# Patient Record
Sex: Female | Born: 1945 | Race: White | Hispanic: No | Marital: Married | Smoking: Never smoker
Health system: Southern US, Academic
[De-identification: ages and names within clinical notes are randomized; demographics above are authoritative.]

## PROBLEM LIST (undated history)

## (undated) ENCOUNTER — Ambulatory Visit: Admission: RE | Admitting: Gastroenterology

## (undated) ENCOUNTER — Ambulatory Visit (HOSPITAL_COMMUNITY): Payer: Self-pay | Admitting: Emergency Medicine

## (undated) ENCOUNTER — Encounter (HOSPITAL_COMMUNITY): Admission: RE | Payer: Self-pay

## (undated) ENCOUNTER — Encounter (HOSPITAL_COMMUNITY): Payer: Self-pay

## (undated) ENCOUNTER — Inpatient Hospital Stay (HOSPITAL_COMMUNITY): Admission: RE | Payer: Medicare PPO | Admitting: Student in an Organized Health Care Education/Training Program

## (undated) DIAGNOSIS — M353 Polymyalgia rheumatica: Secondary | ICD-10-CM

## (undated) DIAGNOSIS — H269 Unspecified cataract: Secondary | ICD-10-CM

## (undated) DIAGNOSIS — J45909 Unspecified asthma, uncomplicated: Secondary | ICD-10-CM

## (undated) DIAGNOSIS — I509 Heart failure, unspecified: Secondary | ICD-10-CM

## (undated) DIAGNOSIS — M545 Low back pain, unspecified: Secondary | ICD-10-CM

## (undated) DIAGNOSIS — K449 Diaphragmatic hernia without obstruction or gangrene: Secondary | ICD-10-CM

## (undated) DIAGNOSIS — E119 Type 2 diabetes mellitus without complications: Secondary | ICD-10-CM

## (undated) DIAGNOSIS — F40240 Claustrophobia: Secondary | ICD-10-CM

## (undated) DIAGNOSIS — J449 Chronic obstructive pulmonary disease, unspecified: Secondary | ICD-10-CM

## (undated) DIAGNOSIS — I2510 Atherosclerotic heart disease of native coronary artery without angina pectoris: Secondary | ICD-10-CM

## (undated) DIAGNOSIS — I10 Essential (primary) hypertension: Secondary | ICD-10-CM

## (undated) DIAGNOSIS — M539 Dorsopathy, unspecified: Secondary | ICD-10-CM

## (undated) DIAGNOSIS — Z87448 Personal history of other diseases of urinary system: Secondary | ICD-10-CM

## (undated) DIAGNOSIS — E7800 Pure hypercholesterolemia, unspecified: Secondary | ICD-10-CM

## (undated) DIAGNOSIS — M1990 Unspecified osteoarthritis, unspecified site: Secondary | ICD-10-CM

## (undated) DIAGNOSIS — K219 Gastro-esophageal reflux disease without esophagitis: Secondary | ICD-10-CM

## (undated) DIAGNOSIS — F32A Depression, unspecified: Secondary | ICD-10-CM

## (undated) DIAGNOSIS — G8929 Other chronic pain: Secondary | ICD-10-CM

## (undated) DIAGNOSIS — F329 Major depressive disorder, single episode, unspecified: Secondary | ICD-10-CM

## (undated) DIAGNOSIS — J4489 Other specified chronic obstructive pulmonary disease: Secondary | ICD-10-CM

## (undated) DIAGNOSIS — E781 Pure hyperglyceridemia: Secondary | ICD-10-CM

## (undated) DIAGNOSIS — Z87440 Personal history of urinary (tract) infections: Secondary | ICD-10-CM

## (undated) HISTORY — DX: Pure hypercholesterolemia, unspecified: E78.00

## (undated) HISTORY — DX: Claustrophobia: F40.240

## (undated) HISTORY — DX: Other specified chronic obstructive pulmonary disease: J44.89

## (undated) HISTORY — PX: ESOPHAGOGASTRODUODENOSCOPY: SHX1529

## (undated) HISTORY — DX: Low back pain, unspecified: M54.50

## (undated) HISTORY — PX: HX HYSTERECTOMY: SHX81

## (undated) HISTORY — DX: Unspecified cataract: H26.9

## (undated) HISTORY — PX: COLONOSCOPY: WVUENDOPRO10

## (undated) HISTORY — DX: Essential (primary) hypertension: I10

## (undated) HISTORY — PX: HX CARPAL TUNNEL RELEASE: SHX101

## (undated) HISTORY — DX: Heart failure, unspecified: I50.9

## (undated) HISTORY — DX: Other chronic pain: G89.29

## (undated) HISTORY — PX: HX GALL BLADDER SURGERY/CHOLE: SHX55

## (undated) HISTORY — DX: Atherosclerotic heart disease of native coronary artery without angina pectoris: I25.10

## (undated) HISTORY — PX: HX TUBAL LIGATION: SHX77

## (undated) HISTORY — DX: Pure hyperglyceridemia: E78.1

## (undated) HISTORY — PX: HX EXPOSURE TO METAL SHAVINGS: 2100001401

## (undated) HISTORY — DX: Chronic obstructive pulmonary disease, unspecified (CMS HCC): J44.9

## (undated) HISTORY — DX: Type 2 diabetes mellitus without complications: E11.9

## (undated) HISTORY — PX: CATARACT EXTRACTION, BILATERAL: SHX1313

## (undated) HISTORY — PX: HX HEART CATHETERIZATION: SHX148

## (undated) HISTORY — PX: HX TAH AND BSO: SHX83

## (undated) HISTORY — PX: HX OOPHORECTOMY: SHX86

## (undated) HISTORY — PX: HX VEIN STRIPPING: SHX48

## (undated) SURGERY — COLONOSCOPY WITH HOT BIOPSY
Anesthesia: Monitor Anesthesia Care

## (undated) SURGERY — PAIN SERVICE BLOCK INJECTION CAUDAL EPIDURAL WITH IMAGING
Anesthesia: Local (Nurse-Monitored) | Laterality: Right

## (undated) SURGERY — ENDOSCOPIC U/S UPPER
Anesthesia: Monitor Anesthesia Care

---
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
## 2004-01-14 ENCOUNTER — Inpatient Hospital Stay (HOSPITAL_COMMUNITY): Payer: Self-pay

## 2004-01-16 ENCOUNTER — Encounter (FREE_STANDING_LABORATORY_FACILITY): Payer: Self-pay | Admitting: Pathology

## 2011-10-28 ENCOUNTER — Telehealth (INDEPENDENT_AMBULATORY_CARE_PROVIDER_SITE_OTHER): Payer: Self-pay | Admitting: Family Medicine

## 2011-10-28 NOTE — Telephone Encounter (Signed)
She wants to go see Sherrell Puller for her feet [phone] he is at [employer]

## 2012-02-18 ENCOUNTER — Ambulatory Visit (HOSPITAL_COMMUNITY): Payer: Self-pay

## 2012-07-04 IMAGING — CR DG CHEST 2V
2 series · 2 of 2 positions shown · non-contrast
Comparison: None.

CLINICAL DATA: Cough for 2 days with difficulty breathing.

CHEST - 2 VIEW

[PA]
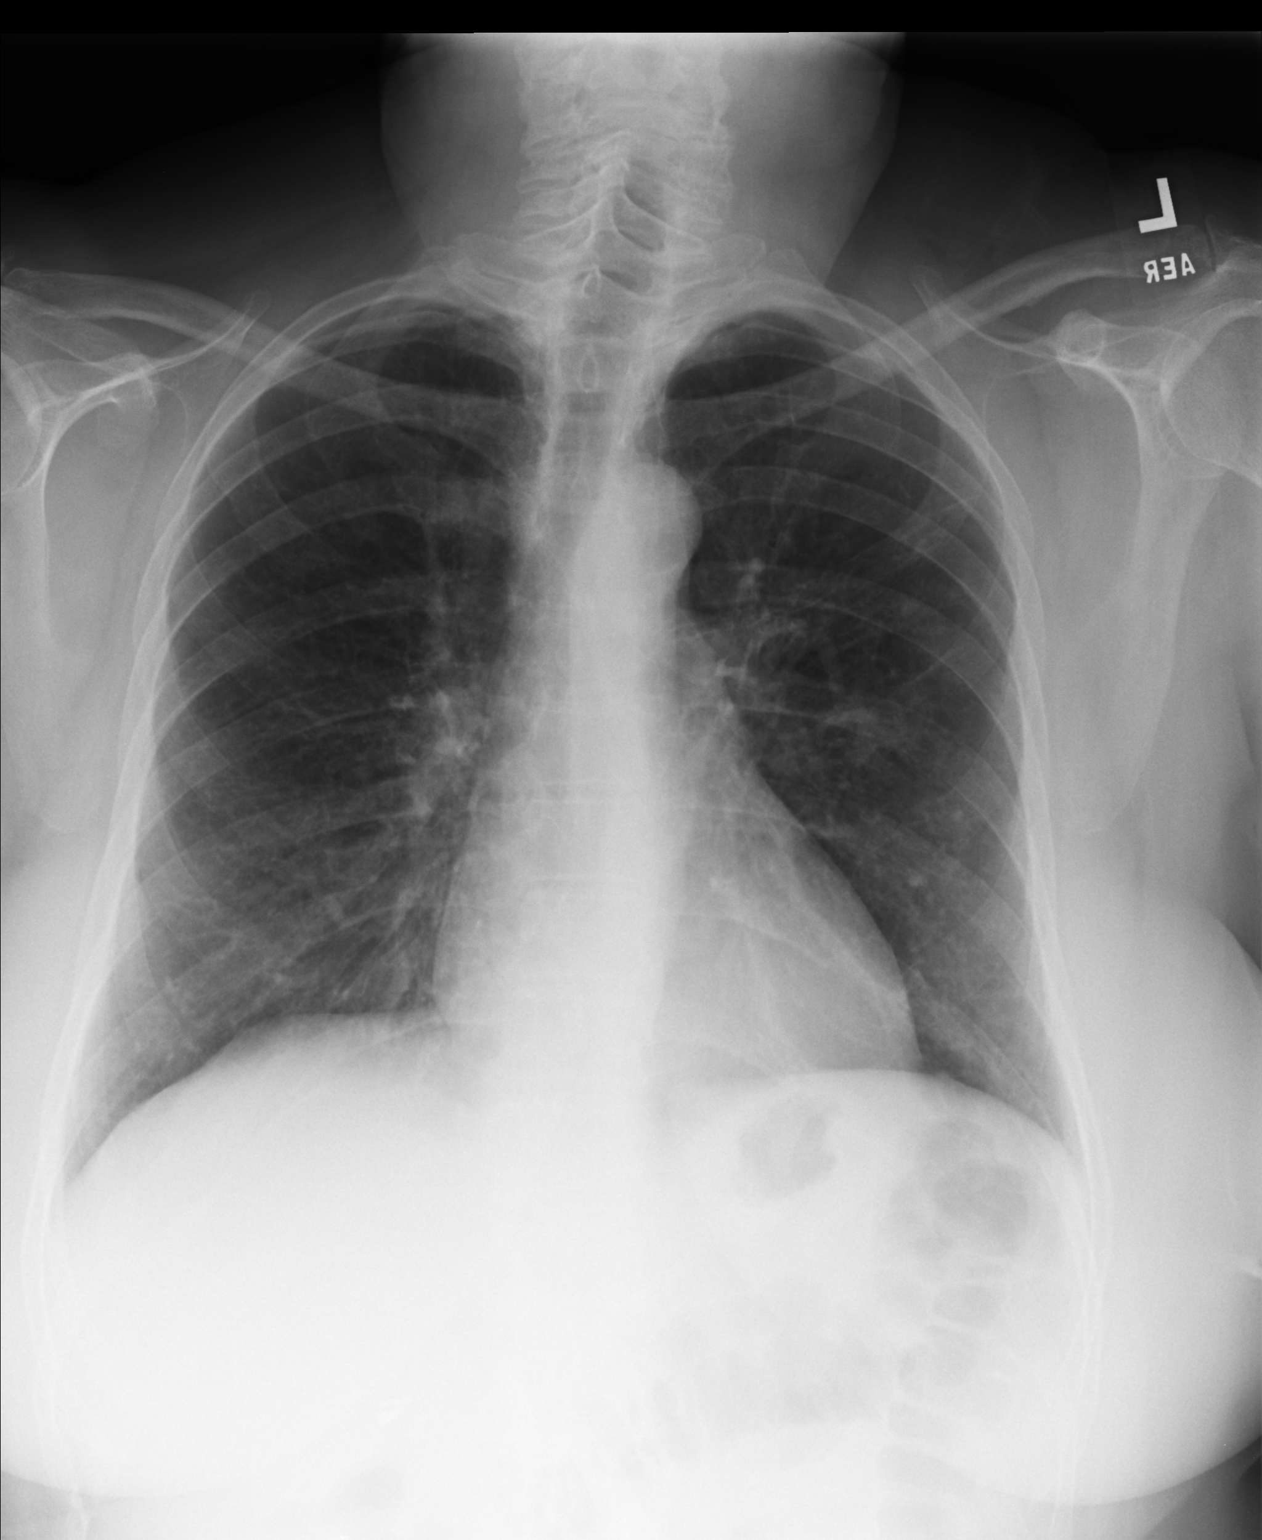

[lateral]
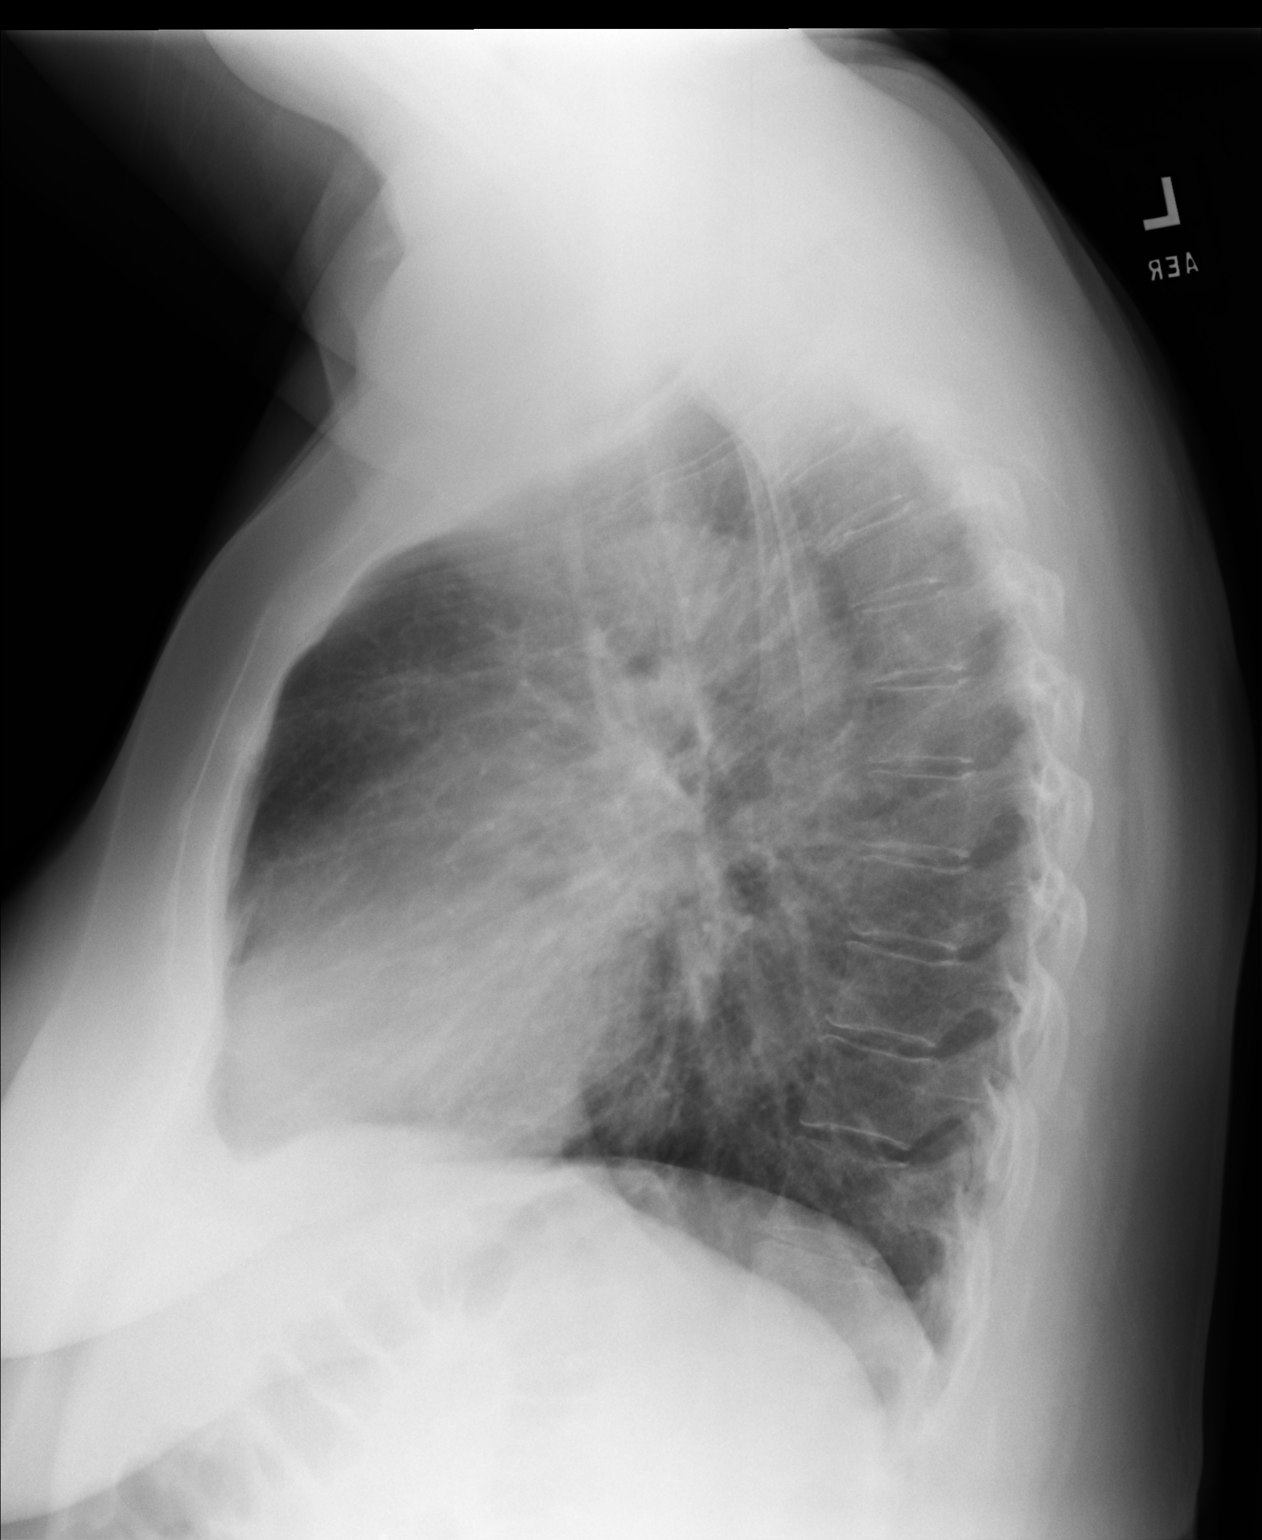

[2 of 2 positions shown; findings below may reference images not displayed]

FINDINGS: The heart size and mediastinal contours are normal.
There is a questionable ill-defined nodular density in the left
perihilar region, only seen on the frontal examination.  The right
lung is clear.  There is no pleural effusion.  The osseous
structures appear normal.
IMPRESSION: Questionable nodular density in the left perihilar region could be
due to an overlap of vascular and osseous structures.  However, a
focal infiltrate or ill-defined nodule cannot be excluded.

Short-term radiographic followup is recommended to assess for the
persistence of this finding.

This finding was not noted on the preliminary report by Dr. Manou.
These results will be called to the ordering clinician or
representative by the Radiologist Assistant, and communication
documented in the PACS Dashboard.

## 2014-08-24 ENCOUNTER — Encounter (INDEPENDENT_AMBULATORY_CARE_PROVIDER_SITE_OTHER): Payer: Self-pay | Admitting: Neurological Surgery

## 2014-08-24 ENCOUNTER — Ambulatory Visit (INDEPENDENT_AMBULATORY_CARE_PROVIDER_SITE_OTHER): Payer: BC Managed Care – PPO | Admitting: Neurological Surgery

## 2014-08-24 VITALS — BP 118/64 | HR 68 | Temp 97.7°F | Resp 16 | Ht 65.0 in | Wt 210.8 lb

## 2014-08-24 DIAGNOSIS — M48061 Spinal stenosis, lumbar region without neurogenic claudication: Principal | ICD-10-CM

## 2014-08-24 NOTE — H&P (Addendum)
[facility] of Neurosurgery  New Outpatient/Consult    Percell Locus  Date of Service: 08/24/2014  Referring physician: Jobe Marker, DO  [facility]  [address]  [place], [place] [address]      Chief Complaint:   Chief Complaint   Patient presents with    Lower Back Pain    Right Leg Pain     History is provided by patient    History of Present Illness  [name] is a pleasant 68 y.o. female with a past medical history of COPD, HLD, carpal tunnel release and h/o left leg vein stripping who presents today with complaints of chronic lumbar pain for 25 years. She was also having radicular pain in her right lower extremity in a L4 distribution more recently which prompted the MRI. She states since obtaining the MRI in July 2015, her pain has improved. She reports minimal low back pain today. Reports occasional leg pain at night R > L.  Denies any current radicular symptoms. Prior to July her symptoms were consistent with neurogenic claudication. She gives a remote history of PT and 1 injection in her spine at the [facility] clinic 25 years ago. None since. Pertinent negatives include bowel/bladder incontinence, saddle anesthesia and gait instability.     Past History  Current Outpatient Prescriptions   Medication Sig    FLUoxetine (PROZAC) 20 mg Oral Capsule Take 20 mg by mouth Once a day    fluticasone-salmeterol (ADVAIR DISKUS) 500-50 mcg/dose Inhalation Disk with Device oral diskus inhaler Take 1 INHALATION by inhalation Twice daily    OMEPRAZOLE ORAL Take by mouth    Pravastatin (PRAVACHOL) 20 mg Oral Tablet Take 20 mg by mouth Every evening    Prednisone 5 mg Oral Tablets, Dose Pack Take by mouth    UBIDECARENONE (COQ-10 ORAL) Take by mouth     No Known Allergies  Past Medical History   Diagnosis Date    Chronic bronchitis with emphysema     Claustrophobia      Past Surgical History   Procedure Laterality Date    Hx hysterectomy      Hx carpal tunnel release      Hx tubal  ligation      Hx gall bladder surgery/chole      Hx vein stripping       Left leg    Hx exposure to metal shavings         Family History  Family History:  Family History   Problem Relation Age of Onset    Diabetes Multiple family members     Hypertension Multiple family members            Social History    History     Social History    Marital Status: Married     Spouse Name: N/A     Number of Children: N/A    Years of Education: N/A     Social History Main Topics    Smoking status: Never Smoker     Smokeless tobacco: Never Used    Alcohol Use: No    Drug Use: No    Sexual Activity: Not on file     Other Topics Concern    Right Hand Dominant Yes     Social History Narrative       Review of Systems:  +LBP. Negative for myalgias, headaches, rhinorrhea, fever, weight loss, chest pain, shortness of breath,syncope, abdominal pain, bowel or bladder incontinence, weakness and gait disturbance.  All other systems reviewed are negative.     Physical Examination:   BP 118/64 mmHg   Pulse 68   Temp(Src) 36.5 C (97.7 F) (Tympanic)   Resp 16   Ht 1.651 m ( )   Wt 95.618 kg (210 lb 12.8 oz)   BMI 35.08 kg/m2  General: Resting in a seated position, in no acute distress. No pain behavior, symptom magnification, or drug seeking behavior. Speech is fluent and affect is normal.   Skin: No abnormalities of the skin. It is not clammy, cyanotic, mottled, atrophic or red.   HEENT: Normocephalic, atraumatic. PERRLA. EOMI. Sclera non-icteric. Ears appear normal.Oropharynx is clear.   Neck: No lesions, no JVD, no pathology.   Cardiovascular:  Radial pulses are +2 and symmetric.   Respiratory: Symmetric rise and fall of chest. No accessory muscle use.   Abdomen: Normoactive bowel sounds x4. Soft, non-tender, non-distended.   Musculoskeletal: Negative Patrick's sign bilaterally. Negative SLR bilaterally. No focal muscle atrophy. Negative Tinel's sign.  Negative Lhermitte's sign. + greater trochanteric tenderness on the  right.   Neuro: Alert &  Oriented x 4. Cranial nerves 2-12 are grossly intact.  Motor examination is 5/5 in bilateral upper extremities and 5/5 in bilateral lower extremities.  DTR are 2+ and symmetric in upper and lower extremities with downgoing toes. No clonus. Negative Hoffman bilaterally.  Gait is normal.  Sensation is intact.     Imaging:  I reviewed the film myself and the radiology report, my interpretation is as follows: MRI Lumbar 07/12/14- marked degenerative stenosis at L4-L5 with grade 1 spondylolisthesis. Mild left lateral recess stenosis at L5-S1 but no severe central canal stenosis at that level.     Assessment:    ICD-9-CM   1. 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 Severe stenosis at L4-L5 but no current symptoms of neurogenic claudication.     2. ? Right greater trochanteric bursitis as well. She has tenderness over right greater trochanter.     Plan:  Patient can follow up as needed. Her lumbar pain has improved. Her symptoms of neurogenic claudication have improved. She will call for follow up.       Patient was evaluated with Dr. Idolina Primer whom recommends the plan above.    Domingo Pulse, PA 08/24/2014, 16:32    I personally saw and evaluated the patient. See mid-level's note for additional details. My findings/particpation are doing well at this time. Follow-up prn.    Debbra Riding, MD 09/12/2014, 10:52

## 2016-02-20 DIAGNOSIS — J9601 Acute respiratory failure with hypoxia: Secondary | ICD-10-CM | POA: Insufficient documentation

## 2016-02-20 DIAGNOSIS — K219 Gastro-esophageal reflux disease without esophagitis: Secondary | ICD-10-CM | POA: Insufficient documentation

## 2016-02-20 DIAGNOSIS — E669 Obesity, unspecified: Secondary | ICD-10-CM | POA: Insufficient documentation

## 2016-02-20 DIAGNOSIS — I509 Heart failure, unspecified: Secondary | ICD-10-CM | POA: Insufficient documentation

## 2016-03-21 DIAGNOSIS — I420 Dilated cardiomyopathy: Secondary | ICD-10-CM | POA: Insufficient documentation

## 2016-03-21 DIAGNOSIS — I5022 Chronic systolic (congestive) heart failure: Secondary | ICD-10-CM | POA: Insufficient documentation

## 2017-12-08 ENCOUNTER — Ambulatory Visit (HOSPITAL_COMMUNITY): Payer: Self-pay | Admitting: Physician Assistant

## 2018-06-09 ENCOUNTER — Other Ambulatory Visit (INDEPENDENT_AMBULATORY_CARE_PROVIDER_SITE_OTHER): Payer: Self-pay | Admitting: Family Medicine

## 2018-06-19 ENCOUNTER — Encounter (HOSPITAL_COMMUNITY): Payer: Self-pay | Admitting: Family Medicine

## 2018-06-19 DIAGNOSIS — M545 Low back pain, unspecified: Secondary | ICD-10-CM

## 2018-06-19 DIAGNOSIS — G8929 Other chronic pain: Secondary | ICD-10-CM | POA: Insufficient documentation

## 2018-06-19 DIAGNOSIS — I509 Heart failure, unspecified: Secondary | ICD-10-CM

## 2018-06-19 DIAGNOSIS — I2510 Atherosclerotic heart disease of native coronary artery without angina pectoris: Secondary | ICD-10-CM

## 2018-06-19 DIAGNOSIS — N1831 Chronic kidney disease, stage 3a (CMS HCC): Secondary | ICD-10-CM | POA: Insufficient documentation

## 2018-06-19 DIAGNOSIS — E119 Type 2 diabetes mellitus without complications: Secondary | ICD-10-CM

## 2018-06-19 DIAGNOSIS — E781 Pure hyperglyceridemia: Secondary | ICD-10-CM

## 2018-06-21 ENCOUNTER — Other Ambulatory Visit (INDEPENDENT_AMBULATORY_CARE_PROVIDER_SITE_OTHER): Payer: Self-pay | Admitting: Family Medicine

## 2018-06-23 ENCOUNTER — Encounter (INDEPENDENT_AMBULATORY_CARE_PROVIDER_SITE_OTHER): Payer: Self-pay | Admitting: Family

## 2018-06-23 ENCOUNTER — Ambulatory Visit: Payer: Medicare PPO | Attending: Family | Admitting: Family

## 2018-06-23 VITALS — BP 124/62 | HR 82 | Temp 98.0°F | Resp 18 | Ht 65.0 in | Wt 196.0 lb

## 2018-06-23 DIAGNOSIS — R300 Dysuria: Secondary | ICD-10-CM

## 2018-06-23 DIAGNOSIS — N390 Urinary tract infection, site not specified: Secondary | ICD-10-CM | POA: Insufficient documentation

## 2018-06-23 MED ORDER — PHENAZOPYRIDINE 200 MG TABLET
200.0000 mg | ORAL_TABLET | Freq: Two times a day (BID) | ORAL | 0 refills | Status: DC | PRN
Start: 2018-06-23 — End: 2018-07-28

## 2018-06-23 MED ORDER — CIPROFLOXACIN 500 MG TABLET
500.0000 mg | ORAL_TABLET | Freq: Two times a day (BID) | ORAL | 0 refills | Status: AC
Start: 2018-06-23 — End: 2018-07-03

## 2018-06-23 NOTE — Progress Notes (Signed)
[facility]  [address]  [place], [place] [address]  P: [phone]     Name: Joanna Black MRN:  [id number]   Date: 06/23/2018 Age: 72 y.o.      Chief Complaint: Frequent Urination (complaints of burning with urination, frequency) and Pain on Urination    History of Present Illness  C/o frequency, dysuria, and oliguria with voiding; s/s progressive x 1 wk    Past Medical History  Past Medical History:   Diagnosis Date   . CAD (coronary artery disease)     mild   . CHF (congestive heart failure) (CMS HCC)    . Chronic bronchitis with emphysema (CMS HCC)    . Chronic low back pain    . Claustrophobia    . Diabetes (CMS HCC)    . Hypercholesterolemia    . Hypertension    . Hypertriglyceridemia         Past Surgical History:   Procedure Laterality Date   . COLONOSCOPY     . ESOPHAGOGASTRODUODENOSCOPY     . HX CARPAL TUNNEL RELEASE     . HX CHOLECYSTECTOMY     . HX EXPOSURE TO METAL SHAVINGS     . HX HEART CATHETERIZATION     . HX HYSTERECTOMY     . HX TAH AND BSO     . HX TUBAL LIGATION     . HX VEIN STRIPPING      Left leg            Current Outpatient Medications   Medication Sig   . albuterol sulfate (PROVENTIL OR VENTOLIN OR PROAIR) 90 mcg/actuation Inhalation HFA Aerosol Inhaler Take 2 Puffs by inhalation Every 6 hours as needed   . amLODIPine (NORVASC) 5 mg Oral Tablet Take 5 mg by mouth Once a day   . cyclobenzaprine (FLEXERIL) 5 mg Oral Tablet Take 1 Tab (5 mg total) by mouth Three times a day for 30 days   . FLUoxetine (PROZAC) 20 mg Oral Capsule Take 20 mg by mouth Once a day   . metFORMIN (GLUCOPHAGE) 500 mg Oral Tablet TAKE 1 TABLET TWICE DAILY   . pantoprazole (PROTONIX) 40 mg Oral Tablet, Delayed Release (E.C.) TAKE 1 TABLET DAILY   . spironolactone (ALDACTONE) 50 mg Oral Tablet TAKE 1 TABLET DAILY     Allergies   Allergen Reactions   . Augmentin [Amoxicillin-Pot Clavulanate] Nausea/ Vomiting   . Statins-Hmg-Coa Reductase Inhibitors Myalgia     Family History  Family Medical History:      Problem Relation (Age of Onset)    Bone cancer Father    Diabetes Multiple family members    Hypertension (High Blood Pressure) Multiple family members    No Known Problems Mother    Stomach Cancer Paternal Grandmother            Social History  Social History     Socioeconomic History   . Marital status: Married     Spouse name: Not on file   . Number of children: Not on file   . Years of education: Not on file   . Highest education level: Not on file   Occupational History   . Not on file   Social Needs   . Financial resource strain: Not on file   . Food insecurity:     Worry: Not on file     Inability: Not on file   . Transportation needs:     Medical: Not on file  Non-medical: Not on file   Tobacco Use   . Smoking status: Never Smoker   . Smokeless tobacco: Never Used   Substance and Sexual Activity   . Alcohol use: No     Alcohol/week: 0.0 oz   . Drug use: No   . Sexual activity: Not Currently     Partners: Male   Lifestyle   . Physical activity:     Days per week: 0 days     Minutes per session: 0 min   . Stress: To some extent   Relationships   . Social connections:     Talks on phone: More than three times a week     Gets together: More than three times a week     Attends religious service: Never     Active member of club or organization: No     Attends meetings of clubs or organizations: Never     Relationship status: Married   . Intimate partner violence:     Fear of current or ex partner: No     Emotionally abused: No     Physically abused: No     Forced sexual activity: No   Other Topics Concern   . Uses Cane Not Asked   . Uses walker Not Asked   . Uses wheelchair Not Asked   . Right hand dominant Yes   . Left hand dominant Not Asked   . Ambidextrous Not Asked   . Shift Work Not Asked   . Unusual Sleep-Wake Schedule Not Asked   Social History Narrative   . Not on file     Review of Systems  Other than ROS in the HPI, all other systems were negative.    Examination:  BP 124/62 (Site: Left, Patient  Position: Sitting, Cuff Size: Adult)   Pulse 82   Temp 36.7 C (98 F) (Oral)   Resp 18   Ht 1.651 m (5\' 5" )   Wt 88.9 kg (196 lb)   SpO2 95%   BMI 32.62 kg/m       General: Alert and oriented to person place and time.   Eyes: Pupils equal and round, reactive to light and accomodation.   HENT: atraumatic, mucous membranes moist., Normocephalic.   Neck: no thyromegaly and supple, symmetrical, trachea midline  Lungs: non-labored   Cardiovascular: regular rate   Abd: + suprpubic tenderness  Extremities: extremities normal, atraumatic, no cyanosis; active ROM  Skin: Skin warm and dry and No rashes  Neurologic: Grossly normal, CN II - XII grossly intact , Motor: gross and fine motor intact, Gait: independent ambulation  Lymphatics: No lymphadenopathy  Psychiatric: Normal affect, behavior is appropriate    Data reviewed:    Health Maintenance  Health Maintenance   Topic Date Due   . Adult Tdap-Td (1 - Tdap) 08/08/1964   . Hepatitis C screening antibody  08/08/1990   . Mammography  08/09/1995   . Colonoscopy  08/09/1995   . Osteoporosis screening  08/08/2010   . Shingles Vaccine (2 of 3) 12/16/2013   . Annual Wellness Exam  06/09/2018   . Influenza Vaccine (1) 08/23/2018   . Depression Screening  06/24/2019   . Pneumococcal 65+ Years Low Risk  Completed       Assessment and Plan  Joanna Black was seen today for frequent urination and pain on urination.    Diagnoses and all orders for this visit:    UTI (urinary tract infection)  -     Urine Culture; Future  -  Urine Culture    Dysuria    Other orders  -     Discontinue: phenazopyridine (PYRIDIUM) 200 mg Oral Tablet; Take 1 Tab (200 mg total) by mouth Twice per day as needed for Pain For urine pain  -     ciprofloxacin HCl (CIPRO) 500 mg Oral Tablet; Take 1 Tab (500 mg total) by mouth Twice daily for 10 days    if antibx isnt helping after 2 days call will change to macrobid     .Marland Kitchen.Return if symptoms worsen or fail to improve.     Colgate-PalmoliveBethany Chelse Matas, CFNP

## 2018-06-23 NOTE — Nursing Note (Signed)
06/23/18 1200   Urine test  (Siemens Multistix 10 SG)   Time collected 1213   Color Yellow   Clarity Cloudy   Glucose Negative   Bilirubin Negative   Ketones Negative   Urine Specific Gravity 1.020   Blood (urine) Moderate (2+)   pH 5.0   Protein (!) 1+ (30mg /dL)   Urobilinogen 0.2mg /dL (Normal)   Nitrite Negative   Leukocytes (!) 3+   Performed Status Automated   Lot # [id number]   Expiration Date 04/22/19   Initials AC

## 2018-06-24 ENCOUNTER — Encounter (INDEPENDENT_AMBULATORY_CARE_PROVIDER_SITE_OTHER): Payer: Self-pay | Admitting: Family Medicine

## 2018-06-25 LAB — URINE CULTURE: URINE CULTURE: 75000 — AB

## 2018-06-26 ENCOUNTER — Telehealth (INDEPENDENT_AMBULATORY_CARE_PROVIDER_SITE_OTHER): Payer: Self-pay | Admitting: Family

## 2018-06-26 ENCOUNTER — Other Ambulatory Visit (INDEPENDENT_AMBULATORY_CARE_PROVIDER_SITE_OTHER): Payer: Self-pay | Admitting: Family

## 2018-06-26 DIAGNOSIS — N390 Urinary tract infection, site not specified: Secondary | ICD-10-CM

## 2018-06-26 MED ORDER — NITROFURANTOIN MONOHYDRATE/MACROCRYSTALS 100 MG CAPSULE
100.00 mg | ORAL_CAPSULE | Freq: Two times a day (BID) | ORAL | 0 refills | Status: AC
Start: 2018-06-26 — End: 2018-07-03

## 2018-07-16 MED ORDER — ACETAMINOPHEN 300 MG-CODEINE 30 MG TABLET
1.00 | ORAL_TABLET | ORAL | Status: DC | PRN
Start: ? — End: 2018-07-16

## 2018-07-16 MED ORDER — DEXTROSE 50 % IN WATER (D50W) INTRAVENOUS SYRINGE
12.00 g | INJECTION | Freq: Every morning | INTRAVENOUS | Status: DC
Start: ? — End: 2018-07-16

## 2018-07-16 MED ORDER — BISACODYL 5 MG TABLET,DELAYED RELEASE
10.00 mg | DELAYED_RELEASE_TABLET | ORAL | Status: DC
Start: ? — End: 2018-07-16

## 2018-07-16 MED ORDER — PANTOPRAZOLE 40 MG TABLET,DELAYED RELEASE
40.00 mg | DELAYED_RELEASE_TABLET | ORAL | Status: DC
Start: 2018-07-17 — End: 2018-07-16

## 2018-07-16 MED ORDER — ALUMINUM-MAG HYDROXIDE-SIMETHICONE 200 MG-200 MG-20 MG/5 ML ORAL SUSP
30.00 mL | ORAL | Status: DC | PRN
Start: ? — End: 2018-07-16

## 2018-07-16 MED ORDER — DEXTROSE 40 % ORAL GEL
15.00 g | Freq: Every morning | ORAL | Status: DC
Start: ? — End: 2018-07-16

## 2018-07-16 MED ORDER — ALBUTEROL SULFATE HFA 90 MCG/ACTUATION AEROSOL INHALER
2.00 | INHALATION_SPRAY | RESPIRATORY_TRACT | Status: DC
Start: ? — End: 2018-07-16

## 2018-07-16 MED ORDER — ENOXAPARIN 40 MG/0.4 ML SUBCUTANEOUS SYRINGE
40.00 mg | INJECTION | SUBCUTANEOUS | Status: DC
Start: 2018-07-16 — End: 2018-07-16

## 2018-07-28 ENCOUNTER — Ambulatory Visit: Payer: Medicare PPO | Attending: Family Medicine | Admitting: Family Medicine

## 2018-07-28 ENCOUNTER — Ambulatory Visit (HOSPITAL_COMMUNITY)
Admission: RE | Admit: 2018-07-28 | Discharge: 2018-07-28 | Disposition: A | Discharge status: Home / Self Care | Payer: Medicare PPO | Source: Ambulatory Visit

## 2018-07-28 ENCOUNTER — Encounter (INDEPENDENT_AMBULATORY_CARE_PROVIDER_SITE_OTHER): Payer: Self-pay

## 2018-07-28 ENCOUNTER — Ambulatory Visit
Admission: RE | Admit: 2018-07-28 | Discharge: 2018-07-28 | Disposition: A | Discharge status: Home / Self Care | Payer: Medicare PPO | Source: Ambulatory Visit | Attending: Family Medicine | Admitting: Family Medicine

## 2018-07-28 ENCOUNTER — Encounter (INDEPENDENT_AMBULATORY_CARE_PROVIDER_SITE_OTHER): Payer: Self-pay | Admitting: Family Medicine

## 2018-07-28 VITALS — BP 118/62 | HR 86 | Temp 98.7°F | Resp 18 | Ht 65.0 in | Wt 197.0 lb

## 2018-07-28 DIAGNOSIS — M545 Low back pain, unspecified: Secondary | ICD-10-CM

## 2018-07-28 DIAGNOSIS — M546 Pain in thoracic spine: Secondary | ICD-10-CM | POA: Insufficient documentation

## 2018-07-28 DIAGNOSIS — M542 Cervicalgia: Secondary | ICD-10-CM | POA: Insufficient documentation

## 2018-07-28 DIAGNOSIS — G8929 Other chronic pain: Secondary | ICD-10-CM

## 2018-07-28 DIAGNOSIS — R252 Cramp and spasm: Secondary | ICD-10-CM | POA: Insufficient documentation

## 2018-07-28 DIAGNOSIS — M47892 Other spondylosis, cervical region: Secondary | ICD-10-CM | POA: Insufficient documentation

## 2018-07-28 DIAGNOSIS — M4316 Spondylolisthesis, lumbar region: Secondary | ICD-10-CM | POA: Insufficient documentation

## 2018-07-28 DIAGNOSIS — I5042 Chronic combined systolic (congestive) and diastolic (congestive) heart failure: Secondary | ICD-10-CM | POA: Insufficient documentation

## 2018-07-28 MED ORDER — CYCLOBENZAPRINE 5 MG TABLET
5.00 mg | ORAL_TABLET | Freq: Three times a day (TID) | ORAL | 0 refills | Status: AC
Start: 2018-07-28 — End: 2018-08-27

## 2018-07-28 NOTE — Patient Instructions (Signed)
Muscle Spasm  A muscle spasm (also called a cramp) is an involuntary muscle contraction. The muscle tightens quickly and strongly. A hard lump may form in the muscle. Muscle spasms are very painful. Here's how to treat and prevent muscle spasms.    What causes muscles to spasm?  Often, the cause of a muscle spasm is not known.Muscle spasm is due to irritation of muscle fibers. Some things can make a muscle spasm more likely. These include:   Injury   Heavy exercise   Overtired muscles   A muscle held in one position for a long time   Dehydration   Low levels of certain minerals in the body   Certain medicines, such as diuretics or water pills   Certain medical conditions, such as kidney failure or diabetes   Pregnancy  Stopping a muscle spasm  Muscle spasms often come and go quickly. When a muscle goes into spasm, very gently stretch and massage the muscle. This may help calm the muscle fibers. Then rest the muscle.  Preventing muscle spasms  Although there is little or no evidence that staying hydrated, taking certain vitamins or minerals, or stretching works to prevent cramps, these measures may help and have other benefits. Talk to yourhealthcare providerabout steps to take to prevent muscle spasms. Try to:   Drink enough fluids to prevent dehydration, especially when you exercise.   Take vitamin or mineral supplements.   Get regular exercise.   Stretch regularly, especially before exercise.   Limit caffeine and smoking.   Take a prescription muscle relaxant.  When to call your doctor  Call your doctor if you have any of the following:   Severe cramping   Cramping that lasts a long time, does not go away with stretching, or keeps coming back   Pain, tingling, or weakness in the arms or legs   Pain that wakes you up at night  Date Last Reviewed: 04/22/2017   2000-2019 [employer] [address], [place] [address]. All rights reserved. This information is not intended  as a substitute for professional medical care. Always follow your healthcare professional's instructions.

## 2018-07-28 NOTE — Assessment & Plan Note (Signed)
Stable.  I advised her to decrease her spironolactone from 50 mg p.o. Q.day to 25 mg p.o. Q.day in an effort to see if this might help with some of the cramping that she is describing.  However I am not sure that it is coming from and electrolyte problem it may be a nerve problem.

## 2018-07-28 NOTE — H&P (Signed)
[facility]  [address]  [place] [address]    History and Physical     Name: Joanna Black MRN:  [id number]   Date: 07/28/2018 Age: 72 y.o.       Reason for Visit: Hypertension (patient presents for routine check up)    History of Present Illness   Joanna Black is a 72 y.o. year old female who comes to clinic for follow-up for cervical thoracic and lower back pain with muscle cramps.  She was recently in [place] visiting her son when she had some left sided pain that was in her shoulders also in her rib cage down into her lower back as well as some cramping and pain in her lower legs.  She has a history of CHF so she went to the ER there and she was worked up for the chest pain which was negative.  She also says she was not having any problems with her CHF at that time.  She has had some cramping before but this was extensive.  It has decreased and the pain and cramps are barely there now.  She has just been taking some over-the-counter Tylenol and NSAIDs.  She is wondering what is going on.  She also has S a history of back pain but it has not been increased until this episode for quite some time.    Patient Active Problem List    Diagnosis   . Cervical pain (neck)   . Thoracic back pain   . Chronic bilateral low back pain   . Muscle cramps   . Hypertriglyceridemia   . CAD (coronary artery disease)   . Chronic low back pain   . CHF (congestive heart failure) (CMS HCC)   . Diabetes (CMS [facility])     Past Medical History:   Diagnosis Date   . CAD (coronary artery disease)     mild   . CHF (congestive heart failure) (CMS HCC)    . Chronic bronchitis with emphysema (CMS HCC)    . Chronic low back pain    . Claustrophobia    . Diabetes (CMS HCC)    . Hypercholesterolemia    . Hypertension    . Hypertriglyceridemia          Past Surgical History:   Procedure Laterality Date   . COLONOSCOPY     . ESOPHAGOGASTRODUODENOSCOPY     . HX CARPAL TUNNEL RELEASE     . HX CHOLECYSTECTOMY     .  HX EXPOSURE TO METAL SHAVINGS     . HX HEART CATHETERIZATION     . HX HYSTERECTOMY     . HX TAH AND BSO     . HX TUBAL LIGATION     . HX VEIN STRIPPING      Left leg         Current Outpatient Medications   Medication Sig   . albuterol sulfate (PROVENTIL OR VENTOLIN OR PROAIR) 90 mcg/actuation Inhalation HFA Aerosol Inhaler Take 2 Puffs by inhalation Every 6 hours as needed   . amLODIPine (NORVASC) 5 mg Oral Tablet Take 5 mg by mouth Once a day   . cyclobenzaprine (FLEXERIL) 5 mg Oral Tablet Take 1 Tab (5 mg total) by mouth Three times a day for 30 days   . FLUoxetine (PROZAC) 20 mg Oral Capsule Take 20 mg by mouth Once a day   . metFORMIN (GLUCOPHAGE) 500 mg Oral Tablet TAKE 1 TABLET TWICE DAILY   .  pantoprazole (PROTONIX) 40 mg Oral Tablet, Delayed Release (E.C.) TAKE 1 TABLET DAILY   . spironolactone (ALDACTONE) 50 mg Oral Tablet TAKE 1 TABLET DAILY     Allergies   Allergen Reactions   . Augmentin [Amoxicillin-Pot Clavulanate] Nausea/ Vomiting   . Statins-Hmg-Coa Reductase Inhibitors Myalgia     Family Medical History:     Problem Relation (Age of Onset)    Bone cancer Father    Diabetes Multiple family members    Hypertension (High Blood Pressure) Multiple family members    No Known Problems Mother    Stomach Cancer Paternal Grandmother            Social History     Tobacco Use   . Smoking status: Never Smoker   . Smokeless tobacco: Never Used   Substance Use Topics   . Alcohol use: No     Alcohol/week: 0.0 oz        Review of Systems  Constitutional: negative  Eyes: negative  Ears, nose, mouth, throat, and face: negative  Respiratory: negative  Cardiovascular: negative  Gastrointestinal: negative  Hematologic/lymphatic: negative  Musculoskeletal:positive for myalgias, stiff joints, neck pain and back pain  Neurological: negative  Allergic/Immunologic: negative  All other ROS Negative    Physical Examination  BP 118/62 (Site: Left, Patient Position: Sitting, Cuff Size: Adult)   Pulse 86   Temp 37.1 C (98.7  F) (Oral)   Resp 18   Ht 1.651 m (5\' 5" )   Wt 89.4 kg (197 lb)   SpO2 95%   BMI 32.78 kg/m       General:  appears in good health, mildly obese, appears stated age, no distress and vital signs reviewed and discussed with patient  Eyes:  Conjunctiva clear., Pupils equal and round, reactive to light and accomodation.   HENT:  ENMT without erythema or injection, mucous membranes moist., TM's Clear.   Neck:  no thyromegaly or lymphadenopathy  Lungs:  Clear to auscultation bilaterally.   Cardiovascular:  regular rate and rhythm, S1, S2 normal, no murmur, click, rub or gallop  Abdomen:  Soft, non-tender, Bowel sounds normal, Non-tender  Extremities:  No cyanosis or edema, extremities normal, atraumatic, no cyanosis or edema, no edema, redness or tenderness in the calves or thighs  Skin:  Skin warm and dry and Skin color, texture, turgor normal. No rashes or lesions  Neurologic:  Grossly normal, Gait is normal.  , CN II - XII grossly intact , Normal mental status, sensory, motor, cranial nerves, reflexes, coordination, and gait., Alert and oriented X 3, normal strength and tone. Normal symmetric reflexes. Normal coordination and gait  Lymphatics:  No lymphadenopathy  Musculoskeletal:  Head atraumatic and normocephalic, normal and She has mild para spinal tenderness in the cervical, thoracic and lumbar areas.  She has a slight curvature in her thoracic spine.    Data Reviewed  Labs ordered today, pending.     Assessment and Plan  Problem List Items Addressed This Visit        Cardiovascular System    CHF (congestive heart failure) (CMS HCC) - Primary     Stable.  I advised her to decrease her spironolactone from 50 mg p.o. Q.day to 25 mg p.o. Q.day in an effort to see if this might help with some of the cramping that she is describing.  However I am not sure that it is coming from and electrolyte problem it may be a nerve problem.         Relevant  Orders    CBC    Comp Metabolic Panel-Non Fasting    BNP (NT-PROBNP)          Neurologic    Cervical pain (neck)    Relevant Orders    XR CERVICAL SPINE COMPLETE (4 OR MORE VIEWS)       Musculoskeletal    Thoracic back pain    Relevant Orders    XR THORACIC SPINE    Chronic bilateral low back pain    Relevant Orders    Lumbar Spine Series Xray    Muscle cramps    Relevant Medications    cyclobenzaprine (FLEXERIL) 5 mg Oral Tablet    Other Relevant Orders    CBC    Comp Metabolic Panel-Non Fasting    Magnesium    Phosphorus        We will evaluate her spine to see if there is a cause for these problems.  Follow-up 4 weeks and p.r.n.Marland Kitchen.    Jobe MarkerStephanie Mikko Lewellen, DO

## 2018-07-29 ENCOUNTER — Other Ambulatory Visit (INDEPENDENT_AMBULATORY_CARE_PROVIDER_SITE_OTHER): Payer: Self-pay | Admitting: Family Medicine

## 2018-07-29 DIAGNOSIS — M542 Cervicalgia: Secondary | ICD-10-CM

## 2018-07-29 NOTE — Progress Notes (Signed)
Patient notified, voiced understanding.  Madalynne Gutmann Collins, LPN

## 2018-07-29 NOTE — Progress Notes (Signed)
Patient notified and agreed to MRI of both neck and lower back.  Is ok with doing neck first.  Shelly FlattenAnita Collins, LPN

## 2018-08-11 ENCOUNTER — Telehealth (INDEPENDENT_AMBULATORY_CARE_PROVIDER_SITE_OTHER): Payer: Self-pay | Admitting: Family Medicine

## 2018-08-11 DIAGNOSIS — G8929 Other chronic pain: Secondary | ICD-10-CM

## 2018-08-11 DIAGNOSIS — M546 Pain in thoracic spine: Secondary | ICD-10-CM

## 2018-08-11 DIAGNOSIS — M545 Low back pain: Secondary | ICD-10-CM

## 2018-08-11 DIAGNOSIS — M542 Cervicalgia: Secondary | ICD-10-CM

## 2018-08-11 NOTE — Telephone Encounter (Signed)
[place] [facility] [employer] [facility]  MRI C-Spine DENIED.

## 2018-08-11 NOTE — Telephone Encounter (Signed)
See note

## 2018-08-12 NOTE — Telephone Encounter (Signed)
Left message for return call.  Kenzie Flakes Collins, LPN

## 2018-08-12 NOTE — Telephone Encounter (Signed)
-----   Message from Jobe MarkerStephanie Frame, DO sent at 08/10/2018  3:22 PM EDT -----  Please call patient. Her insurance denied her MRI. I would like to send her to PT. Would she be willing to go?

## 2018-08-13 NOTE — Telephone Encounter (Signed)
Patient agreed to physical therapy.  Shelly FlattenAnita Collins, LPN

## 2018-09-02 ENCOUNTER — Encounter (INDEPENDENT_AMBULATORY_CARE_PROVIDER_SITE_OTHER): Payer: Self-pay | Admitting: Family Medicine

## 2018-11-06 ENCOUNTER — Other Ambulatory Visit (INDEPENDENT_AMBULATORY_CARE_PROVIDER_SITE_OTHER): Payer: Self-pay | Admitting: Family Medicine

## 2018-12-17 ENCOUNTER — Encounter (INDEPENDENT_AMBULATORY_CARE_PROVIDER_SITE_OTHER): Payer: Self-pay | Admitting: Family

## 2018-12-17 ENCOUNTER — Ambulatory Visit: Payer: Medicare PPO | Attending: Family | Admitting: Family

## 2018-12-17 VITALS — BP 128/78 | HR 78 | Temp 98.1°F | Resp 18 | Ht 65.0 in | Wt 203.0 lb

## 2018-12-17 DIAGNOSIS — N390 Urinary tract infection, site not specified: Secondary | ICD-10-CM | POA: Insufficient documentation

## 2018-12-17 MED ORDER — CIPROFLOXACIN 500 MG TABLET: 500 mg | Tab | Freq: Two times a day (BID) | ORAL | 0 refills | 0 days | Status: CN

## 2018-12-17 MED ORDER — CIPROFLOXACIN 500 MG TABLET
500.00 mg | ORAL_TABLET | Freq: Two times a day (BID) | ORAL | 0 refills | Status: AC
Start: 2018-12-17 — End: 2018-12-24

## 2018-12-17 NOTE — Addendum Note (Signed)
Addended byLavinia Sharps: Raedyn Klinck on: 12/17/2018 10:49 AM     Modules accepted: Orders

## 2018-12-17 NOTE — Progress Notes (Signed)
[facility]  [address]  [place], [place] [address]  P: [phone]     Name: Joanna Black MRN:  [id number]   Date: 12/17/2018 Age: 72 y.o.      Chief Complaint: Abdominal Pain (Concerns of UTI X 3 days, pressure) and Urinary Frequency    History of Present Illness:  Patient here for complaints of abdominal pain, dysuria esp towards end of void and urinary frequency.    Usually has 1-2 uti/year    Colonoscopy - 2 yrs ago with ugi and dilitation    Past Medical History  Past Medical History:   Diagnosis Date   . CAD (coronary artery disease)     mild   . CHF (congestive heart failure) (CMS HCC)    . Chronic bronchitis with emphysema (CMS HCC)    . Chronic low back pain    . Claustrophobia    . Diabetes (CMS HCC)    . Hypercholesterolemia    . Hypertension    . Hypertriglyceridemia         Past Surgical History:   Procedure Laterality Date   . COLONOSCOPY     . ESOPHAGOGASTRODUODENOSCOPY     . HX CARPAL TUNNEL RELEASE     . HX CHOLECYSTECTOMY     . HX EXPOSURE TO METAL SHAVINGS     . HX HEART CATHETERIZATION     . HX HYSTERECTOMY     . HX TAH AND BSO     . HX TUBAL LIGATION     . HX VEIN STRIPPING      Left leg            Current Outpatient Medications   Medication Sig   . albuterol sulfate (PROVENTIL OR VENTOLIN OR PROAIR) 90 mcg/actuation Inhalation HFA Aerosol Inhaler Take 2 Puffs by inhalation Every 6 hours as needed   . amLODIPine (NORVASC) 5 mg Oral Tablet Take 5 mg by mouth Once a day   . ciprofloxacin HCl (CIPRO) 500 mg Oral Tablet Take 1 Tab (500 mg total) by mouth Twice daily for 7 days   . FLUoxetine (PROZAC) 20 mg Oral Capsule TAKE ONE CAPSULE BY MOUTH EVERY DAY   . metFORMIN (GLUCOPHAGE) 500 mg Oral Tablet TAKE 1 TABLET TWICE DAILY   . multivitamin Oral Tablet Take 1 Tab by mouth   . pantoprazole (PROTONIX) 40 mg Oral Tablet, Delayed Release (E.C.) TAKE 1 TABLET DAILY   . spironolactone (ALDACTONE) 50 mg Oral Tablet TAKE 1 TABLET DAILY     Allergies   Allergen Reactions   . Augmentin  [Amoxicillin-Pot Clavulanate] Nausea/ Vomiting   . Statins-Hmg-Coa Reductase Inhibitors Myalgia     Family History  Family Medical History:     Problem Relation (Age of Onset)    Bone cancer Father    Diabetes Multiple family members    Hypertension (High Blood Pressure) Multiple family members    No Known Problems Mother    Stomach Cancer Paternal Grandmother        Social History  Social History     Socioeconomic History   . Marital status: Married     Spouse name: Joanna Black   . Number of children: 3   . Years of education: 28   . Highest education level: High school graduate   Occupational History   . Occupation: Retired   Engineer, production   . Financial resource strain: Not hard at all   . Food insecurity:     Worry: Never true  Inability: Never true   . Transportation needs:     Medical: No     Non-medical: No   Tobacco Use   . Smoking status: Never Smoker   . Smokeless tobacco: Never Used   Substance and Sexual Activity   . Alcohol use: No     Alcohol/week: 0.0 standard drinks   . Drug use: No   . Sexual activity: Not Currently     Partners: Male   Lifestyle   . Physical activity:     Days per week: 0 days     Minutes per session: 0 min   . Stress: To some extent   Relationships   . Social connections:     Talks on phone: More than three times a week     Gets together: More than three times a week     Attends religious service: Never     Active member of club or organization: No     Attends meetings of clubs or organizations: Never     Relationship status: Married   . Intimate partner violence:     Fear of current or ex partner: No     Emotionally abused: No     Physically abused: No     Forced sexual activity: No   Other Topics Concern   . Uses Cane Not Asked   . Uses walker Not Asked   . Uses wheelchair Not Asked   . Right hand dominant Yes   . Left hand dominant Not Asked   . Ambidextrous Not Asked   . Shift Work Not Asked   . Unusual Sleep-Wake Schedule Not Asked   Social History Narrative   . Not on file          Review of Systems  Other than ROS in the HPI, all other systems were negative.    Examination:  BP 128/78 (Site: Right, Patient Position: Sitting, Cuff Size: Adult)   Pulse 78   Temp 36.7 C (98.1 F) (Oral)   Resp 18   Ht 1.651 m (5\' 5" )   Wt 92.1 kg (203 lb)   SpO2 97%   Breastfeeding No   BMI 33.78 kg/m       General: Alert and oriented to person place and time.   Eyes: Pupils equal and round, reactive to light and accomodation.   HENT:  Normocephalic  Lungs: non-labored   Cardiovascular: regular rate   Abd: soft mild sp tenderness  MS: active ROM BUE and BLE  Skin: Skin warm and dry   Neurologic: gross and fine motor intact,  independent ambulation  Lymphatics: No lymphadenopathy  Psychiatric: Normal affect, behavior is appropriate    Data reviewed:   OUTPATIENT POCT RESULTS 12/17/2018   Time collected 10:10 AM   Color Yellow   Clarity Cloudy   Glucose Negative   Bilirubin Negative   Ketones Negative   Urine Specific Gravity 1.010   Blood (urine) Large (3+)   pH 6.5   Protein Negative   Urobilinogen 0.2mg /dL (Normal)   Nitrite Negative   Leukocytes 3+     Health Maintenance  Health Maintenance   Topic Date Due   . Adult Tdap-Td (1 - Tdap) 08/08/1964   . Hepatitis C screening antibody  08/08/1990   . Colonoscopy  08/09/1995   . Osteoporosis screening  08/08/2010   . Shingles Vaccine (2 of 3) 12/16/2013   . Annual Wellness Exam  06/09/2018   . Influenza Vaccine (1) 08/23/2018   . Depression Screening  06/24/2019   . Mammography  07/13/2020   . Pneumococcal 65+ Years Low Risk  Completed       Assessment and Plan  Joanna Black was seen today for abdominal pain and urinary frequency.    Diagnoses and all orders for this visit:    UTI (urinary tract infection)    Other orders  -     Discontinue: ciprofloxacin HCl (CIPRO) 500 mg Oral Tablet; Take 1 Tab (500 mg total) by mouth Twice daily for 7 days  -     ciprofloxacin HCl (CIPRO) 500 mg Oral Tablet; Take 1 Tab (500 mg total) by mouth Twice daily for 7 days          .Marland Kitchen.Return if symptoms worsen or fail to improve.       Colgate-PalmoliveBethany Ratliff, CFNP

## 2018-12-17 NOTE — Nursing Note (Signed)
12/17/18 1000   Required: Location Test Performed At:   [facility] [address] [place], [place] [id number]   Urine test  (S[employer] Multistix 10 SG)   Time collected 1010   Color Yellow   Clarity Cloudy   Glucose Negative   Bilirubin Negative   Ketones Negative   Urine Specific Gravity 1.010   Blood (urine) Large (3+)   pH 6.5   Protein Negative   Urobilinogen 0.2mg /dL (Normal)   Nitrite Negative   Leukocytes (!) 3+   Bottle Number   (S[employer] Multistix 10 SG) [id number]   Lot # [id number]   Expiration Date 01/23/20   Initials ADH, LPN

## 2019-01-18 ENCOUNTER — Other Ambulatory Visit (INDEPENDENT_AMBULATORY_CARE_PROVIDER_SITE_OTHER): Payer: Self-pay | Admitting: Family

## 2019-01-18 DIAGNOSIS — E119 Type 2 diabetes mellitus without complications: Secondary | ICD-10-CM

## 2019-01-18 MED ORDER — BLOOD SUGAR DIAGNOSTIC STRIPS
ORAL_STRIP | 5 refills | Status: DC
Start: 2019-01-18 — End: 2020-11-29

## 2019-01-18 NOTE — Telephone Encounter (Addendum)
Refill test stips for true matrix. Tests once daily send to [facility].

## 2019-01-28 ENCOUNTER — Encounter (INDEPENDENT_AMBULATORY_CARE_PROVIDER_SITE_OTHER): Payer: Self-pay | Admitting: Family

## 2019-01-28 ENCOUNTER — Other Ambulatory Visit: Payer: Self-pay

## 2019-01-28 ENCOUNTER — Ambulatory Visit: Payer: Medicare PPO | Attending: Family | Admitting: Family

## 2019-01-28 VITALS — BP 126/72 | HR 88 | Temp 98.4°F | Resp 16 | Ht 65.0 in | Wt 201.6 lb

## 2019-01-28 DIAGNOSIS — E119 Type 2 diabetes mellitus without complications: Secondary | ICD-10-CM | POA: Insufficient documentation

## 2019-01-28 DIAGNOSIS — G8929 Other chronic pain: Secondary | ICD-10-CM

## 2019-01-28 DIAGNOSIS — I5042 Chronic combined systolic (congestive) and diastolic (congestive) heart failure: Secondary | ICD-10-CM | POA: Insufficient documentation

## 2019-01-28 DIAGNOSIS — Z6833 Body mass index (BMI) 33.0-33.9, adult: Secondary | ICD-10-CM | POA: Insufficient documentation

## 2019-01-28 DIAGNOSIS — I10 Essential (primary) hypertension: Secondary | ICD-10-CM

## 2019-01-28 DIAGNOSIS — E781 Pure hyperglyceridemia: Secondary | ICD-10-CM | POA: Insufficient documentation

## 2019-01-28 DIAGNOSIS — M5442 Lumbago with sciatica, left side: Secondary | ICD-10-CM | POA: Insufficient documentation

## 2019-01-28 HISTORY — DX: Essential (primary) hypertension: I10

## 2019-01-28 MED ORDER — TIZANIDINE 4 MG TABLET: 4 mg | Tab | Freq: Three times a day (TID) | ORAL | 0 refills | 0 days | Status: AC | PRN

## 2019-01-28 NOTE — Progress Notes (Signed)
[facility]  [address]  [place], [place] [id number]  P: [phone]     Name: Joanna Black MRN:  [id number]   Date: 01/28/2019 Age: 73 y.o.      Chief Complaint: Establish Care (Patient presents to establish care; complaints of back pain)    History of Present Illness:  Patient is here for routine follow-up of chronic condition, this is the 1st time I have seen her as a primary care patient.  Pt has trouble mainly with lower back, intermittent; [facility]. Usually resolves on own. Going away end feb to April and would like muscle relaxer to help her in case back acts up.  Usually has breathing exac once a year, pt thinks more asthma r/t denies bronchitis    Diabetes - last A1C 6 months ago; had hard time taking statin other then pravachol currently on none, she is aware of diabetic guidelines that recommend statin use, she said she would think about it    CHF - has acute exacerbations, BNP can go upto 600; EF when asymptomatic 65%; takes spironolactone    Hypertension - has home cuff, runs normal; controlled Cath 02/2016 mild disease    Hypertriglyceridemia - 200, no tx    Brief recent history from several months ago  [name] is a 73 y.o. year old female who comes to clinic for follow-up for cervical thoracic and lower back pain with muscle cramps.  [place] when she had some left sided pain that was in her shoulders also in her rib cage down into her lower back as well as some cramping and pain in her lower legs.  She has a history of CHF so she went to the ER there and she was worked up for the chest pain which was negative.  She also says she was not having any problems with her CHF at that time.  She has had some cramping before but this was extensive.  It has decreased and the pain and cramps are barely there now.  She has just been taking some over-the-counter Tylenol and NSAIDs.  She is wondering what is going on.   She also has S a history of back pain but it has not been increased until this episode for quite some time.    Colonoscopy - 2018 wnl, [place]  [facility]- 07/2018  Pap/pelvic - TAH  DEXA - long ago nl  Left lower leg fracture 2014  Brother x 2 MI with bypass & stents, son stent age 74 + smoker   Mom d/c'd accident; maternal side millk diabetic   Dad d/c'd 5531yrs bone cancer    Past Medical History  Past Medical History:   Diagnosis Date   . CAD (coronary artery disease)     mild   . CHF (congestive heart failure) (CMS HCC)    . Chronic bronchitis with emphysema (CMS HCC)    . Chronic low back pain    . Claustrophobia    . Diabetes (CMS HCC)    . Essential hypertension 01/28/2019   . Hypercholesterolemia    . Hypertension    . Hypertriglyceridemia         Past Surgical History:   Procedure Laterality Date   . COLONOSCOPY     . ESOPHAGOGASTRODUODENOSCOPY     . HX CARPAL TUNNEL RELEASE     . HX CHOLECYSTECTOMY     . HX EXPOSURE TO METAL  SHAVINGS     . HX HEART CATHETERIZATION     . HX HYSTERECTOMY     . HX TAH AND BSO     . HX TUBAL LIGATION     . HX VEIN STRIPPING      Left leg            Current Outpatient Medications   Medication Sig   . albuterol sulfate (PROVENTIL OR VENTOLIN OR PROAIR) 90 mcg/actuation Inhalation HFA Aerosol Inhaler Take 2 Puffs by inhalation Every 6 hours as needed   . amLODIPine (NORVASC) 5 mg Oral Tablet Take 5 mg by mouth Once a day   . Blood Sugar Diagnostic (BLOOD GLUCOSE TEST) Strip Use to test blood glucose once daily.  Dx E11.9   . FLUoxetine (PROZAC) 20 mg Oral Capsule TAKE ONE CAPSULE BY MOUTH EVERY DAY   . metFORMIN (GLUCOPHAGE) 500 mg Oral Tablet TAKE 1 TABLET TWICE DAILY (Patient taking differently: One time )   . multivitamin Oral Tablet Take 1 Tab by mouth   . omeprazole (PRILOSEC) 40 mg Oral Capsule, Delayed Release(E.C.) Take 40 mg by mouth Once a day   . psyllium husk (METAMUCIL ORAL) Take by mouth   . spironolactone (ALDACTONE) 50 mg Oral Tablet TAKE 1 TABLET DAILY   . tiZANidine  (ZANAFLEX) 4 mg Oral Tablet Take 1 Tab (4 mg total) by mouth Three times a day as needed (back pain)     Allergies   Allergen Reactions   . Amoxicillin    . Augmentin [Amoxicillin-Pot Clavulanate] Nausea/ Vomiting   . Statins-Hmg-Coa Reductase Inhibitors Myalgia     Family History  Family Medical History:     Problem Relation (Age of Onset)    Bone cancer Father    Diabetes Multiple family members    Hypertension (High Blood Pressure) Multiple family members    No Known Problems Mother    Stomach Cancer Paternal Grandmother          Social History  Social History     Socioeconomic History   . Marital status: Married     Spouse name: Duke Salvia   . Number of children: 3   . Years of education: 50   . Highest education level: High school graduate   Occupational History   . Occupation: Retired   Engineer, production   . Financial resource strain: Not hard at all   . Food insecurity:     Worry: Never true     Inability: Never true   . Transportation needs:     Medical: No     Non-medical: No   Tobacco Use   . Smoking status: Never Smoker   . Smokeless tobacco: Never Used   Substance and Sexual Activity   . Alcohol use: No     Alcohol/week: 0.0 standard drinks   . Drug use: No   . Sexual activity: Not Currently     Partners: Male   Lifestyle   . Physical activity:     Days per week: 0 days     Minutes per session: 0 min   . Stress: To some extent   Relationships   . Social connections:     Talks on phone: More than three times a week     Gets together: More than three times a week     Attends religious service: Never     Active member of club or organization: No     Attends meetings of clubs or organizations: Never  Relationship status: Married   . Intimate partner violence:     Fear of current or ex partner: No     Emotionally abused: No     Physically abused: No     Forced sexual activity: No   Other Topics Concern   . Uses Cane Not Asked   . Uses walker Not Asked   . Uses wheelchair Not Asked   . Right hand dominant Yes   .  Left hand dominant Not Asked   . Ambidextrous Not Asked   . Shift Work Not Asked   . Unusual Sleep-Wake Schedule Not Asked   Social History Narrative   . Not on file     Review of Systems  Other than ROS in the HPI, all other systems were negative.    Examination:  BP 126/72 (Site: Left, Patient Position: Sitting, Cuff Size: Adult)   Pulse 88   Temp 36.9 C (98.4 F) (Oral)   Resp 16   Ht 1.651 m (5\' 5" )   Wt 91.4 kg (201 lb 9.6 oz)   SpO2 93%   BMI 33.55 kg/m       General: Alert and oriented to person place and time.   Eyes: Pupils equal and round, reactive to light and accomodation.   HENT:  Normocephalic  Lungs: non-labored CTAB  Cardiovascular: regular rate and occasional irregular beat, no murmur  MS: active ROM BUE and BLE  Skin: Skin warm and dry   Neurologic: gross and fine motor intact,  independent ambulation  Psychiatric: Normal affect, behavior is appropriate    Data reviewed:     Health Maintenance  Health Maintenance   Topic Date Due   . Adult Tdap-Td (1 - Tdap) 08/08/1964   . Hepatitis C screening antibody  08/08/1990   . Colonoscopy  08/09/1995   . Osteoporosis screening  08/08/2010   . Shingles Vaccine (2 of 3) 12/16/2013   . Annual Wellness Exam  06/09/2018   . Depression Screening  06/24/2019   . Mammography  07/13/2020   . Pneumococcal 65+ Years Low Risk  Completed   . Influenza Vaccine  Completed       Assessment and Plan  Lamya was seen today for establish care.    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       [employer], CFNP

## 2019-01-29 ENCOUNTER — Ambulatory Visit: Payer: Medicare PPO | Attending: Family

## 2019-01-29 DIAGNOSIS — I5042 Chronic combined systolic (congestive) and diastolic (congestive) heart failure: Secondary | ICD-10-CM | POA: Insufficient documentation

## 2019-01-29 DIAGNOSIS — E781 Pure hyperglyceridemia: Secondary | ICD-10-CM | POA: Insufficient documentation

## 2019-01-29 DIAGNOSIS — E119 Type 2 diabetes mellitus without complications: Secondary | ICD-10-CM | POA: Insufficient documentation

## 2019-01-29 DIAGNOSIS — I10 Essential (primary) hypertension: Secondary | ICD-10-CM | POA: Insufficient documentation

## 2019-01-29 LAB — COMPREHENSIVE METABOLIC PNL, FASTING
ALBUMIN: 4.5 g/dL (ref 3.9–5.0)
ALKALINE PHOSPHATASE: 105 U/L (ref 38–126)
ALT (SGPT): 15 U/L (ref 0–35)
ANION GAP: 6 mmol/L (ref 4–13)
AST (SGOT): 21 U/L (ref 14–36)
BILIRUBIN TOTAL: 0.5 mg/dL (ref 0.2–1.3)
BUN/CREA RATIO: 17 (ref 6–22)
BUN: 22 mg/dL — ABNORMAL HIGH (ref 7–17)
CALCIUM: 9.7 mg/dL (ref 8.4–10.2)
CHLORIDE: 101 mmol/L (ref 98–107)
CHLORIDE: 101 mmol/L (ref 98–107)
CO2 TOTAL: 29 mmol/L (ref 22–30)
CREATININE: 1.3 mg/dL — ABNORMAL HIGH (ref 0.70–1.20)
GLUCOSE: 155 mg/dL — ABNORMAL HIGH (ref 65–100)
POTASSIUM: 4.7 mmol/L (ref 3.5–5.0)
POTASSIUM: 4.7 mmol/L (ref 3.5–5.0)
PROTEIN TOTAL: 8.7 g/dL — ABNORMAL HIGH (ref 6.3–8.2)
SODIUM: 136 mmol/L — ABNORMAL LOW (ref 137–145)

## 2019-01-29 LAB — HGA1C (HEMOGLOBIN A1C WITH EST AVG GLUCOSE)
ESTIMATED AVERAGE GLUCOSE: 163 mg/dL
HEMOGLOBIN A1C: 7.3 % — ABNORMAL HIGH (ref 4.8–6.2)

## 2019-01-29 LAB — NT-PROBNP: NT-PROBNP: 138 pg/mL — ABNORMAL HIGH (ref 0–125)

## 2019-01-29 LAB — THYROID STIMULATING HORMONE (SENSITIVE TSH): TSH: 3.2 u[IU]/mL (ref 0.470–4.680)

## 2019-02-03 ENCOUNTER — Encounter (INDEPENDENT_AMBULATORY_CARE_PROVIDER_SITE_OTHER): Payer: Self-pay | Admitting: Family

## 2019-03-22 ENCOUNTER — Telehealth (INDEPENDENT_AMBULATORY_CARE_PROVIDER_SITE_OTHER): Payer: Self-pay | Admitting: Family Medicine

## 2019-03-22 NOTE — Telephone Encounter (Signed)
Patient complains of urinary pressure and frequency, chest congestion and low grade fever 99.0. patient is in [place] and wants to know if you can call in something to [facility] and [facility] will go to [facility] in [place] and [facility]

## 2019-03-23 ENCOUNTER — Telehealth (INDEPENDENT_AMBULATORY_CARE_PROVIDER_SITE_OTHER): Payer: Self-pay | Admitting: Family

## 2019-03-23 ENCOUNTER — Other Ambulatory Visit (INDEPENDENT_AMBULATORY_CARE_PROVIDER_SITE_OTHER): Payer: Self-pay | Admitting: Family

## 2019-03-23 MED ORDER — CIPROFLOXACIN 500 MG TABLET: 500 mg | Tab | Freq: Two times a day (BID) | ORAL | 0 refills | 0 days | Status: AC

## 2019-03-23 NOTE — Telephone Encounter (Signed)
Patient complains of urinary frequency and pressure and chest congestion and running a low grade fever of 99.0. She is in [place] and wants to know if you can call something into [facility] and she will have [facility] transferred to [facility] in [place].

## 2019-04-21 ENCOUNTER — Other Ambulatory Visit (INDEPENDENT_AMBULATORY_CARE_PROVIDER_SITE_OTHER): Payer: Self-pay | Admitting: Family Medicine

## 2019-06-04 ENCOUNTER — Other Ambulatory Visit (INDEPENDENT_AMBULATORY_CARE_PROVIDER_SITE_OTHER): Payer: Self-pay | Admitting: Family Medicine

## 2019-06-16 ENCOUNTER — Other Ambulatory Visit (INDEPENDENT_AMBULATORY_CARE_PROVIDER_SITE_OTHER): Payer: Self-pay | Admitting: Family Medicine

## 2019-07-07 ENCOUNTER — Other Ambulatory Visit: Payer: Self-pay

## 2019-07-07 ENCOUNTER — Encounter (INDEPENDENT_AMBULATORY_CARE_PROVIDER_SITE_OTHER): Payer: Self-pay | Admitting: Physician Assistant

## 2019-07-07 ENCOUNTER — Ambulatory Visit: Payer: Medicare PPO | Attending: Physician Assistant | Admitting: Physician Assistant

## 2019-07-07 VITALS — BP 128/90 | HR 65 | Temp 97.7°F | Resp 16 | Wt 208.2 lb

## 2019-07-07 DIAGNOSIS — H6592 Unspecified nonsuppurative otitis media, left ear: Secondary | ICD-10-CM | POA: Insufficient documentation

## 2019-07-07 DIAGNOSIS — H6502 Acute serous otitis media, left ear: Secondary | ICD-10-CM

## 2019-07-07 DIAGNOSIS — Z6834 Body mass index (BMI) 34.0-34.9, adult: Secondary | ICD-10-CM | POA: Insufficient documentation

## 2019-07-07 DIAGNOSIS — J029 Acute pharyngitis, unspecified: Secondary | ICD-10-CM | POA: Insufficient documentation

## 2019-07-07 MED ORDER — AZITHROMYCIN 250 MG TABLET
ORAL_TABLET | ORAL | 0 refills | Status: DC
Start: 2019-07-07 — End: 2019-08-03

## 2019-07-07 NOTE — Progress Notes (Signed)
FAMILY MEDICINE, [facility]  [place] [id number]  Operated by [facility]  Progress Note    Name: Joanna Black MRN:  [id number]   Date: 07/07/2019 Age: 73 y.o.       Reason for Visit: Sore Throat (x 1 week on left side, scheduled for procedure next Wednesday so want to make sure ok) and Ear Pain (when swallowing x 1 week)    Subjective:   C/o of left sided sore throat for 1 week.  Hurts into ear when she swallows.  1 week from today she is getting her esophagus stretched and they told her to come and get this check. Denies fever.  Hurts everytime she swallows. Denies other c/o.         Current Outpatient Medications   Medication Sig   . acetaminophen (TYLENOL) 325 mg Oral Tablet Take 325 mg by mouth Every night Takes 2 at night   . albuterol sulfate (PROVENTIL OR VENTOLIN OR PROAIR) 90 mcg/actuation Inhalation HFA Aerosol Inhaler Take 2 Puffs by inhalation Every 6 hours as needed   . azithromycin (ZITHROMAX) 250 mg Oral Tablet Take 500 mg (2 tab) on day 1; take 250 mg (1 tab) on days 2-5.   Marland Kitchen Blood Sugar Diagnostic (BLOOD GLUCOSE TEST) Strip Use to test blood glucose once daily.  Dx E11.9   . FLUoxetine (PROZAC) 20 mg Oral Capsule TAKE 1 CAPSULE BY MOUTH EVERY DAY   . metFORMIN (GLUCOPHAGE) 500 mg Oral Tablet TAKE 1 TABLET TWICE DAILY (Patient taking differently: One time )   . multivitamin Oral Tablet Take 1 Tab by mouth   . omeprazole (PRILOSEC) 40 mg Oral Capsule, Delayed Release(E.C.) Take 40 mg by mouth Once a day   . psyllium husk (METAMUCIL ORAL) Take by mouth   . spironolactone (ALDACTONE) 50 mg Oral Tablet TAKE 1 TABLET DAILY   . tiZANidine (ZANAFLEX) 4 mg Oral Tablet Take 1 Tab (4 mg total) by mouth Three times a day as needed (back pain) (Patient not taking: Reported on 07/07/2019)       Objective :  BP 128/90 (Site: Left, Patient Position: Sitting, Cuff Size: Adult)   Pulse 65   Temp 36.5 C (97.7 F) (Thermal Scan)   Resp 16   Wt 94.4 kg (208 lb 3.2 oz)    SpO2 95%   BMI 34.65 kg/m       Physical Exam   Constitutional: She is oriented to person, place, and time and well-developed, well-nourished, and in no distress.   HENT:   Head: Normocephalic and atraumatic.   Right Ear: Hearing, tympanic membrane, external ear and ear canal normal.   Left Ear: Hearing, external ear and ear canal normal. A middle ear effusion is present.   Mouth/Throat: Posterior oropharyngeal erythema present. No oropharyngeal exudate or posterior oropharyngeal edema.   PND noted.    Neurological: She is alert and oriented to person, place, and time.   Skin: Skin is warm and dry.   Psychiatric: Mood, memory, affect and judgment normal.   Vitals reviewed.      Assessment/Plan  Problem List Items Addressed This Visit     None      Visit Diagnoses     Pharyngitis, unspecified etiology    -  Primary    RX:  Z-pack x 1    Non-recurrent acute serous otitis media of left ear        OTC steroid nasal spray daily  Pharyngitis is most likely viral but in light of her upcoming procedure I felt it was best to air on the side of caution and go ahead and treat her with an antibiotic and patient was in agreement. May F/U prn.    Kathalene FramesJerri A Barth Trella, PA

## 2019-07-16 ENCOUNTER — Other Ambulatory Visit (INDEPENDENT_AMBULATORY_CARE_PROVIDER_SITE_OTHER): Payer: Self-pay

## 2019-08-03 ENCOUNTER — Other Ambulatory Visit: Payer: Self-pay

## 2019-08-03 ENCOUNTER — Encounter (INDEPENDENT_AMBULATORY_CARE_PROVIDER_SITE_OTHER): Payer: Self-pay | Admitting: Family

## 2019-08-03 ENCOUNTER — Ambulatory Visit: Payer: Medicare PPO | Attending: Family | Admitting: Family

## 2019-08-03 VITALS — BP 144/70 | HR 84 | Temp 97.5°F | Resp 18 | Ht 65.0 in | Wt 203.4 lb

## 2019-08-03 DIAGNOSIS — E559 Vitamin D deficiency, unspecified: Secondary | ICD-10-CM

## 2019-08-03 DIAGNOSIS — R0789 Other chest pain: Secondary | ICD-10-CM | POA: Insufficient documentation

## 2019-08-03 DIAGNOSIS — R0602 Shortness of breath: Secondary | ICD-10-CM | POA: Insufficient documentation

## 2019-08-03 DIAGNOSIS — R252 Cramp and spasm: Secondary | ICD-10-CM

## 2019-08-03 DIAGNOSIS — J984 Other disorders of lung: Secondary | ICD-10-CM | POA: Insufficient documentation

## 2019-08-03 DIAGNOSIS — R7989 Other specified abnormal findings of blood chemistry: Secondary | ICD-10-CM

## 2019-08-03 DIAGNOSIS — E781 Pure hyperglyceridemia: Secondary | ICD-10-CM

## 2019-08-03 DIAGNOSIS — E119 Type 2 diabetes mellitus without complications: Secondary | ICD-10-CM | POA: Insufficient documentation

## 2019-08-03 MED ORDER — ALBUTEROL SULFATE HFA 90 MCG/ACTUATION AEROSOL INHALER
1.0000 | INHALATION_SPRAY | Freq: Four times a day (QID) | RESPIRATORY_TRACT | 3 refills | Status: DC | PRN
Start: 2019-08-03 — End: 2021-12-19

## 2019-08-03 MED ORDER — FLUOXETINE 20 MG CAPSULE
ORAL_CAPSULE | ORAL | 2 refills | Status: DC
Start: 2019-08-03 — End: 2020-06-02

## 2019-08-03 MED ORDER — TIZANIDINE 4 MG TABLET
4.00 mg | ORAL_TABLET | Freq: Three times a day (TID) | ORAL | 1 refills | Status: DC | PRN
Start: 2019-08-03 — End: 2019-09-13

## 2019-08-03 NOTE — Progress Notes (Signed)
FAMILY MEDICINE, [facility]  [place]  [place] [address]  Phone: [phone]  Fax: [phone]    Encounter Date: 08/03/2019    Patient ID:  Joanna Black  YOV:[id number]    DOB: 1945-12-25  Age: 73 y.o. female    Subjective:     Chief Complaint   Patient presents with   . Follow Up 6 Months   . Medication Refill   .  Pain     once a month is having pain in neck, shoulders, chest tightness, indigestion   . Follow-up After Testing     throat stretching on 22/23 of July [facility]   . Referrals     cardiology   . Back Pain     radiates through hip to knee on left side     Patient is here for routine follow-up of chronic condition,  Pt has trouble mainly with lower back, intermittent; once had spinal injection at [facility] clinic. Usually resolves on own.     She recently had esophageal dilatation July 2020 at [facility], her upper GI done at the same time was normal other than a small amount gastritis    She complains of pain in neck shoulders (sometimes left or right) states chest tightness to nurse, states painful to breathing to me, but only during episodes which usually last about 1 week and occur once a month; most prominent symptom is pain in upper left back located along scapular border , she states it is constant, can't get comfortable, nothing makes it worse or better.  On several occasions when this has occurred she had cardiac workup because she states it went down her left arm.  These have always returned normal    She had cardiac workup in [place] at [facility] while she was visiting her son 07/2018, see scanned media report, Lexiscan nuclear stress was negative for ischemia and showed ejection fraction of 62% with no motion abnormalities, see scanned media . Her serial troponins were negative, all other blood work was negative, her chest x-ray was negative.  Heart catheterization was    She also complains of back pain which has been ongoing for many years radiates to hip and knee on  left side, lumbar x-ray last year demonstrated mild degenerative changes anterolisthesis at L4 through 5; she had had a lumbar MRI 2015 and saw Dr. 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  Lower back quit hurting by the time she made it there, so no further w/u    Diabetes - last A1C 01/2019 at 7.3%; had hard time taking statin other then pravachol currently on none, she is aware of diabetic guidelines that recommend statin use, she said she would think about it    CHF -by patient report she has acute exacerbations, BNP can go up to 600; EF when asymptomatic 65%; takes spironolactone BNP in February 2020 was 130, this was baseline    Hypertension - has home cuff, runs normal; controlled Cath 02/2016 mild disease    Hypertriglyceridemia - 200, no tx; hx statin intolerance    Colonoscopy - 04/02/2017 [facility], [place]  [place]- 06/23/2018  Pap/pelvic - TAH  DEXA - long ago nl  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 acetaminophen (TYLENOL) 325 mg Oral Tablet Take 325 mg by mouth Every night Takes 2 at night   . albuterol sulfate (PROVENTIL OR VENTOLIN OR PROAIR) 90 mcg/actuation Inhalation HFA Aerosol Inhaler Take 1-2 Puffs by inhalation Every 6 hours as needed   . Blood Sugar Diagnostic (BLOOD GLUCOSE TEST) Strip Use to test blood glucose once daily.  Dx E11.9   . FLUoxetine (PROZAC) 20 mg Oral Capsule TAKE 1 CAPSULE BY MOUTH EVERY DAY   . metFORMIN (GLUCOPHAGE) 500 mg Oral Tablet TAKE 1 TABLET TWICE DAILY (Patient taking differently: One time )   . multivitamin Oral Tablet Take 1 Tab by mouth   . omeprazole (PRILOSEC) 40 mg Oral Capsule, Delayed Release(E.C.) Take 40 mg by mouth Once a day   .  psyllium husk (METAMUCIL ORAL) Take by mouth   . spironolactone (ALDACTONE) 50 mg Oral Tablet TAKE 1 TABLET DAILY   . tiZANidine (ZANAFLEX) 4 mg Oral Tablet Take 1 Tab (4 mg total) by mouth Three times a day as needed for Other (muscle cramps)     Allergies   Allergen Reactions   . Amoxicillin    . Augmentin [Amoxicillin-Pot Clavulanate] Nausea/ Vomiting   . Statins-Hmg-Coa Reductase Inhibitors Myalgia     Past Medical History:   Diagnosis Date   . CAD (coronary artery disease)     mild   . CHF (congestive heart failure) (CMS HCC)    . Chronic bronchitis with emphysema (CMS HCC)    . Chronic low back pain    . Claustrophobia    . Diabetes (CMS HCC)    . Essential hypertension 01/28/2019   . Hypercholesterolemia    . Hypertension    . Hypertriglyceridemia      Past Surgical History:   Procedure Laterality Date   . COLONOSCOPY     . ESOPHAGOGASTRODUODENOSCOPY     . HX CARPAL TUNNEL RELEASE     . HX CHOLECYSTECTOMY     . HX EXPOSURE TO METAL SHAVINGS     . HX HEART CATHETERIZATION     . HX HYSTERECTOMY     . HX TAH AND BSO     . HX TUBAL LIGATION     . HX VEIN STRIPPING      Left leg         Family Medical History:     Problem Relation (Age of Onset)    Bone cancer Father    Diabetes Multiple family members    Hypertension (High Blood Pressure) Multiple family members    No Known Problems Mother    Stomach Cancer Paternal Grandmother        Social History     Tobacco Use   . Smoking status: Never Smoker   . Smokeless tobacco: Never Used   Substance Use Topics   . Alcohol use: No     Alcohol/week: 0.0 standard drinks   . Drug use: No       Objective:   Vitals: BP (!) 144/70 (Site: Left, Patient Position: Sitting, Cuff Size: Adult)   Pulse 84   Temp 36.4 C (97.5 F) (Thermal Scan)   Resp 18   Ht 1.651 m (5\' 5" )   Wt 92.3 kg (203 lb 6.4 oz)   SpO2 94%   Breastfeeding No   BMI 33.85 kg/m     Review of Systems  Other than  ROS in the HPI, all other systems were negative.      General: Alert and oriented to person  place and time.   Eyes: Pupils equal and round, reactive to light and accomodation.   HENT:  Normocephalic  Lungs: non-labored CTAB, diminished air flow bilateral lobes, mild intermittent accessory muscle use with intermittent air gulping  Cardiovascular: regular rate and occasional irregular beat, no murmur  MS: active ROM BUE and BLE  Skin: Skin warm and dry   Neurologic: gross and fine motor intact,  independent ambulation  Psychiatric: Normal affect, behavior is appropriate      Assessment & Plan:     ENCOUNTER DIAGNOSES     ICD-10-CM   1. Sensation of chest tightness R07.89   2. Shortness of breath R06.02   3. Elevated brain natriuretic peptide (BNP) level R79.89   4. 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 Diabetes (CMS HCC) E11.9   6. Hypertriglyceridemia E78.1   7. Muscle cramps R25.2   8. Vitamin D deficiency E55.9       Orders Placed This Encounter   . CT CHEST W IV CONTRAST   . COMPREHENSIVE METABOLIC PANEL, NON-FASTING   . CBC   . LIPID PANEL   . Urine Microalbumin Random   . Magnesium   . VITAMIN D 25 TOTAL   . CANCELED: [facility] to [facility]   . [facility]   . albuterol sulfate (PROVENTIL OR VENTOLIN OR PROAIR) 90 mcg/actuation Inhalation HFA Aerosol Inhaler   . FLUoxetine (PROZAC) 20 mg Oral Capsule   . tiZANidine (ZANAFLEX) 4 mg Oral Tablet     Would like to add statin for cv protection d/t DM 2; will likely add lasix and see if pt breathes; will request low dose ct of chest on same day as HF clinic    My suspect on this patient is that she has occupational lung disease, this is likely the antagonist for heart failure during times of heat exposure.  Feel that when her heart rate increases it fights against stiff/scarred lungs - such as when she was at the beach in hot weather and developed heart failure symptoms.  If she does have structural heart issues consistent with criteria for heart failure clinic definitely is preserved ejection fraction.      She needs CT of lung for  lungs cancer screening due to 17 years of industrial exposure.    Return in about 4 months (around 12/03/2019).    Colgate-PalmoliveBethany Merissa Renwick, CFNP

## 2019-08-03 NOTE — Nursing Note (Signed)
08/03/19 1318   Depression Screen   Little interest or pleasure in doing things. 0   Feeling down, depressed, or hopeless 0   PHQ 2 Total 0

## 2019-08-03 NOTE — Patient Instructions (Addendum)
COME WEDNESDAY - FRIDAY IF EPISODE HAPPENS AGAIN    INCREASE METFORMIN TO 2 IN AM AND 1 IN EVENING

## 2019-08-03 NOTE — Nursing Note (Signed)
08/03/19 1318   Fall Risk Assessment   Do you feel unsteady when standing or walking? Yes   Do you worry about falling? Yes   Have you fallen in the past year? No

## 2019-08-03 NOTE — Nursing Note (Signed)
08/03/19 1300   Please answer the following questions on a scale of 0 to 10   Overall Pain Rating [facility] 1   Activity- during the past 24 hrs, pain has interfered with your usual activity 0   Sleep- during the past 24 hrs, pain has interfered with your sleep 0   Mood- during the past 24 hours, pain has affected your mood 0   Stress- during the past 24 hours, pain has constributed to your stress 0

## 2019-08-04 ENCOUNTER — Ambulatory Visit: Payer: Medicare PPO | Attending: Family

## 2019-08-04 DIAGNOSIS — E119 Type 2 diabetes mellitus without complications: Secondary | ICD-10-CM | POA: Insufficient documentation

## 2019-08-04 DIAGNOSIS — R252 Cramp and spasm: Secondary | ICD-10-CM

## 2019-08-04 DIAGNOSIS — E559 Vitamin D deficiency, unspecified: Secondary | ICD-10-CM | POA: Insufficient documentation

## 2019-08-04 DIAGNOSIS — E781 Pure hyperglyceridemia: Secondary | ICD-10-CM | POA: Insufficient documentation

## 2019-08-04 LAB — COMPREHENSIVE METABOLIC PANEL, NON-FASTING
ALBUMIN: 4.4 g/dL (ref 3.9–5.0)
ALKALINE PHOSPHATASE: 113 U/L (ref 38–126)
ALT (SGPT): 13 U/L (ref 0–35)
ANION GAP: 8 mmol/L (ref 4–13)
AST (SGOT): 22 U/L (ref 14–36)
BILIRUBIN TOTAL: 0.5 mg/dL (ref 0.2–1.3)
BUN/CREA RATIO: 16 (ref 6–22)
BUN: 21 mg/dL — ABNORMAL HIGH (ref 7–17)
CALCIUM: 9.7 mg/dL (ref 8.4–10.2)
CHLORIDE: 100 mmol/L (ref 98–107)
CO2 TOTAL: 30 mmol/L (ref 22–30)
CREATININE: 1.35 mg/dL — ABNORMAL HIGH (ref 0.70–1.20)
GLUCOSE: 139 mg/dL — ABNORMAL HIGH (ref 65–100)
POTASSIUM: 5.3 mmol/L — ABNORMAL HIGH (ref 3.5–5.0)
PROTEIN TOTAL: 8.2 g/dL (ref 6.3–8.2)
SODIUM: 138 mmol/L (ref 137–145)

## 2019-08-04 LAB — CBC
HCT: 37.4 % (ref 36.0–47.0)
HGB: 12.3 g/dL (ref 12.0–15.0)
MCH: 30.6 pg (ref 27.0–32.0)
MCHC: 32.9 g/dL (ref 32.0–36.0)
MCV: 93.1 fL (ref 80.0–100.0)
MPV: 9 fL (ref 6.6–9.3)
PLATELETS: 312 10*3/uL (ref 140–450)
RBC: 4.02 10*6/uL — ABNORMAL LOW (ref 4.20–5.40)
RDW: 15 % (ref 12.0–15.0)
WBC: 7.6 10*3/uL (ref 4.8–10.8)

## 2019-08-04 LAB — LIPID PANEL
CHOL/HDL RATIO: 5.5
CHOLESTEROL: 209 mg/dL — ABNORMAL HIGH (ref <=200)
HDL CHOL: 38 mg/dL — ABNORMAL LOW (ref 40–60)
LDL CALC: 129 mg/dL — ABNORMAL HIGH (ref <=100)
TRIGLYCERIDES: 211 mg/dL — ABNORMAL HIGH (ref 0–150)
VLDL CALC: 42 mg/dL — ABNORMAL HIGH (ref <30)

## 2019-08-04 LAB — MICROALBUMIN URINE, RANDOM: MICROALBUMIN RANDOM URINE: <0.5 mg/dL (ref 0.0–2.0)

## 2019-08-04 LAB — VITAMIN D 25 TOTAL: VITAMIN D 25, TOTAL: 32.1 ng/mL (ref 30.00–100.00)

## 2019-08-04 LAB — MAGNESIUM: MAGNESIUM: 2.1 mg/dL (ref 1.6–2.3)

## 2019-08-11 ENCOUNTER — Telehealth (INDEPENDENT_AMBULATORY_CARE_PROVIDER_SITE_OTHER): Payer: Self-pay | Admitting: Family

## 2019-08-11 MED ORDER — METFORMIN ER 750 MG TABLET,EXTENDED RELEASE 24 HR
750.0000 mg | ORAL_TABLET | Freq: Every day | ORAL | 1 refills | Status: DC
Start: 2019-08-11 — End: 2020-01-17

## 2019-08-11 NOTE — Telephone Encounter (Signed)
Patient called to see if you can call in something to replace metformin. She says its been recalled.

## 2019-08-16 ENCOUNTER — Other Ambulatory Visit (INDEPENDENT_AMBULATORY_CARE_PROVIDER_SITE_OTHER): Payer: Self-pay | Admitting: Family

## 2019-08-16 DIAGNOSIS — E119 Type 2 diabetes mellitus without complications: Secondary | ICD-10-CM

## 2019-08-16 DIAGNOSIS — N183 Chronic kidney disease, stage 3 unspecified (CMS HCC): Secondary | ICD-10-CM

## 2019-08-16 DIAGNOSIS — Z8679 Personal history of other diseases of the circulatory system: Secondary | ICD-10-CM

## 2019-08-16 MED ORDER — VALSARTAN 40 MG TABLET
40.0000 mg | ORAL_TABLET | Freq: Every day | ORAL | 1 refills | Status: DC
Start: 2019-08-16 — End: 2019-09-01

## 2019-08-16 NOTE — Progress Notes (Signed)
I forgot to tell patient yesterday when I spoke to her husband about her blood work, she needs daily calcium and vitamin-D 1200 calcium and at least 2000 of vitamin-D a day due to vitamin-D a little on the low side; please let her know that her kidney function tests are slightly decreased but they have been for at least a year and have not changed.  Please let her know that I went through our her [facility] and her coronary arteries were clean.  However I did see the report on the heart failure and because of this past CHF and  present diabetes she really needs to be on low-dose Arb such as valsartan, it will protect the kidneys and it is preventative for heart failure.  It is standard to give a diabetic Ace or Arb for kidney protection.  I will send that into her mail order insurance.  Her cholesterol was somewhat elevated and they do recommend being on a statin with diabetes, but all discussed that with her at next office visit.  She needs to have recheck her blood  work in 1 month.  The orders in the system

## 2019-08-17 ENCOUNTER — Telehealth (INDEPENDENT_AMBULATORY_CARE_PROVIDER_SITE_OTHER): Payer: Self-pay | Admitting: Family

## 2019-08-17 NOTE — Telephone Encounter (Signed)
-----   Message from [place], [place] sent at 08/16/2019  6:44 PM EDT -----  I forgot to tell patient yesterday when I spoke to her husband about her blood work, she needs daily calcium and vitamin-D 1200 calcium and at least 2000 of vitamin-D a day due to vitamin-D a little on the low side; please let her know that her kidney function tests are slightly decreased but they have been for at least a year and have not changed.  Please let her know that I went through our her records from [facility] and her coronary arteries were clean.  However I did see the report on the heart failure and because of this past CHF and  present diabetes she really needs to be on low-dose Arb such as valsartan, it will protect the kidneys and it is preventative for heart failure.  It is standard to give a diabetic Ace or Arb for kidney protection.  I will send that into her mail order insurance.  Her cholesterol was somewhat elevated and they do recommend being on a statin with diabetes, but all discussed that with her at next office visit.  She needs to have recheck her blood  work in 1 month.  The orders in the system

## 2019-08-17 NOTE — Telephone Encounter (Signed)
Patient notified of lab results and provider notes.  Patient voiced understanding and no other concerns voiced at this time.    Kaelum Kissick, LPN    Delmus Warwick, [place]  8/97/8478, 41:28

## 2019-08-27 ENCOUNTER — Encounter (INDEPENDENT_AMBULATORY_CARE_PROVIDER_SITE_OTHER): Payer: Self-pay | Admitting: Family

## 2019-09-01 ENCOUNTER — Other Ambulatory Visit (INDEPENDENT_AMBULATORY_CARE_PROVIDER_SITE_OTHER): Payer: Self-pay | Admitting: Family

## 2019-09-01 DIAGNOSIS — N183 Chronic kidney disease, stage 3 unspecified (CMS HCC): Secondary | ICD-10-CM

## 2019-09-01 MED ORDER — IRBESARTAN 75 MG TABLET
75.0000 mg | ORAL_TABLET | Freq: Every day | ORAL | 1 refills | Status: DC
Start: 2019-09-01 — End: 2019-09-06

## 2019-09-06 ENCOUNTER — Ambulatory Visit (INDEPENDENT_AMBULATORY_CARE_PROVIDER_SITE_OTHER): Payer: Medicare PPO | Admitting: NURSE PRACTITIONER, FAMILY

## 2019-09-06 ENCOUNTER — Ambulatory Visit: Payer: Medicare PPO | Attending: Family

## 2019-09-06 ENCOUNTER — Encounter (INDEPENDENT_AMBULATORY_CARE_PROVIDER_SITE_OTHER): Payer: Self-pay | Admitting: NURSE PRACTITIONER, FAMILY

## 2019-09-06 ENCOUNTER — Other Ambulatory Visit: Payer: Self-pay

## 2019-09-06 VITALS — BP 140/94 | HR 73 | Temp 97.9°F | Resp 17 | Ht 65.0 in | Wt 204.0 lb

## 2019-09-06 DIAGNOSIS — I5042 Chronic combined systolic (congestive) and diastolic (congestive) heart failure: Secondary | ICD-10-CM

## 2019-09-06 DIAGNOSIS — N183 Chronic kidney disease, stage 3 unspecified (CMS HCC): Secondary | ICD-10-CM

## 2019-09-06 DIAGNOSIS — R7989 Other specified abnormal findings of blood chemistry: Secondary | ICD-10-CM

## 2019-09-06 DIAGNOSIS — I491 Atrial premature depolarization: Secondary | ICD-10-CM

## 2019-09-06 DIAGNOSIS — E875 Hyperkalemia: Secondary | ICD-10-CM

## 2019-09-06 DIAGNOSIS — R0789 Other chest pain: Secondary | ICD-10-CM

## 2019-09-06 DIAGNOSIS — I10 Essential (primary) hypertension: Secondary | ICD-10-CM

## 2019-09-06 DIAGNOSIS — E119 Type 2 diabetes mellitus without complications: Secondary | ICD-10-CM | POA: Insufficient documentation

## 2019-09-06 LAB — ECG W INTERP (CLINIC ONLY)
Atrial Rate: 70 {beats}/min
Calculated P Axis: 51 degrees
Calculated R Axis: -45 degrees
Calculated T Axis: 48 degrees
PR Interval: 176 ms
QRS Duration: 112 ms
QT Interval: 434 ms
QTC Calculation: 468 ms
Ventricular rate: 70 {beats}/min

## 2019-09-06 LAB — BASIC METABOLIC PANEL
ANION GAP: 10 mmol/L (ref 4–13)
BUN/CREA RATIO: 15 (ref 6–22)
BUN: 22 mg/dL — ABNORMAL HIGH (ref 7–17)
CALCIUM: 9.6 mg/dL (ref 8.4–10.2)
CHLORIDE: 99 mmol/L (ref 98–107)
CO2 TOTAL: 27 mmol/L (ref 22–30)
CREATININE: 1.42 mg/dL — ABNORMAL HIGH (ref 0.70–1.20)
GLUCOSE: 166 mg/dL — ABNORMAL HIGH (ref 65–100)
POTASSIUM: 4.6 mmol/L (ref 3.5–5.0)
SODIUM: 136 mmol/L — ABNORMAL LOW (ref 137–145)

## 2019-09-06 LAB — HGA1C (HEMOGLOBIN A1C WITH EST AVG GLUCOSE)
ESTIMATED AVERAGE GLUCOSE: 154 mg/dL
HEMOGLOBIN A1C: 7 % — ABNORMAL HIGH (ref 4.8–6.2)

## 2019-09-06 MED ORDER — LOSARTAN 50 MG-HYDROCHLOROTHIAZIDE 12.5 MG TABLET
1.0000 | ORAL_TABLET | Freq: Every day | ORAL | 0 refills | Status: DC
Start: 2019-09-06 — End: 2020-01-19

## 2019-09-06 NOTE — Patient Instructions (Signed)
1.  STOP: Spironolactone    2.  ADD: Losartan/HCTZ one every morning    3.  Will call you with lab results and ultrasound appointment    Follow up here in 1 month

## 2019-09-06 NOTE — Progress Notes (Signed)
Blood work stable, A1c 7.0

## 2019-09-06 NOTE — Progress Notes (Signed)
[facility]  [address]  [place] [id number]  Phone: [phone]  Fax: [phone]      Cardiology History and Physical  orthopnea  Name:  Joanna Black   DOB: 02/16/1946   MRN: [id number]   Date:  09/06/2019     PCP: George HughBethany Ratliff, CFNP     History of Present Illness  Joanna Locuslice R Berthelot is a 73 y.o. female here as new pt for eval elevated proBNP and history of CHF/chest tightness.  She states last summer while visiting family, summer months, developed swelling in feet and increasing dyspnea, had hospital admission where acute coronary event was ruled out and nuclear stress test was normal.  Recently she reports symptoms that begin with left shoulder pain-into left neck and chest-associated shortness of breath if tries to lie flat.  Not related to activity.  Not nescssilary related to activity.  She cannot determine if cause is chronic neck/shoulder pain or cardiac.  She says symptoms typically last intermittently for about 5 days, then resolve.  Occasional indigestion with sypmtoms.  +orthopnea if tries to lie flat during these episodes but no other time.  Denies PND, syncope.  Currently awaiting chest CT to eval worsening dyspnea.  No history cardiac intervention.    Past Medical History:   Diagnosis Date   . CAD (coronary artery disease)     mild   . CHF (congestive heart failure) (CMS HCC)    . Chronic bronchitis with emphysema (CMS HCC)    . Chronic low back pain    . Claustrophobia    . Diabetes (CMS HCC)    . Essential hypertension 01/28/2019   . Hypercholesterolemia    . Hypertension    . Hypertriglyceridemia          Patient Active Problem List    Diagnosis Date Noted   . Chest tightness 09/06/2019   . Essential hypertension 01/28/2019   . Cervical pain (neck) 07/28/2018   . Thoracic back pain 07/28/2018   . Chronic bilateral low back pain 07/28/2018   . Muscle cramps 07/28/2018   . Hypertriglyceridemia    . CAD (coronary artery disease)      mild     . Chronic low  back pain    . CHF (congestive heart failure) (CMS HCC)    . Diabetes (CMS HCC)    . 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 CHF exacerbation (CMS HCC) 02/20/2016   . Chronic GERD 02/20/2016   . Obesity 02/20/2016      Past Surgical History:   Procedure Laterality Date   . COLONOSCOPY     . ESOPHAGOGASTRODUODENOSCOPY     . HX CARPAL TUNNEL RELEASE     . HX CHOLECYSTECTOMY     . HX EXPOSURE TO METAL SHAVINGS     . HX HEART CATHETERIZATION     . HX HYSTERECTOMY     . HX TAH AND BSO     . HX TUBAL LIGATION     . HX VEIN STRIPPING      Left leg          Current Outpatient Medications   Medication Sig   . acetaminophen (TYLENOL) 325 mg Oral Tablet Take 325 mg by mouth Every night Takes 2 at night   . albuterol sulfate (PROVENTIL OR VENTOLIN OR PROAIR) 90 mcg/actuation Inhalation HFA Aerosol Inhaler Take 1-2 Puffs by inhalation Every  6 hours as needed   . Blood Sugar Diagnostic (BLOOD GLUCOSE TEST) Strip Use to test blood glucose once daily.  Dx E11.9   . FLUoxetine (PROZAC) 20 mg Oral Capsule TAKE 1 CAPSULE BY MOUTH EVERY DAY   . losartan-hydrochlorothiazide (HYZAAR) 50-12.5 mg Oral Tablet Take 1 Tab by mouth Once a day for 90 days   . metformin (GLUCOPHAGE-XR) 750 mg Oral Tablet Sustained Release 24 hr Take 1 Tab (750 mg total) by mouth Once a day   . multivitamin Oral Tablet Take 1 Tab by mouth   . omeprazole (PRILOSEC) 40 mg Oral Capsule, Delayed Release(E.C.) Take 40 mg by mouth Once a day   . psyllium husk (METAMUCIL ORAL) Take by mouth   . tiZANidine (ZANAFLEX) 4 mg Oral Tablet Take 1 Tab (4 mg total) by mouth Three times a day as needed for Other (muscle cramps) (Patient not taking: Reported on 09/06/2019)      Allergies   Allergen Reactions   . Amoxicillin    . Augmentin [Amoxicillin-Pot Clavulanate] Nausea/ Vomiting   . Statins-Hmg-Coa Reductase Inhibitors Myalgia     Family Medical History:     Problem Relation (Age of  Onset)    Bone cancer Father    Diabetes Multiple family members    Hypertension (High Blood Pressure) Multiple family members    No Known Problems Mother    Stomach Cancer Paternal Grandmother            Social History     Tobacco Use   . Smoking status: Never Smoker   . Smokeless tobacco: Never Used   Substance Use Topics   . Alcohol use: No     Alcohol/week: 0.0 standard drinks       REVIEW OF SYSTEMS:   General: No fever. No chills. No major weight changes.  HEENT: No headache or vision/hearing changes.  Cardiac: + chest tightness- pain-starts in left shoulder/neck area- she cannot tell if related arthritis/angina. No palpitations. No dizziness. No light-headedness. No syncope.  Resp: No dyspnea at rest. Worsening dyspnea on exertion. No cough. No hemoptysis. + orthopnea-unsure degree/intermitten, no PND.  GI: No N/V. No melena. No bright red blood per rectum.  + "indigestion"  Ext: No edema . No claudication.    Neuro: No focal weakness. No numbness.  All other ROS negative    Objective: BP (!) 140/94 (Site: Left, Patient Position: Sitting, Cuff Size: Adult)   Pulse 73   Temp 36.6 C (97.9 F) (Oral)   Resp 17   Ht 1.651 m (5\' 5" )   Wt 92.5 kg (204 lb)   SpO2 96%   BMI 33.95 kg/m            PHYSICAL EXAM:  Gen: NAD. Alert.   HEENT: PERRL; conjuntiva clear. No JVD or carotid bruit.  Cardiac: RRR with normal S1, S2.   Lungs: Clear but diminished to auscultation bilaterally. No rales. No wheezing. No rhonci.  Abdomen: Soft, non-tender.  Extremities: No edema. No cyanosis. No clubbing.  Neurologic:  Grossly intact  Skin: Warm and dry.     Diagnostic Tests/Labs:     Nuclear Stress 07-16-18  1.  No reversible ischemia or infarction  2.  Normal left ventricle wall motion  3.  Left ventricle EF 62%  4.  Non invasive risk stratification low    08-04-2019  SODIUM   137 - 145 mmol/L  138   136Low       POTASSIUM   3.5 - 5.0 mmol/L  5.3High     4.7     CHLORIDE   98 - 107 mmol/L  100   101     CO2 TOTAL   22 - 30  mmol/L  30   29     ANION GAP   4 - 13 mmol/L  8   6     BUN   7 - 17 mg/dL  54OEVO     35KKXF       CREATININE   0.70 - 1.20 mg/dL  1.35High     1.30High       BUN/CREA RATIO   6 - 22  16   17            Assesment and Plan   Problem List Items Addressed This Visit        Cardiovascular System    CHF (congestive heart failure) (CMS HCC)      Other Visit Diagnoses     Sensation of chest tightness    -  Primary    Relevant Medications    losartan-hydrochlorothiazide (HYZAAR) 50-12.5 mg Oral Tablet    Other Relevant Orders    EKG (In Clinic - [facility]) (Completed)    BASIC METABOLIC PANEL    TRANSTHORACIC ECHOCARDIOGRAM - ADULT    Elevated brain natriuretic peptide (BNP) level        Relevant Medications    losartan-hydrochlorothiazide (HYZAAR) 50-12.5 mg Oral Tablet    Other Relevant Orders    BASIC METABOLIC PANEL    TRANSTHORACIC ECHOCARDIOGRAM - ADULT    Hypertension, unspecified type        Hyperpotassemia              Chest tightness with Hx CHF- nuclear stress test negative 7/19, will update echocardiogram to define coronary anatomy.  Stop spironolactone due to elevated potassium.  May need cardiac cath.    Hypertension-  Currently not on ACE/ARB or BB.  Will ADD: Losartan/HCTZ and recheck in one month    Hyperpotassemia- STOP: spironolactone, recheck BMP today    Follow up here 1 month echo results, hypertension, may pursue additional diagnostics at that time    Return in about 4 weeks (around 10/04/2019).    Amy Loreta Ave, APRN,NP-C Note reviewed, agree with above. I was available to the mid level provider for consultation.  Donnamae Jude, MD  09/07/2019, 10:21

## 2019-09-07 NOTE — Progress Notes (Signed)
Pt is aware. Ewing Fandino, LPN  1/49/7026, 37:85

## 2019-09-13 ENCOUNTER — Other Ambulatory Visit (INDEPENDENT_AMBULATORY_CARE_PROVIDER_SITE_OTHER): Payer: Self-pay | Admitting: Family

## 2019-09-13 DIAGNOSIS — R252 Cramp and spasm: Secondary | ICD-10-CM

## 2019-09-14 ENCOUNTER — Encounter (HOSPITAL_COMMUNITY): Payer: Self-pay

## 2019-09-22 ENCOUNTER — Ambulatory Visit
Admission: RE | Admit: 2019-09-22 | Discharge: 2019-09-22 | Disposition: A | Discharge status: Home / Self Care | Payer: Medicare PPO | Source: Ambulatory Visit | Attending: NURSE PRACTITIONER, FAMILY | Admitting: NURSE PRACTITIONER, FAMILY

## 2019-09-22 ENCOUNTER — Other Ambulatory Visit: Payer: Self-pay

## 2019-09-22 DIAGNOSIS — R7989 Other specified abnormal findings of blood chemistry: Secondary | ICD-10-CM

## 2019-09-22 DIAGNOSIS — R0789 Other chest pain: Secondary | ICD-10-CM | POA: Insufficient documentation

## 2019-09-23 NOTE — Progress Notes (Signed)
Please call sched appt in next month at [facility] for results/followup

## 2019-09-24 ENCOUNTER — Telehealth (INDEPENDENT_AMBULATORY_CARE_PROVIDER_SITE_OTHER): Payer: Self-pay | Admitting: NURSE PRACTITIONER, FAMILY

## 2019-09-24 NOTE — Telephone Encounter (Addendum)
Pt will follow up at [facility] for results. (09/27/2019. Pt aware.       ----- Message from Isaias Sakai, APRN,NP-C sent at 09/23/2019  3:36 PM EDT -----  Please call sched appt in next month at [facility] for results/followup

## 2019-09-27 ENCOUNTER — Other Ambulatory Visit: Payer: Self-pay

## 2019-09-27 ENCOUNTER — Ambulatory Visit (INDEPENDENT_AMBULATORY_CARE_PROVIDER_SITE_OTHER): Payer: Medicare PPO | Admitting: NURSE PRACTITIONER, FAMILY

## 2019-09-27 ENCOUNTER — Encounter (INDEPENDENT_AMBULATORY_CARE_PROVIDER_SITE_OTHER): Payer: Self-pay | Admitting: NURSE PRACTITIONER, FAMILY

## 2019-09-27 VITALS — BP 142/74 | HR 88 | Temp 98.6°F | Resp 17 | Ht 65.0 in | Wt 206.0 lb

## 2019-09-27 DIAGNOSIS — I5022 Chronic systolic (congestive) heart failure: Secondary | ICD-10-CM

## 2019-09-27 DIAGNOSIS — E782 Mixed hyperlipidemia: Secondary | ICD-10-CM

## 2019-09-27 DIAGNOSIS — E781 Pure hyperglyceridemia: Secondary | ICD-10-CM

## 2019-09-27 DIAGNOSIS — I10 Essential (primary) hypertension: Secondary | ICD-10-CM

## 2019-09-27 MED ORDER — METOPROLOL SUCCINATE ER 25 MG TABLET,EXTENDED RELEASE 24 HR
25.00 mg | ORAL_TABLET | Freq: Every evening | ORAL | 0 refills | Status: DC
Start: 2019-09-27 — End: 2019-12-21

## 2019-09-27 NOTE — Progress Notes (Addendum)
[facility] [place]  [address]  [place] [address]  Phone: [phone]  Fax: [phone]      Cardiology History and Physical  orthopnea  Name:  Joanna Black   DOB: [birth year]   MRN: [id number]   Date:  09/27/2019     PCP: Rinaldo Ratel, CFNP     History of Present Illness  Joanna Black is a 73 y.o. female here as follow-up hypertension and echo results.  Recently she reports symptoms that begin with left shoulder pain-into left neck and chest-she believes the origin of the pain is the neck and the left shoulder.  She says symptoms typically last intermittently for about 5 days, then resolve.  Occasional indigestion with sypmtoms.  +orthopnea if tries to lie flat during these episodes but no other time but she relates this to reflux and inability to get comfortable with the neck and shoulder pain.  Denies PND, syncope.  We have discussed her echo today that shows normal systolic function.  She is tolerating her new blood pressure medications she has not had any worsening swelling with stopping the spirolactone.  She is agreeable to adding a beta-blocker.  She has a history of statin intolerance and at this time refuses statin.      Past Medical History:   Diagnosis Date   . CAD (coronary artery disease)     mild   . CHF (congestive heart failure) (CMS HCC)    . Chronic bronchitis with emphysema (CMS HCC)    . Chronic low back pain    . Claustrophobia    . Diabetes (CMS [place])    . Essential hypertension 01/28/2019   . Hypercholesterolemia    . Hypertension    . Hypertriglyceridemia          Patient Active Problem List    Diagnosis Date Noted   . Essential hypertension 01/28/2019   . Cervical pain (neck) 07/28/2018   . Thoracic back pain 07/28/2018   . Chronic bilateral low back pain 07/28/2018   . Muscle cramps 07/28/2018   . Hypertriglyceridemia    . Chronic low back pain    . Diabetes (CMS [place])    . 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 Chronic  systolic congestive heart failure (CMS HCC) 03/21/2016   . CHF exacerbation (CMS HCC) 02/20/2016   . Chronic GERD 02/20/2016   . Obesity 02/20/2016      Past Surgical History:   Procedure Laterality Date   . COLONOSCOPY     . ESOPHAGOGASTRODUODENOSCOPY     . HX CARPAL TUNNEL RELEASE     . HX CHOLECYSTECTOMY     . HX EXPOSURE TO METAL SHAVINGS     . HX HEART CATHETERIZATION     . HX HYSTERECTOMY     . HX TAH AND BSO     . HX TUBAL LIGATION     . HX VEIN STRIPPING      Left leg          Current Outpatient Medications   Medication Sig   . acetaminophen (TYLENOL) 325 mg Oral Tablet Take 325 mg by mouth Every night Takes 2 at night   . albuterol sulfate (PROVENTIL OR VENTOLIN OR PROAIR) 90 mcg/actuation Inhalation HFA Aerosol Inhaler Take 1-2 Puffs by inhalation Every 6 hours as needed   . Blood Sugar Diagnostic (BLOOD GLUCOSE TEST) Strip Use to test blood glucose once daily.  Dx E11.9   . FLUoxetine (PROZAC)  20 mg Oral Capsule TAKE 1 CAPSULE BY MOUTH EVERY DAY   . losartan-hydrochlorothiazide (HYZAAR) 50-12.5 mg Oral Tablet Take 1 Tab by mouth Once a day for 90 days   . metformin (GLUCOPHAGE-XR) 750 mg Oral Tablet Sustained Release 24 hr Take 1 Tab (750 mg total) by mouth Once a day   . metoprolol succinate (TOPROL-XL) 25 mg Oral Tablet Sustained Release 24 hr Take 1 Tab (25 mg total) by mouth Every night for 30 days   . multivitamin Oral Tablet Take 1 Tab by mouth   . omeprazole (PRILOSEC) 40 mg Oral Capsule, Delayed Release(E.C.) Take 40 mg by mouth Once a day   . psyllium husk (METAMUCIL ORAL) Take by mouth   . tiZANidine (ZANAFLEX) 4 mg Oral Tablet TAKE 1 TABLET THREE TIMES A DAY AS NEEDED FOR MUSCLE CRAMPS      Allergies   Allergen Reactions   . Amoxicillin    . Augmentin [Amoxicillin-Pot Clavulanate] Nausea/ Vomiting   . Statins-Hmg-Coa Reductase Inhibitors Myalgia     Family Medical History:     Problem Relation (Age of Onset)    Bone cancer Father    Diabetes Multiple family members    Hypertension (High Blood  Pressure) Multiple family members    No Known Problems Mother    Stomach Cancer Paternal Grandmother            Social History     Tobacco Use   . Smoking status: Never Smoker   . Smokeless tobacco: Never Used   Substance Use Topics   . Alcohol use: No     Alcohol/week: 0.0 standard drinks       REVIEW OF SYSTEMS:   General: No fever. No chills. No major weight changes.  HEENT: No headache or vision/hearing changes.  Cardiac: chest tightness- pain- she believes the pain is coming from her neck as radicular pain into her left arm and chest/ shoulder/neck area-  No palpitations. No dizziness. No light-headedness. No syncope.  Resp: No dyspnea at rest. Worsening dyspnea on exertion. No cough. No hemoptysis. + orthopnea-unsure degree/intermitten longstanding, no PND.  GI: No N/V. No melena. No bright red blood per rectum.  + "indigestion"  Ext: No edema . No claudication.    Neuro: No focal weakness. No numbness.  All other ROS negative    Objective: BP (!) 142/74 (Site: Left, Patient Position: Sitting, Cuff Size: Adult)   Pulse 88   Temp 37 C (98.6 F) (Oral)   Resp 17   Ht 1.651 m (5\' 5" )   Wt 93.4 kg (206 lb)   SpO2 94%   BMI 34.28 kg/m            PHYSICAL EXAM:  Gen: NAD. Alert.   HEENT: PERRL; conjuntiva clear. No JVD or carotid bruit.  Cardiac: RRR with normal S1, S2.   Lungs: Clear but diminished to auscultation bilaterally. No rales. No wheezing. No rhonci.  Abdomen: Soft, non-tender.  Extremities: No edema. No cyanosis. No clubbing.  Neurologic:  Grossly intact  Skin: Warm and dry.     Diagnostic Tests/Labs:     Echocardiogram 09/22/2019  1. Normal left ventricular size. Normal left ventricular ejection fraction. LV Ejection Fraction is 55 %.  Concentric remodeling. Left ventricular diastolic parameters were normal.  2. Resting Segmental Wall Motion Analysis: Total wall motion score is 1.00. There are no regional wall  motion abnormalities.  3. Normal right ventricle size and function.  4. Normal left  atrial size and morphology.  5.  There is no significant valvular heart disease.    Nuclear Stress 07-16-18  1.  No reversible ischemia or infarction  2.  Normal left ventricle wall motion  3.  Left ventricle EF 62%  4.  Non invasive risk stratification low    08-04-2019  SODIUM   137 - 145 mmol/L  138   136Low       POTASSIUM   3.5 - 5.0 mmol/L  5.3High     4.7     CHLORIDE   98 - 107 mmol/L  100   101     CO2 TOTAL   22 - 30 mmol/L  30   29     ANION GAP   4 - 13 mmol/L  8   6     BUN   7 - 17 mg/dL  [id number]     [id number]       CREATININE   0.70 - 1.20 mg/dL  1.35High     1.30High       BUN/CREA RATIO   6 - 22  16   17            Assesment and Plan   Problem List Items Addressed This Visit        Cardiovascular System    Hypertriglyceridemia    Essential hypertension - Primary    Chronic systolic congestive heart failure (CMS HCC)        1.  Stay on losartan/HCTZ for BP    2.  Stay OFF Spironolactone (fluid pill) made your potassium high    3.  ADD: Toprol XL one at bedtime for cardiac protection    4.  See PCP for neck and shoulder pain    5.  Recheck here in 4 months      Hypertension-  continue Losartan/HCTZ add:  Toprol XL 25 mg at bedtime    History of CHF with a normal EF-add Toprol XL 25 mg daily for protection.  Discussion of symptoms of CHF when to pursue care    Hyperlipidemia-LDLs elevated at 129, she has a history of statin intolerance, is not interested in a statin at this point, she understands the risks and benefits.  We have discussed diet exercise    Regarding the neck, shoulder, left arm pain-recommended follow-up with PCP.  If unable to determine the source of the pain, may need further cardiac workup    Will recheck in 4 months, proceed from there    Return in about 4 months (around 01/28/2020).    Amy Loreta AveMann, APRN,NP-C Note reviewed, agree with above. I was available to the mid level provider for consultation.  Donnamae Judeean E Icess Bertoni, MD  09/30/2019, 09:26

## 2019-09-27 NOTE — Patient Instructions (Signed)
1.  Stay on losartan/HCTZ for BP    2.  Stay OFF Spironolactone (fluid pill) made your potassium high    3.  ADD: Toprol XL one at bedtime for cardiac protection    4.  See PCP for neck and shoulder pain    5.  Recheck here in 4 months

## 2019-10-15 ENCOUNTER — Other Ambulatory Visit (INDEPENDENT_AMBULATORY_CARE_PROVIDER_SITE_OTHER): Payer: Self-pay | Admitting: Family

## 2019-10-25 ENCOUNTER — Ambulatory Visit (HOSPITAL_COMMUNITY): Payer: Medicare PPO | Admitting: Physician Assistant

## 2019-10-25 ENCOUNTER — Other Ambulatory Visit: Payer: Self-pay

## 2019-10-25 ENCOUNTER — Ambulatory Visit: Payer: Medicare PPO | Attending: Physician Assistant

## 2019-10-25 ENCOUNTER — Encounter (INDEPENDENT_AMBULATORY_CARE_PROVIDER_SITE_OTHER): Payer: Self-pay | Admitting: Physician Assistant

## 2019-10-25 ENCOUNTER — Ambulatory Visit: Payer: Medicare PPO | Attending: Physician Assistant | Admitting: Physician Assistant

## 2019-10-25 VITALS — BP 132/84 | HR 85 | Temp 96.9°F | Resp 16 | Wt 209.4 lb

## 2019-10-25 DIAGNOSIS — R350 Frequency of micturition: Secondary | ICD-10-CM

## 2019-10-25 DIAGNOSIS — N3001 Acute cystitis with hematuria: Secondary | ICD-10-CM | POA: Insufficient documentation

## 2019-10-25 DIAGNOSIS — R109 Unspecified abdominal pain: Secondary | ICD-10-CM | POA: Insufficient documentation

## 2019-10-25 DIAGNOSIS — Z6834 Body mass index (BMI) 34.0-34.9, adult: Secondary | ICD-10-CM | POA: Insufficient documentation

## 2019-10-25 MED ORDER — PHENAZOPYRIDINE 200 MG TABLET
200.00 mg | ORAL_TABLET | Freq: Three times a day (TID) | ORAL | 0 refills | Status: DC | PRN
Start: 2019-10-25 — End: 2020-03-27

## 2019-10-25 MED ORDER — CIPROFLOXACIN 500 MG TABLET
500.00 mg | ORAL_TABLET | Freq: Two times a day (BID) | ORAL | 0 refills | Status: DC
Start: 2019-10-25 — End: 2019-10-28

## 2019-10-25 NOTE — Progress Notes (Signed)
FAMILY MED, [facility] BUILD[facility]  [address] [place] [address]    Progress Note    Name: Joanna Black MRN:  [id number]   Date: 10/25/2019 Age: 73 y.o.           Reason for Visit: Urinary Frequency (and pressure x 3 days)    History of Present Illness  Joanna Black is a 73 y.o. female who is being seen today for above sx for 3 days.  Denies fever.  No OTC meds.  Drinking a lot of water over the past couple days and admits to drinking more coffee lately.  Has some chronic low back pain.      Current Outpatient Medications   Medication Sig   . acetaminophen (TYLENOL) 325 mg Oral Tablet Take 325 mg by mouth Every night Takes 2 at night   . albuterol sulfate (PROVENTIL OR VENTOLIN OR PROAIR) 90 mcg/actuation Inhalation HFA Aerosol Inhaler Take 1-2 Puffs by inhalation Every 6 hours as needed   . Blood Sugar Diagnostic (BLOOD GLUCOSE TEST) Strip Use to test blood glucose once daily.  Dx E11.9   . ciprofloxacin HCl (CIPRO) 500 mg Oral Tablet Take 1 Tab (500 mg total) by mouth Twice daily for 10 days   . FLUoxetine (PROZAC) 20 mg Oral Capsule TAKE 1 CAPSULE BY MOUTH EVERY DAY   . losartan-hydrochlorothiazide (HYZAAR) 50-12.5 mg Oral Tablet Take 1 Tab by mouth Once a day for 90 days   . metformin (GLUCOPHAGE-XR) 750 mg Oral Tablet Sustained Release 24 hr Take 1 Tab (750 mg total) by mouth Once a day   . metoprolol succinate (TOPROL-XL) 25 mg Oral Tablet Sustained Release 24 hr Take 1 Tab (25 mg total) by mouth Every night for 30 days   . multivitamin Oral Tablet Take 1 Tab by mouth   . omeprazole (PRILOSEC) 40 mg Oral Capsule, Delayed Release(E.C.) Take 40 mg by mouth Once a day   . phenazopyridine (PYRIDIUM) 200 mg Oral Tablet Take 1 Tab (200 mg total) by mouth Three times a day as needed (urinary pain/bladder spasms)   . psyllium husk (METAMUCIL ORAL) Take by mouth   . tiZANidine (ZANAFLEX) 4 mg Oral Tablet TAKE 1 TABLET THREE TIMES A DAY AS NEEDED FOR MUSCLE CRAMPS     Allergies   Allergen Reactions   .  Amoxicillin    . Augmentin [Amoxicillin-Pot Clavulanate] Nausea/ Vomiting   . Statins-Hmg-Coa Reductase Inhibitors Myalgia       Physical Exam  BP 132/84 (Site: Left, Patient Position: Sitting, Cuff Size: Adult)   Pulse 85   Temp 36.1 C (96.9 F) (Temporal)   Resp 16   Wt 95 kg (209 lb 6.4 oz)   SpO2 91%   BMI 34.85 kg/m       Physical Exam  Vitals signs reviewed.   Constitutional:       General: She is not in acute distress.     Appearance: Normal appearance.   HENT:      Head: Normocephalic and atraumatic.   Neck:      Musculoskeletal: Neck supple.   Abdominal:      Palpations: Abdomen is soft.      Tenderness: There is abdominal tenderness (mild suprapubic.). There is left CVA tenderness.   Skin:     General: Skin is warm and dry.   Neurological:      Mental Status: She is alert and oriented to person, place, and time.   Psychiatric:  Mood and Affect: Mood normal.         Behavior: Behavior normal.         Thought Content: Thought content normal.         Judgment: Judgment normal.          Noralyn Pick, LPN    LICENSED PRACTICAL NURSE       Nursing Note    Signed    Creation Time:  10/25/19 1228                      10/25/19 1200   Urine test  (Siemens Multistix 10 SG)   Time collected 1227   Color Light   Clarity Clear   Glucose Negative   Bilirubin Negative   Ketones Negative   Urine Specific Gravity 1.030   Blood (urine) (!) Trace Intact   pH 6.5   Protein Negative   Urobilinogen 0.2mg /dL (Normal)   Nitrite Negative   Leukocytes (!) 3+   Lot # [id number]   Expiration Date 09/21/20   Initials CD LPN                     Assessment and Plan  Problem List Items Addressed This Visit     None      Visit Diagnoses     Acute cystitis with hematuria    -  Primary    RX:  Cipro 500 bid x 10 days  RX:  Pyridium 200 tid prn    Urinary frequency        Relevant Orders    POCT Urine Dipstick    Acute left flank pain              Data Reviewed   Increase water and decrease caffeine.  Discussed precautions.   F/U prn.     Marin Roberts, NP

## 2019-10-25 NOTE — Nursing Note (Signed)
10/25/19 1200   Urine test  (Siemens Multistix 10 SG)   Time collected 1227   Color Light   Clarity Clear   Glucose Negative   Bilirubin Negative   Ketones Negative   Urine Specific Gravity 1.030   Blood (urine) (!) Trace Intact   pH 6.5   Protein Negative   Urobilinogen 0.2mg /dL (Normal)   Nitrite Negative   Leukocytes (!) 3+   Lot # [id number]   Expiration Date 09/21/20   Initials CD LPN

## 2019-10-28 ENCOUNTER — Other Ambulatory Visit (INDEPENDENT_AMBULATORY_CARE_PROVIDER_SITE_OTHER): Payer: Self-pay | Admitting: Physician Assistant

## 2019-10-28 ENCOUNTER — Telehealth (INDEPENDENT_AMBULATORY_CARE_PROVIDER_SITE_OTHER): Payer: Self-pay | Admitting: Family

## 2019-10-28 MED ORDER — NITROFURANTOIN MONOHYDRATE/MACROCRYSTALS 100 MG CAPSULE
100.00 mg | ORAL_CAPSULE | Freq: Two times a day (BID) | ORAL | 0 refills | Status: AC
Start: 2019-10-28 — End: 2019-11-07

## 2019-10-28 NOTE — Telephone Encounter (Signed)
Left message on voicemail for patient to contact office.  [place], LPN

## 2019-10-28 NOTE — Telephone Encounter (Signed)
Patient contacted off with concerns about medication.    Patient stated that she was in the office Monday and is being treated with ABX for UTI.    Patient stated medication is not working she is not getting any better.    Please contact with further instructions.    Kristi Hyer, LPN  20/05/155, 15:37

## 2019-11-02 ENCOUNTER — Encounter (INDEPENDENT_AMBULATORY_CARE_PROVIDER_SITE_OTHER): Payer: Self-pay | Admitting: Family

## 2019-11-09 ENCOUNTER — Telehealth (HOSPITAL_BASED_OUTPATIENT_CLINIC_OR_DEPARTMENT_OTHER): Payer: Self-pay | Admitting: Neurological Surgery

## 2019-11-09 NOTE — Telephone Encounter (Signed)
Called [facility]. She needs to scheduled an appointment with a Mid-level.   Alejandro Mulling, MA  11/09/2019, 15:30

## 2019-11-26 ENCOUNTER — Other Ambulatory Visit: Payer: Self-pay

## 2019-11-26 ENCOUNTER — Ambulatory Visit (INDEPENDENT_AMBULATORY_CARE_PROVIDER_SITE_OTHER): Payer: Medicare PPO | Admitting: Ophthalmology

## 2019-11-26 ENCOUNTER — Encounter (INDEPENDENT_AMBULATORY_CARE_PROVIDER_SITE_OTHER): Payer: Medicare PPO

## 2019-11-26 ENCOUNTER — Encounter (INDEPENDENT_AMBULATORY_CARE_PROVIDER_SITE_OTHER): Payer: Self-pay | Admitting: Ophthalmology

## 2019-11-26 DIAGNOSIS — H538 Other visual disturbances: Secondary | ICD-10-CM

## 2019-11-26 DIAGNOSIS — H25813 Combined forms of age-related cataract, bilateral: Secondary | ICD-10-CM

## 2019-11-26 MED ORDER — PREDNISOLONE ACETATE 1 % EYE DROPS,SUSPENSION
1.00 [drp] | Freq: Four times a day (QID) | OPHTHALMIC | 1 refills | Status: DC
Start: 2019-11-26 — End: 2020-01-19

## 2019-11-26 MED ORDER — OFLOXACIN 0.3 % EYE DROPS
1.00 [drp] | Freq: Four times a day (QID) | OPHTHALMIC | 0 refills | Status: DC
Start: 2019-11-26 — End: 2020-01-19

## 2019-11-26 NOTE — Progress Notes (Addendum)
[facility]  [address]  [place] [address]  [facility]         Patient Name: Joanna Black  MRN#: [id number]  Sharon: 03-16-1946    Date of Service: 11/26/2019    Chief Complaint     Cataract Evaluation          LAREEN MULLINGS is a 73 y.o. female who presents today for evaluation/consultation of:  HPI     Pt is here for Cat Eval, Referred by Dr. Malachi Bonds    Patient c/o blurry vision, OD>OS, gradual change.   Pt has a difficult time focusing and reading.   Pt denies glare and halos.   Patient denies flashes of light, new/abnormal floaters, and eye pain.   Patient denies changes in visual field.     Pt denies ocular history and surgery.   Pt denies Fhx.   Pt denies eye drop use.     Last edited by Denton Lank, COA on 11/26/2019  1:00 PM. (History)        ROS     Positive for: Eyes (BLurry)    Negative for: Constitutional, Gastrointestinal, Neurological, Skin, Genitourinary, Musculoskeletal, HENT, Endocrine, Cardiovascular, Respiratory, Psychiatric, Allergic/Imm, Heme/Lymph    Last edited by Denton Lank, COA on 11/26/2019  1:00 PM. (History)         All other systems Negative    Deanna Teter, COA 11/26/2019, 13:01   MD Addition to HPI:  Visual complaints, difficulty with reading, driving, performing daily activities         ICD-10-CM    1. Blurry vision, bilateral  H53.8 OPH OCT BI   2. Combined form of age-related cataract, both eyes  H25.813 OPH IOL MASTER     Assessment: Visual significant cataract right eye.  CPT: [place] with IOL  [id number]  The patient's impairment of visual function is believed not to be correctable with a tolerable change in glasses or contact lenses.     Cataract (in the operative eye) is believed to be significantly contributing to the patient's visual impairment.      Risks and benefits of cataract surgery reviewed with the patient including the risk of vision loss from infection or intraocular bleeding.  The increased  risk of retinal detachment following cataract surgery was discussed.  Patient understands the risks and benefits and is agreeable to proceed.     There is a reasonable expectation that lens surgery will significantly improve both the visual and functional status of the patient.  The patient desires surgical correction.      Plan: Risks, benefits, and nature of cataract extraction with lens implant reviewed and patient consents to surgery. Plan topical anesthesia with Monitored Anesthesia Care (MAC). Patient to see surgery coordinator today to schedule surgery.     Anticipated lens implant:SN60 WF  Special needs:  Dense cataract and OD 12/30/19 +24.50, OS 01/06/20 +24.00  Co-management: Yes, PRENDERGAST    I reviewed and made necessary changes to the technician, resident or fellow note regarding CC/HPI/ROS/Past Family, Medical and Social Hx/Surg Hx.  Gwendalyn Ege, MD 11/26/2019, 13:59    Past Surgical History:   Procedure Laterality Date   . COLONOSCOPY     . ESOPHAGOGASTRODUODENOSCOPY     . HX CARPAL TUNNEL RELEASE     . HX CHOLECYSTECTOMY     . HX EXPOSURE TO METAL SHAVINGS     . HX HEART CATHETERIZATION     . HX HYSTERECTOMY     .  HX TAH AND BSO     . HX TUBAL LIGATION     . HX VEIN STRIPPING      Left leg         Past Medical History:   Diagnosis Date   . CAD (coronary artery disease)     mild   . CHF (congestive heart failure) (CMS HCC)    . Chronic bronchitis with emphysema (CMS HCC)    . Chronic low back pain    . Claustrophobia    . Diabetes (CMS [place])    . Essential hypertension 01/28/2019   . Hypercholesterolemia    . Hypertension    . Hypertriglyceridemia          Patient Active Problem List   Diagnosis   . Hypertriglyceridemia   . Chronic low back pain   . Diabetes (CMS [place])   . Cervical pain (neck)   . Thoracic back pain   . Chronic bilateral low back pain   . Muscle cramps   . Essential hypertension   . Cardiomyopathy, dilated, nonischemic (CMS HCC)   . CHF exacerbation (CMS HCC)   . Chronic GERD   . Chronic  systolic congestive heart failure (CMS HCC)   . Obesity       Family History:  Family Medical History:     Problem Relation (Age of Onset)    Bone cancer Father    Diabetes Multiple family members    Hypertension (High Blood Pressure) Multiple family members    No Known Problems Mother    Stomach Cancer Paternal Grandmother              Social History:     Social History     Socioeconomic History   . Marital status: Married     Spouse name: [name]   . Number of children: 3   . Years of education: 17   . Highest education level: High school graduate   Occupational History   . Occupation: Retired   Scientific laboratory technician   . Financial resource strain: Not hard at all   . Food insecurity     Worry: Never true     Inability: Never true   . Transportation needs     Medical: No     Non-medical: No   Tobacco Use   . Smoking status: Never Smoker   . Smokeless tobacco: Never Used   Substance and Sexual Activity   . Alcohol use: No     Alcohol/week: 0.0 standard drinks   . Drug use: No   . Sexual activity: Not Currently     Partners: Male   Lifestyle   . Physical activity     Days per week: 0 days     Minutes per session: 0 min   . Stress: Only a little   Relationships   . Social connections     Talks on phone: More than three times a week     Gets together: More than three times a week     Attends religious service: Never     Active member of club or organization: No     Attends meetings of clubs or organizations: Never     Relationship status: Married   . Intimate partner violence     Fear of current or ex partner: No     Emotionally abused: No     Physically abused: No     Forced sexual activity: No   Other Topics Concern   . Right  hand dominant Yes        Social History     Tobacco Use   . Smoking status: Never Smoker   . Smokeless tobacco: Never Used   Substance Use Topics   . Alcohol use: No     Alcohol/week: 0.0 standard drinks       Gwendalyn Ege, MD 11/26/2019, 13:59

## 2019-12-08 ENCOUNTER — Encounter (INDEPENDENT_AMBULATORY_CARE_PROVIDER_SITE_OTHER): Payer: Self-pay | Admitting: Family

## 2019-12-21 ENCOUNTER — Other Ambulatory Visit (INDEPENDENT_AMBULATORY_CARE_PROVIDER_SITE_OTHER): Payer: Self-pay | Admitting: NURSE PRACTITIONER, FAMILY

## 2019-12-23 ENCOUNTER — Encounter (HOSPITAL_COMMUNITY): Payer: Self-pay | Admitting: Ophthalmology

## 2019-12-23 ENCOUNTER — Other Ambulatory Visit: Payer: Self-pay

## 2019-12-30 ENCOUNTER — Encounter (HOSPITAL_COMMUNITY): Payer: Self-pay | Admitting: Ophthalmology

## 2019-12-30 ENCOUNTER — Ambulatory Visit (HOSPITAL_COMMUNITY): Payer: Medicare PPO | Admitting: Anesthesiology

## 2019-12-30 ENCOUNTER — Inpatient Hospital Stay
Admission: RE | Admit: 2019-12-30 | Discharge: 2019-12-30 | Disposition: A | Discharge status: Home / Self Care | Payer: Medicare PPO | Source: Ambulatory Visit | Attending: Ophthalmology | Admitting: Ophthalmology

## 2019-12-30 ENCOUNTER — Encounter (HOSPITAL_COMMUNITY): Payer: Medicare PPO | Admitting: Ophthalmology

## 2019-12-30 ENCOUNTER — Encounter (HOSPITAL_COMMUNITY)
Admission: RE | Disposition: A | Discharge status: Home / Self Care | Payer: Self-pay | Source: Ambulatory Visit | Attending: Ophthalmology

## 2019-12-30 DIAGNOSIS — Z9889 Other specified postprocedural states: Secondary | ICD-10-CM

## 2019-12-30 DIAGNOSIS — H25811 Combined forms of age-related cataract, right eye: Secondary | ICD-10-CM | POA: Insufficient documentation

## 2019-12-30 DIAGNOSIS — E1136 Type 2 diabetes mellitus with diabetic cataract: Secondary | ICD-10-CM | POA: Insufficient documentation

## 2019-12-30 HISTORY — DX: Depression, unspecified: F32.A

## 2019-12-30 HISTORY — DX: Type 2 diabetes mellitus without complications: E11.9

## 2019-12-30 HISTORY — DX: Gastro-esophageal reflux disease without esophagitis: K21.9

## 2019-12-30 HISTORY — DX: Dorsopathy, unspecified: M53.9

## 2019-12-30 HISTORY — PX: HX CATARACT REMOVAL: SHX102

## 2019-12-30 HISTORY — DX: Unspecified osteoarthritis, unspecified site: M19.90

## 2019-12-30 SURGERY — PHACO WITH INTRAOCULAR LENS
Anesthesia: Monitor Anesthesia Care | Laterality: Right | Wound class: Clean Wound: Uninfected operative wounds in which no inflammation occurred

## 2019-12-30 MED ORDER — OFLOXACIN 0.3 % EYE DROPS
1.0000 [drp] | Freq: Four times a day (QID) | OPHTHALMIC | 0 refills | Status: DC
Start: 2019-12-30 — End: 2020-01-19

## 2019-12-30 MED ORDER — SODIUM CHLORIDE 0.9 % (FLUSH) INJECTION SYRINGE
10.00 mL | INJECTION | Freq: Three times a day (TID) | INTRAMUSCULAR | Status: DC
Start: 2019-12-30 — End: 2019-12-30

## 2019-12-30 MED ORDER — SODIUM CHLORIDE 0.9 % INTRAVENOUS SOLUTION
Freq: Once | INTRAVENOUS | Status: AC
Start: 2019-12-30 — End: 2019-12-30

## 2019-12-30 MED ORDER — CYCLOPENT 1 %-TROPICAMIDE 1 %-PROPARACAINE 0.1 %-PE-KETOROLAC EYE DROP
1.0000 [drp] | OPHTHALMIC | Status: AC
Start: 2019-12-30 — End: 2019-12-30
  Administered 2019-12-30 (×4): 1 [drp] via OPHTHALMIC
  Filled 2019-12-30: qty 1

## 2019-12-30 MED ORDER — TETRACAINE 0.5 % EYE DROPS
Freq: Once | OPHTHALMIC | Status: DC | PRN
Start: 2019-12-30 — End: 2019-12-30
  Administered 2019-12-30: 2 [drp] via OPHTHALMIC

## 2019-12-30 MED ORDER — BRIMONIDINE 0.2 % EYE DROPS
1.0000 [drp] | Freq: Once | OPHTHALMIC | Status: DC | PRN
Start: 2019-12-30 — End: 2019-12-30
  Administered 2019-12-30: 12:00:00 1 [drp] via OPHTHALMIC
  Filled 2019-12-30: qty 5

## 2019-12-30 MED ORDER — EPINEPHRINE 1 MG/ML (1 ML) INJECTION SOLUTION
Freq: Once | INTRAOCULAR | Status: DC | PRN
Start: 2019-12-30 — End: 2019-12-30
  Administered 2019-12-30: 390 mL via INTRAOCULAR

## 2019-12-30 MED ORDER — PREDNISOLONE ACETATE 1 % EYE DROPS,SUSPENSION
1.0000 [drp] | Freq: Four times a day (QID) | OPHTHALMIC | Status: DC
Start: 2019-12-30 — End: 2020-01-19

## 2019-12-30 MED ORDER — MIDAZOLAM (PF) 1 MG/ML INJECTION SOLUTION
Freq: Once | INTRAMUSCULAR | Status: DC | PRN
Start: 2019-12-30 — End: 2019-12-30
  Administered 2019-12-30: 2 mg via INTRAVENOUS

## 2019-12-30 MED ORDER — LIDOCAINE (PF) 10 MG/ML (1 %) INJECTION SOLUTION
2.0000 mL | Freq: Once | INTRAMUSCULAR | Status: DC | PRN
Start: 2019-12-30 — End: 2019-12-30
  Administered 2019-12-30: 12:00:00 1.5 mL via INTRAOCULAR

## 2019-12-30 MED ORDER — SODIUM CHLORIDE 0.9 % (FLUSH) INJECTION SYRINGE
10.00 mL | INJECTION | INTRAMUSCULAR | Status: DC | PRN
Start: 2019-12-30 — End: 2019-12-30

## 2019-12-30 MED ORDER — SODIUM CHLORIDE 0.9 % INTRAVENOUS SOLUTION
INTRAVENOUS | Status: DC | PRN
Start: 2019-12-30 — End: 2019-12-30

## 2019-12-30 MED ORDER — MIDAZOLAM (PF) 1 MG/ML INJECTION SOLUTION
INTRAMUSCULAR | Status: AC
Start: 2019-12-30 — End: 2019-12-30
  Filled 2019-12-30: qty 2

## 2019-12-30 MED ORDER — MOXIFLOXACIN 0.5% INJECTION FOR INTRACAMERAL USE
0.1000 mL | INJECTION | Freq: Once | Status: DC | PRN
Start: 2019-12-30 — End: 2019-12-30
  Administered 2019-12-30: 12:00:00 .1 mL via INTRACAMERAL
  Filled 2019-12-30: qty 0.5

## 2019-12-30 MED ORDER — BALANCED SALT SOLUTION COMBINATION NO.2 INTRAOCULAR IRRIGATION
Freq: Once | INTRAOCULAR | Status: DC | PRN
Start: 2019-12-30 — End: 2019-12-30
  Administered 2019-12-30: 15 mL via OPHTHALMIC

## 2019-12-30 MED ORDER — MOXIFLOXACIN 0.5 % EYE DROPS
1.0000 [drp] | OPHTHALMIC | Status: AC
Start: 2019-12-30 — End: 2019-12-30
  Administered 2019-12-30 (×4): 1 [drp] via OPHTHALMIC

## 2019-12-30 MED ORDER — ACETAMINOPHEN 325 MG TABLET
650.00 mg | ORAL_TABLET | ORAL | Status: DC | PRN
Start: 2019-12-30 — End: 2019-12-30

## 2019-12-30 MED ORDER — TIMOLOL MALEATE 0.5 % ONCE DAILY EYE DROPS
1.0000 [drp] | Freq: Once | OPHTHALMIC | Status: DC | PRN
Start: 2019-12-30 — End: 2019-12-30
  Administered 2019-12-30: 12:00:00 1 [drp] via OPHTHALMIC
  Filled 2019-12-30: qty 2.5

## 2019-12-30 SURGICAL SUPPLY — 37 items
CANNULA IRRG 4MM 30GA (VASCULAR) IMPLANT
CANNULA OPTH 8MM 25GA HDSCT STRL DISP (OPHTHALMIC SUPPLIES (NOT LENS)) IMPLANT
CART LEN MNRCH III STRL DISP (OPHTHALMIC SUPPLIES (NOT LENS)) IMPLANT
CARTRIDGE INJECTOR D MONARCH_8065977763 SNGL USE 10EA/BX (OPTHALMIC SUPPLIES (NOT LENS))
CONV USE 48189 - SYRINGE MONOJECT 1ML LF  STRL REG TIP GRAD TB SIL POLYPROP EPOXY STD DISP (Syringes w/ o Needles) ×1 IMPLANT
CONV USE ITEM 321846 - GLOVE SURG 7.5 LF PF BEAD CUF_STRL 12IN PROTEXIS PI PLISPRN (GLOVES AND ACCESSORIES) ×1 IMPLANT
CONV USE ITEM 321863 - GLOVE SURG 6.5 LF PF SMTH BE_AD CUF STRL NTR 12IN PROTEXIS (GLOVES AND ACCESSORIES) ×1 IMPLANT
CYSTO IRR 25GA ANG FRM RVRS CUT STRL DISP (SURGICAL INSTRUMENTS) IMPLANT
DEVICE VISCOELAS DUOVISC 27GA_SOL CANN 4% CHONDROITIN SLF (PHAR) ×1
DEVICE VISCOELAS DUOVISC KIT SOL (PHAR) ×1 IMPLANT
DISC USE ITEM 162365 - NEEDLE FLTR 1.5IN 19GA REG BVL_LL HUB DEHP-FR BD STRL LF (NEEDLES & SYRINGE SUPPLIES) IMPLANT
DUPE USE ITEM 405249 - LENS IOL 0 D +24.5 DIOP MOD L PLANAR ACRYSOF STABLEFORCE ULTRASERT TENSIONGLIDE ASPH UV BLU LIGHT ×1 IMPLANT
GLOVE EXM MED LF STRL SNSCR_VNYL PF SMTH BEAD CUF 9.5IN (GLOVES AND ACCESSORIES) ×1 IMPLANT
GLOVE SURG 6.5 LF PF SMTH BE_AD CUF STRL NTR 12IN PROTEXIS (GLOVES AND ACCESSORIES) ×1
GOWN SURG XL AAMI L3 REINF STR L BACK SET IN SLEEVE STRL LF (GOWN) ×1
GOWN SURG XL L3 REINF SET IN SLEEVE STRL LF  DISP BLU ASTND FBRC (GOWN) ×1 IMPLANT
GOWN SURG XL XLNG AAMI L4 IMPR_V RGLN SLEEVE BRTHBL STRL LF (PROTECTIVE PRODUCTS/GARMENTS)
GOWN SURG XL XLNG L4 RGLN SLEEVE BRTHBL STRL LF  DISP SMARTGOWN (PROTECTIVE PRODUCTS/GARMENTS) IMPLANT
HYDROSECTION ANG 25GA 8065441420 (OPTHALMIC SUPPLIES (NOT LENS))
KNIFE OPTH 2.4MM INTREPID ANG BVL STAB MICRO COAX SLT STRL (OPTHALMIC SUPPLIES (NOT LENS))
KNIFE OPTH 2.4MM INTREPID ANG BVL STAB MICRO COAX SLT STRL DISP (OPHTHALMIC SUPPLIES (NOT LENS)) IMPLANT
MBO USE ITEM 306904 - GOWN SURG XL L3 REINF SET IN SLEEVE STRL LF  DISP BLU ASTND FBRC (GOWN) ×1
NEEDLE HYPO  20GA 1IN JELCO NEEDLEPRO EDGE BVL YW STRL LF  DISP (NEEDLES & SYRINGE SUPPLIES) ×1 IMPLANT
PACK CUSTOM CATARACT ALCON (TRAY) ×1 IMPLANT
PEN SURG MRKNG WRITESITE + RLR LBL SET 3X DARKER FORMULATE GNTN VIOL INK STRL LF  CHLRPRP (MISCELLANEOUS PT CARE ITEMS) IMPLANT
SHIELD EYE 6.75X17IN 5.75IN ASST TWR TIDISHIELD RSPS DISPENSR LEN FRM DISP LF (Other Miscellaneous) ×1 IMPLANT
SOL IRRG 0.9% NACL 1000ML PLASTIC PR BTL ISTNC N-PYRG STRL LF (SOLUTIONS) ×1 IMPLANT
SOL IRRG BSS MLT ELCLY GLS CONTAINR 500ML (SOLUTIONS) ×1 IMPLANT
SOL IRRG BSS PLAIN 15ML STRL L (SUPP) ×1 IMPLANT
SOL IV 0.9% NACL 10ML DEHP-FR PRSV FR 1 DS PLASTIC FLIPTOP VIAL USP LF  LIGHT GRN (IV TUBING & ACCESSORIES) ×1 IMPLANT
SOL IV 0.9% NACL 10ML PRSV FR 1 DS DEHP-FR PLASTIC FLPTP (IV Tubing & Accessories) ×1
SOL PREP BDINE 10% PVP 4OZ BTL PREOP SKIN PREP (DIS) ×1 IMPLANT
SOL PRP BDINE 10% PVP 4OZ BTL PREOP SKN PREP (DIS) ×1
SOL SURG SCRUB BDINE TOP PVP IOD ANTIMIC PLASTIC BTL FOAM 4OZ (DIS) IMPLANT
SOL SURG SCRUB BDINE TOP PVP I_OD ANTIMIC PLASTIC BTL FOAM 4 (DIS)
TIP SUCT/IRRG .03MM INTREPID 35D BNT PLMR DISP (OPHTHALMIC SUPPLIES (NOT LENS)) IMPLANT
TIP SUCT/IRRG 35DEG POLYMER_8065751511 6EA/BX (OPTHALMIC SUPPLIES (NOT LENS))

## 2019-12-30 NOTE — H&P (Signed)
[name]   [facility]  History and Physical      Date:  12/30/2019   Joanna Black, Joanna Black, 74 y.o. female  Date of Birth:  November 10, 1946    Complaint:   No chief complaint on file.    HPI: Joanna Black is a 74 y.o., female who presents with  No diagnosis found..         Past Medical History:   Diagnosis Date   . Arthritis    . Back problem    . CAD (coronary artery disease)     mild   . CHF (congestive heart failure) (CMS HCC)    . Chronic bronchitis with emphysema (CMS HCC)    . Chronic low back pain    . Claustrophobia    . Depression    . Diabetes (CMS HCC)    . Esophageal reflux    . Essential hypertension 01/28/2019   . Hypercholesterolemia    . Hypertension    . Hypertriglyceridemia    . Type 2 diabetes mellitus (CMS HCC)          Allergies   Allergen Reactions   . Amoxicillin    . Augmentin [Amoxicillin-Pot Clavulanate] Nausea/ Vomiting   . Statins-Hmg-Coa Reductase Inhibitors Myalgia     Current Facility-Administered Medications   Medication Dose Route Frequency Provider Last Rate Last Admin   . acetaminophen (TYLENOL) tablet  650 mg Oral Q4H PRN Kathleen Argue, MD       . brimonidine [facility]) 0.2% ophthalmic solution  1 Drop Right Eye INTRA-OP Once PRN Kathleen Argue, MD       . lidocaine PF (XYLOCAINE-MPF) 1% injection  2 mL Intraocular INTRA-OP Once PRN Kathleen Argue, MD       . moxifloxacin (VIGAMOX) 0.5 % intracameral injection  0.1 mL Intracameral INTRA-OP Once PRN Kathleen Argue, MD       . NS flush syringe  10 mL Intracatheter Collene Leyden, MD       . NS flush syringe  10 mL Intracatheter Q1H PRN Kathleen Argue, MD       . timolol (ISTALOL) 0.5% ophthalmic solution  1 Drop Right Eye INTRA-OP Once PRN Kathleen Argue, MD          Social History     Tobacco Use   . Smoking status: Never Smoker   . Smokeless tobacco: Never Used   Substance Use Topics   . Alcohol use: No     Alcohol/week: 0.0 standard drinks     Family Medical History:     Problem Relation (Age of Onset)    Bone cancer Father    Diabetes Multiple family members    Hypertension (High Blood Pressure) Multiple family members    No Known Problems Mother    Stomach Cancer Paternal Grandmother              FQH:KUVJD than ROS in the HPI, all other systems were negative.    EXAM:    General: appears in good health  Eyes: Performed at [facility].    HENT:ENMT without erythema or injection, mucous membranes moist.  Neck: no thyromegaly or lymphadenopathy  Lungs: Clear to auscultation bilaterally.   Cardiovascular: regular rate and rhythm: Soft, non-tender  Extremities: No cyanosis or edema    ASSESSMENT/PLAN:    CATARACT OD, PLAN CE /IOL OD    Kathleen Argue, MD 12/30/2019, 11:48

## 2019-12-30 NOTE — Discharge Summary (Signed)
Discharge Note  Patient meets discharge criteria  Discharge to home  Return to [facility] as scheduled or sooner if need be  Keep surgical dressing in place    Kathleen Argue, MD 12/30/2019, 12:32

## 2019-12-30 NOTE — Anesthesia Postprocedure Evaluation (Signed)
Anesthesia Post Op Evaluation    Patient: Joanna Black  Procedure(s):  PHACO WITH INTRAOCULAR LENS    Last Vitals:Temperature: 36.9 C (98.5 F) (12/30/19 1140)  Heart Rate: 62 (12/30/19 1140)  BP (Non-Invasive): (!) 171/70 (12/30/19 1140)  Respiratory Rate: 18 (12/30/19 1140)  SpO2: 97 % (12/30/19 1140)    Patient is sufficiently recovered from the effects of anesthesia to participate in the evaluation and has returned to their pre-procedure level.  Patient location during evaluation: PACU       Patient participation: complete - patient participated  Level of consciousness: awake and alert and responsive to verbal stimuli    Pain score: 0  Pain management: adequate  Airway patency: patent    Anesthetic complications: no  Cardiovascular status: acceptable  Respiratory status: acceptable  Hydration status: acceptable  Patient post-procedure temperature: Pt Normothermic   PONV Status: Absent

## 2019-12-30 NOTE — Discharge Instructions (Signed)
SURGICAL DISCHARGE INSTRUCTIONS     Dr. Moore, Charles, MD  performed your PHACO WITH INTRAOCULAR LENS today at the [facility]    [facility]  Fridays 7:30 am - 4:30 pm  [phone]    PLEASE SEE WRITTEN HANDOUTS AS DISCUSSED BY YOUR NURSE:  Ulyess Muto, RN      SIGNS AND SYMPTOMS OF A WOUND / INCISION INFECTION   Be sure to watch for the following:   Increase in redness or red streaks near or around the wound or incision.   Increase in pain that is intense or severe and cannot be relieved by the pain medication that your doctor has given you.   Increase in swelling that cannot be relieved by elevation of a body part, or by applying ice, if permitted.   Increase in drainage, or if yellow / green in color and smells bad. This could be on a dressing or a cast.   Increase in fever for longer than 24 hours, or an increase that is higher than 101 degrees Fahrenheit (normal body temperature is 98 degrees Fahrenheit). The incision may feel warm to the touch.    **CALL YOUR DOCTOR IF ONE OR MORE OF THESE SIGNS / SYMPTOMS SHOULD OCCUR.    ANESTHESIA INFORMATION   ANESTHESIA -- ADULT PATIENTS:  You have received intravenous sedation / general anesthesia, and you may feel drowsy and light-headed for several hours. You may even experience some forgetfulness of the procedure. DO NOT DRIVE A MOTOR VEHICLE or perform any activity requiring complete alertness or coordination until you feel fully awake in about 24-48 hours. Do not drink alcoholic beverages for at least 24 hours. Do not stay alone, you must have a responsible adult available to be with you. You may also experience a dry mouth or nausea for 24 hours. This is a normal side effect and will disappear as the effects of the medication wear off.    REMEMBER   If you experience any difficulty breathing, chest pain, bleeding that you feel is excessive, persistent nausea or vomiting or for any other concerns:  Call your physician Dr.   Moore, Charles, MD    [phone] at [facility] or [phone] You may also ask to have the general doctor on call paged. They are available to you 24 hours a day.    SPECIAL INSTRUCTIONS / COMMENTS         FOLLOW-UP APPOINTMENTS   Please call your surgeon's office at the number listed about  to schedule a date / time of return.

## 2019-12-30 NOTE — Progress Notes (Signed)
Procedure: Phacoemulsification of cataract with lens implant right eye(s).    Pre-op diagnosis: Cataract right eye(s).      The patient has been thoroughly counseled as to the indications, alternatives, and potential risks of the procedures including, but not limited to, failure of operation, need for further surgery, scar, complications from anesthesia, loss of vision, infection, bleeding, corneal abrasion.    The patient understands the procedure with its risks and benefits, and desires to proceed as outlined above.    Kathleen Argue, MD 12/30/2019, 11:49

## 2019-12-30 NOTE — Anesthesia Preprocedure Evaluation (Signed)
ANESTHESIA PRE-OP EVALUATION  Planned Procedure: PHACO WITH INTRAOCULAR LENS (Right )  Review of Systems     anesthesia history negative     patient summary reviewed  nursing notes reviewed        Pulmonary   COPD and mild,   Cardiovascular    Hypertension, well controlled, CAD, CHF, ECG reviewed and 08/2019: Echo EF 60% no valve disease ,No peripheral edema,  Exercise Tolerance: > or = 4 METS   ,beta blocker therapy      GI/Hepatic/Renal    GERD and well controlled     Endo/Other    obesity,   type 2 diabetes/ stable     Neuro/Psych/MS    back abnormality and depression     Cancer  negative hematology/oncology ROS,                    Physical Assessment      Patient summary reviewed and Nursing notes reviewed   Airway       Mallampati: II    TM distance: >3 FB    Mouth Opening: good.            Dental                    Pulmonary    Breath sounds clear to auscultation  (-) no rhonchi, no decreased breath sounds, no wheezes, no rales and no stridor     Cardiovascular    Rhythm: regular  Rate: Normal  (-) no friction rub, carotid bruit is not present, no peripheral edema and no murmur     Other findings            Plan  ASA 3     Planned anesthesia type: MAC              Intravenous induction     Anesthesia issues/risks discussed are: PONV, Stroke, Intraoperative Awareness/ Recall, Aspiration and Cardiac Events/MI.  Anesthetic plan and risks discussed with patient.          Patient's NPO status is appropriate for Anesthesia.

## 2019-12-30 NOTE — OR Surgeon (Signed)
[place] OF OPHTHALMOLOGY   OPERATION SUMMARY     PATIENT NAME: Glenvar NUMBER: [id number]   DATE OF SERVICE: 12/30/2019   DATE OF BIRTH: 07/19/1946    PREOPERATIVE DIAGNOSIS: Cataract, right eye.Combined  H25.811    NAME OF PROCEDURE: Phacoemulsification of cataract with posterior chamber lens implant, right eye.  Cataract Removal with Implant   CPT [phone]  SURGEONS: Gwendalyn Ege MD (staff),  (assistant).   Modifier **54**  Date Relinquishing Care: tomorrow  Alyson Reedy  [phone]    ANESTHESIA: MAC, topical.   ESTIMATED BLOOD LOSS: Less than 1 drop.   COMPLICATIONS: There were no complications.   SPECIMENS: None.     DESCRIPTION OF PROCEDURE: In the preanesthesia area, the patient was given a drop of topical betadine and topical Tetracaine. The patient was taken back to the operating room suite.  Topical  Akten (lidocaine gel) was applied.  A surgical Time-Out confirmed the correct patient, procedure, operative eye, and IOL model and power.  The eye was then prepped and draped in the usual sterile fashion. A speculum was placed in the eye. A disposable supersharp blade was used to make a 1-mm side port entry through clear cornea along the limbus, and 1 mL of 1% preservative-free Xylocaine was irrigated into the anterior chamber. The anterior chamber was filled with viscoelastic material. The keratome was used to make a 2.4-mm phaco port incision through clear cornea temporally along the limbus. The cystotome and capsulorrhexis forceps were used to create a continuous curvilinear capsulorrhexis. Hydrodissection was performed with balanced salt solution on a cannula. The tip of the phacoemulsification handpiece was introduced into the eye, and the lens nucleus was emulsified using a bimanual nuclear splitting technique.  The phaco tip was removed and the tip of the I/A handpiece introduced into the eye. The remaining lens cortical material was aspirated  from the eye. The I/A tip was removed, and the capsular bag was inflated with viscoelastic material. The posterior chamber lens implant, SN60WF, of +24.50 diopters power was brought onto the field and inspected. It was without defect and was loaded into the lens injector. The tip of the injector was inserted into the eye, and the lens implant was placed in the capsular bag. The injector tip was removed and the tip of the I/A handpiece reintroduced into the eye. The remaining viscoelastic material was aspirated. The I/A tip was removed, and the anterior chamber was pressurized with balanced salt solution. The anterior wound margins were hydrated with balanced salt solution and 31m of VIGAMOX was instilled into the anterior chamber.  Weck-cel sponges were gently brushed against the incision and confirmed that there was no wound leakage.  The speculum was removed from the eye, and the eye was bandaged with a rigid shield. The patient was returned to same day surgery in excellent condition. Dr. MLaurance Flattenwas present for the key and critical portions of the case and immediately available at all times.        Resident   [place] Department of Ophthalmology     CKeene Breath MD   Assistant Professor   [facility]

## 2020-01-03 ENCOUNTER — Encounter (HOSPITAL_COMMUNITY): Payer: Self-pay | Admitting: Ophthalmology

## 2020-01-03 ENCOUNTER — Other Ambulatory Visit: Payer: Self-pay

## 2020-01-06 ENCOUNTER — Ambulatory Visit (HOSPITAL_COMMUNITY): Payer: Medicare PPO | Admitting: Certified Registered"

## 2020-01-06 ENCOUNTER — Encounter (HOSPITAL_COMMUNITY): Payer: Self-pay | Admitting: Ophthalmology

## 2020-01-06 ENCOUNTER — Inpatient Hospital Stay
Admission: RE | Admit: 2020-01-06 | Discharge: 2020-01-06 | Disposition: A | Discharge status: Home / Self Care | Payer: Medicare PPO | Source: Ambulatory Visit | Attending: Ophthalmology | Admitting: Ophthalmology

## 2020-01-06 ENCOUNTER — Encounter (HOSPITAL_COMMUNITY)
Admission: RE | Disposition: A | Discharge status: Home / Self Care | Payer: Self-pay | Source: Ambulatory Visit | Attending: Ophthalmology

## 2020-01-06 ENCOUNTER — Encounter (HOSPITAL_COMMUNITY): Payer: Medicare PPO | Admitting: Ophthalmology

## 2020-01-06 ENCOUNTER — Other Ambulatory Visit: Payer: Self-pay

## 2020-01-06 DIAGNOSIS — E1136 Type 2 diabetes mellitus with diabetic cataract: Secondary | ICD-10-CM | POA: Insufficient documentation

## 2020-01-06 DIAGNOSIS — H25812 Combined forms of age-related cataract, left eye: Secondary | ICD-10-CM | POA: Insufficient documentation

## 2020-01-06 DIAGNOSIS — Z9889 Other specified postprocedural states: Secondary | ICD-10-CM

## 2020-01-06 HISTORY — PX: HX CATARACT REMOVAL: SHX102

## 2020-01-06 LAB — POC BLOOD GLUCOSE (RESULTS): GLUCOSE, POC: 129 mg/dL — ABNORMAL HIGH (ref 60–110)

## 2020-01-06 SURGERY — PHACO WITH INTRAOCULAR LENS
Anesthesia: Monitor Anesthesia Care | Laterality: Left | Wound class: Clean Wound: Uninfected operative wounds in which no inflammation occurred

## 2020-01-06 MED ORDER — MOXIFLOXACIN 0.5% INJECTION FOR INTRACAMERAL USE
0.1000 mL | INJECTION | Freq: Once | Status: DC | PRN
Start: 2020-01-06 — End: 2020-01-06
  Administered 2020-01-06: .1 mL via INTRACAMERAL
  Filled 2020-01-06: qty 0.5

## 2020-01-06 MED ORDER — BRIMONIDINE 0.2 % EYE DROPS
1.0000 [drp] | Freq: Once | OPHTHALMIC | Status: DC | PRN
Start: 2020-01-06 — End: 2020-01-06
  Administered 2020-01-06: 09:00:00 1 [drp] via OPHTHALMIC

## 2020-01-06 MED ORDER — PREDNISOLONE ACETATE 1 % EYE DROPS,SUSPENSION
1.0000 [drp] | Freq: Four times a day (QID) | OPHTHALMIC | Status: DC
Start: 2020-01-06 — End: 2020-01-19

## 2020-01-06 MED ORDER — MIDAZOLAM (PF) 1 MG/ML INJECTION SOLUTION
Freq: Once | INTRAMUSCULAR | Status: DC | PRN
Start: 2020-01-06 — End: 2020-01-06
  Administered 2020-01-06: 2 mg via INTRAVENOUS

## 2020-01-06 MED ORDER — LIDOCAINE (PF) 10 MG/ML (1 %) INJECTION SOLUTION
INTRAMUSCULAR | Status: DC
Start: 2020-01-06 — End: 2020-01-06
  Filled 2020-01-06: qty 5

## 2020-01-06 MED ORDER — SODIUM CHLORIDE 0.9 % (FLUSH) INJECTION SYRINGE
10.00 mL | INJECTION | INTRAMUSCULAR | Status: DC | PRN
Start: 2020-01-06 — End: 2020-01-06

## 2020-01-06 MED ORDER — MIDAZOLAM (PF) 1 MG/ML INJECTION SOLUTION
INTRAMUSCULAR | Status: AC
Start: 2020-01-06 — End: 2020-01-06
  Filled 2020-01-06: qty 2

## 2020-01-06 MED ORDER — MOXIFLOXACIN 0.5 % EYE DROPS
1.0000 [drp] | OPHTHALMIC | Status: AC
Start: 2020-01-06 — End: 2020-01-06
  Administered 2020-01-06 (×4): 1 [drp] via OPHTHALMIC

## 2020-01-06 MED ORDER — EPINEPHRINE 1 MG/ML (1 ML) INJECTION SOLUTION
Freq: Once | INTRAOCULAR | Status: DC | PRN
Start: 2020-01-06 — End: 2020-01-06
  Administered 2020-01-06: 390 mL via INTRAOCULAR

## 2020-01-06 MED ORDER — BALANCED SALT SOLUTION COMBINATION NO.2 INTRAOCULAR IRRIGATION
Freq: Once | INTRAOCULAR | Status: DC | PRN
Start: 2020-01-06 — End: 2020-01-06
  Administered 2020-01-06: 09:00:00 15 mL via OPHTHALMIC

## 2020-01-06 MED ORDER — SODIUM CHLORIDE 0.9 % INTRAVENOUS SOLUTION
INTRAVENOUS | Status: DC | PRN
Start: 2020-01-06 — End: 2020-01-06

## 2020-01-06 MED ORDER — PREDNISOLONE ACETATE 1 % EYE DROPS,SUSPENSION
1.0000 [drp] | Freq: Once | OPHTHALMIC | Status: DC | PRN
Start: 2020-01-06 — End: 2020-01-06
  Filled 2020-01-06: qty 5

## 2020-01-06 MED ORDER — SODIUM CHLORIDE 0.9 % INTRAVENOUS SOLUTION
Freq: Once | INTRAVENOUS | Status: AC
Start: 2020-01-06 — End: 2020-01-06

## 2020-01-06 MED ORDER — EPINEPHRINE HCL (PF) 1 MG/ML (1 ML) INJECTION SOLUTION
INTRAMUSCULAR | Status: DC
Start: 2020-01-06 — End: 2020-01-06
  Filled 2020-01-06: qty 1

## 2020-01-06 MED ORDER — OFLOXACIN 0.3 % EYE DROPS
1.0000 [drp] | Freq: Four times a day (QID) | OPHTHALMIC | 0 refills | Status: DC
Start: 2020-01-06 — End: 2020-01-19

## 2020-01-06 MED ORDER — ACETAMINOPHEN 325 MG TABLET
650.00 mg | ORAL_TABLET | ORAL | Status: DC | PRN
Start: 2020-01-06 — End: 2020-01-06

## 2020-01-06 MED ORDER — SODIUM CHLORIDE 0.9 % (FLUSH) INJECTION SYRINGE
10.00 mL | INJECTION | Freq: Three times a day (TID) | INTRAMUSCULAR | Status: DC
Start: 2020-01-06 — End: 2020-01-06

## 2020-01-06 MED ORDER — TETRACAINE 0.5 % EYE DROPS
Freq: Once | OPHTHALMIC | Status: DC | PRN
Start: 2020-01-06 — End: 2020-01-06
  Administered 2020-01-06: 09:00:00 2 [drp] via OPHTHALMIC

## 2020-01-06 MED ORDER — BRIMONIDINE 0.2 % EYE DROPS
OPHTHALMIC | Status: DC
Start: 2020-01-06 — End: 2020-01-06
  Filled 2020-01-06: qty 5

## 2020-01-06 MED ORDER — MOXIFLOXACIN 0.5 % EYE DROPS
OPHTHALMIC | Status: DC
Start: 2020-01-06 — End: 2020-01-06
  Filled 2020-01-06: qty 3

## 2020-01-06 MED ORDER — CYCLOPENT 1 %-TROPICAMIDE 1 %-PROPARACAINE 0.1 %-PE-KETOROLAC EYE DROP
1.0000 [drp] | OPHTHALMIC | Status: AC
Start: 2020-01-06 — End: 2020-01-06
  Administered 2020-01-06 (×4): 1 [drp] via OPHTHALMIC
  Filled 2020-01-06: qty 1

## 2020-01-06 MED ORDER — TIMOLOL MALEATE 0.5 % EYE DROPS
OPHTHALMIC | Status: DC
Start: 2020-01-06 — End: 2020-01-06
  Filled 2020-01-06: qty 5

## 2020-01-06 MED ORDER — LIDOCAINE (PF) 10 MG/ML (1 %) INJECTION SOLUTION
2.0000 mL | Freq: Once | INTRAMUSCULAR | Status: DC | PRN
Start: 2020-01-06 — End: 2020-01-06
  Administered 2020-01-06: 1.5 mL via INTRAOCULAR

## 2020-01-06 MED ORDER — TIMOLOL MALEATE 0.5 % ONCE DAILY EYE DROPS
1.0000 [drp] | Freq: Once | OPHTHALMIC | Status: DC | PRN
Start: 2020-01-06 — End: 2020-01-06
  Administered 2020-01-06: 1 [drp] via OPHTHALMIC
  Filled 2020-01-06: qty 2.5

## 2020-01-06 MED ORDER — TETRACAINE 0.5 % EYE DROPS
OPHTHALMIC | Status: DC
Start: 2020-01-06 — End: 2020-01-06
  Filled 2020-01-06: qty 5

## 2020-01-06 SURGICAL SUPPLY — 34 items
CANNULA IRRG 4MM 30GA (VASCULAR) IMPLANT
CANNULA OPTH 8MM 25GA HDSCT STRL DISP (OPHTHALMIC SUPPLIES (NOT LENS)) IMPLANT
CART LEN MNRCH III STRL DISP (OPHTHALMIC SUPPLIES (NOT LENS)) IMPLANT
CONV USE 48189 - SYRINGE MONOJECT 1ML LF  STRL REG TIP GRAD TB SIL POLYPROP EPOXY STD DISP (Syringes w/ o Needles) ×1 IMPLANT
CONV USE ITEM 321846 - GLOVE SURG 7.5 LF PF BEAD CUF_STRL 12IN PROTEXIS PI PLISPRN (GLOVES AND ACCESSORIES) ×1 IMPLANT
CONV USE ITEM 321863 - GLOVE SURG 6.5 LF PF SMTH BE_AD CUF STRL NTR 12IN PROTEXIS (GLOVES AND ACCESSORIES) ×2 IMPLANT
CYSTO IRR 25GA ANG FRM RVRS CUT STRL DISP (SURGICAL INSTRUMENTS) IMPLANT
DEVICE VISCOELAS DUOVISC KIT SOL (PHAR) ×1 IMPLANT
DISC USE ITEM 162365 - NEEDLE FLTR 1.5IN 19GA REG BVL_LL HUB DEHP-FR BD STRL LF (NEEDLES & SYRINGE SUPPLIES) ×1 IMPLANT
DISCONTINUED USE  318857 - LENS IOL 0 D +24 DIOP MOD L PLANAR ACRYSOF STABLEFORCE ULTRASERT TENSIONGLIDE ASPH UV BLU LIGHT ×1 IMPLANT
GLOVE EXM MED LF STRL SNSCR_VNYL PF SMTH BEAD CUF 9.5IN (GLOVES AND ACCESSORIES) IMPLANT
GLOVE SURG 7.5 LF PF BEAD CUF STRL 12IN PROTEXIS PI PLISPRN (GLOVES AND ACCESSORIES) ×1
GOWN SURG XL L3 REINF SET IN SLEEVE STRL LF  DISP BLU ASTND FBRC (GOWN) ×1 IMPLANT
GOWN SURG XL XLNG L4 RGLN SLEEVE BRTHBL STRL LF  DISP SMARTGOWN (PROTECTIVE PRODUCTS/GARMENTS) IMPLANT
HYDROSECTION ANG 25GA 8065441420 (OPTHALMIC SUPPLIES (NOT LENS))
KNIFE OPTH 2.4MM INTREPID ANG BVL STAB MICRO COAX SLT STRL DISP (OPHTHALMIC SUPPLIES (NOT LENS)) IMPLANT
KNIFE OPTH 2.4MM INTREPID ANG_BVL STAB MICRO COAX SLT STRL (OPTHALMIC SUPPLIES (NOT LENS))
MBO USE ITEM 306904 - GOWN SURG XL L3 REINF SET IN SLEEVE STRL LF  DISP BLU ASTND FBRC (GOWN) ×1
NEEDLE HYPO 18GA 1IN EDGE NEE DLEPRO JELCO BVL 1 HAND ACT (NEEDLES & SYRINGE SUPPLIES) ×1
NEEDLE HYPO 18GA 1IN EDGE NEE_DLEPRO JELCO BVL 1 HAND ACT (NEEDLES & SYRINGE SUPPLIES) ×1 IMPLANT
PACK CUSTOM CATARACT ALCON (TRAY) ×1 IMPLANT
PEN SURG MRKNG WRITESITE + RLR LBL SET 3X DARKER FORMULATE GNTN VIOL INK STRL LF  CHLRPRP (MISCELLANEOUS PT CARE ITEMS) IMPLANT
PEN SURG MRKNG WRITESITE + SKN RLR LBL SET 3X DARKER (MISCELLANEOUS PT CARE ITEMS)
SHIELD EYE 6.75X17IN 5.75IN ASST TWR TIDISHIELD RSPS DISPENSR LEN FRM DISP LF (Other Miscellaneous) ×1 IMPLANT
SOL IRRG 0.9% NACL 1000ML PLASTIC PR BTL ISTNC N-PYRG STRL LF (SOLUTIONS) ×1 IMPLANT
SOL IRRG BSS MLT ELCLY GLS CONTAINR 500ML (SOLUTIONS) ×1 IMPLANT
SOL IRRG BSS PLAIN 15ML STRL L (SUPP) ×1 IMPLANT
SOL IV 0.9% NACL 10ML DEHP-FR PRSV FR 1 DS PLASTIC FLIPTOP VIAL USP LF  LIGHT GRN (IV TUBING & ACCESSORIES) ×1 IMPLANT
SOL PREP BDINE 10% PVP 4OZ BTL PREOP SKIN PREP (DIS) ×1 IMPLANT
SOL PRP BDINE 10% PVP 4OZ BTL PREOP SKN PREP (DIS) ×1
SYRINGE LL 3ML LF  STRL GRAD N-PYRG DEHP-FR PVC FREE MED DISP CLR (NEEDLES & SYRINGE SUPPLIES) ×1 IMPLANT
SYRINGE MONOJECT 1ML LF STRL REG TIP GRAD TB SIL POLYPROP (Syringes w/ o Needles) ×1
SYRINGE MONOJECT 1ML LF STRL_REG TIP GRAD TB SIL POLYPROP (Syringes w/ o Needles) ×1
TIP SUCT/IRRG .03MM INTREPID 35D BNT PLMR DISP (OPHTHALMIC SUPPLIES (NOT LENS)) IMPLANT

## 2020-01-06 NOTE — Nurses Notes (Signed)
D/C instructions reviewed with patient and spouse.

## 2020-01-06 NOTE — Anesthesia Preprocedure Evaluation (Addendum)
ANESTHESIA PRE-OP EVALUATION  Planned Procedure: PHACO WITH INTRAOCULAR LENS (Left )  Review of Systems     anesthesia history negative     patient summary reviewed  nursing notes reviewed        Pulmonary   COPD,   Cardiovascular    Hypertension, CAD, CHF, ECG reviewed and 08/2019 Echo EF 60% no valve disease ,No peripheral edema,        GI/Hepatic/Renal    GERD     Endo/Other    obesity,   type 2 diabetes     Neuro/Psych/MS    depression     Cancer  negative hematology/oncology ROS,                  Physical Assessment      Patient summary reviewed and Nursing notes reviewed   Airway       Mallampati: II    TM distance: >3 FB    Neck ROM: full  Mouth Opening: good.            Dental                    Pulmonary    Breath sounds clear to auscultation  (-) no rhonchi, no decreased breath sounds, no wheezes, no rales and no stridor     Cardiovascular    Rhythm: regular  Rate: Normal  (-) no friction rub, carotid bruit is not present, no peripheral edema and no murmur     Other findings            Plan  ASA 3     Planned anesthesia type: MAC              Intravenous induction     Anesthesia issues/risks discussed are: PONV, Stroke, Intraoperative Awareness/ Recall, Aspiration and Cardiac Events/MI.  Anesthetic plan and risks discussed with patient.          Patient's NPO status is appropriate for Anesthesia.

## 2020-01-06 NOTE — Discharge Instructions (Signed)
SURGICAL DISCHARGE INSTRUCTIONS     Dr. Kathleen Argue, MD  performed your [facility] WITH INTRAOCULAR LENS today at the [facility]    [facility]  Fridays 7:30 am - 4:30 pm  [phone]    PLEASE SEE WRITTEN HANDOUTS AS DISCUSSED BY YOUR NURSE:  Aura Fey RN    SIGNS AND SYMPTOMS OF A WOUND / INCISION INFECTION   Be sure to watch for the following:   Increase in redness or red streaks near or around the wound or incision.   Increase in pain that is intense or severe and cannot be relieved by the pain medication that your doctor has given you.   Increase in swelling that cannot be relieved by elevation of a body part, or by applying ice, if permitted.   Increase in drainage, or if yellow / green in color and smells bad. This could be on a dressing or a cast.   Increase in fever for longer than 24 hours, or an increase that is higher than 101 degrees Fahrenheit (normal body temperature is 98 degrees Fahrenheit). The incision may feel warm to the touch.    **CALL YOUR DOCTOR IF ONE OR MORE OF THESE SIGNS / SYMPTOMS SHOULD OCCUR.    ANESTHESIA INFORMATION     SURGICAL DISCHARGE INSTRUCTIONS     Dr. Kathleen Argue, MD  performed your [facility] WITH INTRAOCULAR LENS today at the [facility]    [facility] ([place]  Monday through Friday 8:00 am - 4:00 pm  [phone]  Between 4:00 pm and 8:00 am, weekends and holidays:  Call [facility] at [phone]      PLEASE SEE WRITTEN HANDOUTS AS DISCUSSED BY YOUR NURSE:  Aura Fey RN     SIGNS AND SYMPTOMS OF A WOUND / INCISION INFECTION   Be sure to watch for the following:   Increase in redness or red streaks near or around the wound or incision.   Increase in pain that is intense or severe and cannot be relieved by the pain medication that your doctor has given you.   Increase in swelling that cannot be relieved by elevation of a body part, or by applying ice, if permitted.   Increase in drainage, or if  yellow / green in color and smells bad. This could be on a dressing or a cast.   Increase in fever for longer than 24 hours, or an increase that is higher than 101 degrees Fahrenheit (normal body temperature is 98 degrees Fahrenheit). The incision may feel warm to the touch.    **CALL YOUR DOCTOR IF ONE OR MORE OF THESE SIGNS / SYMPTOMS SHOULD OCCUR.    ANESTHESIA INFORMATION   ANESTHESIA -- ADULT PATIENTS:  You have received intravenous sedation / general anesthesia, and you may feel drowsy and light-headed for several hours. You may even experience some forgetfulness of the procedure. DO NOT DRIVE A MOTOR VEHICLE or perform any activity requiring complete alertness or coordination until you feel fully awake in about 24-48 hours. Do not drink alcoholic beverages for at least 24 hours. Do not stay alone, you must have a responsible adult available to be with you. You may also experience a dry mouth or nausea for 24 hours. This is a normal side effect and will disappear as the effects of the medication wear off.    REMEMBER   If you experience any difficulty breathing, chest pain, bleeding that you feel is  excessive, persistent nausea or vomiting or for any other concerns:  Call your physician Dr.  Gwendalyn Ege, MD at [phone] You may also ask to have the general doctor on call paged. They are available to you 24 hours a day.    SPECIAL INSTRUCTIONS / COMMENTS       FOLLOW-UP APPOINTMENTS   Please call your surgeon's office at the number listed about  to schedule a date / time of return.     REMEMBER   If you experience any difficulty breathing, chest pain, bleeding that you feel is excessive, persistent nausea or vomiting or for any other concerns:  Call your physician Dr.  Gwendalyn Ege, MD at [phone] You may also ask to have the general doctor on call paged. They are available to you 24 hours a day.    SPECIAL INSTRUCTIONS / COMMENTS       FOLLOW-UP APPOINTMENTS   Please call your surgeon's office  at the number listed about  to schedule a date / time of return.

## 2020-01-06 NOTE — Discharge Summary (Signed)
Discharge Note  Patient meets discharge criteria  Discharge to home  Return to [facility] as scheduled or sooner if need be  Keep surgical dressing in place    Kathleen Argue, MD 01/06/2020, 09:04

## 2020-01-06 NOTE — OR Surgeon (Signed)
[place] OF OPHTHALMOLOGY   OPERATION SUMMARY     PATIENT NAME: Joanna Black NUMBER: [id number]   DATE OF SERVICE: 01/06/2020   DATE OF BIRTH: 1946/10/19    PREOPERATIVE DIAGNOSIS: Cataract, left eye.Combined  H25.812  NAME OF PROCEDURE: Phacoemulsification of cataract with posterior chamber lens implant, left eye.  Cataract Removal with Implant   CPT [phone]  SURGEONS: Gwendalyn Ege MD (staff),  (assistant).   Modifier **54**  Date Relinquishing Care: tomorrow  Alyson Reedy  [phone]    ANESTHESIA: MAC, topical.   ESTIMATED BLOOD LOSS: Less than 1 drop.   COMPLICATIONS: There were no complications.   SPECIMENS: None.     DESCRIPTION OF PROCEDURE: In the preanesthesia area, the patient was given a drop of topical betadine and topical Tetracaine. The patient was taken back to the operating room suite.  Topical Akten (lidocaine gel) was applied.  A surgical Time-Out confirmed the correct patient, procedure, operative eye, and IOL model and power.  The eye was then prepped and draped in the usual sterile fashion. A speculum was placed in the eye. A disposable supersharp blade was used to make a 1-mm side port entry through clear cornea along the limbus, and 1 mL of 1% preservative-free Xylocaine was irrigated into the anterior chamber. The anterior chamber was filled with viscoelastic material. The keratome was used to make a 2.4-mm phaco port incision through clear cornea temporally along the limbus. The cystotome and capsulorrhexis forceps were used to create a continuous curvilinear capsulorrhexis. Hydrodissection was performed with balanced salt solution on a cannula. The tip of the phacoemulsification handpiece was introduced into the eye, and the lens nucleus was emulsified using a bimanual nuclear splitting technique.  The phaco tip was removed and the tip of the I/A handpiece introduced into the eye. The remaining lens cortical material was aspirated from  the eye. The I/A tip was removed, and the capsular bag was inflated with viscoelastic material. The posterior chamber lens implant, SN60WF, of +24.00 diopters power was brought onto the field and inspected. It was without defect and was loaded into the lens injector. The tip of the injector was inserted into the eye, and the lens implant was placed in the capsular bag. The injector tip was removed and the tip of the I/A handpiece reintroduced into the eye. The remaining viscoelastic material was aspirated. The I/A tip was removed, and the anterior chamber was pressurized with balanced salt solution. The anterior wound margins were hydrated with balanced salt solution and 59m of VIGAMOX  was instilled into the anterior chamber.  Weck-cel sponges were gently brushed against the incision and confirmed that there was no wound leakage.  The speculum was removed from the eye, and the eye was bandaged with a rigid shield. The patient was returned to same day surgery in excellent condition. Dr. MLaurance Flattenwas present for the key and critical portions of the case and immediately available at all times.        Resident   [place] Department of Ophthalmology     CKeene Breath MD   Assistant Professor   WProm[facility] HospitalDepartment of Ophthalmology

## 2020-01-06 NOTE — Progress Notes (Signed)
Procedure: Phacoemulsification of cataract with lens implant left eye(s).    Pre-op diagnosis: Cataract left eye(s).      The patient has been thoroughly counseled as to the indications, alternatives, and potential risks of the procedures including, but not limited to, failure of operation, need for further surgery, scar, complications from anesthesia, loss of vision, infection, bleeding, corneal abrasion.    The patient understands the procedure with its risks and benefits, and desires to proceed as outlined above.    Kathleen Argue, MD 01/06/2020, 07:43

## 2020-01-06 NOTE — Anesthesia Postprocedure Evaluation (Signed)
Anesthesia Post Op Evaluation    Patient: Joanna Black  Procedure(s):  PHACO WITH INTRAOCULAR LENS    Last Vitals:Temperature: 36.5 C (97.7 F) (01/06/20 0756)  Heart Rate: 61 (01/06/20 0756)  BP (Non-Invasive): 128/64 (01/06/20 0756)  Respiratory Rate: 19 (01/06/20 0756)  SpO2: 99 % (01/06/20 0756)    Patient is sufficiently recovered from the effects of anesthesia to participate in the evaluation and has returned to their pre-procedure level.  Patient location during evaluation: PACU       Patient participation: complete - patient participated  Level of consciousness: awake and alert and responsive to verbal stimuli    Pain score: 0  Pain management: adequate  Airway patency: patent    Anesthetic complications: no  Cardiovascular status: acceptable  Respiratory status: acceptable  Hydration status: acceptable  Patient post-procedure temperature: Pt Normothermic   PONV Status: Absent

## 2020-01-06 NOTE — H&P (Signed)
[facility]  [facility]  History and Physical      Date:  01/06/2020   Joanna Black, 74 y.o. female  Date of Birth:  [birth year]    Complaint:   No chief complaint on file.    HPI: Joanna Black is a 74 y.o., female who presents with  No diagnosis found..         Past Medical History:   Diagnosis Date   . Arthritis    . Back problem    . CAD (coronary artery disease)     mild   . CHF (congestive heart failure) (CMS HCC)    . Chronic bronchitis with emphysema (CMS HCC)    . Chronic low back pain    . Claustrophobia    . Depression    . Diabetes (CMS HCC)    . Esophageal reflux    . Essential hypertension 01/28/2019   . Hypercholesterolemia    . Hypertension    . Hypertriglyceridemia    . Type 2 diabetes mellitus (CMS HCC)          Allergies   Allergen Reactions   . Amoxicillin    . Augmentin [Amoxicillin-Pot Clavulanate] Nausea/ Vomiting   . Statins-Hmg-Coa Reductase Inhibitors Myalgia     No current facility-administered medications for this encounter.       Social History     Tobacco Use   . Smoking status: Never Smoker   . Smokeless tobacco: Never Used   Substance Use Topics   . Alcohol use: No     Alcohol/week: 0.0 standard drinks     Family Medical History:     Problem Relation (Age of Onset)    Bone cancer Father    Diabetes Multiple family members    Hypertension (High Blood Pressure) Multiple family members    No Known Problems Mother    Stomach Cancer Paternal Grandmother              PJK:DTOIZ than ROS in the HPI, all other systems were negative.    EXAM:    General: appears in good health  Eyes: Performed at [facility].    HENT:ENMT without erythema or injection, mucous membranes moist.  Neck: no thyromegaly or lymphadenopathy  Lungs: Clear to auscultation bilaterally.   Cardiovascular: regular rate and rhythm: Soft, non-tender  Extremities: No cyanosis or edema    ASSESSMENT/PLAN:    CATARACT OS, plan CE W/IOL OS    Kathleen Argue, MD 01/06/2020, 07:42

## 2020-01-17 ENCOUNTER — Other Ambulatory Visit (INDEPENDENT_AMBULATORY_CARE_PROVIDER_SITE_OTHER): Payer: Self-pay | Admitting: Family

## 2020-01-17 MED ORDER — METFORMIN ER 750 MG TABLET,EXTENDED RELEASE 24 HR
750.0000 mg | ORAL_TABLET | Freq: Every day | ORAL | 1 refills | Status: DC
Start: 2020-01-17 — End: 2020-04-17

## 2020-01-19 ENCOUNTER — Other Ambulatory Visit: Payer: Self-pay

## 2020-01-19 ENCOUNTER — Encounter (INDEPENDENT_AMBULATORY_CARE_PROVIDER_SITE_OTHER): Payer: Self-pay | Admitting: Family

## 2020-01-19 ENCOUNTER — Ambulatory Visit: Payer: Medicare PPO | Attending: Family | Admitting: Family

## 2020-01-19 ENCOUNTER — Ambulatory Visit: Payer: Medicare PPO | Attending: Family

## 2020-01-19 ENCOUNTER — Other Ambulatory Visit (HOSPITAL_COMMUNITY): Payer: Medicare PPO | Admitting: Family

## 2020-01-19 VITALS — BP 112/72 | HR 93 | Temp 96.8°F | Ht 65.0 in | Wt 208.0 lb

## 2020-01-19 DIAGNOSIS — E1122 Type 2 diabetes mellitus with diabetic chronic kidney disease: Secondary | ICD-10-CM | POA: Insufficient documentation

## 2020-01-19 DIAGNOSIS — N1831 Chronic kidney disease, stage 3a: Secondary | ICD-10-CM | POA: Insufficient documentation

## 2020-01-19 DIAGNOSIS — E781 Pure hyperglyceridemia: Secondary | ICD-10-CM | POA: Insufficient documentation

## 2020-01-19 DIAGNOSIS — E119 Type 2 diabetes mellitus without complications: Secondary | ICD-10-CM

## 2020-01-19 DIAGNOSIS — Z7984 Long term (current) use of oral hypoglycemic drugs: Secondary | ICD-10-CM | POA: Insufficient documentation

## 2020-01-19 DIAGNOSIS — I10 Essential (primary) hypertension: Secondary | ICD-10-CM | POA: Insufficient documentation

## 2020-01-19 DIAGNOSIS — I129 Hypertensive chronic kidney disease with stage 1 through stage 4 chronic kidney disease, or unspecified chronic kidney disease: Secondary | ICD-10-CM | POA: Insufficient documentation

## 2020-01-19 LAB — CBC
HCT: 39.3 % (ref 36.0–47.0)
HGB: 13.2 g/dL (ref 12.0–15.0)
MCH: 31.5 pg (ref 27.0–32.0)
MCHC: 33.6 g/dL (ref 32.0–36.0)
MCV: 93.8 fL (ref 80.0–100.0)
MPV: 10.6 fL — ABNORMAL HIGH (ref 6.6–9.3)
PLATELETS: 234 10*3/uL (ref 140–450)
RBC: 4.19 10*6/uL — ABNORMAL LOW (ref 4.20–5.40)
RDW: 14.7 % (ref 12.0–15.0)
WBC: 7.5 10*3/uL (ref 4.8–10.8)

## 2020-01-19 LAB — COMPREHENSIVE METABOLIC PNL, FASTING
ALBUMIN: 4 g/dL (ref 3.4–4.8)
ALKALINE PHOSPHATASE: 97 U/L (ref 55–145)
ALT (SGPT): 14 U/L (ref 8–22)
ANION GAP: 9 mmol/L (ref 4–13)
AST (SGOT): 16 U/L (ref 8–45)
BILIRUBIN TOTAL: 0.5 mg/dL (ref 0.3–1.3)
BUN/CREA RATIO: 13 (ref 6–22)
BUN: 17 mg/dL (ref 8–25)
CALCIUM: 9.5 mg/dL (ref 8.8–10.2)
CHLORIDE: 102 mmol/L (ref 96–111)
CO2 TOTAL: 30 mmol/L (ref 23–31)
CREATININE: 1.31 mg/dL — ABNORMAL HIGH (ref 0.60–1.05)
ESTIMATED GFR: 40 mL/min/BSA — ABNORMAL LOW (ref >=60)
GLUCOSE: 121 mg/dL (ref 65–125)
POTASSIUM: 4.8 mmol/L (ref 3.5–5.1)
PROTEIN TOTAL: 7.7 g/dL (ref 6.0–8.0)
SODIUM: 141 mmol/L (ref 136–145)

## 2020-01-19 LAB — LIPID PANEL
CHOL/HDL RATIO: 7.1
CHOLESTEROL: 314 mg/dL — ABNORMAL HIGH (ref 100–200)
HDL CHOL: 44 mg/dL — ABNORMAL LOW (ref >=50)
LDL CALC: 194 mg/dL — ABNORMAL HIGH (ref <100)
NON-HDL: 270 mg/dL — ABNORMAL HIGH (ref <=190)
TRIGLYCERIDES: 380 mg/dL — ABNORMAL HIGH (ref <150)
VLDL CALC: 76 mg/dL — ABNORMAL HIGH (ref <30)

## 2020-01-19 LAB — HGA1C (HEMOGLOBIN A1C WITH EST AVG GLUCOSE)
ESTIMATED AVERAGE GLUCOSE: 143 mg/dL
HEMOGLOBIN A1C: 6.6 % — ABNORMAL HIGH (ref 4.8–6.2)

## 2020-01-19 MED ORDER — IRBESARTAN 150 MG TABLET
150.0000 mg | ORAL_TABLET | Freq: Every day | ORAL | 0 refills | Status: DC
Start: 2020-01-19 — End: 2020-02-16

## 2020-01-19 MED ORDER — METOPROLOL SUCCINATE ER 25 MG TABLET,EXTENDED RELEASE 24 HR
25.00 mg | ORAL_TABLET | Freq: Every evening | ORAL | 1 refills | Status: DC
Start: 2020-01-19 — End: 2020-11-29

## 2020-01-19 NOTE — Patient Instructions (Addendum)
AMBER SHADE SUNGLASSES POLARIZED AND UV     CONSIDER DIMMER SWITCH    --------------------------------------------    WHEN TO CHECK BLOOD PRESSURE  Check blood pressure once or twice a week after it is controlled.  Be sure to check blood pressure in the morning as well as evening. Don't get in habit of always checking BP at the same time  Check blood pressure more often during the following situations:  1.  With any change in blood pressure medication  2.  Before and after surgical procedures   3.  After discharge from the hospital or ER due to recent illness or cardiovascular event (stroke, heart attack)  4. Any time you have symptoms of high blood pressure - which include but is not limited to headache, flushing, fatigue, ear ringing or pressure, leg swelling, lightheadedness                           BP should be 125-135 / 60-80

## 2020-01-19 NOTE — Progress Notes (Signed)
[facility]  [address]  [place], [place] [address]  P: [phone]     Name: Joanna Black MRN:  [id number]   Date: 01/19/2020 Age: 74 y.o.      Chief Complaint: Follow-up    History of Present Illness              Had cataract surgery both eyes, now light bothers eyes    Patient is here for routine follow-up of chronic condition, Pt has trouble mainly with lower back, intermittent; once had spinal injection at [facility] clinic. Usually resolves on own.     She recently had esophageal dilatation July 2020 at [facility], her upper GI done at the same time was normal other than a small amount gastritis    She had cardiac workup in [place] at [facility] while she was visiting her son 07/2018, see scanned media report, Lexiscan nuclear stress was negative for ischemia and showed ejection fraction of 62% with no motion abnormalities, see scanned media . Her serial troponins were negative, all other blood work was negative, her chest x-ray was negative.  Heart catheterization was    She also complains of back pain which has been ongoing for many years radiates to hip and knee on left side, lumbar x-ray last year demonstrated mild degenerative changes anterolisthesis at L4 through 5; she had had a lumbar MRI 2015 and saw Dr. 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  Lower back quit hurting by the time she made it there, so no further w/u    Diabetes-on metformin;had hard time taking statinother then pravacholcurrently on none, she is aware of diabetic guidelines that recommend statin use, she said she would think about it  Lab A1C Results:  HEMOGLOBIN A1C   Date Value Ref Range Status   09/06/2019 7.0 (H) 4.8 - 6.2 % Final   01/29/2019 7.3 (H) 4.8 - 6.2 % Final     CHF-by patient report she has acute exacerbations, BNP can go up to 600; EF when asymptomatic 65%; takes spironolactone BNP in February 2020 was 130, this was  baseline    Hypertension-has home cuff, runs normal; controlled Cath 02/2016 mild disease; does have BP cuff at home    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  Feel that when her heart rate increases it fights against stiff/scarred lungs - such as when she was at the beach in hot weather and developed heart failure symptoms.     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 Arthritis    . Back problem    .  CAD (coronary artery disease)     mild   . Cataracts, bilateral    . CHF (congestive heart failure) (CMS HCC)    . Chronic bronchitis with emphysema (CMS HCC)    . Chronic low back pain    . Claustrophobia    . Depression    . Diabetes (CMS HCC)    . Esophageal reflux    . Essential hypertension 01/28/2019   . Hypercholesterolemia    . Hypertension    . Hypertriglyceridemia    . Type 2 diabetes mellitus (CMS HCC)         Reference Surgical History Within present EMR     Current Outpatient Medications   Medication Sig   . acetaminophen (TYLENOL) 325 mg Oral Tablet Take 325 mg by mouth Every night Takes 2 at night   . albuterol sulfate (PROVENTIL OR VENTOLIN OR PROAIR) 90 mcg/actuation Inhalation HFA Aerosol Inhaler Take 1-2 Puffs by inhalation Every 6 hours as needed   . Blood Sugar Diagnostic (BLOOD GLUCOSE TEST) Strip  Use to test blood glucose once daily.  Dx E11.9 (Patient not taking: Reported on 01/19/2020)   . FLUoxetine (PROZAC) 20 mg Oral Capsule TAKE 1 CAPSULE BY MOUTH EVERY DAY   . irbesartan (AVAPRO) 150 mg Oral Tablet Take 1 Tab (150 mg total) by mouth Once a day Stop losartsn-hydrochlorothiazide   . metformin (GLUCOPHAGE-XR) 750 mg Oral Tablet Sustained Release 24 hr Take 1 Tab (750 mg total) by mouth Once a day   . metoprolol succinate (TOPROL-XL) 25 mg Oral Tablet Sustained Release 24 hr Take 1 Tab (25 mg total) by mouth Every night   . multivitamin Oral Tablet Take 1 Tab by mouth   . omeprazole (PRILOSEC) 40 mg Oral Capsule, Delayed Release(E.C.) Take 40 mg by mouth Once a day   . phenazopyridine (PYRIDIUM) 200 mg Oral Tablet Take 1 Tab (200 mg total) by mouth Three times a day as needed (urinary pain/bladder spasms)   . psyllium husk (METAMUCIL ORAL) Take by mouth   . tiZANidine (ZANAFLEX) 4 mg Oral Tablet TAKE 1 TABLET THREE TIMES A DAY AS NEEDED FOR MUSCLE CRAMPS     Allergies   Allergen Reactions   . Amoxicillin    . Augmentin [Amoxicillin-Pot Clavulanate] Nausea/ Vomiting   . Statins-Hmg-Coa Reductase Inhibitors Myalgia       Family Medical History:     Problem Relation (Age of Onset)    Bone cancer Father    Diabetes Multiple family members    Hypertension (High Blood Pressure) Multiple family members    No Known Problems Mother    Stomach Cancer Paternal Grandmother            Social History     Tobacco Use   . Smoking status: Never Smoker   . Smokeless tobacco: Never Used   Substance Use Topics   . Alcohol use: No     Alcohol/week: 0.0 standard drinks   . Drug use: No         Review of Systems:             Other than ROS in the HPI, all other systems were negative.  Examination:  BP 112/72 (Site: Left, Patient Position: Sitting, Cuff Size: Adult)   Pulse 93   Temp 36 C (96.8 F) (Temporal)   Ht 1.651 m (5\' 5" )   Wt 94.3 kg (208 lb)   SpO2 94%   BMI 34.61 kg/m       Nursing note reviewed;  Vital signs  reviewed  General: Alert and oriented to person place and time.   Eyes: Pupils equal and round, reactive to light and accomodation.   HENT:  Normocephalic  Lungs: non-labored   Cardiovascular: regular rate   MS: active ROM BUE and BLE  Skin: Skin warm and dry   Neurologic: gross and fine motor intact,  independent ambulation  Lymphatics: No lymphadenopathy  Psychiatric: Normal affect, behavior is appropriate    Data reviewed:                       Assessment and Plan:                                    Dwan was seen today for follow-up.    Diagnoses and all orders for this visit:    Essential hypertension  -     irbesartan (AVAPRO) 150 mg Oral Tablet; Take 1 Tab (150 mg total) by mouth Once a day Stop losartsn-hydrochlorothiazide  -     metoprolol succinate (TOPROL-XL) 25 mg Oral Tablet Sustained Release 24 hr; Take 1 Tab (25 mg total) by mouth Every night  -     CBC; Future    Diabetes (CMS HCC)  -     CBC; Future  -     LIPID PANEL; Future  -     HGA1C (HEMOGLOBIN A1C WITH EST AVG GLUCOSE); Future    Stage 3a chronic kidney disease  -     CBC; Future  -     COMPREHENSIVE METABOLIC PNL, FASTING; Future    Hypertriglyceridemia  -     LIPID PANEL; Future        Follow Up:                                                             Return in about 4 months (around 05/18/2020).       [employer] [facility], CFNP

## 2020-01-25 ENCOUNTER — Other Ambulatory Visit (INDEPENDENT_AMBULATORY_CARE_PROVIDER_SITE_OTHER): Payer: Self-pay

## 2020-02-16 ENCOUNTER — Ambulatory Visit: Payer: Medicare PPO | Attending: Family | Admitting: Family

## 2020-02-16 ENCOUNTER — Encounter (INDEPENDENT_AMBULATORY_CARE_PROVIDER_SITE_OTHER): Payer: Self-pay | Admitting: Family

## 2020-02-16 ENCOUNTER — Other Ambulatory Visit: Payer: Self-pay

## 2020-02-16 VITALS — BP 120/60 | HR 81 | Temp 98.5°F | Resp 18 | Ht 65.0 in | Wt 210.4 lb

## 2020-02-16 DIAGNOSIS — I130 Hypertensive heart and chronic kidney disease with heart failure and stage 1 through stage 4 chronic kidney disease, or unspecified chronic kidney disease: Secondary | ICD-10-CM | POA: Insufficient documentation

## 2020-02-16 DIAGNOSIS — Z713 Dietary counseling and surveillance: Secondary | ICD-10-CM | POA: Insufficient documentation

## 2020-02-16 DIAGNOSIS — N183 Chronic kidney disease, stage 3 unspecified: Secondary | ICD-10-CM | POA: Insufficient documentation

## 2020-02-16 DIAGNOSIS — R5383 Other fatigue: Secondary | ICD-10-CM | POA: Insufficient documentation

## 2020-02-16 DIAGNOSIS — R232 Flushing: Secondary | ICD-10-CM | POA: Insufficient documentation

## 2020-02-16 DIAGNOSIS — I10 Essential (primary) hypertension: Secondary | ICD-10-CM

## 2020-02-16 DIAGNOSIS — E119 Type 2 diabetes mellitus without complications: Secondary | ICD-10-CM

## 2020-02-16 DIAGNOSIS — Z6835 Body mass index (BMI) 35.0-35.9, adult: Secondary | ICD-10-CM | POA: Insufficient documentation

## 2020-02-16 DIAGNOSIS — E1122 Type 2 diabetes mellitus with diabetic chronic kidney disease: Secondary | ICD-10-CM | POA: Insufficient documentation

## 2020-02-16 DIAGNOSIS — N951 Menopausal and female climacteric states: Secondary | ICD-10-CM | POA: Insufficient documentation

## 2020-02-16 DIAGNOSIS — Z7984 Long term (current) use of oral hypoglycemic drugs: Secondary | ICD-10-CM | POA: Insufficient documentation

## 2020-02-16 HISTORY — DX: Body mass index (BMI) 35.0-35.9, adult: Z68.35

## 2020-02-16 MED ORDER — IRBESARTAN 75 MG TABLET
75.0000 mg | ORAL_TABLET | Freq: Every day | ORAL | 1 refills | Status: DC
Start: 2020-02-16 — End: 2020-08-18

## 2020-02-16 MED ORDER — ESTRADIOL 0.01% (0.1 MG/GRAM) VAGINAL CREAM
TOPICAL_CREAM | VAGINAL | 1 refills | Status: DC
Start: 2020-02-16 — End: 2020-04-27

## 2020-02-16 NOTE — Nursing Note (Signed)
02/16/20 1400   Physical Activity   On average, how many days per week do you engage in moderate to strenuous exercise (like a brisk walk)? 0 days   On average, how many minutes do you engage in exercise at this level? 0 min   Financial Resource Strain   How hard is it for you to pay for the very basics like food, housing, medical care, and heating? Not hard   [employer] Housing Screener   In the last 12 months, was there a time when you were not able to pay the mortgage or rent on time? N   In the last 12 months, was there a time when you did not have a steady place to sleep or slept in a shelter (including now)? N   Transportation Needs   In the past 12 months, has lack of transportation kept you from medical appointments or from getting medications? no   In the past 12 months, has lack of transportation kept you from meetings, work, or from getting things needed for daily living? No   Food Insecurity   Within the past 12 months, you worried that your food would run out before you got the money to buy more. Never true   Within the past 12 months, the food you bought just didn't last and you didn't have money to get more. Never true   Stress   Do you feel stress - tense, restless, nervous, or anxious, or unable to sleep at night because your mind is troubled all the time - these days? Only a littl   Intimate Partner Violence   Within the last year, have you been afraid of your partner or ex-partner? No   Within the last year, have you been humiliated or emotionally abused in other ways by your partner or ex-partner? No   Within the last year, have you been kicked, hit, slapped, or otherwise physically hurt by your partner or ex-partner? No   Within the last year, have you been raped or forced to have any kind of sexual activity by your partner or ex-partner? No   Alcohol Use   How often do you have a drink containing alcohol? Never   How many drinks containing alcohol do you have on a typical day  when you are drinking? Patient refu   How often do you have six or more drinks on one occasion? Never

## 2020-02-16 NOTE — H&P (Signed)
[facility]  [address]  [place], [place] [address]  P: [phone]     Name: Joanna Black MRN:  [id number]   Date: 02/16/2020 Age: 74 y.o.      Chief Complaint: Sleep Problems and Headache    History of Present Illness              Patient is here with c/o of drowsiness and fatigue since eye surgery. Light sensitivity better. Has a feeling of heaviness, underwater, and wants to nod off to sleep and feels it coming on, starts with worsening headache. Never had much headaches, now has bilateral headache in temporal area    Doesn't go to bed until midnight doesn't get up until 10am and takes a nap middle of day. Slept ok a year ago slept ok    Chronic low back pain - intermittent; once had spinal injection at [facility] clinic. Ogoing for many years radiates to hip and knee on left side, lumbar x-ray last year demonstrated mild degenerative changes anterolisthesisat L4 through 5; she had had a lumbar MRI 2015 and saw Dr. 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 Lower back quit hurting by the time she made it there, so no further w/u. Often resolves on own.    Esophageal stricture - esophageal dilatation July 2020 at [facility], her upper GI done at the same time was normalother than a small amount gastritis    Diabetes-on metformin;had hard time taking statinother then pravacholcurrently on none, she is aware of diabetic guidelines that recommend statin use, she said she would think about it; on arm  Lab A1C Results:  HEMOGLOBIN A1C   Date Value Ref Range Status   01/19/2020 6.6 (H) 4.8 - 6.2 % Final   09/06/2019 7.0 (H) 4.8 - 6.2 % Final   01/29/2019 7.3 (H) 4.8 - 6.2 % Final     Ref Range & Units 07/2019   MICROALBUMIN URINE   0.0 - 2.0 mg/dL <5.6      CHF-by patient report shehas acute exacerbations, BNP can go up to 600; EF when asymptomatic 65%; takes spironolactoneBNP in February 2020 was 130, this was  baseline    Hypertension-has home cuff, runs normal; controlled Cath 02/2016 mild disease; does have BP cuff at home    Hypertriglyceridemia-hx statin intolerance        Lab Results   Component Value Date    CHOLESTEROL 209 (H) 08/04/2019    HDLCHOL 38 (L) 08/04/2019    LDLCHOL 129 (H) 08/04/2019    TRIG 211 (H) 08/04/2019       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     She had cardiac workup in [place] at [facility] while she was visiting her son08/2019,see scanned media report,Lexiscan nuclear stress was negative for ischemia and showed ejection fraction of 62% with no motion abnormalities, see scanned media . 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 Arthritis    . Back problem    . BMI 35.0-35.9,adult 02/16/2020   . CAD (coronary artery disease)     mild   . Cataracts, bilateral    . CHF (congestive heart failure) (CMS HCC)    . Chronic bronchitis with emphysema (CMS HCC)    . Chronic low back pain    . Claustrophobia    . Depression    . Diabetes (CMS HCC)    . Esophageal reflux    . Essential hypertension 01/28/2019   . Hypercholesterolemia    . Hypertension    . Hypertriglyceridemia    . Type 2 diabetes mellitus (CMS  HCC)         Reference Surgical History Within present EMR     Current Outpatient Medications   Medication Sig   . acetaminophen (TYLENOL) 325 mg Oral Tablet Take 325 mg by mouth Every night Takes 2 at night   . albuterol sulfate (PROVENTIL OR VENTOLIN OR PROAIR) 90 mcg/actuation Inhalation HFA Aerosol Inhaler Take 1-2 Puffs by inhalation Every 6 hours as needed   . Blood Sugar Diagnostic (BLOOD GLUCOSE TEST) Strip Use to test blood glucose once daily.  Dx E11.9 (Patient not taking: Reported on 01/19/2020)   . estradioL (ESTRACE) 0.01 % (0.1 mg/gram) Vaginal Cream Use small amount as directed m - w - f   . FLUoxetine (PROZAC) 20 mg Oral Capsule TAKE 1 CAPSULE BY MOUTH EVERY DAY   . irbesartan (AVAPRO) 75 mg Oral Tablet Take 1 Tablet (75 mg total) by mouth Once a day Stop losartsn-hydrochlorothiazide   . metformin (GLUCOPHAGE-XR) 750 mg Oral Tablet Sustained Release 24 hr Take 1 Tab (750 mg total) by mouth Once a day   . metoprolol succinate (TOPROL-XL) 25 mg Oral Tablet Sustained Release 24 hr Take 1 Tab (25 mg total) by mouth Every night   . multivitamin Oral Tablet Take 1 Tab by mouth   . omeprazole (PRILOSEC) 40 mg Oral Capsule, Delayed Release(E.C.) Take 40 mg by mouth Once a day   . phenazopyridine (PYRIDIUM) 200 mg Oral Tablet Take 1 Tab (200 mg total) by mouth Three times a day as needed (urinary pain/bladder spasms)   . psyllium husk (METAMUCIL ORAL) Take by mouth   . tiZANidine (ZANAFLEX) 4 mg Oral Tablet TAKE 1 TABLET THREE TIMES A DAY AS NEEDED FOR MUSCLE CRAMPS     Allergies   Allergen Reactions   . Amoxicillin    . Augmentin [Amoxicillin-Pot Clavulanate] Nausea/ Vomiting   . Statins-Hmg-Coa Reductase Inhibitors Myalgia       Family Medical History:     Problem Relation (Age of Onset)    Bone cancer Father    Diabetes Multiple family members    Hypertension (High Blood Pressure) Multiple family members    No Known Problems Mother    Stomach Cancer Paternal Grandmother            Social History     Tobacco  Use   . Smoking status: Never Smoker   . Smokeless tobacco: Never Used   Vaping Use   . Vaping Use: Never used   Substance Use Topics   . Alcohol  use: No     Alcohol/week: 0.0 standard drinks   . Drug use: No         Review of Systems:             Other than ROS in the HPI, all other systems were negative.  Examination:  BP 120/60 (Site: Left, Patient Position: Sitting, Cuff Size: Adult)   Pulse 81   Temp 36.9 C (98.5 F) (Thermal Scan)   Resp 18   Ht 1.651 m (5\' 5" )   Wt 95.4 kg (210 lb 6.4 oz)   SpO2 98%   Breastfeeding No   BMI 35.01 kg/m       Nursing note reviewed; Vital signs reviewed  General: Alert and oriented to person place and time.   Eyes: Pupils equal and round, reactive to light and accomodation.   HENT:  Normocephalic  Lungs: non-labored   Cardiovascular: regular rate   MS: active ROM BUE and BLE  Skin: Skin warm and dry   Neurologic: gross and fine motor intact,  independent ambulation  Lymphatics: No lymphadenopathy  Psychiatric: Normal affect, behavior is appropriate    Data reviewed:                   COMPREHENSIVE METABOLIC PANEL  Lab Results   Component Value Date    SODIUM 141 01/19/2020    POTASSIUM 4.8 01/19/2020    CHLORIDE 102 01/19/2020    CO2 30 01/19/2020    ANIONGAP 9 01/19/2020    BUN 17 01/19/2020    CREATININE 1.31 (H) 01/19/2020    GLUCOSENF 121 01/19/2020    CALCIUM 9.5 01/19/2020    ALBUMIN 4.0 01/19/2020    TOTALPROTEIN 7.7 01/19/2020    ALKPHOS 97 01/19/2020    AST 16 01/19/2020    ALT 14 01/19/2020     CBC (Last 48 Hours):  No results for input(s): WBC, HGB, HCT, MCV, PLTCNT in the last 48 hours.    Assessment and Plan:                                    Johnice was seen today for sleep problems and headache.    Diagnoses and all orders for this visit:    Lethargy    Type 2 diabetes mellitus (CMS HCC)    CKD (chronic kidney disease) stage 3, 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/min    Essential hypertension  -     irbesartan (AVAPRO) 75 mg Oral Tablet; Take 1 Tablet (75 mg total) by mouth  Once a day Stop losartsn-hydrochlorothiazide    BMI 35.0-35.9,adult    Hot flashes  -     estradioL (ESTRACE) 0.01 % (0.1 mg/gram) Vaginal Cream; Use small amount as directed m - w - f      Patient was counseled of the benefits of weight loss      Follow Up:                                                             Return in about 4 months (around 06/15/2020).       Kelly Services, CFNP

## 2020-02-16 NOTE — Patient Instructions (Addendum)
[facility] PROGESTERONE SOYBEAN BASED CREME    ---------------------------------------------------------------------------  Take metoprolol at bedtime  Cut irbesartan in half and take 1/2 every morning    Please check BP and BS if sudden onset drowsiness    Look at multi vitamin and ensure it has iron; if not, then get children's chewable iron

## 2020-03-27 ENCOUNTER — Other Ambulatory Visit: Payer: Self-pay

## 2020-03-27 ENCOUNTER — Encounter (INDEPENDENT_AMBULATORY_CARE_PROVIDER_SITE_OTHER): Payer: Self-pay | Admitting: Physician Assistant

## 2020-03-27 ENCOUNTER — Ambulatory Visit: Payer: Medicare PPO | Attending: Physician Assistant | Admitting: Physician Assistant

## 2020-03-27 VITALS — BP 118/80 | HR 82 | Temp 96.6°F | Resp 18 | Wt 211.2 lb

## 2020-03-27 DIAGNOSIS — R42 Dizziness and giddiness: Secondary | ICD-10-CM | POA: Insufficient documentation

## 2020-03-27 DIAGNOSIS — H6502 Acute serous otitis media, left ear: Secondary | ICD-10-CM | POA: Insufficient documentation

## 2020-03-27 MED ORDER — MECLIZINE 25 MG TABLET
25.00 mg | ORAL_TABLET | Freq: Two times a day (BID) | ORAL | 0 refills | Status: DC | PRN
Start: 2020-03-27 — End: 2020-04-27

## 2020-03-27 NOTE — Patient Instructions (Signed)
Starting in November 2020, all results and notes are immediately available to our patients.     We believe in information transparency, and we believe you deserve to see your information as soon as it is available.    . Therefore, you may see some results even before we do. Please give us 2 business days to review and let you know our thoughts.      . There are many results like "MCHC" and "MCV" that may show abnormal but are not clinically important. There are other results, like "ANA" that are really challenging to interpret and require reviewing other results and other information from your chart. Sometimes these are best discussed in person.     . Biopsy Results, CT scan, MRI, and PET reports can show new or re-occurring cancer     . These reports can also contain words that are hard to understand     . These reports can also show unexpected results.      . We ALWAYS plan to review these results with you and decide on a plan together. We prefer to do this either in person, by video visit, or phone.      . When possible, we will discuss the possible results with you BEFORE getting the test, and the next steps we would take with each result.     . Some patients prefer to see their results online immediately. Because of possible "bad news," other patients may feel more comfortable waiting to discuss results when their provider is available at their next video visit or in-person visit or by phone. As the patient, you can choose when to view your result.     . Knowing all this, if you still have an immediate concern, you can send us a message or call our clinic to discuss. Otherwise we would prefer that we discuss this at your next appointment or as arranged.

## 2020-03-27 NOTE — Progress Notes (Signed)
FAMILY MED, [facility]  [address] [place] [address]    Progress Note    Name: Joanna Black MRN:  [id number]   Date: 03/27/2020 Age: 74 y.o.           Reason for Visit: Sore Throat (x 1 week, gland under neck has felt swollen)    History of Present Illness  Joanna Black is a 74 y.o. female who is being seen today for above.  Started as a sore t hroat last week and then had an enlarged lymph node on the left  And now her ear feels full like pressure and woke up feeling dizzy this morning.      Current Outpatient Medications   Medication Sig    acetaminophen (TYLENOL) 325 mg Oral Tablet Take 325 mg by mouth Every night Takes 2 at night    albuterol sulfate (PROVENTIL OR VENTOLIN OR PROAIR) 90 mcg/actuation Inhalation HFA Aerosol Inhaler Take 1-2 Puffs by inhalation Every 6 hours as needed    Blood Sugar Diagnostic (BLOOD GLUCOSE TEST) Strip Use to test blood glucose once daily.  Dx E11.9 (Patient not taking: Reported on 01/19/2020)    calcium carbonate/vitamin D3 (VITAMIN D-3 ORAL) Take by mouth    estradioL (ESTRACE) 0.01 % (0.1 mg/gram) Vaginal Cream Use small amount as directed m - w - f    FLUoxetine (PROZAC) 20 mg Oral Capsule TAKE 1 CAPSULE BY MOUTH EVERY DAY    irbesartan (AVAPRO) 75 mg Oral Tablet Take 1 Tablet (75 mg total) by mouth Once a day Stop losartsn-hydrochlorothiazide    meclizine (ANTIVERT) 25 mg Oral Tablet Take 1 Tablet (25 mg total) by mouth Every 12 hours as needed for Dizziness    metformin (GLUCOPHAGE-XR) 750 mg Oral Tablet Sustained Release 24 hr Take 1 Tab (750 mg total) by mouth Once a day    metoprolol succinate (TOPROL-XL) 25 mg Oral Tablet Sustained Release 24 hr Take 1 Tab (25 mg total) by mouth Every night    multivitamin Oral Tablet Take 1 Tab by mouth    omeprazole (PRILOSEC) 40 mg Oral Capsule, Delayed Release(E.C.) Take 40 mg by mouth Once a day    psyllium husk (METAMUCIL ORAL) Take by mouth    tiZANidine (ZANAFLEX) 4 mg Oral Tablet TAKE 1 TABLET  THREE TIMES A DAY AS NEEDED FOR MUSCLE CRAMPS     Allergies   Allergen Reactions    Amoxicillin     Augmentin [Amoxicillin-Pot Clavulanate] Nausea/ Vomiting    Statins-Hmg-Coa Reductase Inhibitors Myalgia       Physical Exam  BP 118/80 (Site: Right, Patient Position: Sitting, Cuff Size: Adult)    Pulse 82    Temp 35.9 C (96.6 F) (Temporal)    Resp 18    Wt 95.8 kg (211 lb 3.2 oz)    SpO2 96%    BMI 35.15 kg/m       Physical Exam  Vitals reviewed.   Constitutional:       General: She is not in acute distress.     Appearance: Normal appearance.   HENT:      Head: Normocephalic and atraumatic.      Right Ear: Tympanic membrane, ear canal and external ear normal. Decreased hearing noted.      Left Ear: Ear canal and external ear normal. Decreased hearing noted. A middle ear effusion is present.      Mouth/Throat:      Pharynx: Oropharynx is clear. No oropharyngeal exudate or posterior  oropharyngeal erythema.   Musculoskeletal:      Cervical back: Neck supple.   Lymphadenopathy:      Cervical: No cervical adenopathy.   Neurological:      Mental Status: She is alert and oriented to person, place, and time.   Psychiatric:         Mood and Affect: Mood normal.         Behavior: Behavior normal.         Thought Content: Thought content normal.         Judgment: Judgment normal.         Assessment and Plan  Problem List Items Addressed This Visit     None      Visit Diagnoses     Non-recurrent acute serous otitis media of left ear    -  Primary    OTC Flonase NS bid for a couple of weeks.     Episode of dizziness        RX:  Antivert 25 bid prn        F/U prn      Marin Roberts, PA-C

## 2020-04-14 ENCOUNTER — Other Ambulatory Visit (INDEPENDENT_AMBULATORY_CARE_PROVIDER_SITE_OTHER): Payer: Self-pay | Admitting: Family

## 2020-04-27 ENCOUNTER — Encounter (INDEPENDENT_AMBULATORY_CARE_PROVIDER_SITE_OTHER): Payer: Self-pay | Admitting: Physician Assistant

## 2020-04-27 ENCOUNTER — Ambulatory Visit: Payer: Medicare PPO | Attending: Physician Assistant | Admitting: Physician Assistant

## 2020-04-27 ENCOUNTER — Other Ambulatory Visit: Payer: Self-pay

## 2020-04-27 VITALS — BP 154/100 | HR 78 | Temp 97.1°F | Resp 18 | Ht 65.0 in | Wt 215.0 lb

## 2020-04-27 DIAGNOSIS — M545 Low back pain: Secondary | ICD-10-CM | POA: Insufficient documentation

## 2020-04-27 DIAGNOSIS — G8929 Other chronic pain: Secondary | ICD-10-CM | POA: Insufficient documentation

## 2020-04-27 DIAGNOSIS — I110 Hypertensive heart disease with heart failure: Secondary | ICD-10-CM | POA: Insufficient documentation

## 2020-04-27 DIAGNOSIS — F331 Major depressive disorder, recurrent, moderate: Secondary | ICD-10-CM | POA: Insufficient documentation

## 2020-04-27 DIAGNOSIS — F339 Major depressive disorder, recurrent, unspecified: Secondary | ICD-10-CM | POA: Insufficient documentation

## 2020-04-27 DIAGNOSIS — I10 Essential (primary) hypertension: Secondary | ICD-10-CM

## 2020-04-27 DIAGNOSIS — E1136 Type 2 diabetes mellitus with diabetic cataract: Secondary | ICD-10-CM | POA: Insufficient documentation

## 2020-04-27 DIAGNOSIS — E782 Mixed hyperlipidemia: Secondary | ICD-10-CM

## 2020-04-27 DIAGNOSIS — E119 Type 2 diabetes mellitus without complications: Secondary | ICD-10-CM

## 2020-04-27 DIAGNOSIS — K219 Gastro-esophageal reflux disease without esophagitis: Secondary | ICD-10-CM

## 2020-04-27 MED ORDER — EZETIMIBE 10 MG TABLET
10.00 mg | ORAL_TABLET | Freq: Every evening | ORAL | 1 refills | Status: DC
Start: 2020-04-27 — End: 2020-05-23

## 2020-04-27 NOTE — Assessment & Plan Note (Signed)
Continue Glucophage as RX'd

## 2020-04-27 NOTE — H&P (Signed)
FAMILY MED, [facility] BUILDING  [address] [place] [address]    History and Physical     Name: Joanna Black MRN:  [id number]   Date: 04/27/2020 Age: 74 y.o.         Reason for Visit: Establish Care (used to see Toma Copier)    History of Present Illness  Joanna Black is a 74 y.o. female who is being seen today to get established from her previous PCP who no longer works at this facility.  She denies complaints today.  Does not need refills today.  Last labs were done in January.  Has not been taking her Metoprolol because she was a little confused about how/when to take it. She says when she has to take something at night she often forgets to take it.      Past Medical History:   Diagnosis Date    Arthritis     Back problem     BMI 35.0-35.9,adult 02/16/2020    CAD (coronary artery disease)     mild    Cataracts, bilateral     CHF (congestive heart failure) (CMS HCC)     Chronic bronchitis with emphysema (CMS HCC)     Chronic low back pain     Claustrophobia     Depression     Diabetes (CMS HCC)     Esophageal reflux     Essential hypertension 01/28/2019    Hypercholesterolemia     Hypertension     Hypertriglyceridemia     Type 2 diabetes mellitus (CMS HCC)          Past Surgical History:   Procedure Laterality Date    CATARACT EXTRACTION, BILATERAL Bilateral     COLONOSCOPY      ESOPHAGOGASTRODUODENOSCOPY      HX CARPAL TUNNEL RELEASE      HX CATARACT REMOVAL Right 12/30/2019    HX CATARACT REMOVAL Left 01/06/2020    HX CHOLECYSTECTOMY      HX EXPOSURE TO METAL SHAVINGS      HX HEART CATHETERIZATION      HX HYSTERECTOMY      HX TAH AND BSO      HX TUBAL LIGATION      HX VEIN STRIPPING      Left leg         Current Outpatient Medications   Medication Sig    acetaminophen (TYLENOL) 325 mg Oral Tablet Take 325 mg by mouth Every night Takes 2 at night    albuterol sulfate (PROVENTIL OR VENTOLIN OR PROAIR) 90 mcg/actuation Inhalation HFA Aerosol Inhaler Take 1-2 Puffs by inhalation  Every 6 hours as needed    Blood Sugar Diagnostic (BLOOD GLUCOSE TEST) Strip Use to test blood glucose once daily.  Dx E11.9 (Patient not taking: Reported on 01/19/2020)    calcium carbonate/vitamin D3 (VITAMIN D-3 ORAL) Take by mouth    ezetimibe (ZETIA) 10 mg Oral Tablet Take 1 Tablet (10 mg total) by mouth Every evening    FLUoxetine (PROZAC) 20 mg Oral Capsule TAKE 1 CAPSULE BY MOUTH EVERY DAY    irbesartan (AVAPRO) 75 mg Oral Tablet Take 1 Tablet (75 mg total) by mouth Once a day Stop losartsn-hydrochlorothiazide    metformin (GLUCOPHAGE-XR) 750 mg Oral Tablet Sustained Release 24 hr TAKE 1 TABLET DAILY    metoprolol succinate (TOPROL-XL) 25 mg Oral Tablet Sustained Release 24 hr Take 1 Tab (25 mg total) by mouth Every night (Patient not taking: Reported on 04/27/2020)  multivitamin Oral Tablet Take 1 Tab by mouth    omeprazole (PRILOSEC) 40 mg Oral Capsule, Delayed Release(E.C.) Take 40 mg by mouth Once a day    psyllium husk (METAMUCIL ORAL) Take by mouth    tiZANidine (ZANAFLEX) 4 mg Oral Tablet TAKE 1 TABLET THREE TIMES A DAY AS NEEDED FOR MUSCLE CRAMPS     Allergies   Allergen Reactions    Amoxicillin     Augmentin [Amoxicillin-Pot Clavulanate] Nausea/ Vomiting    Statins-Hmg-Coa Reductase Inhibitors Myalgia     Family Medical History:     Problem Relation (Age of Onset)    Bone cancer Father    Diabetes Multiple family members    Hypertension (High Blood Pressure) Multiple family members    No Known Problems Mother    Stomach Cancer Paternal Grandmother            Social History     Tobacco Use    Smoking status: Never Smoker    Smokeless tobacco: Never Used   Vaping Use    Vaping Use: Never used   Substance Use Topics    Alcohol use: No     Alcohol/week: 0.0 standard drinks    Drug use: No       Review of Systems  Review of Systems   Constitutional: Positive for fatigue.   Eyes:        Just had cataract extraction.    Respiratory: Positive for wheezing (occadsionally uses an MDI but not  often.  NOn smoker).    Cardiovascular:        Had a cardiac workup a few years ago when she had an episode of SOB but was negative.    Gastrointestinal: Positive for constipation (takes metamucil every night).        GERD - takes a PPI with good relief.  Had an EGD a few years ago and had her esophagus stretched.    Genitourinary:        Frequent UTIs.  Drinks a lot of water and caffeine.    Musculoskeletal: Positive for back pain (takes occasional muscle relaxant with good relief.) and myalgias (upper back around shoulder blades. ).   Allergic/Immunologic: Negative.    Neurological: Negative.    Hematological: Bruises/bleeds easily (takes asa daily).       Physical Exam  BP (!) 154/100 (Site: Right, Patient Position: Sitting, Cuff Size: Adult)    Pulse 78    Temp 36.2 C (97.1 F) (Temporal)    Resp 18    Ht 1.651 m (5\' 5" )    Wt 97.5 kg (215 lb)    SpO2 94%    Breastfeeding No    BMI 35.78 kg/m       Physical Exam  Vitals reviewed.   Constitutional:       General: She is not in acute distress.     Appearance: Normal appearance. She is obese.   HENT:      Head: Normocephalic and atraumatic.   Cardiovascular:      Rate and Rhythm: Normal rate and regular rhythm.      Heart sounds: Normal heart sounds.   Pulmonary:      Effort: Pulmonary effort is normal.      Breath sounds: Normal breath sounds. No wheezing.   Abdominal:      General: Bowel sounds are normal. There is no distension.      Palpations: Abdomen is soft.      Tenderness: There is no abdominal tenderness.  Musculoskeletal:      Cervical back: Neck supple.      Right lower leg: No edema.      Left lower leg: No edema.   Skin:     General: Skin is warm and dry.   Neurological:      Mental Status: She is alert and oriented to person, place, and time.   Psychiatric:         Mood and Affect: Mood normal.         Behavior: Behavior normal.         Thought Content: Thought content normal.         Judgment: Judgment normal.         Assessment and Plan  Problem  List Items Addressed This Visit        Cardiovascular System    Essential hypertension - Primary     Continue Avapro as RX'd.  Stressed she start her Metoprolol.  If she has to take it in the am that's fine.           Relevant Orders    CBC    LIPID PANEL    COMPREHENSIVE METABOLIC PNL, FASTING    Mixed hyperlipidemia       Digestive    Chronic GERD     Continue Prilosec as RX'd - will discuss weaning off at a later date.             Endocrine    Diabetes (CMS HCC)     Continue Glucophage as RX'd.            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  I will see her in 4 months for a regular checkup or otherwise sooner prn.  Will recheck her blood pressure in 1 week.     Kathalene Frames, PA-C

## 2020-04-27 NOTE — Assessment & Plan Note (Signed)
Continue Avapro as RX'd.  Stressed she start her Metoprolol.  If she has to take it in the am that's fine.

## 2020-04-27 NOTE — Assessment & Plan Note (Signed)
Continue Prozac as RX'd

## 2020-04-27 NOTE — Patient Instructions (Addendum)
Starting in November 2020, all results and notes are immediately available to our patients.     We believe in information transparency, and we believe you deserve to see your information as soon as it is available.     Therefore, you may see some results even before we do. Please give [place] 2 business days to review and let you know our thoughts.       There are many results like MCHC and MCV that may show abnormal but are not clinically important. There are other results, like ANA that are really challenging to interpret and require reviewing other results and other information from your chart. Sometimes these are best discussed in person.      Biopsy Results, CT scan, MRI, and PET reports can show new or re-occurring cancer      These reports can also contain words that are hard to understand      These reports can also show unexpected results.       We ALWAYS plan to review these results with you and decide on a plan together. We prefer to do this either in person, by video visit, or phone.       When possible, we will discuss the possible results with you BEFORE getting the test, and the next steps we would take with each result.      Some patients prefer to see their results online immediately. Because of possible bad news, other patients may feel more comfortable waiting to discuss results when their provider is available at their next video visit or in-person visit or by phone. As the patient, you can choose when to view your result.      Knowing all this, if you still have an immediate concern, you can send [place] a message or call our clinic to discuss. Otherwise we would prefer that we discuss this at your next appointment or as arranged.    60 GRAMS OF PROTEIN A DAY  50 GRAMS OR LESS OR CARBS A DAY

## 2020-04-27 NOTE — Assessment & Plan Note (Signed)
Continue Zanaflex prn.

## 2020-04-27 NOTE — Assessment & Plan Note (Signed)
Continue Prilosec as RX'd - will discuss weaning off at a later date.

## 2020-05-01 ENCOUNTER — Other Ambulatory Visit: Payer: Self-pay

## 2020-05-01 ENCOUNTER — Ambulatory Visit: Payer: Medicare PPO | Attending: Physician Assistant

## 2020-05-01 VITALS — BP 158/92 | HR 78 | Wt 210.6 lb

## 2020-05-01 DIAGNOSIS — I10 Essential (primary) hypertension: Secondary | ICD-10-CM | POA: Insufficient documentation

## 2020-05-18 ENCOUNTER — Encounter (INDEPENDENT_AMBULATORY_CARE_PROVIDER_SITE_OTHER): Payer: Self-pay | Admitting: Family

## 2020-05-19 ENCOUNTER — Other Ambulatory Visit (INDEPENDENT_AMBULATORY_CARE_PROVIDER_SITE_OTHER): Payer: Self-pay | Admitting: Physician Assistant

## 2020-06-02 ENCOUNTER — Other Ambulatory Visit (INDEPENDENT_AMBULATORY_CARE_PROVIDER_SITE_OTHER): Payer: Self-pay | Admitting: Physician Assistant

## 2020-06-02 DIAGNOSIS — R252 Cramp and spasm: Secondary | ICD-10-CM

## 2020-06-05 MED ORDER — FLUOXETINE 20 MG CAPSULE
ORAL_CAPSULE | ORAL | 1 refills | Status: AC
Start: 2020-06-05 — End: ?

## 2020-06-05 MED ORDER — TIZANIDINE 4 MG TABLET
ORAL_TABLET | ORAL | 1 refills | Status: AC
Start: 2020-06-05 — End: ?

## 2020-06-08 ENCOUNTER — Other Ambulatory Visit: Payer: Self-pay

## 2020-06-08 ENCOUNTER — Ambulatory Visit: Payer: Medicare PPO | Attending: Physician Assistant | Admitting: Physician Assistant

## 2020-06-08 ENCOUNTER — Encounter (INDEPENDENT_AMBULATORY_CARE_PROVIDER_SITE_OTHER): Payer: Self-pay | Admitting: Physician Assistant

## 2020-06-08 VITALS — BP 148/84 | HR 66 | Temp 97.3°F | Resp 16 | Wt 208.8 lb

## 2020-06-08 DIAGNOSIS — M545 Low back pain: Secondary | ICD-10-CM | POA: Insufficient documentation

## 2020-06-08 DIAGNOSIS — M5441 Lumbago with sciatica, right side: Secondary | ICD-10-CM

## 2020-06-08 DIAGNOSIS — G8929 Other chronic pain: Secondary | ICD-10-CM | POA: Insufficient documentation

## 2020-06-08 DIAGNOSIS — M48061 Spinal stenosis, lumbar region without neurogenic claudication: Secondary | ICD-10-CM

## 2020-06-08 MED ORDER — DICLOFENAC SODIUM 50 MG TABLET,DELAYED RELEASE
50.00 mg | DELAYED_RELEASE_TABLET | Freq: Two times a day (BID) | ORAL | 1 refills | Status: DC
Start: 2020-06-08 — End: 2020-08-06

## 2020-06-08 NOTE — Assessment & Plan Note (Addendum)
RX:  Voltaren 50  Bid-tid with food prn pain  May continue Zanaflex prn

## 2020-06-08 NOTE — Patient Instructions (Addendum)
Starting in November 2020, all results and notes are immediately available to our patients.     We believe in information transparency, and we believe you deserve to see your information as soon as it is available.    . Therefore, you may see some results even before we do. Please give [place] 2 business days to review and let you know our thoughts.      . There are many results like "MCHC" and "MCV" that may show abnormal but are not clinically important. There are other results, like "ANA" that are really challenging to interpret and require reviewing other results and other information from your chart. Sometimes these are best discussed in person.     . Biopsy Results, CT scan, MRI, and PET reports can show new or re-occurring cancer     . These reports can also contain words that are hard to understand     . These reports can also show unexpected results.      . We ALWAYS plan to review these results with you and decide on a plan together. We prefer to do this either in person, by video visit, or phone.      . When possible, we will discuss the possible results with you BEFORE getting the test, and the next steps we would take with each result.     . Some patients prefer to see their results online immediately. Because of possible "bad news," other patients may feel more comfortable waiting to discuss results when their provider is available at their next video visit or in-person visit or by phone. As the patient, you can choose when to view your result.     . Knowing all this, if you still have an immediate concern, you can send [place] a message or call our clinic to discuss. Otherwise we would prefer that we discuss this at your next appointment or as arranged.      DO NOT TAKE DICLOFENAC WITH ANY OTC ANTI INFLAMMATORIES LIKE IBUPROFEN, ADVIL, MOTRIN OR ALEVE.

## 2020-06-08 NOTE — Nursing Note (Signed)
06/08/20 1500   Please answer the following questions on a scale of 0 to 10   Overall Pain Rating [id number] 4   Activity- during the past 24 hrs, pain has interfered with your usual activity 10   Sleep- during the past 24 hrs, pain has interfered with your sleep 2   Mood- during the past 24 hours, pain has affected your mood 5   Stress- during the past 24 hours, pain has constributed to your stress 5

## 2020-06-08 NOTE — Progress Notes (Signed)
FAMILY MED, [facility] BUILD[facility]  [address] [place] [address]    Progress Note    Name: Joanna Black MRN:  [id number]   Date: 06/08/2020 Age: 74 y.o.           Reason for Visit: Back Pain (x 4 days, NKI)    History of Present Illness  Joanna Black is a 74 y.o. female who is being seen today for above.  Says he saw Dr. Idolina Primer 6 years ago and nothing was done then because she wasn't in pain at that time but she is now.  Also c/o muscle spasms in her legs - bilateral.  Would like a referral back to see Dr. Idolina Primer again.  Does not want to see a chiropractor. C/o pain a couple of weeks ago for a day and this week for 2 days and it was "terrible"  And even had to have her husband help her get up and down and walk.  Notes she has had "back trouble" for 35 years and had a shot in her back at [facility] when she was working at the [employer]. Says it worked very good.  Says the pain is positional and shoots down both legs like a lightning bolt.  Denies bowel or bladder incontinence.  Denies numbness or tingling. Is taking Zanaflex at night and Tylenol but no NSAIDs    Current Outpatient Medications   Medication Sig   . acetaminophen (TYLENOL) 325 mg Oral Tablet Take 325 mg by mouth Every night Takes 2 at night   . albuterol sulfate (PROVENTIL OR VENTOLIN OR PROAIR) 90 mcg/actuation Inhalation HFA Aerosol Inhaler Take 1-2 Puffs by inhalation Every 6 hours as needed   . Blood Sugar Diagnostic (BLOOD GLUCOSE TEST) Strip Use to test blood glucose once daily.  Dx E11.9 (Patient not taking: Reported on 01/19/2020)   . calcium carbonate/vitamin D3 (VITAMIN D-3 ORAL) Take by mouth   . diclofenac sodium (VOLTAREN) 50 mg Oral Tablet, Delayed Release (E.C.) Take 1 Tablet (50 mg total) by mouth Twice daily   . ezetimibe (ZETIA) 10 mg Oral Tablet TAKE 1 TABLET BY MOUTH EVERY DAY IN THE EVENING   . FLUoxetine (PROZAC) 20 mg Oral Capsule TAKE 1 CAPSULE BY MOUTH EVERY DAY   . irbesartan (AVAPRO) 75 mg Oral  Tablet Take 1 Tablet (75 mg total) by mouth Once a day Stop losartsn-hydrochlorothiazide   . metformin (GLUCOPHAGE-XR) 750 mg Oral Tablet Sustained Release 24 hr TAKE 1 TABLET DAILY   . metoprolol succinate (TOPROL-XL) 25 mg Oral Tablet Sustained Release 24 hr Take 1 Tab (25 mg total) by mouth Every night   . multivitamin Oral Tablet Take 1 Tab by mouth (Patient not taking: Reported on 06/08/2020)   . omeprazole (PRILOSEC) 40 mg Oral Capsule, Delayed Release(E.C.) Take 40 mg by mouth Once a day   . psyllium husk (METAMUCIL ORAL) Take by mouth   . tiZANidine (ZANAFLEX) 4 mg Oral Tablet TAKE 1 TABLET THREE TIMES A DAY AS NEEDED FOR MUSCLE CRAMPS     Allergies   Allergen Reactions   . Amoxicillin    . Augmentin [Amoxicillin-Pot Clavulanate] Nausea/ Vomiting   . Statins-Hmg-Coa Reductase Inhibitors Myalgia       Physical Exam  BP (!) 148/84 (Site: Right, Patient Position: Sitting, Cuff Size: Adult)   Pulse 66   Temp 36.3 C (97.3 F) (Temporal)   Resp 16   Wt 94.7 kg (208 lb 12.8 oz)   SpO2 99%  Breastfeeding No   BMI 34.75 kg/m       Physical Exam  Vitals reviewed.   Constitutional:       General: She is in acute distress (seems uncomfortable with movement today).      Appearance: Normal appearance. She is obese.   HENT:      Head: Normocephalic and atraumatic.   Musculoskeletal:         General: Tenderness (generalized across low back) present.      Cervical back: Neck supple.   Skin:     General: Skin is warm and dry.   Neurological:      Mental Status: She is alert and oriented to person, place, and time.      Comments: SLR positive on right @ approx 35 degrees  SLR positive on left @ approx 50 degrees   Psychiatric:         Mood and Affect: Mood normal.         Behavior: Behavior normal.         Thought Content: Thought content normal.         Judgment: Judgment normal.         Assessment and Plan  Problem List Items Addressed This Visit        Musculoskeletal    Chronic bilateral low back pain     RX:   Voltaren 50  Bid-tid with food prn pain  May continue Zanaflex prn               Data Reviewed   Reviewed xrays done in 2019.  NO MRI of L-spine in Epic. I will make referral today.  I will see her back for her regular appointment or otherwise sooner prn.     Marin Roberts, PA-C

## 2020-06-08 NOTE — Nursing Note (Signed)
06/08/20 1542   PHQ 9 (follow up)   Little interest or pleasure in doing things. 1   Feeling down, depressed, or hopeless 0   PHQ 2 Total 1   Trouble falling or staying asleep, or sleeping too much. 1   Feeling tired or having little energy 0   Poor appetite or overeating 0   Feeling bad about yourself/ that you are a failure in the past 2 weeks? 0   Trouble concentrating on things in the past 2 weeks? 0   Moving/Speaking slowly or being fidgety or restless  in the past 2 weeks? 0   Thoughts that you would be better off DEAD, or of hurting yourself in some way. 0   If you checked off any problems, how difficult have these problems made it for you to do your work, take care of things at home, or get along with other people? Not difficult at all   PHQ 9 Total 2   Interpretation of Total Score No depression

## 2020-06-12 ENCOUNTER — Telehealth (INDEPENDENT_AMBULATORY_CARE_PROVIDER_SITE_OTHER): Payer: Self-pay | Admitting: Physician Assistant

## 2020-06-12 NOTE — Telephone Encounter (Signed)
Referral order placed to Dr. Idolina Primer, Neurosurgeon at [facility].  Rec'd message that he will not see pt without a recent (within last 6 months) MRI.  Or referral can be made to Physiatry, with Dr. Arrie Aran.

## 2020-06-13 ENCOUNTER — Other Ambulatory Visit (INDEPENDENT_AMBULATORY_CARE_PROVIDER_SITE_OTHER): Payer: Self-pay | Admitting: Physician Assistant

## 2020-06-13 DIAGNOSIS — M5416 Radiculopathy, lumbar region: Secondary | ICD-10-CM

## 2020-06-13 DIAGNOSIS — M4800 Spinal stenosis, site unspecified: Secondary | ICD-10-CM

## 2020-06-14 ENCOUNTER — Ambulatory Visit (INDEPENDENT_AMBULATORY_CARE_PROVIDER_SITE_OTHER): Payer: Medicare PPO

## 2020-06-14 ENCOUNTER — Encounter (INDEPENDENT_AMBULATORY_CARE_PROVIDER_SITE_OTHER): Payer: Self-pay

## 2020-08-03 ENCOUNTER — Other Ambulatory Visit (INDEPENDENT_AMBULATORY_CARE_PROVIDER_SITE_OTHER): Payer: Self-pay | Admitting: Physician Assistant

## 2020-08-14 ENCOUNTER — Other Ambulatory Visit (INDEPENDENT_AMBULATORY_CARE_PROVIDER_SITE_OTHER): Payer: Self-pay | Admitting: Physician Assistant

## 2020-08-14 DIAGNOSIS — R252 Cramp and spasm: Secondary | ICD-10-CM

## 2020-08-18 ENCOUNTER — Other Ambulatory Visit (INDEPENDENT_AMBULATORY_CARE_PROVIDER_SITE_OTHER): Payer: Self-pay | Admitting: Physician Assistant

## 2020-08-18 DIAGNOSIS — I10 Essential (primary) hypertension: Secondary | ICD-10-CM

## 2020-08-21 MED ORDER — EZETIMIBE 10 MG TABLET
10.00 mg | ORAL_TABLET | Freq: Every evening | ORAL | 1 refills | Status: DC
Start: 2020-08-21 — End: 2021-05-22

## 2020-08-21 MED ORDER — IRBESARTAN 75 MG TABLET
75.00 mg | ORAL_TABLET | Freq: Every day | ORAL | 1 refills | Status: DC
Start: 2020-08-21 — End: 2021-02-19

## 2020-08-30 ENCOUNTER — Encounter (INDEPENDENT_AMBULATORY_CARE_PROVIDER_SITE_OTHER): Payer: Self-pay | Admitting: Physician Assistant

## 2020-09-13 ENCOUNTER — Encounter (INDEPENDENT_AMBULATORY_CARE_PROVIDER_SITE_OTHER): Payer: Self-pay | Admitting: Physician Assistant

## 2020-09-23 ENCOUNTER — Other Ambulatory Visit (INDEPENDENT_AMBULATORY_CARE_PROVIDER_SITE_OTHER): Payer: Self-pay | Admitting: Family

## 2020-10-02 ENCOUNTER — Encounter (INDEPENDENT_AMBULATORY_CARE_PROVIDER_SITE_OTHER): Payer: Self-pay | Admitting: Physician Assistant

## 2020-10-30 ENCOUNTER — Other Ambulatory Visit (INDEPENDENT_AMBULATORY_CARE_PROVIDER_SITE_OTHER): Payer: Self-pay | Admitting: Physician Assistant

## 2020-10-30 DIAGNOSIS — E119 Type 2 diabetes mellitus without complications: Secondary | ICD-10-CM

## 2020-11-06 ENCOUNTER — Encounter (INDEPENDENT_AMBULATORY_CARE_PROVIDER_SITE_OTHER): Payer: Self-pay | Admitting: Physician Assistant

## 2020-11-13 ENCOUNTER — Encounter (INDEPENDENT_AMBULATORY_CARE_PROVIDER_SITE_OTHER): Payer: Self-pay | Admitting: Physician Assistant

## 2020-11-17 ENCOUNTER — Other Ambulatory Visit (INDEPENDENT_AMBULATORY_CARE_PROVIDER_SITE_OTHER): Payer: Self-pay | Admitting: Physician Assistant

## 2020-11-29 ENCOUNTER — Ambulatory Visit: Payer: Medicare PPO | Attending: Physician Assistant | Admitting: Physician Assistant

## 2020-11-29 ENCOUNTER — Other Ambulatory Visit: Payer: Self-pay

## 2020-11-29 ENCOUNTER — Encounter (INDEPENDENT_AMBULATORY_CARE_PROVIDER_SITE_OTHER): Payer: Self-pay | Admitting: Physician Assistant

## 2020-11-29 ENCOUNTER — Telehealth (INDEPENDENT_AMBULATORY_CARE_PROVIDER_SITE_OTHER): Payer: Self-pay | Admitting: Physician Assistant

## 2020-11-29 VITALS — BP 124/84 | HR 71 | Temp 97.5°F | Resp 16 | Ht 65.0 in | Wt 211.4 lb

## 2020-11-29 DIAGNOSIS — F339 Major depressive disorder, recurrent, unspecified: Secondary | ICD-10-CM | POA: Insufficient documentation

## 2020-11-29 DIAGNOSIS — Z13820 Encounter for screening for osteoporosis: Secondary | ICD-10-CM

## 2020-11-29 DIAGNOSIS — E119 Type 2 diabetes mellitus without complications: Secondary | ICD-10-CM

## 2020-11-29 DIAGNOSIS — Z0000 Encounter for general adult medical examination without abnormal findings: Secondary | ICD-10-CM

## 2020-11-29 DIAGNOSIS — Z79899 Other long term (current) drug therapy: Secondary | ICD-10-CM | POA: Insufficient documentation

## 2020-11-29 DIAGNOSIS — M1990 Unspecified osteoarthritis, unspecified site: Secondary | ICD-10-CM | POA: Insufficient documentation

## 2020-11-29 DIAGNOSIS — Z7984 Long term (current) use of oral hypoglycemic drugs: Secondary | ICD-10-CM | POA: Insufficient documentation

## 2020-11-29 DIAGNOSIS — M5441 Lumbago with sciatica, right side: Secondary | ICD-10-CM

## 2020-11-29 DIAGNOSIS — G8929 Other chronic pain: Secondary | ICD-10-CM | POA: Insufficient documentation

## 2020-11-29 DIAGNOSIS — M545 Low back pain, unspecified: Secondary | ICD-10-CM | POA: Insufficient documentation

## 2020-11-29 DIAGNOSIS — I2510 Atherosclerotic heart disease of native coronary artery without angina pectoris: Secondary | ICD-10-CM | POA: Insufficient documentation

## 2020-11-29 DIAGNOSIS — I509 Heart failure, unspecified: Secondary | ICD-10-CM | POA: Insufficient documentation

## 2020-11-29 DIAGNOSIS — Z7982 Long term (current) use of aspirin: Secondary | ICD-10-CM | POA: Insufficient documentation

## 2020-11-29 DIAGNOSIS — F331 Major depressive disorder, recurrent, moderate: Secondary | ICD-10-CM

## 2020-11-29 DIAGNOSIS — K219 Gastro-esophageal reflux disease without esophagitis: Secondary | ICD-10-CM | POA: Insufficient documentation

## 2020-11-29 DIAGNOSIS — E1136 Type 2 diabetes mellitus with diabetic cataract: Secondary | ICD-10-CM | POA: Insufficient documentation

## 2020-11-29 DIAGNOSIS — I10 Essential (primary) hypertension: Secondary | ICD-10-CM

## 2020-11-29 DIAGNOSIS — E782 Mixed hyperlipidemia: Secondary | ICD-10-CM | POA: Insufficient documentation

## 2020-11-29 DIAGNOSIS — I110 Hypertensive heart disease with heart failure: Secondary | ICD-10-CM | POA: Insufficient documentation

## 2020-11-29 DIAGNOSIS — J449 Chronic obstructive pulmonary disease, unspecified: Secondary | ICD-10-CM | POA: Insufficient documentation

## 2020-11-29 DIAGNOSIS — Z1231 Encounter for screening mammogram for malignant neoplasm of breast: Secondary | ICD-10-CM

## 2020-11-29 DIAGNOSIS — M5442 Lumbago with sciatica, left side: Secondary | ICD-10-CM

## 2020-11-29 LAB — COMPREHENSIVE METABOLIC PNL, FASTING
ALBUMIN: 4 g/dL (ref 3.4–4.8)
ALKALINE PHOSPHATASE: 100 U/L (ref 55–145)
ALT (SGPT): 90 U/L — ABNORMAL HIGH (ref 8–22)
ANION GAP: 9 mmol/L (ref 4–13)
AST (SGOT): 62 U/L — ABNORMAL HIGH (ref 8–45)
BILIRUBIN TOTAL: 0.4 mg/dL (ref 0.3–1.3)
BUN/CREA RATIO: 15 (ref 6–22)
BUN: 22 mg/dL (ref 8–25)
CALCIUM: 9.6 mg/dL (ref 8.8–10.2)
CHLORIDE: 103 mmol/L (ref 96–111)
CO2 TOTAL: 28 mmol/L (ref 23–31)
CREATININE: 1.42 mg/dL — ABNORMAL HIGH (ref 0.60–1.05)
ESTIMATED GFR: 36 mL/min/BSA — ABNORMAL LOW (ref >=60)
GLUCOSE: 113 mg/dL — ABNORMAL HIGH (ref 70–99)
POTASSIUM: 5 mmol/L (ref 3.5–5.1)
PROTEIN TOTAL: 7.6 g/dL (ref 6.0–8.0)
SODIUM: 140 mmol/L (ref 136–145)

## 2020-11-29 LAB — LIPID PANEL
CHOL/HDL RATIO: 4.5
CHOLESTEROL: 245 mg/dL — ABNORMAL HIGH (ref 100–200)
HDL CHOL: 54 mg/dL (ref >=50)
LDL CALC: 150 mg/dL — ABNORMAL HIGH (ref <100)
NON-HDL: 191 mg/dL — ABNORMAL HIGH (ref <=190)
TRIGLYCERIDES: 206 mg/dL — ABNORMAL HIGH (ref <150)
VLDL CALC: 41 mg/dL — ABNORMAL HIGH (ref <30)

## 2020-11-29 LAB — CBC
HCT: 38.6 % (ref 36.0–47.0)
HGB: 12.8 g/dL (ref 12.0–15.0)
MCH: 31.9 pg (ref 27.0–32.0)
MCHC: 33.1 g/dL (ref 32.0–36.0)
MCV: 96.5 fL (ref 80.0–100.0)
MPV: 10.4 fL — ABNORMAL HIGH (ref 6.6–9.3)
PLATELETS: 243 10*3/uL (ref 140–450)
RBC: 4 10*6/uL — ABNORMAL LOW (ref 4.20–5.40)
RDW: 14.8 % (ref 12.0–15.0)
WBC: 6.9 10*3/uL (ref 4.8–10.8)

## 2020-11-29 LAB — HGA1C (HEMOGLOBIN A1C WITH EST AVG GLUCOSE)
ESTIMATED AVERAGE GLUCOSE: 140 mg/dL
HEMOGLOBIN A1C: 6.5 % — ABNORMAL HIGH (ref 4.8–6.2)

## 2020-11-29 MED ORDER — BLOOD-GLUCOSE METER KIT
1.0000 | PACK | Freq: Every day | 0 refills | Status: DC
Start: 2020-11-29 — End: 2022-03-13

## 2020-11-29 MED ORDER — BLOOD SUGAR DIAGNOSTIC STRIPS
1.0000 | ORAL_STRIP | Freq: Every day | 1 refills | Status: DC
Start: 2020-11-29 — End: 2022-03-13

## 2020-11-29 MED ORDER — METOPROLOL SUCCINATE ER 25 MG TABLET,EXTENDED RELEASE 24 HR
25.0000 mg | ORAL_TABLET | Freq: Every evening | ORAL | 1 refills | Status: DC
Start: 2020-11-29 — End: 2020-11-29

## 2020-11-29 MED ORDER — OMEPRAZOLE 20 MG CAPSULE,DELAYED RELEASE
20.0000 mg | DELAYED_RELEASE_CAPSULE | Freq: Every day | ORAL | 0 refills | Status: DC
Start: 2020-11-29 — End: 2021-03-06

## 2020-11-29 MED ORDER — METOPROLOL SUCCINATE ER 25 MG TABLET,EXTENDED RELEASE 24 HR
25.0000 mg | ORAL_TABLET | Freq: Every evening | ORAL | 0 refills | Status: DC
Start: 2020-11-29 — End: 2021-02-21

## 2020-11-29 MED ORDER — LANCETS
1.0000 | Freq: Every day | 1 refills | Status: DC
Start: 2020-11-29 — End: 2022-03-13

## 2020-11-29 MED ORDER — FAMOTIDINE 40 MG TABLET
40.0000 mg | ORAL_TABLET | Freq: Two times a day (BID) | ORAL | 0 refills | Status: DC
Start: 2020-11-29 — End: 2021-01-09

## 2020-11-29 NOTE — Result Encounter Note (Signed)
Called patient, left message for return call  Adabelle Griffiths, LPN  11/29/2020, 15:10

## 2020-11-29 NOTE — Patient Instructions (Addendum)
Starting in November 2020, all results and notes are immediately available to our patients.     We believe in information transparency, and we believe you deserve to see your information as soon as it is available.     Therefore, you may see some results even before we do. Please give [place] 2 business days to review and let you know our thoughts.       There are many results like MCHC and MCV that may show abnormal but are not clinically important. There are other results, like ANA that are really challenging to interpret and require reviewing other results and other information from your chart. Sometimes these are best discussed in person.      Biopsy Results, CT scan, MRI, and PET reports can show new or re-occurring cancer      These reports can also contain words that are hard to understand      These reports can also show unexpected results.       We ALWAYS plan to review these results with you and decide on a plan together. We prefer to do this either in person, by video visit, or phone.       When possible, we will discuss the possible results with you BEFORE getting the test, and the next steps we would take with each result.      Some patients prefer to see their results online immediately. Because of possible bad news, other patients may feel more comfortable waiting to discuss results when their provider is available at their next video visit or in-person visit or by phone. As the patient, you can choose when to view your result.      Knowing all this, if you still have an immediate concern, you can send [place] a message or call our clinic to discuss. Otherwise we would prefer that we discuss this at your next appointment or as arranged.    TAKE 20 MG OF OMEPRAZOLE FOR 2 WEEKS EVERY DAY  THEN START ALTERNATING IT WITH PEPCID (FAMODITINE) 40 MG - ONE EVERY OTHER DAY FOR 2 WEEKS  THEN START TAKING PEPCID 40 MG EVERY DAY FOR 2 WEEKS  THEN TRY TO JUST TAKE PEPCID AS NEEDED ONLY.  Medicare Preventive  Services  Medicare coverage information Recommendation for YOU   Heart Disease and Diabetes   Lipid profile Every 5 years or more often if at risk for cardiovascular disease  Last Lipid Panel  (Last result in the past 2 years)      Cholesterol   HDL   LDL   Direct LDL   Triglycerides        11/29/20 0938 245   54   150  Comment: <100 mg/dL, Optimal  696-295 mg/dL, Near/Above Optimal  284-132 mg/dL, Borderline High  440-102 mg/dL, High  >=725 mg/dL, Very high     366            Diabetes Screening  yearly for those at risk for diabetes, 2 tests per year for those with prediabetes Last Glucose: 113    Diabetes Self Management Training or Medical Nutrition Therapy  For those with diabetes, up to 10 hrs initial training within a year, subsequent years up to 2 hrs of follow up training Optional for those with diabetes     Medical Nutrition Therapy Three hours of one-on-one counseling in first year, two hours in subsequent years Optional for those with diabetes, kidney disease   Intensive Behavioral Therapy for Obesity  Face-to-face counseling,  first month every week, month 2-6 every other week, month 7-12 every month if continued progress is documented Optional for those with Body Mass Index 30 or higher  Your Body mass index is 35.18 kg/m.   Tobacco Cessation (Quitting) Counseling   Two attempts per year, max 4 sessions per attempt, up to 8 per year, for those with tobacco-related health condition Optional for those that use tobacco   Cancer Screening   Colorectal screening   For anyone age 31 to 70 or any age if high risk:   Screening Colonoscopy every 10 yrs if low risk,  more frequent if higher risk  OR   Flexible  Sigmoidoscopy  every 5 yr OR   Fecal Occult Blood Testing yearly OR   Cologuard Stool DNA test once every 3 years OR   CT Colonography every 5 yrs    See your schedule below   Screening Pap Test Recommended every 3 years for all women age 97 to 32, or every five years if combined with HPV test  (routine screening not needed after total hysterectomy).  Medicare covers every 2 years, up to yearly if high risk.  Screening Pelvic Exam Medicare covers every 2 years, yearly if high risk or childbearing age with abnormal Pap in last 3 yrs. See your schedule below   Screening Mammogram   Recommended every 1-2 years for women age 83 to 2, and selectively recommended for women between 72-49 based on shared decisions about risk. Covered by Medicare up to every year for women age 27 or older See your schedule below   Lung Cancer Screening  Annual low dose computed tomography (LDCT scan) is recommended for those age 14-77 who smoked 30 pack-years and are current smokers or quit smoking within past 15 years (one pack-year= smoking one PPD for one year), after counseling by your doctor or nurse clinician about the possible benefits or harms See your schedule below   Vaccinations   Pneumococcal Vaccine Recommended routinely age 54+ with two separate vaccines one year apart (Prevnar then Pneumovax).  Recommended before age 28 if medical conditions increase risk  Seasonal Influenza Vaccine Once every flu season   Hepatitis B Vaccine 3 doses if risk (including anyone with diabetes or liver disease)  Shingles Vaccine Once or twice at age 17 or older  Diphtheria Tetanus Pertussis Vaccine ONCE as adult, booster every 10 years     Immunization History   Administered Date(s) Administered    Covid-19 Vaccine,Moderna 01/20/2020, 02/17/2020, 11/01/2020    High-Dose Influenza Vaccine, 65+ 11/25/2017, 10/26/2018, 09/25/2020    INFLUENZA VIRUS VACCINE (ADMIN) 10/20/2015    Influenza Vaccine, 6 month-adult 09/09/2008    Influenza Vaccine, 65+ 09/07/2019    PREVNAR 13 (ADMIN) 01/06/2013    Pneumovax 01/06/2014    ZOSTAVAX (VARICELLA ZOSTER VACCINE (ADMIN) 10/21/2013     Shingles vaccine and Diphtheria Tetanus Pertussis vaccines are available at pharmacies or local health department without a prescription.   Other Screening    Bone Densitometry   Every 24 months for anyone at risk, including postmenopausal       Glaucoma Screening   Yearly if in high risk group such as diabetes, family history, African American age 67+ or Hispanic American age 54+      Hepatitis C Screening recommended ONCE for those born between 1945-1965, or high risk for HCV infection       HIV Testing recommended routinely at least ONCE, covered every year for age 35 to 67 regardless of risk, and  every year for age over 40 who ask for the test or higher risk  Yearly or up to 3 times in pregnancy         Abdominal Aortic Aneurysm Screening Ultrasound   Once between the age of 33-75 with a family history of AAA       Your Personalized Schedule for Preventive Tests   Health Maintenance: Pending and Last Completed       Date Due Completion Date    Diabetic Retinal Exam Never done ---    Hepatitis C screening Never done ---    Adult Tdap-Td (1 - Tdap) Never done ---    Shingles Vaccine (2 of 3) 12/16/2013 10/21/2013    Annual Wellness Visit - Calendar Year Insurers 12/23/2020 11/29/2020    Diabetes A1C 05/30/2021 11/29/2020    Colonoscopy 04/03/2027 04/02/2017    Osteoporosis screening 12/05/2030 12/05/2020

## 2020-11-29 NOTE — Telephone Encounter (Signed)
Pt notified of appt at [facility] for Mammogram and Dexa Scan on Tuesday, 12/05/20, at 10:30 a.m.  She voiced understanding.

## 2020-11-29 NOTE — Nursing Note (Signed)
11/29/20 0803   PHQ 9 (follow up)   Little interest or pleasure in doing things. 0   Feeling down, depressed, or hopeless 0   PHQ 2 Total 0   Trouble falling or staying asleep, or sleeping too much. 0   Feeling tired or having little energy 1   Poor appetite or overeating 0   Feeling bad about yourself/ that you are a failure in the past 2 weeks? 0   Trouble concentrating on things in the past 2 weeks? 0   Moving/Speaking slowly or being fidgety or restless  in the past 2 weeks? 0   Thoughts that you would be better off DEAD, or of hurting yourself in some way. 0   If you checked off any problems, how difficult have these problems made it for you to do your work, take care of things at home, or get along with other people? Not difficult at all   PHQ 9 Total 1

## 2020-11-29 NOTE — Assessment & Plan Note (Signed)
Continue metformin as rx'd.  Continue checking sugars at home.

## 2020-11-29 NOTE — Assessment & Plan Note (Signed)
Discussed weaning down/off of PPI.

## 2020-11-29 NOTE — Assessment & Plan Note (Signed)
Continue voltaren and Zanaflex as rx'd

## 2020-11-29 NOTE — Assessment & Plan Note (Signed)
Continue Zetia as rx'd

## 2020-11-29 NOTE — Assessment & Plan Note (Signed)
Continue Prozac as rx'd

## 2020-11-29 NOTE — Progress Notes (Addendum)
Blood   FAMILY MED, [facility]  [address] [place] [address]    Progress Note    Name: Joanna Black MRN:  [id number]   Date: 11/29/2020 Age: 74 y.o.           Reason for Visit: No chief complaint on file.    History of Present Illness  Joanna Black is a 74 y.o. female who is being seen today for regular checkup for HPL, HTN, CHF, GERD, DM and Depression.  Her last labs were done back in January.  New lab orders were entered when I saw her in May but those have not been done yet. Her MGM was due in October.  She has had her covid vaccinations plus the booster. Had flu shot this year.  Says she is out of her Prilosec.  Has been out for about 3 weeks but has been taking some old Prevacid she had at home.  Fasting this morning for labs.     Current Outpatient Medications   Medication Sig   . acetaminophen (TYLENOL) 325 mg Oral Tablet Take 325 mg by mouth Every night Takes 2 at night   . albuterol sulfate (PROVENTIL OR VENTOLIN OR PROAIR) 90 mcg/actuation Inhalation HFA Aerosol Inhaler Take 1-2 Puffs by inhalation Every 6 hours as needed   . Blood Sugar Diagnostic (BLOOD GLUCOSE TEST) Strip Use to test blood glucose once daily.  Dx E11.9 (Patient not taking: Reported on 01/19/2020)   . calcium carbonate/vitamin D3 (VITAMIN D-3 ORAL) Take by mouth   . diclofenac sodium (VOLTAREN) 50 mg Oral Tablet, Delayed Release (E.C.) TAKE 1 TABLET BY MOUTH TWICE DAILY.   Marland Kitchen ezetimibe (ZETIA) 10 mg Oral Tablet Take 1 Tablet (10 mg total) by mouth Every evening   . FLUoxetine (PROZAC) 20 mg Oral Capsule TAKE 1 CAPSULE BY MOUTH EVERY DAY   . Irbesartan (AVAPRO) 75 mg Oral Tablet Take 1 Tablet (75 mg total) by mouth Once a day Stop losartsn-hydrochlorothiazide   . metformin (GLUCOPHAGE-XR) 750 mg Oral Tablet Sustained Release 24 hr TAKE 1 TABLET DAILY   . metoprolol succinate (TOPROL-XL) 25 mg Oral Tablet Sustained Release 24 hr Take 1 Tab (25 mg total) by mouth Every night   . multivitamin Oral Tablet Take 1 Tab by  mouth (Patient not taking: Reported on 06/08/2020)   . omeprazole (PRILOSEC) 40 mg Oral Capsule, Delayed Release(E.C.) Take 40 mg by mouth Once a day   . psyllium husk (METAMUCIL ORAL) Take by mouth   . tiZANidine (ZANAFLEX) 4 mg Oral Tablet TAKE 1 TABLET THREE TIMES A DAY AS NEEDED FOR MUSCLE CRAMPS     Allergies   Allergen Reactions   . Amoxicillin    . Augmentin [Amoxicillin-Pot Clavulanate] Nausea/ Vomiting   . Statins-Hmg-Coa Reductase Inhibitors Myalgia       Physical Exam  There were no vitals taken for this visit.      Physical Exam  Constitutional:       General: She is not in acute distress.     Appearance: Normal appearance. She is obese.   HENT:      Head: Normocephalic and atraumatic.   Cardiovascular:      Rate and Rhythm: Regular rhythm.      Heart sounds: Normal heart sounds.   Pulmonary:      Effort: Pulmonary effort is normal.      Breath sounds: Normal breath sounds. No wheezing or rales.   Abdominal:      General:  Abdomen is flat. Bowel sounds are normal. There is no distension.      Palpations: Abdomen is soft.      Tenderness: There is no abdominal tenderness.   Musculoskeletal:      Cervical back: Neck supple.   Skin:     General: Skin is warm and dry.   Neurological:      Mental Status: She is alert and oriented to person, place, and time.   Psychiatric:         Mood and Affect: Mood normal.         Behavior: Behavior normal.         Thought Content: Thought content normal.         Judgment: Judgment normal.       Results for orders placed or performed in visit on 11/29/20 (from the past 24 hour(s))   CBC   Result Value Ref Range    WBC 6.9 4.8 - 10.8 x10^3/uL    RBC 4.00 (L) 4.20 - 5.40 x10^6/uL    HGB 12.8 12.0 - 15.0 g/dL    HCT 97.3 53.2 - 99.2 %    MCV 96.5 80.0 - 100.0 fL    MCH 31.9 27.0 - 32.0 pg    MCHC 33.1 32.0 - 36.0 g/dL    RDW 42.6 83.4 - 19.6 %    PLATELETS 243 140 - 450 x10^3/uL    MPV 10.4 (H) 6.6 - 9.3 fL   LIPID PANEL   Result Value Ref Range    CHOLESTEROL  245 (H) 100 -  200 mg/dL    HDL CHOL 54 >=22 mg/dL    TRIGLYCERIDES 297 (H) <150 mg/dL    LDL CALC 989 (H) <211 mg/dL    VLDL CALC 41 (H) <94 mg/dL    NON-HDL 174 (H) <=081 mg/dL    CHOL/HDL RATIO 4.5    COMPREHENSIVE METABOLIC PNL, FASTING   Result Value Ref Range    SODIUM 140 136 - 145 mmol/L    POTASSIUM 5.0 3.5 - 5.1 mmol/L    CHLORIDE 103 96 - 111 mmol/L    CO2 TOTAL 28 23 - 31 mmol/L    ANION GAP 9 4 - 13 mmol/L    BUN 22 8 - 25 mg/dL    CREATININE 4.48 (H) 0.60 - 1.05 mg/dL    BUN/CREA RATIO 15 6 - 22    ESTIMATED GFR 36 (L) >=60 mL/min/BSA    ALBUMIN 4.0 3.4 - 4.8 g/dL     CALCIUM 9.6 8.8 - 18.5 mg/dL    GLUCOSE 631 (H) 70 - 99 mg/dL    ALKALINE PHOSPHATASE 100 55 - 145 U/L    ALT (SGPT) 90 (H) 8 - 22 U/L    AST (SGOT)  62 (H) 8 - 45 U/L    BILIRUBIN TOTAL 0.4 0.3 - 1.3 mg/dL    PROTEIN TOTAL 7.6 6.0 - 8.0 g/dL         Assessment and Plan  Problem List Items Addressed This Visit        Cardiovascular System    Essential hypertension - Primary     Continue metoprolol and avapro as rx'd         Mixed hyperlipidemia     Continue Zetia as rx'd            Digestive    Chronic GERD     Discussed weaning down/off of PPI.            Endocrine  Diabetes (CMS HCC)     Continue metformin as rx'd.  Continue checking sugars at home.             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               Data Reviewed   Labs ordered today, pending. I will see her in 6 months for a regular checkup or otherwise sooner prn. Refills sent per her request.  Ordered a new glucometer with strips and lancets.      Kathalene Frames, PA-C

## 2020-11-29 NOTE — Assessment & Plan Note (Signed)
Continue metoprolol and avapro as rx'd

## 2020-11-29 NOTE — Progress Notes (Addendum)
FAMILY MED, [place] [address]    Medicare Annual Wellness Visit    Name: Joanna Black MRN:  [id number]   Date: 11/29/2020 Age: 74 y.o.       SUBJECTIVE:   Joanna Black is a 74 y.o. female for presenting for Medicare Wellness exam.   I have reviewed and reconciled the medication list with the patient today.    Comprehensive Health Assessment:  Paper document [place] reviewed, signed and scanned into medical record    I have reviewed and updated as appropriate the past medical, family and social history. 11/29/2020 as summarized below:  Past Medical History:   Diagnosis Date    Arthritis     Back problem     BMI 35.0-35.9,adult 02/16/2020    CAD (coronary artery disease)     mild    Cataracts, bilateral     CHF (congestive heart failure) (CMS HCC)     Chronic bronchitis with emphysema (CMS HCC)     Chronic low back pain     Claustrophobia     Depression     Diabetes (CMS HCC)     Esophageal reflux     Essential hypertension 01/28/2019    Hypercholesterolemia     Hypertension     Hypertriglyceridemia     Type 2 diabetes mellitus (CMS HCC)      Past Surgical History:   Procedure Laterality Date    Cataract extraction, bilateral Bilateral     Colonoscopy      Esophagogastroduodenoscopy      Hx carpal tunnel release      Hx cataract removal Right 12/30/2019    Hx cataract removal Left 01/06/2020    Hx cholecystectomy      Hx exposure to metal shavings      Hx heart catheterization      Hx hysterectomy      Hx oophorectomy      Hx tah and bso      Hx tubal ligation      Hx vein stripping       Current Outpatient Medications   Medication Sig    acetaminophen (TYLENOL) 325 mg Oral Tablet Take 325 mg by mouth Every night Takes 2 at night    albuterol sulfate (PROVENTIL OR VENTOLIN OR PROAIR) 90 mcg/actuation Inhalation HFA Aerosol Inhaler Take 1-2 Puffs by inhalation Every 6 hours as needed    aspirin (ECOTRIN) 81 mg Oral Tablet,  Delayed Release (E.C.) Take 81 mg by mouth Once a day    Blood Sugar Diagnostic (BLOOD GLUCOSE TEST) Strip 1 Strip Once a day    Blood-Glucose Meter (BLOOD GLUCOSE MONITORING) Kit 1 Each Once a day    calcium carbonate/vitamin D3 (VITAMIN D-3 ORAL) Take by mouth    diclofenac sodium (VOLTAREN) 50 mg Oral Tablet, Delayed Release (E.C.) TAKE 1 TABLET BY MOUTH TWICE DAILY.    ezetimibe (ZETIA) 10 mg Oral Tablet Take 1 Tablet (10 mg total) by mouth Every evening    famotidine (PEPCID) 40 mg Oral Tablet Take 1 Tablet (40 mg total) by mouth Twice daily    FLUoxetine (PROZAC) 20 mg Oral Capsule TAKE 1 CAPSULE BY MOUTH EVERY DAY    ibandronate (BONIVA) 150 mg Oral Tablet Take 1 Tablet (150 mg total) by mouth Every 30 days (Patient not taking: Reported on 12/26/2020 )    Irbesartan (AVAPRO) 75 mg Oral Tablet Take 1 Tablet (75 mg total) by mouth Once a day  Stop losartsn-hydrochlorothiazide    Lancets Misc 1 Each Once a day    metoprolol succinate (TOPROL-XL) 25 mg Oral Tablet Sustained Release 24 hr Take 1 Tablet (25 mg total) by mouth Every night    multivitamin Oral Tablet Take 1 Tablet by mouth      omeprazole (PRILOSEC) 20 mg Oral Capsule, Delayed Release(E.C.) Take 1 Capsule (20 mg total) by mouth Once a day    psyllium husk (METAMUCIL ORAL) Take by mouth    tiZANidine (ZANAFLEX) 4 mg Oral Tablet TAKE 1 TABLET THREE TIMES A DAY AS NEEDED FOR MUSCLE CRAMPS    TRULICITY 7.62 GB/1.5 mL Subcutaneous Pen Injector INJECT 0.5 ML (0.75 MG) UNDER THE SKIN EVERY 7 DAYS     Family Medical History:     Problem Relation (Age of Onset)    Bone cancer Father    Diabetes Multiple family members    Hypertension (High Blood Pressure) Multiple family members    No Known Problems Mother    Stomach Cancer Paternal Grandmother          Social History     Socioeconomic History    Marital status: Married     Spouse name: [name]    Number of children: 3    Years of education: 12    Highest education level: High school  graduate   Occupational History    Occupation: Retired   Tobacco Use    Smoking status: Never Smoker    Smokeless tobacco: Never Used   Brewing technologist Use: Never used   Substance and Sexual Activity    Alcohol use: No     Alcohol/week: 0.0 standard drinks    Drug use: No    Sexual activity: Not Currently     Partners: Male   Other Topics Concern    Right hand dominant Yes    Ability to Walk 1 Flight of Steps without SOB/CP No    Ability To Do Own ADL's Yes   Social History Narrative    MARRIED.  Lives in single story home.  Heats home with electric and has well water.        April Haney, LPN  1/76/1607, 37:10     Social Determinants of Health     Financial Resource Strain: Low Risk     Difficulty of Paying Living Expenses: Not hard at all   Food Insecurity: No Food Insecurity    Worried About Charity fundraiser in the Last Year: Never true    Arboriculturist in the Last Year: Never true   Transportation Needs: No Transportation Needs    Lack of Transportation (Medical): No    Lack of Transportation (Non-Medical): No   Physical Activity: Inactive    Days of Exercise per Week: 0 days    Minutes of Exercise per Session: 0 min   Stress: No Stress Concern Present    Feeling of Stress : Only a little   Intimate Partner Violence: Not At Risk    Fear of Current or Ex-Partner: No    Emotionally Abused: No    Physically Abused: No    Sexually Abused: No              List of Current Health Care Providers   Care Team     PCP     Name Type Specialty Phone Number    Olegario Messier Physician Assistant [place] [phone]  Care Team     No care team found                  Health Maintenance   Topic Date Due    Diabetic Retinal Exam  Never done    Hepatitis C screening  Never done    Adult Tdap-Td (1 - Tdap) Never done    Shingles Vaccine (2 of 3) 12/16/2013    Annual Wellness Visit - Calendar Year Insurers  12/23/2020    Diabetes A1C  05/30/2021    Colonoscopy   04/03/2027    Osteoporosis screening  12/05/2030    Pneumococcal 65+ Years Low Risk  Completed    Influenza Vaccine  Completed    Covid-19 Vaccine  Completed     Medicare Wellness Assessment   Medicare initial or wellness physical in the last year?: No  Advance Directives (optional)   Does patient have a living will or MPOA: no   Has patient provided [employer] with a copy?: no   Advance directive information given to the patient today?: YES      Activities of Daily Living   Do you need help with dressing, bathing, or walking?: No   Do you need help with shopping, housekeeping, medications, or finances?: No   Do you have rugs in hallways, broken steps, or poor lighting?: No   Do you have grab bars in your bathroom, non-slip strips in your tub, and hand rails on your stairs?: No   Urinary Incontinence Screen (Women >=65 only)   Do you ever leak urine when you don't want to?: YES   Cognitive Function Screen (1=Yes, 0=No)   What is you age?: Correct   What is the time to the nearest hour?: Correct   What is the year?: Correct   What is the name of this clinic?: Correct   Can the patient recognize two persons (the doctor, the nurse, home help, etc.)?: Correct   What is the date of your birth? (day and month sufficient) : Correct   In what year did World War II end?: Incorrect   Who is the current president of the [place]?: Correct   Count from 20 down to 1?: Correct   What address did I give you earlier?: Incorrect   Total Score: 8       Hearing Screen   Have you noticed any hearing difficulties?: Yes  After whispering 9-1-6 how many numbers did the patient repeat correctly?:  (couldn't hear any numbers)   Fall Risk Screen   Do you feel unsteady when standing or walking?: Yes  Do you worry about falling?: Yes  Have you fallen in the past year?: No  Timed up and go test (in seconds): 17   Vision Screen   Right Eye = 20: 30   Left Eye = 20: 30   Depression Screen     Little interest or pleasure in doing  things.: Not at all  Feeling down, depressed, or hopeless: Not at all  PHQ 2 Total: 0  Trouble falling or staying asleep, or sleeping too much.: Not at all  Feeling tired or having little energy: Several Days  Poor appetite or overeating: Not at all  Feeling bad about yourself/ that you are a failure in the past 2 weeks?: Not at all  Trouble concentrating on things in the past 2 weeks?: Not at all  Moving/Speaking slowly or being fidgety or restless  in the past 2 weeks?: Not at all  Thoughts that you would be better off DEAD, or of hurting yourself in some way.: Not at all  PHQ 9 Total: 1        OBJECTIVE:   BP 124/84 (Site: Left, Patient Position: Sitting, Cuff Size: Adult)    Pulse 71    Temp 36.4 C (97.5 F) (Temporal)    Resp 16    Ht 1.651 m (_0 )    Wt 95.9 kg (211 lb 6.4 oz)    SpO2 94%    Breastfeeding No    BMI 35.18 kg/m        Other appropriate exam:    Health Maintenance Due   Topic Date Due    Diabetic Retinal Exam  Never done    Hepatitis C screening  Never done    Adult Tdap-Td (1 - Tdap) Never done    Shingles Vaccine (2 of 3) 12/16/2013    Annual Wellness Visit - Calendar Year Insurers  12/23/2020      ASSESSMENT & PLAN:  Problem List Items Addressed This Visit        Cardiovascular System    Essential hypertension     Continue metoprolol and avapro as rx'd         Relevant Medications    metoprolol succinate (TOPROL-XL) 25 mg Oral Tablet Sustained Release 24 hr    Mixed hyperlipidemia     Continue Zetia as rx'd            Digestive    Chronic GERD     Discussed weaning down/off of PPI.            Endocrine    Diabetes (CMS [place])     Continue metformin as rx'd.  Continue checking sugars at home.             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           Other Visit Diagnoses     Encounter for Medicare annual wellness exam     -  Primary    Encounter for screening mammogram for malignant neoplasm of breast        Relevant Orders    MAMMO BILATERAL SCREENING-ADDL VIEWS/BREAST US AS REQ BY RAD (Completed)    Screening for osteoporosis        Relevant Orders    DEXA BONE DENSITOMETRY (Completed)    Diabetes mellitus (CMS [place])               Identified Risk Factors/ Recommended Actions   BMI addressed: Advised on diet, weight loss, and exercise to reduce above normal BMI.  Started riding a stationary bike in the past week.  Plans to continue.   Fall Risk Follow up plan of care: Discussed optimizing home safety   One floor.  Animals in the house but not under feet. No throw rugs.  No shower rails.     Urinary Incontinence Plan of Care: Lifestyle modifications  Orders Placed This Encounter    MAMMO BILATERAL SCREENING-ADDL VIEWS/BREAST US AS REQ BY RAD    DEXA BONE DENSITOMETRY    omeprazole (PRILOSEC) 20 mg Oral Capsule, Delayed Release(E.C.)    famotidine (PEPCID) 40 mg Oral Tablet    Blood-Glucose Meter (BLOOD GLUCOSE MONITORING) Kit  Blood Sugar Diagnostic (BLOOD GLUCOSE TEST) Strip    Lancets Misc    metoprolol succinate (TOPROL-XL) 25 mg Oral Tablet Sustained Release 24 hr          The patient has been educated about risk factors and recommended preventive care. Written Prevention Plan completed/ updated and given to patient (see After Visit Summary).    I will see her in 1 year for a repeat check.  Sooner prn     Marin Roberts, PA-C  [place], [facility] BUILDING  [address]  [place]  [address]  [phone]  FAMILY MED, [place]  [place] [address]    Medicare Annual Wellness Visit    Name: Joanna Black MRN:  [id number]   Date: 11/29/2020 Age: 74 y.o.       SUBJECTIVE:   Joanna Black is a 74 y.o. female for presenting for Medicare Wellness exam.   I have reviewed and reconciled the medication list with the patient today.    Comprehensive Health  Assessment:  Paper document [place] reviewed, signed and scanned into medical record    I have reviewed and updated as appropriate the past medical, family and social history. 11/29/2020 as summarized below:  Past Medical History:   Diagnosis Date    Arthritis     Back problem     BMI 35.0-35.9,adult 02/16/2020    CAD (coronary artery disease)     mild    Cataracts, bilateral     CHF (congestive heart failure) (CMS HCC)     Chronic bronchitis with emphysema (CMS HCC)     Chronic low back pain     Claustrophobia     Depression     Diabetes (CMS HCC)     Esophageal reflux     Essential hypertension 01/28/2019    Hypercholesterolemia     Hypertension     Hypertriglyceridemia     Type 2 diabetes mellitus (CMS HCC)      Past Surgical History:   Procedure Laterality Date    Cataract extraction, bilateral Bilateral     Colonoscopy      Esophagogastroduodenoscopy      Hx carpal tunnel release      Hx cataract removal Right 12/30/2019    Hx cataract removal Left 01/06/2020    Hx cholecystectomy      Hx exposure to metal shavings      Hx heart catheterization      Hx hysterectomy      Hx oophorectomy      Hx tah and bso      Hx tubal ligation      Hx vein stripping       Current Outpatient Medications   Medication Sig    acetaminophen (TYLENOL) 325 mg Oral Tablet Take 325 mg by mouth Every night Takes 2 at night    albuterol sulfate (PROVENTIL OR VENTOLIN OR PROAIR) 90 mcg/actuation Inhalation HFA Aerosol Inhaler Take 1-2 Puffs by inhalation Every 6 hours as needed    aspirin (ECOTRIN) 81 mg Oral Tablet, Delayed Release (E.C.) Take 81 mg by mouth Once a day    Blood Sugar Diagnostic (BLOOD GLUCOSE TEST) Strip 1 Strip Once a day    Blood-Glucose Meter (BLOOD GLUCOSE MONITORING) Kit 1 Each Once a day    calcium carbonate/vitamin D3 (VITAMIN D-3 ORAL) Take by mouth    diclofenac sodium (VOLTAREN) 50 mg Oral Tablet, Delayed Release (E.C.) TAKE  1 TABLET BY MOUTH TWICE  DAILY.    ezetimibe (ZETIA) 10 mg Oral Tablet Take 1 Tablet (10 mg total) by mouth Every evening    famotidine (PEPCID) 40 mg Oral Tablet Take 1 Tablet (40 mg total) by mouth Twice daily    FLUoxetine (PROZAC) 20 mg Oral Capsule TAKE 1 CAPSULE BY MOUTH EVERY DAY    ibandronate (BONIVA) 150 mg Oral Tablet Take 1 Tablet (150 mg total) by mouth Every 30 days (Patient not taking: Reported on 12/26/2020 )    Irbesartan (AVAPRO) 75 mg Oral Tablet Take 1 Tablet (75 mg total) by mouth Once a day Stop losartsn-hydrochlorothiazide    Lancets Misc 1 Each Once a day    metoprolol succinate (TOPROL-XL) 25 mg Oral Tablet Sustained Release 24 hr Take 1 Tablet (25 mg total) by mouth Every night    multivitamin Oral Tablet Take 1 Tablet by mouth      omeprazole (PRILOSEC) 20 mg Oral Capsule, Delayed Release(E.C.) Take 1 Capsule (20 mg total) by mouth Once a day    psyllium husk (METAMUCIL ORAL) Take by mouth    tiZANidine (ZANAFLEX) 4 mg Oral Tablet TAKE 1 TABLET THREE TIMES A DAY AS NEEDED FOR MUSCLE CRAMPS    TRULICITY 3.08 MV/7.8 mL Subcutaneous Pen Injector INJECT 0.5 ML (0.75 MG) UNDER THE SKIN EVERY 7 DAYS     Family Medical History:     Problem Relation (Age of Onset)    Bone cancer Father    Diabetes Multiple family members    Hypertension (High Blood Pressure) Multiple family members    No Known Problems Mother    Stomach Cancer Paternal Grandmother          Social History     Socioeconomic History    Marital status: Married     Spouse name: [name]    Number of children: 3    Years of education: 12    Highest education level: High school graduate   Occupational History    Occupation: Retired   Tobacco Use    Smoking status: Never Smoker    Smokeless tobacco: Never Used   Brewing technologist Use: Never used   Substance and Sexual Activity    Alcohol use: No     Alcohol/week: 0.0 standard drinks    Drug use: No    Sexual activity: Not Currently     Partners: Male   Other Topics Concern    Right hand  dominant Yes    Ability to Walk 1 Flight of Steps without SOB/CP No    Ability To Do Own ADL's Yes   Social History Narrative    MARRIED.  Lives in single story home.  Heats home with electric and has well water.        April Haney, LPN  4/69/6295, 28:41     Social Determinants of Health     Financial Resource Strain: Low Risk     Difficulty of Paying Living Expenses: Not hard at all   Food Insecurity: No Food Insecurity    Worried About Charity fundraiser in the Last Year: Never true    Arboriculturist in the Last Year: Never true   Transportation Needs: No Transportation Needs    Lack of Transportation (Medical): No    Lack of Transportation (Non-Medical): No   Physical Activity: Inactive    Days of Exercise per Week: 0 days    Minutes of Exercise per Session: 0 min  Stress: No Stress Concern Present    Feeling of Stress : Only a little   Intimate Partner Violence: Not At Risk    Fear of Current or Ex-Partner: No    Emotionally Abused: No    Physically Abused: No    Sexually Abused: No       Review of Systems:  ROS     Physical exam:  Physical Exam      List of Current Health Care Providers   Care Team     PCP     Name Type Specialty Phone Number    Marin Roberts, [place] Physician [place] [phone]          Care Team     No care team found                  Health Maintenance   Topic Date Due    Diabetic Retinal Exam  Never done    Hepatitis C screening  Never done    Adult Tdap-Td (1 - Tdap) Never done    Shingles Vaccine (2 of 3) 12/16/2013    Annual Wellness Visit - Calendar Year Insurers  12/23/2020    Diabetes A1C  05/30/2021    Colonoscopy  04/03/2027    Osteoporosis screening  12/05/2030    Pneumococcal 65+ Years Low Risk  Completed    Influenza Vaccine  Completed    Covid-19 Vaccine  Completed     Medicare Wellness Assessment   Medicare initial or wellness physical in the last year?: No  Advance Directives (optional)   Does patient have a living will or  MPOA: no   Has patient provided [employer] with a copy?: no   Advance directive information given to the patient today?: YES      Activities of Daily Living   Do you need help with dressing, bathing, or walking?: No   Do you need help with shopping, housekeeping, medications, or finances?: No   Do you have rugs in hallways, broken steps, or poor lighting?: No   Do you have grab bars in your bathroom, non-slip strips in your tub, and hand rails on your stairs?: No   Urinary Incontinence Screen (Women >=65 only)   Do you ever leak urine when you don't want to?: YES   Cognitive Function Screen (1=Yes, 0=No)   What is you age?: Correct   What is the time to the nearest hour?: Correct   What is the year?: Correct   What is the name of this clinic?: Correct   Can the patient recognize two persons (the doctor, the nurse, home help, etc.)?: Correct   What is the date of your birth? (day and month sufficient) : Correct   In what year did World War II end?: Incorrect   Who is the current president of the [place]?: Correct   Count from 20 down to 1?: Correct   What address did I give you earlier?: Incorrect   Total Score: 8       Hearing Screen   Have you noticed any hearing difficulties?: Yes  After whispering 9-1-6 how many numbers did the patient repeat correctly?:  (couldn't hear any numbers)   Fall Risk Screen   Do you feel unsteady when standing or walking?: Yes  Do you worry about falling?: Yes  Have you fallen in the past year?: No  Timed up and go test (in seconds): 17   Vision Screen   Right Eye = 20:  71   Left Eye = 20: 30   Depression Screen     Little interest or pleasure in doing things.: Not at all  Feeling down, depressed, or hopeless: Not at all  PHQ 2 Total: 0  Trouble falling or staying asleep, or sleeping too much.: Not at all  Feeling tired or having little energy: Several Days  Poor appetite or overeating: Not at all  Feeling bad about yourself/ that you are a failure in the past 2 weeks?: Not  at all  Trouble concentrating on things in the past 2 weeks?: Not at all  Moving/Speaking slowly or being fidgety or restless  in the past 2 weeks?: Not at all  Thoughts that you would be better off DEAD, or of hurting yourself in some way.: Not at all  PHQ 9 Total: 1        OBJECTIVE:   BP 124/84 (Site: Left, Patient Position: Sitting, Cuff Size: Adult)    Pulse 71    Temp 36.4 C (97.5 F) (Temporal)    Resp 16    Ht 1.651 m (_0 )    Wt 95.9 kg (211 lb 6.4 oz)    SpO2 94%    Breastfeeding No    BMI 35.18 kg/m        Other appropriate exam:    Health Maintenance Due   Topic Date Due    Diabetic Retinal Exam  Never done    Hepatitis C screening  Never done    Adult Tdap-Td (1 - Tdap) Never done    Shingles Vaccine (2 of 3) 12/16/2013    Annual Wellness Visit - Calendar Year Insurers  12/23/2020      ASSESSMENT & PLAN:  Problem List Items Addressed This Visit        Cardiovascular System    Essential hypertension     Continue metoprolol and avapro as rx'd         Relevant Medications    metoprolol succinate (TOPROL-XL) 25 mg Oral Tablet Sustained Release 24 hr    Mixed hyperlipidemia     Continue Zetia as rx'd            Digestive    Chronic GERD     Discussed weaning down/off of PPI.            Endocrine    Diabetes (CMS [place])     Continue metformin as rx'd.  Continue checking sugars at home.             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           Other Visit Diagnoses     Encounter for Medicare annual wellness exam    -  Primary    Encounter for screening mammogram for malignant neoplasm of breast        Relevant Orders    MAMMO BILATERAL SCREENING-ADDL VIEWS/BREAST US AS REQ BY RAD (Completed)    Screening for osteoporosis        Relevant Orders    DEXA BONE DENSITOMETRY (Completed)    Diabetes mellitus (CMS [place])               Identified  Risk Factors/ Recommended Actions   BMI addressed: Advised on  diet, weight loss, and exercise to reduce above normal BMI.    Fall Risk Follow up plan of care: Discussed optimizing home safety    Urinary Incontinence Plan of Care: Patient was not screened for urinary incontinence because of  did not get addrssed .  Orders Placed This Encounter    MAMMO BILATERAL SCREENING-ADDL VIEWS/BREAST US AS REQ BY RAD    DEXA BONE DENSITOMETRY    omeprazole (PRILOSEC) 20 mg Oral Capsule, Delayed Release(E.C.)    famotidine (PEPCID) 40 mg Oral Tablet    Blood-Glucose Meter (BLOOD GLUCOSE MONITORING) Kit    Blood Sugar Diagnostic (BLOOD GLUCOSE TEST) Strip    Lancets Misc    metoprolol succinate (TOPROL-XL) 25 mg Oral Tablet Sustained Release 24 hr          The patient has been educated about risk factors and recommended preventive care. Written Prevention Plan completed/ updated and given to patient (see After Visit Summary).    Return in about 1 year (around 11/29/2021), or 6 month regular checkup.    Marin Roberts, PA-C  [place], [facility] BUILDING  [address]  [place] [address]  [phone]

## 2020-11-30 ENCOUNTER — Other Ambulatory Visit (INDEPENDENT_AMBULATORY_CARE_PROVIDER_SITE_OTHER): Payer: Self-pay | Admitting: Physician Assistant

## 2020-11-30 DIAGNOSIS — R7989 Other specified abnormal findings of blood chemistry: Secondary | ICD-10-CM

## 2020-11-30 MED ORDER — DULAGLUTIDE 0.75 MG/0.5 ML SUBCUTANEOUS PEN INJECTOR
0.7500 mg | PEN_INJECTOR | SUBCUTANEOUS | 0 refills | Status: DC
Start: 2020-11-30 — End: 2020-12-07

## 2020-11-30 NOTE — Result Encounter Note (Signed)
Called patient, notified of rx being sent in/instructions. She said her husband uses this and they know how to administer, but she will call with any questions  Verner Chol, LPN  11/23/4824, 16:45

## 2020-11-30 NOTE — Result Encounter Note (Signed)
Called patient, notified of lab results/instructions. Verbalized understanding. She is willing to try a shot for diabetes.  Verner Chol, LPN  43/01/7613, 14:45

## 2020-11-30 NOTE — Result Encounter Note (Signed)
Called patient, left message for return call  Verner Chol, LPN  70/06/6150, 12:26

## 2020-12-05 ENCOUNTER — Ambulatory Visit (HOSPITAL_COMMUNITY)
Admission: RE | Admit: 2020-12-05 | Discharge: 2020-12-05 | Disposition: A | Discharge status: Home / Self Care | Payer: Medicare PPO | Source: Ambulatory Visit | Attending: Physician Assistant | Admitting: Physician Assistant

## 2020-12-05 ENCOUNTER — Other Ambulatory Visit: Payer: Self-pay

## 2020-12-05 ENCOUNTER — Encounter (HOSPITAL_COMMUNITY): Payer: Self-pay

## 2020-12-05 ENCOUNTER — Ambulatory Visit
Admission: RE | Admit: 2020-12-05 | Discharge: 2020-12-05 | Disposition: A | Discharge status: Home / Self Care | Payer: Medicare PPO | Source: Ambulatory Visit | Attending: Physician Assistant | Admitting: Physician Assistant

## 2020-12-05 DIAGNOSIS — Z13820 Encounter for screening for osteoporosis: Secondary | ICD-10-CM | POA: Insufficient documentation

## 2020-12-05 DIAGNOSIS — Z1231 Encounter for screening mammogram for malignant neoplasm of breast: Secondary | ICD-10-CM | POA: Insufficient documentation

## 2020-12-05 DIAGNOSIS — Z780 Asymptomatic menopausal state: Secondary | ICD-10-CM | POA: Insufficient documentation

## 2020-12-05 DIAGNOSIS — M8580 Other specified disorders of bone density and structure, unspecified site: Secondary | ICD-10-CM | POA: Insufficient documentation

## 2020-12-06 ENCOUNTER — Other Ambulatory Visit (INDEPENDENT_AMBULATORY_CARE_PROVIDER_SITE_OTHER): Payer: Self-pay | Admitting: Physician Assistant

## 2020-12-06 MED ORDER — IBANDRONATE 150 MG TABLET
150.0000 mg | ORAL_TABLET | ORAL | 2 refills | Status: DC
Start: 2020-12-06 — End: 2021-06-20

## 2020-12-06 NOTE — Result Encounter Note (Signed)
Called patient, notified of Dexa results/instructions. Verbalized understanding. She is willing to try an oral medication to prevent osteoporosis  Verner Chol, LPN  58/30/9407, 08:41

## 2020-12-07 ENCOUNTER — Other Ambulatory Visit (INDEPENDENT_AMBULATORY_CARE_PROVIDER_SITE_OTHER): Payer: Self-pay | Admitting: Physician Assistant

## 2020-12-08 ENCOUNTER — Other Ambulatory Visit (INDEPENDENT_AMBULATORY_CARE_PROVIDER_SITE_OTHER): Payer: Self-pay | Admitting: Physician Assistant

## 2020-12-26 ENCOUNTER — Ambulatory Visit: Payer: Medicare PPO | Attending: Physician Assistant | Admitting: Physician Assistant

## 2020-12-26 ENCOUNTER — Encounter (INDEPENDENT_AMBULATORY_CARE_PROVIDER_SITE_OTHER): Payer: Self-pay | Admitting: Physician Assistant

## 2020-12-26 ENCOUNTER — Other Ambulatory Visit: Payer: Self-pay

## 2020-12-26 VITALS — BP 132/90 | HR 68 | Temp 97.3°F | Resp 18 | Wt 206.8 lb

## 2020-12-26 DIAGNOSIS — S46911A Strain of unspecified muscle, fascia and tendon at shoulder and upper arm level, right arm, initial encounter: Secondary | ICD-10-CM | POA: Insufficient documentation

## 2020-12-26 DIAGNOSIS — S46811A Strain of other muscles, fascia and tendons at shoulder and upper arm level, right arm, initial encounter: Secondary | ICD-10-CM

## 2020-12-26 NOTE — Patient Instructions (Signed)
Starting in November 2020, all results and notes are immediately available to our patients.     We believe in information transparency, and we believe you deserve to see your information as soon as it is available.    . Therefore, you may see some results even before we do. Please give us 2 business days to review and let you know our thoughts.      . There are many results like "MCHC" and "MCV" that may show abnormal but are not clinically important. There are other results, like "ANA" that are really challenging to interpret and require reviewing other results and other information from your chart. Sometimes these are best discussed in person.     . Biopsy Results, CT scan, MRI, and PET reports can show new or re-occurring cancer     . These reports can also contain words that are hard to understand     . These reports can also show unexpected results.      . We ALWAYS plan to review these results with you and decide on a plan together. We prefer to do this either in person, by video visit, or phone.      . When possible, we will discuss the possible results with you BEFORE getting the test, and the next steps we would take with each result.     . Some patients prefer to see their results online immediately. Because of possible "bad news," other patients may feel more comfortable waiting to discuss results when their provider is available at their next video visit or in-person visit or by phone. As the patient, you can choose when to view your result.     . Knowing all this, if you still have an immediate concern, you can send us a message or call our clinic to discuss. Otherwise we would prefer that we discuss this at your next appointment or as arranged.

## 2020-12-26 NOTE — Progress Notes (Signed)
FAMILY MED, [facility] BUILDING  [address]  [place] [address]    Progress Note    Name: Joanna Black MRN:  [id number]   Date: 12/26/2020 Age: 75 y.o.           Reason for Visit: Arm Pain (right arm pain x 1 month, NKI)    History of Present Illness  Joanna Black is a 75 y.o. female who is being seen today for above.  No injury.  Says she gets some knots up towards her neck.  Takes occasional tylenol sometimes with relief.  Tried heat.  Has not been taking her diclofenac or her Zanaflex.  On trulicity x 3-4 weeks and her blood sugar was 103 this morning and never over 111. Says she feels good and no issues giving it to herself.     Current Outpatient Medications   Medication Sig   . acetaminophen (TYLENOL) 325 mg Oral Tablet Take 325 mg by mouth Every night Takes 2 at night   . albuterol sulfate (PROVENTIL OR VENTOLIN OR PROAIR) 90 mcg/actuation Inhalation HFA Aerosol Inhaler Take 1-2 Puffs by inhalation Every 6 hours as needed   . aspirin (ECOTRIN) 81 mg Oral Tablet, Delayed Release (E.C.) Take 81 mg by mouth Once a day   . Blood Sugar Diagnostic (BLOOD GLUCOSE TEST) Strip 1 Strip Once a day   . Blood-Glucose Meter (BLOOD GLUCOSE MONITORING) Kit 1 Each Once a day   . calcium carbonate/vitamin D3 (VITAMIN D-3 ORAL) Take by mouth   . diclofenac sodium (VOLTAREN) 50 mg Oral Tablet, Delayed Release (E.C.) TAKE 1 TABLET BY MOUTH TWICE DAILY.   Marland Kitchen ezetimibe (ZETIA) 10 mg Oral Tablet Take 1 Tablet (10 mg total) by mouth Every evening   . famotidine (PEPCID) 40 mg Oral Tablet Take 1 Tablet (40 mg total) by mouth Twice daily   . FLUoxetine (PROZAC) 20 mg Oral Capsule TAKE 1 CAPSULE BY MOUTH EVERY DAY   . ibandronate (BONIVA) 150 mg Oral Tablet Take 1 Tablet (150 mg total) by mouth Every 30 days (Patient not taking: Reported on 12/26/2020 )   . Irbesartan (AVAPRO) 75 mg Oral Tablet Take 1 Tablet (75 mg total) by mouth Once a day Stop losartsn-hydrochlorothiazide   . Lancets Misc 1 Each Once a day   . metoprolol  succinate (TOPROL-XL) 25 mg Oral Tablet Sustained Release 24 hr Take 1 Tablet (25 mg total) by mouth Every night   . multivitamin Oral Tablet Take 1 Tablet by mouth     . omeprazole (PRILOSEC) 20 mg Oral Capsule, Delayed Release(E.C.) Take 1 Capsule (20 mg total) by mouth Once a day   . psyllium husk (METAMUCIL ORAL) Take by mouth   . tiZANidine (ZANAFLEX) 4 mg Oral Tablet TAKE 1 TABLET THREE TIMES A DAY AS NEEDED FOR MUSCLE CRAMPS   . TRULICITY 1.47 WG/9.5 mL Subcutaneous Pen Injector INJECT 0.5 ML (0.75 MG) UNDER THE SKIN EVERY 7 DAYS     Allergies   Allergen Reactions   . Amoxicillin    . Augmentin [Amoxicillin-Pot Clavulanate] Nausea/ Vomiting   . Statins-Hmg-Coa Reductase Inhibitors Myalgia       Physical Exam  BP (!) 132/90 (Site: Right, Patient Position: Sitting, Cuff Size: Adult)   Pulse 68   Temp 36.3 C (97.3 F) (Temporal)   Resp 18   Wt 93.8 kg (206 lb 12.8 oz)   SpO2 97%   Breastfeeding No   BMI 34.41 kg/m       Physical Exam  Vitals reviewed.   Constitutional:       General: She is not in acute distress.     Appearance: Normal appearance.   HENT:      Head: Normocephalic and atraumatic.   Musculoskeletal:         General: Tenderness (right distal trapezius and proximal humerus muscle.  No bony tenderness.  No JST.  FROM.) present. No swelling or deformity.      Cervical back: Neck supple.   Skin:     General: Skin is warm and dry.   Neurological:      Mental Status: She is alert and oriented to person, place, and time.   Psychiatric:         Mood and Affect: Mood normal.         Behavior: Behavior normal.         Thought Content: Thought content normal.         Judgment: Judgment normal.         Assessment and Plan  Problem List Items Addressed This Visit     None      Visit Diagnoses     Muscle strain of right upper arm, initial encounter    -  Primary    diclofenac as rx'd  Zanaflex as rx'd  heat    Trapezius muscle strain, right, initial encounter        diclofenac as rx'd  zanaflx as  rx'd  heat        F/U 7-10 days if no better or sooner prn. F/u otherwise as scheduled for regular checkup.       Marin Roberts, PA-C

## 2021-01-01 NOTE — Addendum Note (Signed)
Addended by: Clinton Quant on: 01/01/2021 07:03 AM     Modules accepted: Kipp Brood

## 2021-01-06 ENCOUNTER — Other Ambulatory Visit (INDEPENDENT_AMBULATORY_CARE_PROVIDER_SITE_OTHER): Payer: Self-pay | Admitting: Physician Assistant

## 2021-01-16 ENCOUNTER — Other Ambulatory Visit (INDEPENDENT_AMBULATORY_CARE_PROVIDER_SITE_OTHER): Payer: Self-pay | Admitting: Physician Assistant

## 2021-01-16 ENCOUNTER — Telehealth (INDEPENDENT_AMBULATORY_CARE_PROVIDER_SITE_OTHER): Payer: Self-pay | Admitting: Physician Assistant

## 2021-01-16 DIAGNOSIS — M25511 Pain in right shoulder: Secondary | ICD-10-CM

## 2021-01-16 NOTE — Telephone Encounter (Signed)
Patient notified of provider notes and would like xray  Verner Chol, LPN  0/73/7106, 10:39

## 2021-01-16 NOTE — Telephone Encounter (Signed)
Patient states she was seen a few weeks ago for right arm pain. Patient states her right shoulder is hurting and medicine isn't helping. She would like a MRI  Verner Chol, LPN  8/75/7972, 09:57

## 2021-01-17 ENCOUNTER — Other Ambulatory Visit: Payer: Self-pay

## 2021-01-17 ENCOUNTER — Ambulatory Visit
Admission: RE | Admit: 2021-01-17 | Discharge: 2021-01-17 | Disposition: A | Discharge status: Home / Self Care | Payer: Medicare PPO | Source: Ambulatory Visit | Attending: Physician Assistant | Admitting: Physician Assistant

## 2021-01-17 DIAGNOSIS — M25511 Pain in right shoulder: Secondary | ICD-10-CM

## 2021-01-17 NOTE — Result Encounter Note (Signed)
Called patient, left message for return call  Verner Chol, LPN  5/53/7482, 15:18

## 2021-01-18 NOTE — Result Encounter Note (Signed)
Called patient, left message for return call  Verner Chol, LPN  1/84/0375, 10:03

## 2021-01-22 NOTE — Result Encounter Note (Signed)
Called patient, left message for return call  Verner Chol, LPN  8/88/9169, 16:53

## 2021-01-24 NOTE — Result Encounter Note (Signed)
Called patient, left message for return call  Verner Chol, LPN  02/27/1695, 78:93

## 2021-01-26 ENCOUNTER — Other Ambulatory Visit (INDEPENDENT_AMBULATORY_CARE_PROVIDER_SITE_OTHER): Payer: Self-pay | Admitting: Physician Assistant

## 2021-01-29 ENCOUNTER — Other Ambulatory Visit (INDEPENDENT_AMBULATORY_CARE_PROVIDER_SITE_OTHER): Payer: Self-pay | Admitting: Physician Assistant

## 2021-01-29 DIAGNOSIS — M25511 Pain in right shoulder: Secondary | ICD-10-CM

## 2021-01-29 DIAGNOSIS — M25611 Stiffness of right shoulder, not elsewhere classified: Secondary | ICD-10-CM

## 2021-01-29 NOTE — Result Encounter Note (Signed)
Called patient, notified that we are going to try to get MRI approved. Verbalized understanding  Verner Chol, LPN  02/21/5497, 26:41

## 2021-01-29 NOTE — Result Encounter Note (Signed)
Called patient, notified of xray results. Verbalized understanding. Patient still having trouble lifting and raising arm. What is the next step?  Verner Chol, LPN  0/01/7740, 28:78

## 2021-02-06 ENCOUNTER — Encounter (INDEPENDENT_AMBULATORY_CARE_PROVIDER_SITE_OTHER): Payer: Self-pay | Admitting: Physician Assistant

## 2021-02-08 ENCOUNTER — Telehealth (INDEPENDENT_AMBULATORY_CARE_PROVIDER_SITE_OTHER): Payer: Self-pay | Admitting: Physician Assistant

## 2021-02-08 NOTE — Telephone Encounter (Signed)
Pt called to follow up on MRI.  I told it was denied by her insurance and asked her if she wanted referral to specialist or try some Physical Therapy.  She said it is feeling somewhat better now and will not do anything at this time.  I told her to call us back anytime if she changes her mind.  She voiced understanding.

## 2021-02-09 ENCOUNTER — Other Ambulatory Visit (INDEPENDENT_AMBULATORY_CARE_PROVIDER_SITE_OTHER): Payer: Self-pay | Admitting: Physician Assistant

## 2021-02-09 MED ORDER — FLUOXETINE 20 MG CAPSULE
20.0000 mg | ORAL_CAPSULE | Freq: Every day | ORAL | 1 refills | Status: DC
Start: 2021-02-09 — End: 2021-12-18

## 2021-02-13 ENCOUNTER — Other Ambulatory Visit (INDEPENDENT_AMBULATORY_CARE_PROVIDER_SITE_OTHER): Payer: Self-pay | Admitting: Optometrist

## 2021-02-13 ENCOUNTER — Encounter (INDEPENDENT_AMBULATORY_CARE_PROVIDER_SITE_OTHER): Payer: Self-pay | Admitting: Physician Assistant

## 2021-02-17 ENCOUNTER — Other Ambulatory Visit (INDEPENDENT_AMBULATORY_CARE_PROVIDER_SITE_OTHER): Payer: Self-pay | Admitting: Physician Assistant

## 2021-02-17 DIAGNOSIS — I10 Essential (primary) hypertension: Secondary | ICD-10-CM

## 2021-02-19 ENCOUNTER — Telehealth (INDEPENDENT_AMBULATORY_CARE_PROVIDER_SITE_OTHER): Payer: Self-pay | Admitting: Physician Assistant

## 2021-02-19 DIAGNOSIS — I10 Essential (primary) hypertension: Secondary | ICD-10-CM

## 2021-02-19 NOTE — Telephone Encounter (Signed)
Fax from [facility] requesting 90 days supply or metoprolol, but patient just wanted a short supply to last until express scripts came in  [place], [place]  2/33/6122, 17:19

## 2021-02-20 ENCOUNTER — Telehealth (INDEPENDENT_AMBULATORY_CARE_PROVIDER_SITE_OTHER): Payer: Self-pay | Admitting: Physician Assistant

## 2021-02-20 NOTE — Telephone Encounter (Signed)
She said her right shoulder bothered her a lot over the weekend and she would like referral to a specialist for that.  [place] is ok.

## 2021-02-21 ENCOUNTER — Other Ambulatory Visit (INDEPENDENT_AMBULATORY_CARE_PROVIDER_SITE_OTHER): Payer: Self-pay | Admitting: Physician Assistant

## 2021-02-21 DIAGNOSIS — I10 Essential (primary) hypertension: Secondary | ICD-10-CM

## 2021-03-06 ENCOUNTER — Other Ambulatory Visit (INDEPENDENT_AMBULATORY_CARE_PROVIDER_SITE_OTHER): Payer: Self-pay | Admitting: Physician Assistant

## 2021-04-13 ENCOUNTER — Other Ambulatory Visit (INDEPENDENT_AMBULATORY_CARE_PROVIDER_SITE_OTHER): Payer: Self-pay | Admitting: Physician Assistant

## 2021-05-14 ENCOUNTER — Other Ambulatory Visit (INDEPENDENT_AMBULATORY_CARE_PROVIDER_SITE_OTHER): Payer: Self-pay | Admitting: Physician Assistant

## 2021-05-14 ENCOUNTER — Ambulatory Visit: Payer: Medicare PPO | Attending: Physician Assistant | Admitting: Physician Assistant

## 2021-05-14 ENCOUNTER — Other Ambulatory Visit: Payer: Self-pay

## 2021-05-14 DIAGNOSIS — R350 Frequency of micturition: Secondary | ICD-10-CM | POA: Insufficient documentation

## 2021-05-14 DIAGNOSIS — U071 COVID-19: Secondary | ICD-10-CM | POA: Insufficient documentation

## 2021-05-14 DIAGNOSIS — R300 Dysuria: Secondary | ICD-10-CM | POA: Insufficient documentation

## 2021-05-14 DIAGNOSIS — J40 Bronchitis, not specified as acute or chronic: Secondary | ICD-10-CM | POA: Insufficient documentation

## 2021-05-14 MED ORDER — CIPROFLOXACIN 500 MG TABLET
500.0000 mg | ORAL_TABLET | Freq: Two times a day (BID) | ORAL | 0 refills | Status: DC
Start: 2021-05-14 — End: 2021-06-12

## 2021-05-14 MED ORDER — PHENAZOPYRIDINE 100 MG TABLET
100.0000 mg | ORAL_TABLET | Freq: Three times a day (TID) | ORAL | 0 refills | Status: DC | PRN
Start: 2021-05-14 — End: 2021-06-12

## 2021-05-14 NOTE — Progress Notes (Signed)
FAMILY MED, [facility] BUILDING  [address] [place] [address]    Telephone Visit    Name:  MARNELL MCDANIEL MRN: [id number]   Date:  05/14/2021 Age:   75 y.o.     The patient/family initiated a request for telephone service.  Verbal consent for this service was obtained from the patient/family.    Last office visit in this department: 12/26/2020      Reason for call:  UTI  Call notes:  Patient came to the office today to be seen for UTI however was also having a cough productive of green sputum.  She was therefore advised to go to her car and take a covid test which turned out to be positive.  She was therefore sent home to do a phone visit.  She does admit to DOE for a week or so associated with fatigue and sleeping a lot.  She c/o dysuria and frequency for 3 days.  Says she drinks water every day but also a lot of caffeine.  She also admits to diarrhea.        2 ICD-10-CM    1. Dysuria  R30.0     RX:  Pyridium 100 tid prn   2. Urinary frequency  R35.0    3. COVID-19  U07.1    4. Bronchitis due to COVID-19 virus  U07.1     J40     RX:  Cipro 500 bid x 10 days     I advised her if her sx were no better by the end of the week to let me know and if they were worse to go to the ER.    Total provider time spent with the patient on the phone: 5 minutes.    Kathalene Frames, PA-C

## 2021-05-19 ENCOUNTER — Other Ambulatory Visit (INDEPENDENT_AMBULATORY_CARE_PROVIDER_SITE_OTHER): Payer: Self-pay | Admitting: Physician Assistant

## 2021-05-30 ENCOUNTER — Encounter (INDEPENDENT_AMBULATORY_CARE_PROVIDER_SITE_OTHER): Payer: Self-pay | Admitting: Physician Assistant

## 2021-05-30 NOTE — Assessment & Plan Note (Deleted)
Continue Prozac as rx'd

## 2021-05-30 NOTE — Assessment & Plan Note (Deleted)
Continue Avapro and metoprolol as rx'd

## 2021-05-30 NOTE — Progress Notes (Deleted)
FAMILY MED, [place] [address]    Medicare Annual Wellness Visit    Name: Joanna Black MRN:  [id number]   Date: 05/30/2021 Age: 75 y.o.       SUBJECTIVE:   Joanna Black is a 75 y.o. female for presenting for Medicare Wellness exam.   I have reviewed and reconciled the medication list with the patient today.  She is also here for her regular checkup for HTN, HPL, GERD, DM2 and Depression.  She had her last labs in 12/21 at which time her A1C was 6.5 but down from 7.3.  Her GFR was down and LFTs were up.  We will repeat these today. Her Metformin was stopped at that time and she was started on Trulicity.  She is UTD on her MGM and DEXA.     Comprehensive Health Assessment:  Paper document [place] reviewed, signed and scanned into medical record    I have reviewed and updated as appropriate the past medical, family and social history. 05/30/2021 as summarized below:  Past Medical History:   Diagnosis Date   . Arthritis    . Back problem    . BMI 35.0-35.9,adult 02/16/2020   . CAD (coronary artery disease)     mild   . Cataracts, bilateral    . CHF (congestive heart failure) (CMS HCC)    . Chronic bronchitis with emphysema (CMS HCC)    . Chronic low back pain    . Claustrophobia    . Depression    . Diabetes (CMS [place])    . Esophageal reflux    . Essential hypertension 01/28/2019   . Hypercholesterolemia    . Hypertension    . Hypertriglyceridemia    . Type 2 diabetes mellitus (CMS HCC)      Past Surgical History:   Procedure Laterality Date   . Cataract extraction, bilateral Bilateral    . Colonoscopy     . Esophagogastroduodenoscopy     . Hx carpal tunnel release     . Hx cataract removal Right 12/30/2019   . Hx cataract removal Left 01/06/2020   . Hx cholecystectomy     . Hx exposure to metal shavings     . Hx heart catheterization     . Hx hysterectomy     . Hx oophorectomy     . Hx tah and bso     . Hx tubal ligation     . Hx vein stripping       Current  Outpatient Medications   Medication Sig   . acetaminophen (TYLENOL) 325 mg Oral Tablet Take 325 mg by mouth Every night Takes 2 at night   . albuterol sulfate (PROVENTIL OR VENTOLIN OR PROAIR) 90 mcg/actuation Inhalation HFA Aerosol Inhaler Take 1-2 Puffs by inhalation Every 6 hours as needed   . aspirin (ECOTRIN) 81 mg Oral Tablet, Delayed Release (E.C.) Take 81 mg by mouth Once a day   . Blood Sugar Diagnostic (BLOOD GLUCOSE TEST) Strip 1 Strip Once a day   . Blood-Glucose Meter (BLOOD GLUCOSE MONITORING) Kit 1 Each Once a day   . calcium carbonate/vitamin D3 (VITAMIN D-3 ORAL) Take by mouth   . diclofenac sodium (VOLTAREN) 50 mg Oral Tablet, Delayed Release (E.C.) TAKE 1 TABLET BY MOUTH TWICE A DAY   . ezetimibe (ZETIA) 10 mg Oral Tablet TAKE 1 TABLET BY MOUTH EVERY DAY IN THE EVENING   . famotidine (PEPCID) 40 mg Oral  Tablet TAKE 1 TABLET BY MOUTH EVERY DAY IN THE EVENING   . FLUoxetine (PROZAC) 20 mg Oral Capsule Take 1 Capsule (20 mg total) by mouth Once a day   . ibandronate (BONIVA) 150 mg Oral Tablet Take 1 Tablet (150 mg total) by mouth Every 30 days (Patient not taking: Reported on 12/26/2020 )   . Irbesartan (AVAPRO) 75 mg Oral Tablet TAKE 1 TABLET BY MOUTH EVERY DAY (STOP LOSARTAN/HCTZ)   . Lancets Misc 1 Each Once a day   . metoprolol succinate (TOPROL-XL) 25 mg Oral Tablet Sustained Release 24 hr TAKE 1 TABLET BY MOUTH EVERY NIGHT.   Marland Kitchen multivitamin Oral Tablet Take 1 Tablet by mouth     . omeprazole (PRILOSEC) 20 mg Oral Capsule, Delayed Release(E.C.) TAKE 1 CAPSULE BY MOUTH EVERY DAY   . phenazopyridine (PYRIDIUM) 100 mg Oral Tablet Take 1 Tablet (100 mg total) by mouth Three times a day as needed (urinary burning)   . psyllium husk (METAMUCIL ORAL) Take by mouth   . tiZANidine (ZANAFLEX) 4 mg Oral Tablet TAKE 1 TABLET THREE TIMES A DAY AS NEEDED FOR MUSCLE CRAMPS   . TRULICITY 5.36 RW/4.3 mL Subcutaneous Pen Injector INJECT 0.5 ML (0.75 MG) UNDER THE SKIN EVERY 7 DAYS     Family Medical History:      Problem Relation (Age of Onset)    Bone cancer Father    Diabetes Multiple family members    Hypertension (High Blood Pressure) Multiple family members    No Known Problems Mother    Stomach Cancer Paternal Grandmother          Social History     Socioeconomic History   . Marital status: Married     Spouse name: [name]   . Number of children: 3   . Years of education: 72   . Highest education level: High school graduate   Occupational History   . Occupation: Retired   Tobacco Use   . Smoking status: Never Smoker   . Smokeless tobacco: Never Used   Vaping Use   . Vaping Use: Never used   Substance and Sexual Activity   . Alcohol use: No     Alcohol/week: 0.0 standard drinks   . Drug use: No   . Sexual activity: Not Currently     Partners: Male   Other Topics Concern   . Right hand dominant Yes   . Ability to Walk 1 Flight of Steps without SOB/CP No   . Ability To Do Own ADL's Yes   Social History Narrative    MARRIED.  Lives in single story home.  Heats home with electric and has well water.        April Haney, LPN  1/54/0086, 76:19          Physical exam:  Physical Exam  Vitals reviewed.   Constitutional:       General: She is not in acute distress.     Appearance: Normal appearance. She is obese.      Comments: Husband with patient who also has an appointment.    HENT:      Head: Normocephalic and atraumatic.   Cardiovascular:      Rate and Rhythm: Normal rate and regular rhythm.      Heart sounds: Normal heart sounds.   Pulmonary:      Effort: Pulmonary effort is normal.      Breath sounds: Normal breath sounds. No wheezing or rales.   Abdominal:  General: Bowel sounds are normal. There is no distension.      Palpations: Abdomen is soft.      Tenderness: There is no abdominal tenderness.   Musculoskeletal:      Cervical back: Neck supple.   Skin:     General: Skin is warm and dry.   Neurological:      Mental Status: She is alert and oriented to person, place, and time.   Psychiatric:         Mood and Affect:  Mood normal.         Behavior: Behavior normal.         Thought Content: Thought content normal.         Judgment: Judgment normal.           List of Current Health Care Providers   Care Team     PCP     Name Type Specialty Phone Number    Olegario Messier Physician Assistant [place] [phone]          Care Team     No care team found                  Health Maintenance   Topic Date Due   . Hepatitis C screening  Never done   . Adult Tdap-Td (1 - Tdap) Never done   . Shingles Vaccine (2 of 3) 12/16/2013   . Annual Wellness Visit - Calendar Year Insurers  12/23/2020   . Diabetes A1C  05/30/2021   . Diabetic Retinal Exam  10/25/2021   . Colonoscopy  04/03/2027   . Osteoporosis screening  12/05/2030   . Influenza Vaccine  Completed   . Covid-19 Vaccine  Completed   . Pneumococcal Vaccination, Age 43+  Completed   . Meningococcal Vaccine  Aged Out     Medicare Wellness Assessment      Advance Directives (optional)                  Activities of Daily Living                   Urinary Incontinence Screen (Women >=65 only)       Cognitive Function Screen (1=Yes, 0=No)                                                   Hearing Screen       Fall Risk Screen       Vision Screen           Depression Screen              OBJECTIVE:   There were no vitals taken for this visit.       Other appropriate exam:    Health Maintenance Due   Topic Date Due   . Hepatitis C screening  Never done   . Adult Tdap-Td (1 - Tdap) Never done   . Shingles Vaccine (2 of 3) 12/16/2013   . Annual Wellness Visit - Calendar Year Insurers  12/23/2020   . Diabetes A1C  05/30/2021      ASSESSMENT & PLAN:  Problem List Items Addressed This Visit    None          Identified Risk Factors/ Recommended Actions   BMI addressed: Advised on diet, weight loss, and  exercise to reduce above normal BMI.          No orders of the defined types were placed in this encounter.         The patient has been educated about risk factors and recommended  preventive care. Written Prevention Plan completed/ updated and given to patient (see After Visit Summary).    No follow-ups on file.    Marin Roberts, PA-C  [facility], [facility] BUILDING  [address]  [place] [address]  [phone]

## 2021-05-30 NOTE — Assessment & Plan Note (Deleted)
Discussed PPI

## 2021-05-30 NOTE — Assessment & Plan Note (Deleted)
Continue trulicity as rx'd

## 2021-05-30 NOTE — Assessment & Plan Note (Deleted)
Continue Zetia as rx'd

## 2021-06-12 ENCOUNTER — Ambulatory Visit: Payer: Medicare PPO | Attending: Physician Assistant | Admitting: Physician Assistant

## 2021-06-12 ENCOUNTER — Encounter (INDEPENDENT_AMBULATORY_CARE_PROVIDER_SITE_OTHER): Payer: Self-pay | Admitting: Physician Assistant

## 2021-06-12 ENCOUNTER — Other Ambulatory Visit: Payer: Self-pay

## 2021-06-12 VITALS — BP 132/84 | HR 62 | Temp 96.9°F | Resp 18 | Wt 209.6 lb

## 2021-06-12 DIAGNOSIS — R300 Dysuria: Secondary | ICD-10-CM

## 2021-06-12 DIAGNOSIS — N3000 Acute cystitis without hematuria: Secondary | ICD-10-CM | POA: Insufficient documentation

## 2021-06-12 DIAGNOSIS — R350 Frequency of micturition: Secondary | ICD-10-CM

## 2021-06-12 MED ORDER — NITROFURANTOIN MACROCRYSTAL 50 MG CAPSULE
50.0000 mg | ORAL_CAPSULE | Freq: Every evening | ORAL | 0 refills | Status: DC
Start: 2021-06-12 — End: 2021-09-19

## 2021-06-12 MED ORDER — PHENAZOPYRIDINE 200 MG TABLET
200.0000 mg | ORAL_TABLET | Freq: Three times a day (TID) | ORAL | 0 refills | Status: DC | PRN
Start: 2021-06-12 — End: 2021-11-29

## 2021-06-12 MED ORDER — NITROFURANTOIN MONOHYDRATE/MACROCRYSTALS 100 MG CAPSULE
ORAL_CAPSULE | ORAL | 0 refills | Status: DC
Start: 2021-06-12 — End: 2021-06-20

## 2021-06-12 NOTE — Nursing Note (Signed)
06/12/21 1104   PHQ 9 (follow up)   Little interest or pleasure in doing things. 0   Feeling down, depressed, or hopeless 0   PHQ 2 Total 0   Trouble falling or staying asleep, or sleeping too much. 1   Feeling tired or having little energy 1   Poor appetite or overeating 0   Feeling bad about yourself/ that you are a failure in the past 2 weeks? 0   Trouble concentrating on things in the past 2 weeks? 0   Moving/Speaking slowly or being fidgety or restless  in the past 2 weeks? 0   Thoughts that you would be better off DEAD, or of hurting yourself in some way. 0   If you checked off any problems, how difficult have these problems made it for you to do your work, take care of things at home, or get along with other people? Not difficult at all   PHQ 9 Total 2   Interpretation of Total Score No depression

## 2021-06-12 NOTE — Nursing Note (Signed)
06/12/21 1100   Urine test  (Siemens Multistix 10 SG)   Time collected 1126   Color (Ref Range: Yellow) Yellow   Clarity (Ref Range: Clear) Clear   Glucose (Ref Range: Negative mg/dL) Negative   Bilirubin (Ref Range: Negative mg/dL) Negative   Ketones (Ref Range: Negative mg/dL) Negative   Urine Specific Gravity (Ref Range: 1.005 - 1.030) 1.015   Blood (urine) (Ref Range: Negative mg/dL) Negative   pH (Ref Range: 5.0 - 8.0) 6.0   Protein (Ref Range: Negative mg/dL) Negative   Urobilinogen (Ref Range: Negative mg/dL) 0.2mg /dL (Normal)   Nitrite (Ref Range: Negative) Negative   Leukocytes (Ref Range: Negative WBC's/uL) (!) 1+   Lot # [id number]   Expiration Date 05/22/22   Initials CD LPN

## 2021-06-12 NOTE — Progress Notes (Signed)
FAMILY MED, [facility] BUILDING  [address]  [place] [address]    Progress Note    Name: Joanna Black MRN:  [id number]   Date: 06/12/2021 Age: 75 y.o.           Reason for Visit: Pain on Urination (Burning with urination x 3 week)    History of Present Illness  Joanna Black is a 75 y.o. female who is being seen today for above.  Says the last RX she took for a UTI the end of May didn't work (Cipro 500).  Had some left over Macrobid that she took last night and one this morning and thinks it's doing better.  Admits to bladder spasms as well.  Denies fever or upper back pain. C/O chronic UTI's - would like either urology referral and/or tx.     Current Outpatient Medications   Medication Sig   . acetaminophen (TYLENOL) 325 mg Oral Tablet Take 325 mg by mouth Every night Takes 2 at night   . albuterol sulfate (PROVENTIL OR VENTOLIN OR PROAIR) 90 mcg/actuation Inhalation HFA Aerosol Inhaler Take 1-2 Puffs by inhalation Every 6 hours as needed   . aspirin (ECOTRIN) 81 mg Oral Tablet, Delayed Release (E.C.) Take 81 mg by mouth Once a day   . Blood Sugar Diagnostic (BLOOD GLUCOSE TEST) Strip 1 Strip Once a day   . Blood-Glucose Meter (BLOOD GLUCOSE MONITORING) Kit 1 Each Once a day   . calcium carbonate/vitamin D3 (VITAMIN D-3 ORAL) Take by mouth   . diclofenac sodium (VOLTAREN) 50 mg Oral Tablet, Delayed Release (E.C.) TAKE 1 TABLET BY MOUTH TWICE A DAY   . ezetimibe (ZETIA) 10 mg Oral Tablet TAKE 1 TABLET BY MOUTH EVERY DAY IN THE EVENING   . famotidine (PEPCID) 40 mg Oral Tablet TAKE 1 TABLET BY MOUTH EVERY DAY IN THE EVENING (Patient not taking: Reported on 06/12/2021)   . FLUoxetine (PROZAC) 20 mg Oral Capsule Take 1 Capsule (20 mg total) by mouth Once a day   . ibandronate (BONIVA) 150 mg Oral Tablet Take 1 Tablet (150 mg total) by mouth Every 30 days (Patient not taking: No sig reported)   . Irbesartan (AVAPRO) 75 mg Oral Tablet TAKE 1 TABLET BY MOUTH EVERY DAY (STOP LOSARTAN/HCTZ)   . Lancets Misc 1  Each Once a day   . metoprolol succinate (TOPROL-XL) 25 mg Oral Tablet Sustained Release 24 hr TAKE 1 TABLET BY MOUTH EVERY NIGHT.   Marland Kitchen multivitamin Oral Tablet Take 1 Tablet by mouth   (Patient not taking: Reported on 06/12/2021)   . nitrofurantoin (MACROBID) 100 mg Oral Capsule TAKE 1 CAPSULE BY MOUTH TWICE A DAY FOR 5 DAYS   . nitrofurantoin macrocrystaL (MACRODANTIN) 50 mg Oral Capsule Take 1 Capsule (50 mg total) by mouth Every night   . omeprazole (PRILOSEC) 20 mg Oral Capsule, Delayed Release(E.C.) TAKE 1 CAPSULE BY MOUTH EVERY DAY   . permethrin (ELIMITE) 5 % Cream PLEASE SEE ATTACHED FOR DETAILED DIRECTIONS   . phenazopyridine (PYRIDIUM) 100 mg Oral Tablet Take 1 Tablet (100 mg total) by mouth Three times a day as needed (urinary burning) (Patient not taking: Reported on 06/12/2021)   . phenazopyridine (PYRIDIUM) 200 mg Oral Tablet Take 1 Tablet (200 mg total) by mouth Three times a day as needed for Pain (urinary burning)   . psyllium husk (METAMUCIL ORAL) Take by mouth   . tiZANidine (ZANAFLEX) 4 mg Oral Tablet TAKE 1 TABLET THREE TIMES A DAY AS NEEDED FOR  MUSCLE CRAMPS   . TRULICITY 1.96 QI/2.9 mL Subcutaneous Pen Injector INJECT 0.5 ML (0.75 MG) UNDER THE SKIN EVERY 7 DAYS     Allergies   Allergen Reactions   . Amoxicillin    . Augmentin [Amoxicillin-Pot Clavulanate] Nausea/ Vomiting   . Statins-Hmg-Coa Reductase Inhibitors Myalgia       Physical Exam  BP 132/84 (Site: Left, Patient Position: Sitting, Cuff Size: Adult)   Pulse 62   Temp 36.1 C (96.9 F) (Temporal)   Resp 18   Wt 95.1 kg (209 lb 9.6 oz)   SpO2 96%   Breastfeeding No   BMI 34.88 kg/m       Physical Exam  Vitals reviewed.   Constitutional:       General: She is not in acute distress.     Appearance: Normal appearance.   HENT:      Head: Normocephalic and atraumatic.   Musculoskeletal:      Cervical back: Neck supple.   Neurological:      Mental Status: She is alert and oriented to person, place, and time.   Psychiatric:         Mood  and Affect: Mood normal.         Behavior: Behavior normal.         Thought Content: Thought content normal.         Judgment: Judgment normal.         Assessment and Plan  Problem List Items Addressed This Visit    None     Visit Diagnoses     Acute cystitis without hematuria    -  Primary    RX:  Macrobid 100 bid x 5 days and then Macrodantin 50 @ hs    Relevant Orders    Urine Culture    Dysuria        Relevant Orders    POCT Urine Dipstick    Urinary frequency        Relevant Orders    POCT Urine Dipstick        We also discussed using premarin vaginal cream and she thinks she has some from an old RX - she will check and use it if she does.  If this preventative tx does not work I will refer her to urology.  I will see her back as scheduled or otherwise prn.       Marin Roberts, PA-C

## 2021-06-14 LAB — URINE CULTURE: URINE CULTURE: NO GROWTH

## 2021-06-18 ENCOUNTER — Telehealth (INDEPENDENT_AMBULATORY_CARE_PROVIDER_SITE_OTHER): Payer: Self-pay | Admitting: Physician Assistant

## 2021-06-18 NOTE — Telephone Encounter (Signed)
Called patient, notified of provider notes. Verbalized understanding  Verner Chol, LPN  9/98/3382, 14:12

## 2021-06-18 NOTE — Telephone Encounter (Signed)
-----   Message from Kathalene Frames, [place] sent at 06/18/2021 10:29 AM EDT -----  Regarding: increased bleeding risk  Will you give Meredith a call and just let her know that the combination of Prozac and her NSAID can fairly significantly increase her chances of bleeding.  She hasn't had an issue with it yet but just needs to be aware and take the least amount of NSAIDs possible.

## 2021-06-20 ENCOUNTER — Other Ambulatory Visit: Payer: Self-pay

## 2021-06-20 ENCOUNTER — Ambulatory Visit: Payer: Medicare PPO | Attending: Physician Assistant | Admitting: Physician Assistant

## 2021-06-20 ENCOUNTER — Encounter (INDEPENDENT_AMBULATORY_CARE_PROVIDER_SITE_OTHER): Payer: Self-pay | Admitting: Physician Assistant

## 2021-06-20 VITALS — BP 128/74 | HR 75 | Temp 97.3°F | Resp 18 | Wt 209.4 lb

## 2021-06-20 DIAGNOSIS — L500 Allergic urticaria: Secondary | ICD-10-CM | POA: Insufficient documentation

## 2021-06-20 MED ORDER — METHYLPREDNISOLONE 4 MG TABLETS IN A DOSE PACK
ORAL_TABLET | ORAL | 0 refills | Status: DC
Start: 2021-06-20 — End: 2021-11-29

## 2021-06-20 MED ORDER — METHYLPREDNISOLONE SOD SUCC 125 MG SOLUTION FOR INJECTION WRAPPER
125.0000 mg | Freq: Once | INTRAVENOUS | Status: DC
Start: 2021-06-20 — End: 2021-06-20

## 2021-06-20 MED ORDER — METHYLPREDNISOLONE SOD SUCC (PF) 125 MG/2 ML SOLUTION FOR INJECTION
125.0000 mg | Freq: Once | INTRAMUSCULAR | Status: AC
Start: 2021-06-20 — End: 2021-06-20
  Administered 2021-06-20: 125 mg via INTRAMUSCULAR

## 2021-06-20 NOTE — Patient Instructions (Addendum)
Starting in November 2020, all results and notes are immediately available to our patients.     We believe in information transparency, and we believe you deserve to see your information as soon as it is available.    . Therefore, you may see some results even before we do. Please give [place] 2 business days to review and let you know our thoughts.      . There are many results like "MCHC" and "MCV" that may show abnormal but are not clinically important. There are other results, like "ANA" that are really challenging to interpret and require reviewing other results and other information from your chart. Sometimes these are best discussed in person.     . Biopsy Results, CT scan, MRI, and PET reports can show new or re-occurring cancer     . These reports can also contain words that are hard to understand     . These reports can also show unexpected results.      . We ALWAYS plan to review these results with you and decide on a plan together. We prefer to do this either in person, by video visit, or phone.      . When possible, we will discuss the possible results with you BEFORE getting the test, and the next steps we would take with each result.     . Some patients prefer to see their results online immediately. Because of possible "bad news," other patients may feel more comfortable waiting to discuss results when their provider is available at their next video visit or in-person visit or by phone. As the patient, you can choose when to view your result.     . Knowing all this, if you still have an immediate concern, you can send [place] a message or call our clinic to discuss. Otherwise we would prefer that we discuss this at your next appointment or as arranged.    Take Pepcid (famotidine) 40 mg 3 times a day for 3 days  May continue Benadryl as needed for itching.

## 2021-06-20 NOTE — Nursing Note (Signed)
06/20/21 1103   PHQ 9 (follow up)   Little interest or pleasure in doing things. 0   Feeling down, depressed, or hopeless 0   PHQ 2 Total 0   Trouble falling or staying asleep, or sleeping too much. 1   Feeling tired or having little energy 1   Poor appetite or overeating 0   Feeling bad about yourself/ that you are a failure in the past 2 weeks? 0   Trouble concentrating on things in the past 2 weeks? 0   Moving/Speaking slowly or being fidgety or restless  in the past 2 weeks? 0   Thoughts that you would be better off DEAD, or of hurting yourself in some way. 0   If you checked off any problems, how difficult have these problems made it for you to do your work, take care of things at home, or get along with other people? Not difficult at all   PHQ 9 Total 2   Interpretation of Total Score No depression

## 2021-06-20 NOTE — Progress Notes (Signed)
[facility]  [address]  [place] [id number]    Progress Note    Name: Joanna Black MRN:  [id number]   Date: 06/20/2021 Age: 75 y.o.           Reason for Visit: Hives (X 2 days)    History of Present Illness  Joanna Black is a 75 y.o. female who is being seen today for hives that started she thinks last night but could have been 2 days ago with a spot on her lower lip.  She denies any new foods, meds or detergents, lotions, dryer sheets or lotions.  Says something similar to this happened 40 years ago.  Denies shortness of breath or throat/tongue swelling.     Current Outpatient Medications   Medication Sig   . acetaminophen (TYLENOL) 325 mg Oral Tablet Take 325 mg by mouth Every night Takes 2 at night   . albuterol sulfate (PROVENTIL OR VENTOLIN OR PROAIR) 90 mcg/actuation Inhalation HFA Aerosol Inhaler Take 1-2 Puffs by inhalation Every 6 hours as needed   . aspirin (ECOTRIN) 81 mg Oral Tablet, Delayed Release (E.C.) Take 81 mg by mouth Once a day   . Blood Sugar Diagnostic (BLOOD GLUCOSE TEST) Strip 1 Strip Once a day   . Blood-Glucose Meter (BLOOD GLUCOSE MONITORING) Kit 1 Each Once a day   . calcium carbonate/vitamin D3 (VITAMIN D-3 ORAL) Take by mouth   . diclofenac sodium (VOLTAREN) 50 mg Oral Tablet, Delayed Release (E.C.) TAKE 1 TABLET BY MOUTH TWICE A DAY   . ezetimibe (ZETIA) 10 mg Oral Tablet TAKE 1 TABLET BY MOUTH EVERY DAY IN THE EVENING   . FLUoxetine (PROZAC) 20 mg Oral Capsule Take 1 Capsule (20 mg total) by mouth Once a day   . Irbesartan (AVAPRO) 75 mg Oral Tablet TAKE 1 TABLET BY MOUTH EVERY DAY (STOP LOSARTAN/HCTZ)   . Lancets Misc 1 Each Once a day   . Methylprednisolone (MEDROL DOSEPACK) 4 mg Oral Tablets, Dose Pack Take as instructed.   . metoprolol succinate (TOPROL-XL) 25 mg Oral Tablet Sustained Release 24 hr TAKE 1 TABLET BY MOUTH EVERY NIGHT.   Marland Kitchen nitrofurantoin (MACROBID) 100 mg Oral Capsule TAKE 1 CAPSULE BY MOUTH TWICE A DAY FOR 5 DAYS (Patient not  taking: Reported on 06/20/2021)   . nitrofurantoin macrocrystaL (MACRODANTIN) 50 mg Oral Capsule Take 1 Capsule (50 mg total) by mouth Every night   . omeprazole (PRILOSEC) 20 mg Oral Capsule, Delayed Release(E.C.) TAKE 1 CAPSULE BY MOUTH EVERY DAY   . permethrin (ELIMITE) 5 % Cream PLEASE SEE ATTACHED FOR DETAILED DIRECTIONS   . phenazopyridine (PYRIDIUM) 200 mg Oral Tablet Take 1 Tablet (200 mg total) by mouth Three times a day as needed for Pain (urinary burning)   . psyllium husk (METAMUCIL ORAL) Take by mouth   . tiZANidine (ZANAFLEX) 4 mg Oral Tablet TAKE 1 TABLET THREE TIMES A DAY AS NEEDED FOR MUSCLE CRAMPS   . TRULICITY 5.69 VX/4.8 mL Subcutaneous Pen Injector INJECT 0.5 ML (0.75 MG) UNDER THE SKIN EVERY 7 DAYS     Allergies   Allergen Reactions   . Amoxicillin    . Augmentin [Amoxicillin-Pot Clavulanate] Nausea/ Vomiting   . Statins-Hmg-Coa Reductase Inhibitors Myalgia       Physical Exam  BP 128/74 (Site: Right, Patient Position: Sitting, Cuff Size: Adult)   Pulse 75   Temp 36.3 C (97.3 F) (Temporal)   Resp 18   Wt 95 kg (209 lb 6.4 oz)  SpO2 95%   BMI 34.85 kg/m       Physical Exam  Vitals reviewed.   Constitutional:       General: She is not in acute distress.     Appearance: Normal appearance.   HENT:      Head: Normocephalic and atraumatic.      Mouth/Throat:      Pharynx: Oropharynx is clear. No pharyngeal swelling, posterior oropharyngeal erythema or uvula swelling.      Comments: Edematous lower lip  Pulmonary:      Effort: Pulmonary effort is normal. No respiratory distress.      Breath sounds: Normal breath sounds. No stridor. No wheezing.   Musculoskeletal:      Cervical back: Neck supple.   Skin:     General: Skin is warm and dry.      Findings: Rash (scattered urticarial lesions on neck, hands and forearms and upper abdomen. ) present.   Neurological:      Mental Status: She is alert and oriented to person, place, and time.   Psychiatric:         Mood and Affect: Mood normal.          Behavior: Behavior normal.         Thought Content: Thought content normal.         Judgment: Judgment normal.         Assessment and Plan  Problem List Items Addressed This Visit    None     Visit Diagnoses     Allergic urticaria    -  Primary    RX:  MDP prn  OTC Pepcid 40 mg tid x 3 days  OTC Benadryl prn    Relevant Medications    methylPREDNISolone sod succ (SOLU-MEDROL) 125 mg/2 mL injection (Start on 06/20/2021  1:00 PM)    Other Relevant Orders    Administer Injection (IM/SQ)        Advised patient to keep a diary of everything she eaten, drank, taken or used on her body for the past 48 hours. She will go to the ER with any worsening of sx.  I will see her back as scheduled or otherwise sooner prn.       Marin Roberts, PA-C

## 2021-06-21 ENCOUNTER — Other Ambulatory Visit (INDEPENDENT_AMBULATORY_CARE_PROVIDER_SITE_OTHER): Payer: Self-pay | Admitting: Physician Assistant

## 2021-08-20 ENCOUNTER — Other Ambulatory Visit (INDEPENDENT_AMBULATORY_CARE_PROVIDER_SITE_OTHER): Payer: Self-pay | Admitting: Physician Assistant

## 2021-08-20 DIAGNOSIS — I10 Essential (primary) hypertension: Secondary | ICD-10-CM

## 2021-09-12 ENCOUNTER — Encounter (INDEPENDENT_AMBULATORY_CARE_PROVIDER_SITE_OTHER): Payer: Self-pay | Admitting: Physician Assistant

## 2021-09-19 ENCOUNTER — Other Ambulatory Visit (INDEPENDENT_AMBULATORY_CARE_PROVIDER_SITE_OTHER): Payer: Self-pay | Admitting: Physician Assistant

## 2021-10-30 ENCOUNTER — Encounter (INDEPENDENT_AMBULATORY_CARE_PROVIDER_SITE_OTHER): Payer: Self-pay | Admitting: Family Medicine

## 2021-10-30 ENCOUNTER — Other Ambulatory Visit: Payer: Self-pay

## 2021-10-30 ENCOUNTER — Ambulatory Visit: Payer: Medicare PPO | Attending: Physician Assistant | Admitting: Family Medicine

## 2021-10-30 VITALS — BP 132/78 | HR 61 | Temp 97.1°F | Ht 65.0 in | Wt 213.8 lb

## 2021-10-30 DIAGNOSIS — R059 Cough, unspecified: Secondary | ICD-10-CM | POA: Insufficient documentation

## 2021-10-30 DIAGNOSIS — J069 Acute upper respiratory infection, unspecified: Secondary | ICD-10-CM | POA: Insufficient documentation

## 2021-10-30 DIAGNOSIS — Z6835 Body mass index (BMI) 35.0-35.9, adult: Secondary | ICD-10-CM | POA: Insufficient documentation

## 2021-10-30 DIAGNOSIS — N390 Urinary tract infection, site not specified: Secondary | ICD-10-CM | POA: Insufficient documentation

## 2021-10-30 DIAGNOSIS — R252 Cramp and spasm: Secondary | ICD-10-CM | POA: Insufficient documentation

## 2021-10-30 MED ORDER — NITROFURANTOIN MACROCRYSTAL 50 MG CAPSULE
50.0000 mg | ORAL_CAPSULE | Freq: Every evening | ORAL | 0 refills | Status: DC
Start: 2021-10-30 — End: 2022-03-13

## 2021-10-30 MED ORDER — AZITHROMYCIN 250 MG TABLET
ORAL_TABLET | ORAL | 0 refills | Status: DC
Start: 2021-10-30 — End: 2021-11-29

## 2021-10-30 MED ORDER — TIZANIDINE 4 MG TABLET
ORAL_TABLET | ORAL | 0 refills | Status: DC
Start: 2021-10-30 — End: 2021-11-29

## 2021-10-30 MED ORDER — PROMETHAZINE-DM 6.25 MG-15 MG/5 ML ORAL SYRUP
5.0000 mL | ORAL_SOLUTION | Freq: Four times a day (QID) | ORAL | 0 refills | Status: DC | PRN
Start: 2021-10-30 — End: 2021-11-29

## 2021-10-30 NOTE — Progress Notes (Signed)
[facility]  [address]  [place], [place] [address]  P: [phone]  F: [phone]       NAME:  Joanna Black  DOB:  [birth year]  AGE:  75 y.o.  MRN:  [id number]  APPT:  10/30/2021     Chief Complaint   Patient presents with   . Cough   . Chest Congestion      Subjective:     This is a case of a 75 y.o. year old female who comes in today with her husband for symptoms of intermittent cough, fatigue, and chest congestion present for the last 2 days. Patient denies symptoms of chest pain, irregular heart beats, shortness of breath, abdominal pain, or change in bowel/bladder function. Patient has tested negative for Covid 19 infection. She would like to request refills on her muscle relaxer and Abx. for recurrent UTI. She is without additional acute issues or concerns.    Past Medical History:   Diagnosis Date   . Arthritis    . Back problem    . BMI 35.0-35.9,adult 02/16/2020   . CAD (coronary artery disease)     mild   . Cataracts, bilateral    . CHF (congestive heart failure) (CMS HCC)    . Chronic bronchitis with emphysema (CMS HCC)    . Chronic low back pain    . Claustrophobia    . Depression    . Diabetes (CMS [place])    . Esophageal reflux    . Essential hypertension 01/28/2019   . Hypercholesterolemia    . Hypertension    . Hypertriglyceridemia    . Type 2 diabetes mellitus (CMS HCC)          Past Surgical History:   Procedure Laterality Date   . CATARACT EXTRACTION, BILATERAL Bilateral    . COLONOSCOPY     . ESOPHAGOGASTRODUODENOSCOPY     . HX CARPAL TUNNEL RELEASE     . HX CATARACT REMOVAL Right 12/30/2019   . HX CATARACT REMOVAL Left 01/06/2020   . HX CHOLECYSTECTOMY     . HX EXPOSURE TO METAL SHAVINGS     . HX HEART CATHETERIZATION     . HX HYSTERECTOMY     . HX OOPHORECTOMY     . HX TAH AND BSO     . HX TUBAL LIGATION     . HX VEIN STRIPPING      Left leg   . PHACO WITH INTRAOCULAR LENS Left 01/06/2020    Performed by Gwendalyn Ege, MD at [facility]   . PHACO WITH INTRAOCULAR LENS Right  12/30/2019    Performed by Gwendalyn Ege, MD at [facility] History:     Problem Relation (Age of Onset)    Bone cancer Father    Diabetes Multiple family members    Hypertension (High Blood Pressure) Multiple family members    No Known Problems Mother    Stomach Cancer Paternal Grandmother          Social History     Socioeconomic History   . Marital status: Married     Spouse name: [name]   . Number of children: 3   . Years of education: 33   . Highest education level: High school graduate   Occupational History   . Occupation: Retired   Tobacco Use   . Smoking status: Never   . Smokeless tobacco: Never   Vaping Use   . Vaping  Use: Never used   Substance and Sexual Activity   . Alcohol use: No     Alcohol/week: 0.0 standard drinks   . Drug use: No   . Sexual activity: Not Currently     Partners: Male   Other Topics Concern   . Right hand dominant Yes   . Ability to Walk 1 Flight of Steps without SOB/CP No   . Ability To Do Own ADL's Yes   Social History Narrative    MARRIED.  Lives in single story home.  Heats home with electric and has well water.        April Haney, LPN  5/68/1275, 17:00     Current Outpatient Medications   Medication Sig   . acetaminophen (TYLENOL) 325 mg Oral Tablet Take 325 mg by mouth Every night Takes 2 at night   . albuterol sulfate (PROVENTIL OR VENTOLIN OR PROAIR) 90 mcg/actuation Inhalation HFA Aerosol Inhaler Take 1-2 Puffs by inhalation Every 6 hours as needed   . aspirin (ECOTRIN) 81 mg Oral Tablet, Delayed Release (E.C.) Take 81 mg by mouth Once a day   . azithromycin (ZITHROMAX) 250 mg Oral Tablet Take 500 mg (2 tab) on day 1; take 250 mg (1 tab) on days 2-5.   Marland Kitchen Blood Sugar Diagnostic (BLOOD GLUCOSE TEST) Strip 1 Strip Once a day   . Blood-Glucose Meter (BLOOD GLUCOSE MONITORING) Kit 1 Each Once a day   . calcium carbonate/vitamin D3 (VITAMIN D-3 ORAL) Take by mouth   . diclofenac sodium (VOLTAREN) 50 mg Oral Tablet, Delayed Release (E.C.) TAKE 1 TABLET BY  MOUTH TWICE A DAY   . ezetimibe (ZETIA) 10 mg Oral Tablet TAKE 1 TABLET BY MOUTH EVERY DAY IN THE EVENING   . FLUoxetine (PROZAC) 20 mg Oral Capsule Take 1 Capsule (20 mg total) by mouth Once a day   . Irbesartan (AVAPRO) 75 mg Oral Tablet TAKE 1 TABLET BY MOUTH EVERY DAY (STOP LOSARTAN/HCTZ)   . Lancets Misc 1 Each Once a day   . Methylprednisolone (MEDROL DOSEPACK) 4 mg Oral Tablets, Dose Pack Take as instructed. (Patient not taking: Reported on 10/30/2021)   . metoprolol succinate (TOPROL-XL) 25 mg Oral Tablet Sustained Release 24 hr TAKE 1 TABLET BY MOUTH EVERY DAY AT NIGHT   . nitrofurantoin macrocrystaL (MACRODANTIN) 50 mg Oral Capsule Take 1 Capsule (50 mg total) by mouth Every night   . omeprazole (PRILOSEC) 20 mg Oral Capsule, Delayed Release(E.C.) TAKE 1 CAPSULE BY MOUTH EVERY DAY   . permethrin (ELIMITE) 5 % Cream PLEASE SEE ATTACHED FOR DETAILED DIRECTIONS (Patient not taking: Reported on 10/30/2021)   . phenazopyridine (PYRIDIUM) 200 mg Oral Tablet Take 1 Tablet (200 mg total) by mouth Three times a day as needed for Pain (urinary burning) (Patient not taking: Reported on 10/30/2021)   . promethazine-dextromethorphan (PHENERGAN-DM) 6.25-15 mg/5 mL Oral Syrup Take 5 mL by mouth Four times a day as needed for Cough   . psyllium husk (METAMUCIL ORAL) Take by mouth   . tiZANidine (ZANAFLEX) 4 mg Oral Tablet TAKE 1 TABLET THREE TIMES A DAY AS NEEDED FOR MUSCLE CRAMPS Strength: 4 mg   . TRULICITY 1.74 BS/4.9 mL Subcutaneous Pen Injector INJECT 0.5 ML (0.75 MG) UNDER THE SKIN EVERY 7 DAYS     Review of Systems   Constitutional: Positive for fatigue. Negative for activity change, appetite change, chills, fever and unexpected weight change.   HENT: Positive for sinus pressure and sinus pain. Negative for congestion, postnasal drip, rhinorrhea, sore  throat and trouble swallowing.    Eyes: Negative for visual disturbance.   Respiratory: Positive for cough. Negative for chest tightness, shortness of breath and  wheezing.    Cardiovascular: Negative for chest pain, palpitations and leg swelling.   Gastrointestinal: Negative for abdominal pain, constipation, diarrhea, nausea and vomiting.   Genitourinary: Negative for difficulty urinating and dysuria.   Musculoskeletal: Negative for arthralgias, back pain and myalgias.   Skin: Negative for rash and wound.   Neurological: Negative for dizziness, weakness, light-headedness and headaches.   Hematological: Negative for adenopathy.   Psychiatric/Behavioral: Negative for confusion, decreased concentration and sleep disturbance. The patient is not nervous/anxious.    All other systems reviewed and are negative.    Objective:   BP 132/78 (Site: Left, Patient Position: Sitting, Cuff Size: Adult Large)   Pulse 61   Temp 36.2 C (97.1 F) (Thermal Scan)   Ht 1.651 m ('5\' 5"' )   Wt 97 kg (213 lb 12.8 oz)   SpO2 97%   BMI 35.58 kg/m     Physical Exam  Vitals reviewed.   Constitutional:       General: She is not in acute distress.     Appearance: Normal appearance. She is obese. She is not ill-appearing, toxic-appearing or diaphoretic.   HENT:      Head: Normocephalic and atraumatic.      Right Ear: Tympanic membrane, ear canal and external ear normal. There is no impacted cerumen.      Left Ear: Tympanic membrane, ear canal and external ear normal. There is no impacted cerumen.      Nose: Congestion present. No rhinorrhea.      Mouth/Throat:      Mouth: Mucous membranes are moist.      Pharynx: Oropharynx is clear. No oropharyngeal exudate or posterior oropharyngeal erythema.   Eyes:      General: No scleral icterus.        Right eye: No discharge.         Left eye: No discharge.      Extraocular Movements: Extraocular movements intact.      Conjunctiva/sclera: Conjunctivae normal.   Cardiovascular:      Rate and Rhythm: Normal rate and regular rhythm.      Pulses: Normal pulses.      Heart sounds: Normal heart sounds.   Pulmonary:      Effort: Pulmonary effort is normal. No  respiratory distress.      Breath sounds: Normal breath sounds. No wheezing, rhonchi or rales.   Musculoskeletal:         General: No swelling, tenderness or deformity. Normal range of motion.      Cervical back: Normal range of motion and neck supple.      Right lower leg: No edema.      Left lower leg: No edema.   Skin:     General: Skin is warm.      Capillary Refill: Capillary refill takes less than 2 seconds.   Neurological:      General: No focal deficit present.      Mental Status: She is alert and oriented to person, place, and time. Mental status is at baseline.      Cranial Nerves: No cranial nerve deficit.      Sensory: No sensory deficit.      Motor: No weakness.      Coordination: Coordination normal.      Gait: Gait normal.      Deep Tendon Reflexes: Reflexes normal.  Psychiatric:         Mood and Affect: Mood normal.         Behavior: Behavior normal.         Thought Content: Thought content normal.         Judgment: Judgment normal.       Assessment/Plan     1. URI (upper respiratory infection)/Cough  Patient presents today with signs and symptoms of upper respiratory infection   Patient to continue supportive care and stay well hydrated   Order placed for Zpack   Patient to contact office for update on her condition within next week.   - azithromycin (ZITHROMAX) 250 mg Oral Tablet; Take 500 mg (2 tab) on day 1; take 250 mg (1 tab) on days 2-5.  Dispense: 6 Tablet; Refill: 0  - promethazine-dextromethorphan (PHENERGAN-DM) 6.25-15 mg/5 mL Oral Syrup; Take 5 mL by mouth Four times a day as needed for Cough  Dispense: 118 mL; Refill: 0    2. Muscle cramps  Stable  Continue Zanaflex 4 mg po tid prn   - tiZANidine (ZANAFLEX) 4 mg Oral Tablet; TAKE 1 TABLET THREE TIMES A DAY AS NEEDED FOR MUSCLE CRAMPS Strength: 4 mg  Dispense: 90 Tablet; Refill: 0    3. Recurrent UTI (urinary tract infection)  Stable   Continue Macrodantin 50 mg po nightly   - nitrofurantoin macrocrystaL (MACRODANTIN) 50 mg Oral Capsule;  Take 1 Capsule (50 mg total) by mouth Every night  Dispense: 90 Capsule; Refill: 0    4. 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     Orders Placed This Encounter   . nitrofurantoin macrocrystaL (MACRODANTIN) 50 mg Oral Capsule   . tiZANidine (ZANAFLEX) 4 mg Oral Tablet   . azithromycin (ZITHROMAX) 250 mg Oral Tablet   . promethazine-dextromethorphan (PHENERGAN-DM) 6.25-15 mg/5 mL Oral Syrup     Health Maintenance Due   Topic Date Due   . Hepatitis C screening  Never done   . Adult Tdap-Td (1 - Tdap) Never done   . Shingles Vaccine (1 of 2) 12/16/2013   . Annual Wellness Visit - Calendar Year Insurers  12/23/2020   . Covid-19 Vaccine (4 - Booster for Moderna series) 12/27/2020   . Diabetes A1C  05/30/2021   . Influenza Vaccine (1) 08/23/2021   . Diabetic Retinal Exam  10/25/2021     Follow up: Return in about 1 month (around 11/29/2021) for Check Up with PCP .    The patient was given ample opportunity to ask questions and those questions were answered to the patient's satisfaction. The patient was encouraged to be involved in their own care, and all diagnoses, medications, and medication side-effects were discussed. The patient was told to contact me with any additional questions or concerns, or go to the ED in the case of an emergency.       Porchea Charrier, DO

## 2021-11-02 ENCOUNTER — Other Ambulatory Visit (INDEPENDENT_AMBULATORY_CARE_PROVIDER_SITE_OTHER): Payer: Self-pay | Admitting: Physician Assistant

## 2021-11-20 ENCOUNTER — Other Ambulatory Visit (INDEPENDENT_AMBULATORY_CARE_PROVIDER_SITE_OTHER): Payer: Self-pay | Admitting: Physician Assistant

## 2021-11-20 DIAGNOSIS — I10 Essential (primary) hypertension: Secondary | ICD-10-CM

## 2021-11-20 DIAGNOSIS — E119 Type 2 diabetes mellitus without complications: Secondary | ICD-10-CM

## 2021-11-21 ENCOUNTER — Other Ambulatory Visit (INDEPENDENT_AMBULATORY_CARE_PROVIDER_SITE_OTHER): Payer: Self-pay | Admitting: Family Medicine

## 2021-11-21 DIAGNOSIS — R252 Cramp and spasm: Secondary | ICD-10-CM

## 2021-11-23 ENCOUNTER — Other Ambulatory Visit (INDEPENDENT_AMBULATORY_CARE_PROVIDER_SITE_OTHER): Payer: Self-pay | Admitting: Family Medicine

## 2021-11-26 ENCOUNTER — Telehealth (INDEPENDENT_AMBULATORY_CARE_PROVIDER_SITE_OTHER): Payer: Self-pay | Admitting: Physician Assistant

## 2021-11-26 DIAGNOSIS — I10 Essential (primary) hypertension: Secondary | ICD-10-CM

## 2021-11-26 NOTE — Telephone Encounter (Signed)
Joanna Black stopped by and said she needs her blood pressure med refilled but didn't know the name not the milligrams

## 2021-11-29 ENCOUNTER — Encounter (INDEPENDENT_AMBULATORY_CARE_PROVIDER_SITE_OTHER): Payer: Self-pay | Admitting: Physician Assistant

## 2021-11-29 ENCOUNTER — Other Ambulatory Visit: Payer: Self-pay

## 2021-11-29 ENCOUNTER — Ambulatory Visit: Payer: Medicare PPO | Attending: Physician Assistant | Admitting: Physician Assistant

## 2021-11-29 VITALS — BP 130/74 | HR 79 | Temp 98.8°F | Resp 15 | Ht 65.0 in | Wt 215.6 lb

## 2021-11-29 DIAGNOSIS — E119 Type 2 diabetes mellitus without complications: Secondary | ICD-10-CM | POA: Insufficient documentation

## 2021-11-29 DIAGNOSIS — Z0000 Encounter for general adult medical examination without abnormal findings: Secondary | ICD-10-CM

## 2021-11-29 DIAGNOSIS — G4762 Sleep related leg cramps: Secondary | ICD-10-CM | POA: Insufficient documentation

## 2021-11-29 DIAGNOSIS — Z7985 Long-term (current) use of injectable non-insulin antidiabetic drugs: Secondary | ICD-10-CM | POA: Insufficient documentation

## 2021-11-29 DIAGNOSIS — I5022 Chronic systolic (congestive) heart failure: Secondary | ICD-10-CM

## 2021-11-29 DIAGNOSIS — R0609 Other forms of dyspnea: Secondary | ICD-10-CM | POA: Insufficient documentation

## 2021-11-29 DIAGNOSIS — R252 Cramp and spasm: Secondary | ICD-10-CM | POA: Insufficient documentation

## 2021-11-29 DIAGNOSIS — I10 Essential (primary) hypertension: Secondary | ICD-10-CM | POA: Insufficient documentation

## 2021-11-29 DIAGNOSIS — Z0001 Encounter for general adult medical examination with abnormal findings: Secondary | ICD-10-CM | POA: Insufficient documentation

## 2021-11-29 DIAGNOSIS — N183 Chronic kidney disease, stage 3 unspecified (CMS HCC): Secondary | ICD-10-CM | POA: Insufficient documentation

## 2021-11-29 DIAGNOSIS — N1831 Chronic kidney disease, stage 3a (CMS HCC): Secondary | ICD-10-CM

## 2021-11-29 DIAGNOSIS — F331 Major depressive disorder, recurrent, moderate: Secondary | ICD-10-CM

## 2021-11-29 DIAGNOSIS — Z1231 Encounter for screening mammogram for malignant neoplasm of breast: Secondary | ICD-10-CM

## 2021-11-29 LAB — CBC
HCT: 40.8 % (ref 36.0–47.0)
HGB: 13.4 g/dL (ref 12.0–15.0)
MCH: 31.6 pg (ref 27.0–32.0)
MCHC: 32.9 g/dL (ref 32.0–36.0)
MCV: 96 fL (ref 80.0–100.0)
MPV: 10.4 fL — ABNORMAL HIGH (ref 6.6–9.3)
PLATELETS: 202 10*3/uL (ref 140–450)
RBC: 4.24 10*6/uL (ref 4.20–5.40)
RDW: 13.7 % (ref 12.0–15.0)
WBC: 7.3 10*3/uL (ref 4.8–10.8)

## 2021-11-29 LAB — COMPREHENSIVE METABOLIC PNL, FASTING
ALBUMIN: 4.1 g/dL (ref 3.4–4.8)
ALKALINE PHOSPHATASE: 103 U/L (ref 55–145)
ALT (SGPT): 19 U/L (ref 8–22)
ANION GAP: 7 mmol/L (ref 4–13)
AST (SGOT): 21 U/L (ref 8–45)
BILIRUBIN TOTAL: 0.6 mg/dL (ref 0.3–1.3)
BUN/CREA RATIO: 13 (ref 6–22)
BUN: 20 mg/dL (ref 8–25)
CALCIUM: 9.4 mg/dL (ref 8.8–10.2)
CHLORIDE: 104 mmol/L (ref 96–111)
CO2 TOTAL: 29 mmol/L (ref 23–31)
CREATININE: 1.51 mg/dL — ABNORMAL HIGH (ref 0.60–1.05)
ESTIMATED GFR: 36 mL/min/BSA — ABNORMAL LOW (ref >=60)
GLUCOSE: 108 mg/dL — ABNORMAL HIGH (ref 70–99)
POTASSIUM: 4.9 mmol/L (ref 3.5–5.1)
PROTEIN TOTAL: 7.5 g/dL (ref 6.0–8.0)
SODIUM: 140 mmol/L (ref 136–145)

## 2021-11-29 LAB — LIPID PANEL
CHOL/HDL RATIO: 3.9
CHOLESTEROL: 221 mg/dL — ABNORMAL HIGH (ref 100–200)
HDL CHOL: 57 mg/dL (ref >=50)
LDL CALC: 124 mg/dL — ABNORMAL HIGH (ref <100)
NON-HDL: 164 mg/dL (ref <=190)
TRIGLYCERIDES: 200 mg/dL — ABNORMAL HIGH (ref <150)
VLDL CALC: 40 mg/dL — ABNORMAL HIGH (ref <30)

## 2021-11-29 LAB — HGA1C (HEMOGLOBIN A1C WITH EST AVG GLUCOSE)
ESTIMATED AVERAGE GLUCOSE: 134 mg/dL
HEMOGLOBIN A1C: 6.3 % — ABNORMAL HIGH (ref 4.8–6.2)

## 2021-11-29 LAB — B-TYPE NATRIURETIC PEPTIDE: BNP: 35 pg/mL (ref <=99)

## 2021-11-29 LAB — MAGNESIUM: MAGNESIUM: 2.3 mg/dL (ref 1.8–2.6)

## 2021-11-29 MED ORDER — TIZANIDINE 4 MG TABLET
ORAL_TABLET | ORAL | 0 refills | Status: DC
Start: 2021-11-29 — End: 2022-03-13

## 2021-11-29 NOTE — Nursing Note (Signed)
11/29/21 0800   PHQ 9 (follow up)   Little interest or pleasure in doing things. 1   Feeling down, depressed, or hopeless 0   Trouble falling or staying asleep, or sleeping too much. 2   Feeling tired or having little energy 2   Poor appetite or overeating 0   Feeling bad about yourself/ that you are a failure in the past 2 weeks? 0   Trouble concentrating on things in the past 2 weeks? 1   Moving/Speaking slowly or being fidgety or restless  in the past 2 weeks? 0   Thoughts that you would be better off DEAD, or of hurting yourself in some way. 0   If you checked off any problems, how difficult have these problems made it for you to do your work, take care of things at home, or get along with other people? Not difficult at all   PHQ 9 Total 6

## 2021-11-29 NOTE — Progress Notes (Signed)
FAMILY MEDICINE, [place] [id number]    Medicare Annual Wellness Visit    Name: Joanna Black MRN:  [id number]   Date: 11/29/2021 Age: 75 y.o.       SUBJECTIVE:   Joanna Black is a 75 y.o. female for presenting for Medicare Wellness exam.   I have reviewed and reconciled the medication list with the patient today.  She is also here for her regular checkup.  She c/o of worsening DOE to where she has to sit down to catch her breath.  Says she has a "heart problem" some years ago and was told she had a "blockage". She is due for labs and is fasting this morning.  She has seen amy Mann, cardiology NP in the past but not for a couple of years.  She denies chest pain and ankle swelling.  She does complain of night time leg cramping.      Comprehensive Health Assessment:  Paper document [place] reviewed, signed and scanned into medical record    I have reviewed and updated as appropriate the past medical, family and social history. 11/29/2021 as summarized below:  Past Medical History:   Diagnosis Date   . Arthritis    . Back problem    . BMI 35.0-35.9,adult 02/16/2020   . CAD (coronary artery disease)     mild   . Cataracts, bilateral    . CHF (congestive heart failure) (CMS HCC)    . Chronic bronchitis with emphysema (CMS HCC)    . Chronic low back pain    . Claustrophobia    . Depression    . Diabetes (CMS [place])    . Esophageal reflux    . Essential hypertension 01/28/2019   . Hypercholesterolemia    . Hypertension    . Hypertriglyceridemia    . Type 2 diabetes mellitus (CMS HCC)      Past Surgical History:   Procedure Laterality Date   . Cataract extraction, bilateral Bilateral    . Colonoscopy     . Esophagogastroduodenoscopy     . Hx carpal tunnel release     . Hx cataract removal Right 12/30/2019   . Hx cataract removal Left 01/06/2020   . Hx cholecystectomy     . Hx exposure to metal shavings     . Hx heart catheterization     . Hx hysterectomy     . Hx  oophorectomy     . Hx tah and bso     . Hx tubal ligation     . Hx vein stripping       Current Outpatient Medications   Medication Sig   . acetaminophen (TYLENOL) 325 mg Oral Tablet Take 325 mg by mouth Every night Takes 2 at night   . albuterol sulfate (PROVENTIL OR VENTOLIN OR PROAIR) 90 mcg/actuation Inhalation HFA Aerosol Inhaler Take 1-2 Puffs by inhalation Every 6 hours as needed   . aspirin (ECOTRIN) 81 mg Oral Tablet, Delayed Release (E.C.) Take 81 mg by mouth Once a day   . Blood Sugar Diagnostic (BLOOD GLUCOSE TEST) Strip 1 Strip Once a day   . Blood-Glucose Meter (BLOOD GLUCOSE MONITORING) Kit 1 Each Once a day   . calcium carbonate/vitamin D3 (VITAMIN D-3 ORAL) Take by mouth   . diclofenac sodium (VOLTAREN) 50 mg Oral Tablet, Delayed Release (E.C.) TAKE 1 TABLET BY MOUTH TWICE A DAY   . ezetimibe (ZETIA) 10 mg Oral Tablet TAKE  1 TABLET BY MOUTH EVERY DAY IN THE EVENING   . famotidine (PEPCID) 40 mg Oral Tablet TAKE 1 TABLET BY MOUTH EVERY DAY IN THE EVENING   . FLUoxetine (PROZAC) 20 mg Oral Capsule Take 1 Capsule (20 mg total) by mouth Once a day   . Irbesartan (AVAPRO) 75 mg Oral Tablet TAKE 1 TABLET BY MOUTH EVERY DAY (STOP LOSARTAN/HCTZ)   . Lancets Misc 1 Each Once a day   . metoprolol succinate (TOPROL-XL) 25 mg Oral Tablet Sustained Release 24 hr TAKE 1 TABLET BY MOUTH EVERY DAY AT NIGHT   . nitrofurantoin macrocrystaL (MACRODANTIN) 50 mg Oral Capsule Take 1 Capsule (50 mg total) by mouth Every night   . psyllium husk (METAMUCIL ORAL) Take by mouth   . tiZANidine (ZANAFLEX) 4 mg Oral Tablet TAKE 1 TABLET THREE TIMES A DAY AS NEEDED FOR MUSCLE CRAMPS Strength: 4 mg   . TRULICITY 1.47 WG/9.5 mL Subcutaneous Pen Injector INJECT 0.5 ML (0.75 MG) UNDER THE SKIN EVERY 7 DAYS     Family Medical History:     Problem Relation (Age of Onset)    Bone cancer Father    Diabetes Multiple family members    Hypertension (High Blood Pressure) Multiple family members    No Known Problems Mother    Stomach Cancer  Paternal Grandmother          Social History     Socioeconomic History   . Marital status: Married     Spouse name: [name]   . Number of children: 3   . Years of education: 63   . Highest education level: High school graduate   Occupational History   . Occupation: Retired   Tobacco Use   . Smoking status: Never   . Smokeless tobacco: Never   Vaping Use   . Vaping Use: Never used   Substance and Sexual Activity   . Alcohol use: No     Alcohol/week: 0.0 standard drinks   . Drug use: No   . Sexual activity: Not Currently     Partners: Male   Other Topics Concern   . Right hand dominant Yes   . Ability to Walk 1 Flight of Steps without SOB/CP No   . Ability To Do Own ADL's Yes   Social History Narrative    MARRIED.  Lives in single story home.  Heats home with electric and has well water.        Joanna Haney, LPN  06/12/3085, 57:84       Review of Systems:  ROS     Physical exam:  Physical Exam      List of Current Health Care Providers   Care Team     PCP     Name Type Specialty Phone Number    Olegario Messier Physician Assistant [place] [phone]          Care Team     No care team found                  Health Maintenance   Topic Date Due   . Hepatitis C screening  Never done   . Adult Tdap-Td (1 - Tdap) Never done   . Shingles Vaccine (1 of 2) 12/16/2013   . Diabetes A1C  05/30/2021   . Diabetic Retinal Exam  10/25/2021   . Annual Wellness Visit  11/29/2021   . Osteoporosis screening  12/05/2030   . Influenza Vaccine  Completed   . Covid-19  Vaccine  Completed   . Pneumococcal Vaccination, Age 18+  Completed   . Meningococcal Vaccine  Aged Out   . Depression Screening  Discontinued   . Colonoscopy  Discontinued     Medicare Wellness Assessment   Medicare initial or wellness physical in the last year?: Yes  Advance Directives (optional)   Does patient have a living will or MPOA: no   Has patient provided [employer] with a copy?: no   Advance directive information given to the patient today?: no       Activities of Daily Living   Do you need help with dressing, bathing, or walking?: No   Do you need help with shopping, housekeeping, medications, or finances?: No   Do you have rugs in hallways, broken steps, or poor lighting?: No   Do you have grab bars in your bathroom, non-slip strips in your tub, and hand rails on your stairs?: Yes   Urinary Incontinence Screen (Women >=65 only)   Do you ever leak urine when you don't want to?: YES   Cognitive Function Screen (1=Yes, 0=No)   What is you age?: Correct   What is the time to the nearest hour?: Correct   What is the year?: Correct   What is the name of this clinic?: Correct   Can the patient recognize two persons (the doctor, the nurse, home help, etc.)?: Correct   What is the date of your birth? (day and month sufficient) : Correct   In what year did World War II end?: Incorrect   Who is the current president of the [place]?: Correct   Count from 20 down to 1?: Correct   What address did I give you earlier?: Correct   Total Score: 9       Hearing Screen   Have you noticed any hearing difficulties?: Yes  After whispering 9-1-6 how many numbers did the patient repeat correctly?: 1  After whispering 4-7-8 how many numbers did the patient repeat correctly?: 1   Fall Risk Screen   Do you feel unsteady when standing or walking?: Yes  Do you worry about falling?: Yes  Have you fallen in the past year?: Yes  How many times have you fallen?: Once  Were you ever injured from falling?: No  Timed up and go test (in seconds): 10   Vision Screen   Right Eye = 20: 20   Left Eye = 20: 20   Depression Screen     Little interest or pleasure in doing things.: Several Days  Feeling down, depressed, or hopeless: Not at all  PHQ 2 Total: 1  Trouble falling or staying asleep, or sleeping too much.: More than half the days  Feeling tired or having little energy: More than half the days  Poor appetite or overeating: Not at all  Feeling bad about yourself/ that you are a failure  in the past 2 weeks?: Not at all  Trouble concentrating on things in the past 2 weeks?: Several Days  Moving/Speaking slowly or being fidgety or restless  in the past 2 weeks?: Not at all  Thoughts that you would be better off DEAD, or of hurting yourself in some way.: Not at all  PHQ 9 Total: 6        OBJECTIVE:   BP 130/74   Pulse 79   Temp 37.1 C (98.8 F) (Temporal)   Resp 15   Ht 1.651 m (_0 )   Wt 97.8 kg (215 lb 9.6  oz)   SpO2 96%   BMI 35.88 kg/m        Other appropriate exam:    Health Maintenance Due   Topic Date Due   . Hepatitis C screening  Never done   . Adult Tdap-Td (1 - Tdap) Never done   . Shingles Vaccine (1 of 2) 12/16/2013   . Diabetes A1C  05/30/2021   . Diabetic Retinal Exam  10/25/2021   . 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)   Other Visit Diagnoses     Encounter for Medicare annual wellness exam    -  Primary    Muscle cramps        Relevant Medications    tiZANidine (ZANAFLEX) 4 mg Oral Tablet    DOE (dyspnea on exertion)        Relevant Orders    B-TYPE NATRIURETIC PEPTIDE    MYOCARDIAL PERFUSION COMPLETE    Nocturnal leg cramps        Relevant Orders    Magnesium    Encounter for screening mammogram for malignant neoplasm of breast        Relevant Orders    MAMMO BILATERAL SCREENING-ADDL VIEWS/BREAST US AS REQ BY RAD           Identified Risk Factors/ Recommended Actions   BMI addressed: Advised on diet, weight loss, and exercise to reduce above normal BMI.    Fall Risk Follow up plan of care: not addressed today.      Orders Placed This Encounter   . MAMMO BILATERAL SCREENING-ADDL VIEWS/BREAST  US AS REQ BY RAD   . B-TYPE NATRIURETIC PEPTIDE   . Magnesium   . Carotid Artery Duplex   . MYOCARDIAL PERFUSION COMPLETE   . tiZANidine (ZANAFLEX) 4 mg Oral Tablet          The patient has been educated about risk factors and recommended preventive care. Written Prevention Plan completed/ updated and given to patient (see After Visit Summary).    Return in about 1 year (around 11/29/2022), or checkup in 6 months.    Marin Roberts, PA-C  [facility]  [address]  [place] [address]  [phone]

## 2021-11-29 NOTE — Patient Instructions (Signed)
Medicare Preventive Services  Medicare coverage information Recommendation for YOU   Heart Disease and Diabetes   Lipid profile Every 5 years or more often if at risk for cardiovascular disease  Last Lipid Panel  (Last result in the past 2 years)      Cholesterol   HDL   LDL   Direct LDL   Triglycerides      11/29/20 0938 245   54   150  Comment: <100 mg/dL, Optimal  272-536 mg/dL, Near/Above Optimal  644-034 mg/dL, Borderline High  742-595 mg/dL, High  >=638 mg/dL, Very high     756           Diabetes Screening  yearly for those at risk for diabetes, 2 tests per year for those with prediabetes Last Glucose: 113    Diabetes Self Management Training or Medical Nutrition Therapy  For those with diabetes, up to 10 hrs initial training within a year, subsequent years up to 2 hrs of follow up training Optional for those with diabetes     Medical Nutrition Therapy Three hours of one-on-one counseling in first year, two hours in subsequent years Optional for those with diabetes, kidney disease   Intensive Behavioral Therapy for Obesity  Face-to-face counseling, first month every week, month 2-6 every other week, month 7-12 every month if continued progress is documented Optional for those with Body Mass Index 30 or higher  Your Body mass index is 35.88 kg/m.   Tobacco Cessation (Quitting) Counseling   Two attempts per year, max 4 sessions per attempt, up to 8 per year, for those with tobacco-related health condition Optional for those that use tobacco   Cancer Screening   Colorectal screening   For anyone age 70 to 67 or any age if high risk:  . Screening Colonoscopy every 10 yrs if low risk,  more frequent if higher risk  OR  . Cologuard Stool DNA test once every 3 years OR  . Fecal Occult Blood Testing yearly OR  . Flexible  Sigmoidoscopy  every 5 yr OR  . CT Colonography every 5 yrs    See your schedule below   Screening Pap Test Recommended every 3 years for all women age 63 to 39, or every five years if combined  with HPV test (routine screening not needed after total hysterectomy).  Medicare covers every 2 years, up to yearly if high risk.  Screening Pelvic Exam Medicare covers every 2 years, yearly if high risk or childbearing age with abnormal Pap in last 3 yrs. See your schedule below   Screening Mammogram   Recommended every 2 years for women age 29 to 56, or more frequent if you have a higher risk. Selectively recommended for women between 40-49 based on shared decisions about risk. Covered by Medicare up to every year for women age 79 or older See your schedule below   Lung Cancer Screening  Annual low dose computed tomography (LDCT scan) is recommended for those age 26-77 who smoked 20 pack-years and are current smokers or quit smoking within past 15 years (one pack-year= smoking one PPD for one year), after counseling by your doctor or nurse clinician about the possible benefits or harms. See your schedule below   Vaccinations   Pneumococcal Vaccine: Recommended routinely age 26+ with one or two separate vaccines based on your risk    Recommended before age 27 if medical conditions with increased risk  Seasonal Influenza Vaccine: Once every flu season   Hepatitis B Vaccine:  3 doses if risk (including anyone with diabetes or liver disease)  Shingles Vaccine: Two doses at age 66 or older  Diphtheria Tetanus Pertussis Vaccine: ONCE as adult, booster every 10 years     Immunization History   Administered Date(s) Administered   . Covid-19 Vaccine,Moderna,12 Years+ 01/20/2020, 02/17/2020, 11/01/2020   . Covid-19 Vaccine,Pfizer Bivalent Booster,69yrs+ 11/14/2021   . FLUZONE HD VACCINE (ADMIN) 09/25/2020   . High-Dose Influenza Vaccine, 65+ 11/25/2017, 10/26/2018, 09/25/2020, 11/14/2021   . INFLUENZA VIRUS VACCINE (ADMIN) 10/20/2015   . Influenza Vaccine, 6 month-adult 09/09/2008   . Influenza Vaccine, 65+ 09/07/2019   . PREVNAR 13 01/06/2013   . Pneumovax 01/06/2014   . ZOSTAVAX (VARICELLA ZOSTER VACCINE) 10/21/2013      Shingles vaccine and Diphtheria Tetanus Pertussis vaccines are available at pharmacies or local health department without a prescription.   Other Screening   Bone Densitometry   Every 24 months for anyone at risk, including postmenopausal       Glaucoma Screening   Yearly if in high risk group such as diabetes, family history, African American age 31+ or Hispanic American age 60+      Hepatitis C Screening recommended ONCE for those born between 1945-1965, or high risk for HCV infection       HIV Testing recommended routinely at least ONCE, covered every year for age 58 to 93 regardless of risk, and every year for age over 40 who ask for the test or higher risk  Yearly or up to 3 times in pregnancy         Abdominal Aortic Aneurysm Screening Ultrasound   Once between the age of 12-75 with a family history of AAA       Your Personalized Schedule for Preventive Tests   Health Maintenance: Pending and Last Completed       Date Due Completion Date    Hepatitis C screening Never done ---    Adult Tdap-Td (1 - Tdap) Never done ---    Shingles Vaccine (1 of 2) 12/16/2013 10/21/2013    Diabetes A1C 05/30/2021 11/29/2020    Diabetic Retinal Exam 10/25/2021 10/26/2019    Annual Wellness Visit 11/29/2021 11/29/2020    Osteoporosis screening 12/05/2030 12/05/2020

## 2021-11-30 ENCOUNTER — Encounter (HOSPITAL_COMMUNITY): Payer: Self-pay

## 2021-11-30 ENCOUNTER — Other Ambulatory Visit: Payer: Self-pay

## 2021-11-30 ENCOUNTER — Emergency Department
Admission: EM | Admit: 2021-11-30 | Discharge: 2021-12-01 | Disposition: A | Discharge status: Home / Self Care | Payer: Medicare PPO | Attending: FAMILY PRACTICE | Admitting: FAMILY PRACTICE

## 2021-11-30 DIAGNOSIS — M940 Chondrocostal junction syndrome [Tietze]: Secondary | ICD-10-CM | POA: Insufficient documentation

## 2021-11-30 DIAGNOSIS — R0602 Shortness of breath: Secondary | ICD-10-CM | POA: Insufficient documentation

## 2021-11-30 DIAGNOSIS — R0789 Other chest pain: Secondary | ICD-10-CM | POA: Insufficient documentation

## 2021-11-30 NOTE — ED Triage Notes (Addendum)
Patient complaining of chest pain for about 1 hour.  Pain is in center of her chest. Patient has been short of breath for over month.  Patient becomes exhausted walking across the room.  Patient's doctor is aware.  Patient seen her PCP yesterday.    Patient states pain hurts worse when she takes a deep breath.  Patient also said she has some gas pains after eating sandwich at 2100.  Patient also stated she had an episode about 5 years ago but denied having heart attack or chest pain.

## 2021-12-01 ENCOUNTER — Emergency Department (HOSPITAL_COMMUNITY): Payer: Medicare PPO

## 2021-12-01 LAB — HEPATIC FUNCTION PANEL
ALBUMIN: 3.8 g/dL (ref 3.4–4.8)
ALKALINE PHOSPHATASE: 113 U/L (ref 55–145)
ALT (SGPT): 16 U/L (ref 8–22)
AST (SGOT): 17 U/L (ref 8–45)
BILIRUBIN DIRECT: 0.1 mg/dL (ref 0.1–0.4)
BILIRUBIN TOTAL: 0.3 mg/dL (ref 0.3–1.3)
PROTEIN TOTAL: 7.6 g/dL (ref 6.0–8.0)

## 2021-12-01 LAB — ECG 12-LEAD
Atrial Rate: 66 {beats}/min
Calculated P Axis: 67 degrees
Calculated R Axis: -27 degrees
Calculated T Axis: -44 degrees
PR Interval: 200 ms
QRS Duration: 114 ms
QT Interval: 434 ms
QTC Calculation: 454 ms
Ventricular rate: 66 {beats}/min

## 2021-12-01 LAB — CBC WITH DIFF
BASOPHIL #: 0.1 10*3/uL (ref 0.00–0.20)
BASOPHIL %: 1 % (ref 0–2)
EOSINOPHIL #: 0.5 10*3/uL (ref 0.00–4.00)
EOSINOPHIL %: 6 % — ABNORMAL HIGH (ref 0–4)
HCT: 40.2 % (ref 36.0–47.0)
HGB: 13.5 g/dL (ref 12.0–15.0)
LYMPHOCYTE #: 3 10*3/uL (ref 1.00–3.80)
LYMPHOCYTE %: 32 % (ref 20–35)
MCH: 31.8 pg (ref 27.0–32.0)
MCHC: 33.5 g/dL (ref 32.0–36.0)
MCV: 95.1 fL (ref 80.0–100.0)
MONOCYTE #: 0.8 10*3/uL (ref 0.00–1.40)
MONOCYTE %: 8 % (ref 3–13)
MPV: 9.9 fL — ABNORMAL HIGH (ref 6.6–9.3)
NEUTROPHIL #: 5.1 10*3/uL (ref 2.40–7.60)
NEUTROPHIL %: 53 % (ref 50–70)
PLATELETS: 212 10*3/uL (ref 140–450)
RBC: 4.23 10*6/uL (ref 4.20–5.40)
RDW: 14.1 % (ref 12.0–15.0)
WBC: 9.5 10*3/uL (ref 4.8–10.8)

## 2021-12-01 LAB — B-TYPE NATRIURETIC PEPTIDE: BNP: 43 pg/mL (ref <=99)

## 2021-12-01 LAB — BASIC METABOLIC PANEL
ANION GAP: 10 mmol/L (ref 4–13)
BUN/CREA RATIO: 16 (ref 6–22)
BUN: 23 mg/dL (ref 8–25)
CALCIUM: 9.2 mg/dL (ref 8.8–10.2)
CHLORIDE: 103 mmol/L (ref 96–111)
CO2 TOTAL: 27 mmol/L (ref 23–31)
CREATININE: 1.44 mg/dL — ABNORMAL HIGH (ref 0.60–1.05)
ESTIMATED GFR: 38 mL/min/BSA — ABNORMAL LOW (ref >=60)
GLUCOSE: 141 mg/dL — ABNORMAL HIGH (ref 65–125)
POTASSIUM: 4.1 mmol/L (ref 3.5–5.1)
SODIUM: 140 mmol/L (ref 136–145)

## 2021-12-01 LAB — TROPONIN-I: TROPONIN I: <7 ng/L (ref <=30)

## 2021-12-01 LAB — MAGNESIUM: MAGNESIUM: 2.2 mg/dL (ref 1.8–2.6)

## 2021-12-01 MED ORDER — KETOROLAC 10 MG TABLET
10.0000 mg | ORAL_TABLET | Freq: Three times a day (TID) | ORAL | 0 refills | Status: DC | PRN
Start: 2021-12-01 — End: 2022-03-13

## 2021-12-01 MED ORDER — ASPIRIN 81 MG CHEWABLE TABLET
324.0000 mg | CHEWABLE_TABLET | ORAL | Status: AC
Start: 2021-12-01 — End: 2021-12-01
  Administered 2021-12-01: 01:00:00 324 mg via ORAL
  Filled 2021-12-01: qty 4

## 2021-12-01 NOTE — Discharge Instructions (Signed)
Take medication as prescribed. Follow up with PCP within 1 week.

## 2021-12-01 NOTE — ED Nurses Note (Signed)
Patient discharge to home.   Discharge instructions reviewed.  Prescriptions reviewed.  Patient stated understanding of discharge instructions.

## 2021-12-01 NOTE — ED Provider Notes (Signed)
Department of Emergency Medicine  [facility]  11/30/2021      Patient name: Joanna Black  Patient DOB: May 12, 1946      Patient is a 75 y.o.  female presenting to the ED with chief complaint of chest pain.     Location: left chest  Quality: sharp pain  Onset: 1 hour ago  Severity: moderate  Timing: still present but resolving with rest  Context: patient reports she was in bed when it started  Modifying factors: pain worse with deep breath  Associated symptoms: reports SOB on exertion for over 1 month    Review of Systems:  All other symptoms reviewed and are negative, unless commented on in the HPI.     Past Medical History:  Past Medical History:  Past Medical History:   Diagnosis Date    Arthritis     Back problem     BMI 35.0-35.9,adult 02/16/2020    CAD (coronary artery disease)     mild    Cataracts, bilateral     CHF (congestive heart failure) (CMS HCC)     Chronic bronchitis with emphysema (CMS HCC)     Chronic low back pain     Claustrophobia     Depression     Diabetes (CMS HCC)     Esophageal reflux     Essential hypertension 01/28/2019    Hypercholesterolemia     Hypertension     Hypertriglyceridemia     Type 2 diabetes mellitus (CMS HCC)        Past Surgical History:  Past Surgical History:   Procedure Laterality Date    Cataract extraction, bilateral Bilateral     Colonoscopy      Esophagogastroduodenoscopy      Hx carpal tunnel release      Hx cataract removal Right 12/30/2019    Hx cataract removal Left 01/06/2020    Hx cholecystectomy      Hx exposure to metal shavings      Hx heart catheterization      Hx hysterectomy      Hx oophorectomy      Hx tah and bso      Hx tubal ligation      Hx vein stripping         Social History:  Social History     Tobacco Use    Smoking status: Never    Smokeless tobacco: Never   Vaping Use    Vaping Use: Never used   Substance Use Topics    Alcohol use: No     Alcohol/week: 0.0 standard drinks    Drug use: No      Social History     Substance and Sexual Activity   Drug Use No       Family History:  Family History   Problem Relation Age of Onset    No Known Problems Mother     Bone cancer Father     Diabetes Multiple family members     Hypertension (High Blood Pressure) Multiple family members     Stomach Cancer Paternal Grandmother          Above history reviewed.  Allergies, medication list, and old records also reviewed.     Physical Exam:  Filed Vitals:    12/01/21 0000 12/01/21 0030 12/01/21 0045 12/01/21 0100   BP: (!) 148/71 (!) 159/93 (!) 176/64 (!) 177/73   Pulse: 68 67 71 63   Resp: 19 19 13  (!) 22  Temp:       SpO2: 98% 98% 98% 99%     Nursing note and vitals reviewed.  Vital signs reviewed as above. No acute distress.   Constitutional: Patient is well-developed and well-nourished.  Head: Normocephalic and atraumatic.   Eyes: Conjunctivae are normal. Pupils are equal, round, and reactive to light. EOM are intact  Neck: Soft, supple, full range of motion.  Cardiovascular: RRR.  No Murmurs/rubs/gallops. Distal pulses present and equal bilaterally.  Pulmonary/Chest: Normal breath sounds bilaterally with no distress. No audible wheezes or crackles are noted.  Abdominal: Soft, nontender, nondistended.  No rebound, guarding, or masses.  Musculoskeletal: Normal range of motion. No deformities.  No edema. Mild tenderness to palpation of left anterior chest wall  Neurological: CNs 2-12 grossly intact.  No focal deficits noted.  Skin: Warm and dry. No rash or lesions  Psychiatric: Patient has a normal mood and affect.   GCS = 15    Workup:     Labs:  Results for orders placed or performed during the hospital encounter of 11/30/21 (from the past 24 hour(s))   B-TYPE NATRIURETIC PEPTIDE   Result Value Ref Range    BNP 43 <=99 pg/mL   CBC/DIFF    Narrative    The following orders were created for panel order CBC/DIFF.  Procedure                               Abnormality         Status                     ---------                                -----------         ------                     CBC WITH JTTS[[id number]]                Abnormal            Final result                 Please view results for these tests on the individual orders.   BASIC METABOLIC PANEL   Result Value Ref Range    SODIUM 140 136 - 145 mmol/L    POTASSIUM 4.1 3.5 - 5.1 mmol/L    CHLORIDE 103 96 - 111 mmol/L    CO2 TOTAL 27 23 - 31 mmol/L    ANION GAP 10 4 - 13 mmol/L    CALCIUM 9.2 8.8 - 10.2 mg/dL    GLUCOSE 092 (H) 65 - 125 mg/dL    BUN 23 8 - 25 mg/dL    CREATININE 3.30 (H) 0.60 - 1.05 mg/dL    BUN/CREA RATIO 16 6 - 22    ESTIMATED GFR 38 (L) >=60 mL/min/BSA   HEPATIC FUNCTION PANEL   Result Value Ref Range    ALBUMIN 3.8 3.4 - 4.8 g/dL     ALKALINE PHOSPHATASE 113 55 - 145 U/L    ALT (SGPT) 16 8 - 22 U/L    AST (SGOT)  17 8 - 45 U/L    BILIRUBIN TOTAL 0.3 0.3 - 1.3 mg/dL    BILIRUBIN DIRECT 0.1 0.1 - 0.4 mg/dL    PROTEIN TOTAL 7.6 6.0 -  8.0 g/dL   MAGNESIUM   Result Value Ref Range    MAGNESIUM 2.2 1.8 - 2.6 mg/dL   TROPONIN-I   Result Value Ref Range    TROPONIN I <7 <=30 ng/L   CBC WITH DIFF   Result Value Ref Range    WBC 9.5 4.8 - 10.8 x103/uL    RBC 4.23 4.20 - 5.40 x106/uL    HGB 13.5 12.0 - 15.0 g/dL    HCT 13.0 86.5 - 78.4 %    MCV 95.1 80.0 - 100.0 fL    MCH 31.8 27.0 - 32.0 pg    MCHC 33.5 32.0 - 36.0 g/dL    RDW 69.6 29.5 - 28.4 %    PLATELETS 212 140 - 450 x103/uL    MPV 9.9 (H) 6.6 - 9.3 fL    NEUTROPHIL % 53 50 - 70 %    LYMPHOCYTE % 32 20 - 35 %    MONOCYTE % 8 3 - 13 %    EOSINOPHIL % 6 (H) 0 - 4 %    BASOPHIL % 1 0 - 2 %    NEUTROPHIL # 5.10 2.40 - 7.60 x103/uL    LYMPHOCYTE # 3.00 1.00 - 3.80 x103/uL    MONOCYTE # 0.80 0.00 - 1.40 x103/uL    EOSINOPHIL # 0.50 0.00 - 4.00 x103/uL    BASOPHIL # 0.10 0.00 - 0.20 x103/uL       Imaging:    Results for orders placed or performed during the hospital encounter of 11/30/21 (from the past 72 hour(s))   XR AP MOBILE CHEST     Status: None    Narrative    Cacie R Nehring    PROCEDURE  DESCRIPTION: XR AP MOBILE CHEST    CLINICAL INDICATION: chest pain and SOB    FINDINGS:  The lungs appear clear. There is no evidence of pneumonia, edema or effusion. Cardiomediastinal silhouette is normal.      Impression    No acute pulmonary disease.      Radiologist location ID: [id number]         Orders Placed This Encounter    XR AP MOBILE CHEST    B-TYPE NATRIURETIC PEPTIDE    CBC/DIFF    BASIC METABOLIC PANEL    HEPATIC FUNCTION PANEL    MAGNESIUM    TROPONIN-I    CBC WITH DIFF    ECG 12-LEAD    INSERT & MAINTAIN PERIPHERAL IV ACCESS    PERIPHERAL IV DRESSING CHANGE    aspirin chewable tablet 324 mg       Abnormal Lab results:  Labs Reviewed   BASIC METABOLIC PANEL - Abnormal; Notable for the following components:       Result Value    GLUCOSE 141 (*)     CREATININE 1.44 (*)     ESTIMATED GFR 38 (*)     All other components within normal limits   CBC WITH DIFF - Abnormal; Notable for the following components:    MPV 9.9 (*)     EOSINOPHIL % 6 (*)     All other components within normal limits   B-TYPE NATRIURETIC PEPTIDE - Normal   HEPATIC FUNCTION PANEL - Normal   MAGNESIUM - Normal   TROPONIN-I - Normal   CBC/DIFF    Narrative:     The following orders were created for panel order CBC/DIFF.  Procedure  Abnormality         Status                     ---------                               -----------         ------                     CBC WITH DVVO[[id number]]                Abnormal            Final result                 Please view results for these tests on the individual orders.       ECG: reviewed.   Recent Results (from the past 720 hour(s))   ECG 12-LEAD    Collection Time: 11/30/21 11:38 PM   Result Value    Ventricular rate 66    Atrial Rate 66    PR Interval 200    QRS Duration 114    QT Interval 434    QTC Calculation 454    Calculated P Axis 67    Calculated R Axis -27    Calculated T Axis -44    Narrative    Normal sinus rhythm  Anteroseptal infarct ,  age undetermined  Abnormal ECG  No previous ECGs available  Confirmed by Gregary Signs ([id number]) on 12/01/2021 12:06:35 AM          MDM:   During the patient's stay in the emergency department, the above listed imaging and/or labs were performed to assist with medical decision making and were reviewed by myself when available for review.     Patient remained stable throughout the emergency department course.      Impression:   1. Non-cardiac chest pain  2. Costochondritis    Plan:   Patient reports her chest pain has resolved and she feels good and wants to go home  RX: Toradol 10 mg PO TID prn pain #12   Follow up with PCP within 1 week.   Disposition: home with self-care    Gregary Signs, MD  12/01/2021, 00:04

## 2021-12-05 ENCOUNTER — Telehealth (INDEPENDENT_AMBULATORY_CARE_PROVIDER_SITE_OTHER): Payer: Self-pay | Admitting: Physician Assistant

## 2021-12-10 ENCOUNTER — Telehealth (INDEPENDENT_AMBULATORY_CARE_PROVIDER_SITE_OTHER): Payer: Self-pay | Admitting: Physician Assistant

## 2021-12-10 ENCOUNTER — Ambulatory Visit: Payer: Medicare PPO | Attending: Physician Assistant | Admitting: Family Medicine

## 2021-12-10 ENCOUNTER — Other Ambulatory Visit: Payer: Self-pay

## 2021-12-10 ENCOUNTER — Encounter (INDEPENDENT_AMBULATORY_CARE_PROVIDER_SITE_OTHER): Payer: Self-pay | Admitting: Physician Assistant

## 2021-12-10 VITALS — BP 120/86 | HR 87 | Temp 98.7°F | Ht 65.0 in | Wt 210.0 lb

## 2021-12-10 DIAGNOSIS — J069 Acute upper respiratory infection, unspecified: Secondary | ICD-10-CM | POA: Insufficient documentation

## 2021-12-10 DIAGNOSIS — E669 Obesity, unspecified: Secondary | ICD-10-CM | POA: Insufficient documentation

## 2021-12-10 DIAGNOSIS — Z6834 Body mass index (BMI) 34.0-34.9, adult: Secondary | ICD-10-CM | POA: Insufficient documentation

## 2021-12-10 DIAGNOSIS — I10 Essential (primary) hypertension: Secondary | ICD-10-CM

## 2021-12-10 DIAGNOSIS — Z79899 Other long term (current) drug therapy: Secondary | ICD-10-CM | POA: Insufficient documentation

## 2021-12-10 DIAGNOSIS — M940 Chondrocostal junction syndrome [Tietze]: Secondary | ICD-10-CM

## 2021-12-10 MED ORDER — AZITHROMYCIN 250 MG TABLET
ORAL_TABLET | ORAL | 0 refills | Status: DC
Start: 2021-12-10 — End: 2022-02-26

## 2021-12-10 NOTE — Progress Notes (Signed)
[facility]  [address]  [place], [place] [address]  P: [phone]  F: [phone]       NAME:  Joanna Black  DOB:  [birth year]  AGE:  75 y.o.  MRN:  [id number]  APPT:  12/10/2021     Chief Complaint   Patient presents with    Follow Up     Was seen in er; still having trouble breathing        Subjective:     This is a case of a 75 y.o. year old female who comes in today for follow up after she was evaluated in the ER on 11/30/21 for symptoms of chest discomfort and shortness of breath. Patient was found to have negative troponin, CXR, and ECG. Patient admits her shortness of breath is some improved. However, she feels as though she has some kind of infection. Of note, patient is scheduled for upcoming cardiac stress testing. She is without additional acute issues or concerns today with exception of sinus/chest congestion. Patient denies recent symptoms of fatigue, fever, chills, night sweats, chest pain, irregular heart beats, abdominal pain, or change in bowel/bladder function.     Past Medical History:   Diagnosis Date    Arthritis     Back problem     BMI 35.0-35.9,adult 02/16/2020    CAD (coronary artery disease)     mild    Cataracts, bilateral     CHF (congestive heart failure) (CMS HCC)     Chronic bronchitis with emphysema (CMS HCC)     Chronic low back pain     Claustrophobia     Depression     Diabetes (CMS HCC)     Esophageal reflux     Essential hypertension 01/28/2019    Hypercholesterolemia     Hypertension     Hypertriglyceridemia     Type 2 diabetes mellitus (CMS HCC)          Past Surgical History:   Procedure Laterality Date    CATARACT EXTRACTION, BILATERAL Bilateral     COLONOSCOPY      ESOPHAGOGASTRODUODENOSCOPY      HX CARPAL TUNNEL RELEASE      HX CATARACT REMOVAL Right 12/30/2019    HX CATARACT REMOVAL Left 01/06/2020    HX CHOLECYSTECTOMY      HX EXPOSURE TO METAL SHAVINGS      HX HEART CATHETERIZATION      HX HYSTERECTOMY      HX  OOPHORECTOMY      HX TAH AND BSO      HX TUBAL LIGATION      HX VEIN STRIPPING      Left leg    PHACO WITH INTRAOCULAR LENS Left 01/06/2020    Performed by Gwendalyn Ege, MD at [place] Right 12/30/2019    Performed by Gwendalyn Ege, MD at [place] History:     Problem Relation (Age of Onset)    Bone cancer Father    Diabetes Multiple family members    Hypertension (High Blood Pressure) Multiple family members    No Known Problems Mother    Stomach Cancer Paternal Grandmother          Social History     Socioeconomic History    Marital status: Married     Spouse name: [name]    Number of children: 3    Years of education: 33  Highest education level: High school graduate   Occupational History    Occupation: Retired   Tobacco Use    Smoking status: Never    Smokeless tobacco: Never   Vaping Use    Vaping Use: Never used   Substance and Sexual Activity    Alcohol use: No     Alcohol/week: 0.0 standard drinks    Drug use: No    Sexual activity: Not Currently     Partners: Male   Other Topics Concern    Right hand dominant Yes    Ability to Walk 1 Flight of Steps without SOB/CP No    Ability To Do Own ADL's Yes   Social History Narrative    MARRIED.  Lives in single story home.  Heats home with electric and has well water.        April Haney, LPN  1/47/8295, 62:13     Current Outpatient Medications   Medication Sig    acetaminophen (TYLENOL) 325 mg Oral Tablet Take 325 mg by mouth Every night Takes 2 at night    albuterol sulfate (PROVENTIL OR VENTOLIN OR PROAIR) 90 mcg/actuation Inhalation HFA Aerosol Inhaler Take 1-2 Puffs by inhalation Every 6 hours as needed    aspirin (ECOTRIN) 81 mg Oral Tablet, Delayed Release (E.C.) Take 81 mg by mouth Once a day    azithromycin (ZITHROMAX) 250 mg Oral Tablet Take 500 mg (2 tab) on day 1; take 250 mg (1 tab) on days 2-5.    Blood Sugar Diagnostic (BLOOD GLUCOSE TEST) Strip 1 Strip Once a day  (Patient not taking: Reported on 12/10/2021)    Blood-Glucose Meter (BLOOD GLUCOSE MONITORING) Kit 1 Each Once a day (Patient not taking: Reported on 12/10/2021)    calcium carbonate/vitamin D3 (VITAMIN D-3 ORAL) Take by mouth    diclofenac sodium (VOLTAREN) 50 mg Oral Tablet, Delayed Release (E.C.) TAKE 1 TABLET BY MOUTH TWICE A DAY    ezetimibe (ZETIA) 10 mg Oral Tablet TAKE 1 TABLET BY MOUTH EVERY DAY IN THE EVENING    famotidine (PEPCID) 40 mg Oral Tablet TAKE 1 TABLET BY MOUTH EVERY DAY IN THE EVENING    FLUoxetine (PROZAC) 20 mg Oral Capsule Take 1 Capsule (20 mg total) by mouth Once a day    Irbesartan (AVAPRO) 75 mg Oral Tablet TAKE 1 TABLET BY MOUTH EVERY DAY (STOP LOSARTAN/HCTZ)    ketorolac tromethamine (TORADOL) 10 mg Oral Tablet Take 1 Tablet (10 mg total) by mouth Three times a day as needed for Pain    Lancets Misc 1 Each Once a day (Patient not taking: Reported on 12/10/2021)    metoprolol succinate (TOPROL-XL) 25 mg Oral Tablet Sustained Release 24 hr TAKE 1 TABLET BY MOUTH EVERY DAY AT NIGHT    nitrofurantoin macrocrystaL (MACRODANTIN) 50 mg Oral Capsule Take 1 Capsule (50 mg total) by mouth Every night    psyllium husk (METAMUCIL ORAL) Take by mouth    tiZANidine (ZANAFLEX) 4 mg Oral Tablet TAKE 1 TABLET THREE TIMES A DAY AS NEEDED FOR MUSCLE CRAMPS Strength: 4 mg    TRULICITY 0.86 VH/8.4 mL Subcutaneous Pen Injector INJECT 0.5 ML (0.75 MG) UNDER THE SKIN EVERY 7 DAYS     Review of Systems   Constitutional: Negative for activity change, appetite change, chills, fatigue, fever and unexpected weight change.   HENT: Positive for congestion, sinus pressure and sinus pain. Negative for ear discharge, ear pain, postnasal drip, rhinorrhea, sore throat and trouble swallowing.    Eyes: Negative for visual  disturbance.   Respiratory: Negative for cough, chest tightness, shortness of breath and wheezing.    Cardiovascular: Negative for chest pain, palpitations and leg swelling.    Gastrointestinal: Negative for abdominal pain, constipation, diarrhea, nausea and vomiting.   Genitourinary: Negative for difficulty urinating and dysuria.   Musculoskeletal: Negative for arthralgias, back pain and myalgias.   Skin: Negative for rash and wound.   Neurological: Negative for dizziness, weakness, light-headedness and headaches.   Hematological: Negative for adenopathy.   Psychiatric/Behavioral: Negative for confusion, decreased concentration and sleep disturbance. The patient is not nervous/anxious.    All other systems reviewed and are negative.    Objective:   BP 120/86 (Site: Left, Patient Position: Sitting, Cuff Size: Adult)    Pulse 87    Temp 37.1 C (98.7 F) (Temporal)    Ht 1.651 m ('5\' 5"' )    Wt 95.3 kg (210 lb)    SpO2 91%    BMI 34.95 kg/m     Physical Exam  Vitals reviewed.   Constitutional:       General: She is not in acute distress.     Appearance: Normal appearance. She is obese. She is not ill-appearing, toxic-appearing or diaphoretic.   HENT:      Head: Normocephalic and atraumatic.   Eyes:      General: No scleral icterus.        Right eye: No discharge.         Left eye: No discharge.      Extraocular Movements: Extraocular movements intact.      Conjunctiva/sclera: Conjunctivae normal.   Cardiovascular:      Rate and Rhythm: Normal rate and regular rhythm.      Pulses: Normal pulses.      Heart sounds: Normal heart sounds.   Pulmonary:      Effort: Pulmonary effort is normal. No respiratory distress.      Breath sounds: Normal breath sounds. No wheezing, rhonchi or rales.   Musculoskeletal:         General: No swelling, tenderness or deformity. Normal range of motion.      Cervical back: Normal range of motion and neck supple.      Right lower leg: No edema.      Left lower leg: No edema.   Skin:     General: Skin is warm.      Capillary Refill: Capillary refill takes less than 2 seconds.   Neurological:      General: No focal deficit present.      Mental Status: She is alert  and oriented to person, place, and time. Mental status is at baseline.      Cranial Nerves: No cranial nerve deficit.      Sensory: No sensory deficit.      Motor: No weakness.      Coordination: Coordination normal.      Gait: Gait normal.      Deep Tendon Reflexes: Reflexes normal.   Psychiatric:         Mood and Affect: Mood normal.         Behavior: Behavior normal.         Thought Content: Thought content normal.         Judgment: Judgment normal.       Assessment/Plan     1. URI (upper respiratory infection)/Costochondritis  Order placed for Zpack   Suspect patient's chest discomfort is secondary to costochondritis   Patient to continue supportive care and stay well hydrated   -  azithromycin (ZITHROMAX) 250 mg Oral Tablet; Take 500 mg (2 tab) on day 1; take 250 mg (1 tab) on days 2-5.  Dispense: 6 Tablet; Refill: 0    2. Essential hypertension  BP Readings from Last 2 Encounters:   12/10/21 120/86   12/01/21 136/80   Patient presents today with normal BP of 120/86 with HR of 87  Continue irbesartan 75 mg p.o. once daily along with Toprol-XL 25 mg p.o. nightly  Order placed for repeat TTE   Patient has cardiac stress testing scheduled for 01/28/22  - TRANSTHORACIC ECHOCARDIOGRAM - ADULT; Future    3. Obesity (BMI 30.0-34.9)  BMI Plan of Care: Intervention for BMI inappropriate due to age over 73 and comorbidities.     Orders Placed This Encounter    TRANSTHORACIC ECHOCARDIOGRAM - ADULT    azithromycin (ZITHROMAX) 250 mg Oral Tablet     Health Maintenance Due   Topic Date Due    Hepatitis C screening  Never done    Adult Tdap-Td (1 - Tdap) Never done    Shingles Vaccine (1 of 2) 12/16/2013    Diabetic Retinal Exam  10/25/2021     Follow up: Return in about 8 weeks (around 02/04/2022) for Check Up with PCP .    The patient was given ample opportunity to ask questions and those questions were answered to the patient's satisfaction. The patient was encouraged to be involved in their own care, and all  diagnoses, medications, and medication side-effects were discussed. The patient was told to contact me with any additional questions or concerns, or go to the ED in the case of an emergency.       Flay Ghosh, DO

## 2021-12-10 NOTE — Nursing Note (Signed)
12/10/21 1000   Please answer the following questions on a scale of 0 to 10   Overall Pain Rating [id number] 8   Activity- during the past 24 hrs, pain has interfered with your usual activity 10   Sleep- during the past 24 hrs, pain has interfered with your sleep 8   Mood- during the past 24 hours, pain has affected your mood 10   Stress- during the past 24 hours, pain has contributed to your stress 10

## 2021-12-10 NOTE — Telephone Encounter (Signed)
Pt notified of appt at [facility] for Echocardiogram on Monday, 12/31/21, at 6 pm.  She voiced understanding.

## 2021-12-14 ENCOUNTER — Other Ambulatory Visit (INDEPENDENT_AMBULATORY_CARE_PROVIDER_SITE_OTHER): Payer: Self-pay | Admitting: Physician Assistant

## 2021-12-18 ENCOUNTER — Other Ambulatory Visit (INDEPENDENT_AMBULATORY_CARE_PROVIDER_SITE_OTHER): Payer: Self-pay | Admitting: Physician Assistant

## 2021-12-19 ENCOUNTER — Other Ambulatory Visit (INDEPENDENT_AMBULATORY_CARE_PROVIDER_SITE_OTHER): Payer: Self-pay | Admitting: Physician Assistant

## 2021-12-19 MED ORDER — ALBUTEROL SULFATE HFA 90 MCG/ACTUATION AEROSOL INHALER
1.0000 | INHALATION_SPRAY | Freq: Four times a day (QID) | RESPIRATORY_TRACT | 3 refills | Status: AC | PRN
Start: 2021-12-19 — End: ?

## 2021-12-24 ENCOUNTER — Other Ambulatory Visit (INDEPENDENT_AMBULATORY_CARE_PROVIDER_SITE_OTHER): Payer: Self-pay | Admitting: Physician Assistant

## 2021-12-24 DIAGNOSIS — R252 Cramp and spasm: Secondary | ICD-10-CM

## 2021-12-25 ENCOUNTER — Encounter (HOSPITAL_COMMUNITY): Payer: Self-pay

## 2021-12-25 ENCOUNTER — Inpatient Hospital Stay (HOSPITAL_BASED_OUTPATIENT_CLINIC_OR_DEPARTMENT_OTHER)
Admission: RE | Admit: 2021-12-25 | Discharge: 2021-12-25 | Disposition: A | Discharge status: Home / Self Care | Payer: Medicare PPO | Source: Ambulatory Visit | Attending: Physician Assistant | Admitting: Physician Assistant

## 2021-12-25 ENCOUNTER — Inpatient Hospital Stay
Admission: RE | Admit: 2021-12-25 | Discharge: 2021-12-25 | Disposition: A | Discharge status: Home / Self Care | Payer: Medicare PPO | Source: Ambulatory Visit | Attending: Physician Assistant | Admitting: Physician Assistant

## 2021-12-25 ENCOUNTER — Other Ambulatory Visit: Payer: Self-pay

## 2021-12-25 DIAGNOSIS — E119 Type 2 diabetes mellitus without complications: Secondary | ICD-10-CM | POA: Insufficient documentation

## 2021-12-25 DIAGNOSIS — Z1231 Encounter for screening mammogram for malignant neoplasm of breast: Secondary | ICD-10-CM | POA: Insufficient documentation

## 2021-12-26 DIAGNOSIS — I6523 Occlusion and stenosis of bilateral carotid arteries: Secondary | ICD-10-CM

## 2021-12-26 DIAGNOSIS — I771 Stricture of artery: Secondary | ICD-10-CM

## 2021-12-26 DIAGNOSIS — I7789 Other specified disorders of arteries and arterioles: Secondary | ICD-10-CM

## 2021-12-27 ENCOUNTER — Other Ambulatory Visit (INDEPENDENT_AMBULATORY_CARE_PROVIDER_SITE_OTHER): Payer: Self-pay | Admitting: Physician Assistant

## 2021-12-31 ENCOUNTER — Ambulatory Visit
Admission: RE | Admit: 2021-12-31 | Discharge: 2021-12-31 | Disposition: A | Discharge status: Home / Self Care | Payer: Medicare PPO | Source: Ambulatory Visit | Attending: Family Medicine | Admitting: Family Medicine

## 2021-12-31 ENCOUNTER — Other Ambulatory Visit (INDEPENDENT_AMBULATORY_CARE_PROVIDER_SITE_OTHER): Payer: Self-pay | Admitting: Physician Assistant

## 2021-12-31 ENCOUNTER — Other Ambulatory Visit: Payer: Self-pay

## 2021-12-31 DIAGNOSIS — I517 Cardiomegaly: Secondary | ICD-10-CM

## 2021-12-31 DIAGNOSIS — I10 Essential (primary) hypertension: Secondary | ICD-10-CM | POA: Insufficient documentation

## 2021-12-31 DIAGNOSIS — I6522 Occlusion and stenosis of left carotid artery: Secondary | ICD-10-CM

## 2022-01-01 ENCOUNTER — Other Ambulatory Visit (INDEPENDENT_AMBULATORY_CARE_PROVIDER_SITE_OTHER): Payer: Self-pay | Admitting: Physician Assistant

## 2022-01-01 ENCOUNTER — Telehealth (INDEPENDENT_AMBULATORY_CARE_PROVIDER_SITE_OTHER): Payer: Self-pay | Admitting: Physician Assistant

## 2022-01-01 DIAGNOSIS — Z01812 Encounter for preprocedural laboratory examination: Secondary | ICD-10-CM

## 2022-01-01 NOTE — Result Encounter Note (Signed)
Tried contacting pt no answer left message to call the office back.  Skyanne Welle, MA

## 2022-01-01 NOTE — Telephone Encounter (Signed)
Pt notified of appt at [facility] for CTA Head & Neck (Intra-Extra Cranial) on Wed., 01/09/22, at 1 p.m  I told her she will need to have labs to check her kidney function prior to the test.  She voiced understanding.

## 2022-01-02 ENCOUNTER — Ambulatory Visit (HOSPITAL_COMMUNITY)
Admission: RE | Admit: 2022-01-02 | Discharge: 2022-01-02 | Disposition: A | Discharge status: Home / Self Care | Payer: Medicare PPO | Source: Ambulatory Visit | Attending: Physician Assistant | Admitting: Physician Assistant

## 2022-01-02 ENCOUNTER — Other Ambulatory Visit: Payer: Self-pay

## 2022-01-02 ENCOUNTER — Ambulatory Visit
Admission: RE | Admit: 2022-01-02 | Discharge: 2022-01-02 | Disposition: A | Discharge status: Home / Self Care | Payer: Medicare PPO | Source: Ambulatory Visit | Attending: Physician Assistant | Admitting: Physician Assistant

## 2022-01-02 DIAGNOSIS — R0609 Other forms of dyspnea: Secondary | ICD-10-CM | POA: Insufficient documentation

## 2022-01-02 MED ORDER — SODIUM CHLORIDE 0.9 % (FLUSH) INJECTION SYRINGE
10.0000 mL | INJECTION | INTRAMUSCULAR | Status: AC
Start: 2022-01-02 — End: 2022-01-02
  Administered 2022-01-02: 10 mL via INTRAVENOUS

## 2022-01-02 MED ORDER — REGADENOSON 0.4 MG/5 ML INTRAVENOUS SYRINGE
0.4000 mg | INJECTION | INTRAVENOUS | Status: AC
Start: 2022-01-02 — End: 2022-01-02
  Administered 2022-01-02: 0.4 mg via INTRAVENOUS
  Filled 2022-01-02: qty 5

## 2022-01-02 NOTE — Result Encounter Note (Signed)
Tried contacting pt no answer left message to call the office back.  Marveen Reeks, [place]  01/02/2022, 08:24

## 2022-01-03 DIAGNOSIS — R0609 Other forms of dyspnea: Secondary | ICD-10-CM | POA: Insufficient documentation

## 2022-01-03 LAB — MYOCARDIAL PERFUSION COMPLETE
Baseline HR: 55 {beats}/min
Nuc Stress EF: 60 %
Peak Diastolic BP for Stress Tests: 88 mm/Hg
Peak Systolic BP Stress Test: 150 mm/Hg
Post peak HR: 68 {beats}/min
Predicted Max HR: 144 {beats}/min
RECOVERY HR 2 MINS: 54 {beats}/min
RPP: 10200
Recovery HR: 64 {beats}/min

## 2022-01-09 ENCOUNTER — Other Ambulatory Visit (INDEPENDENT_AMBULATORY_CARE_PROVIDER_SITE_OTHER): Payer: Self-pay | Admitting: Physician Assistant

## 2022-01-09 ENCOUNTER — Other Ambulatory Visit: Payer: Self-pay

## 2022-01-09 ENCOUNTER — Telehealth (INDEPENDENT_AMBULATORY_CARE_PROVIDER_SITE_OTHER): Payer: Self-pay | Admitting: Physician Assistant

## 2022-01-09 ENCOUNTER — Ambulatory Visit
Admission: RE | Admit: 2022-01-09 | Discharge: 2022-01-09 | Disposition: A | Discharge status: Home / Self Care | Payer: Medicare PPO | Source: Ambulatory Visit | Attending: Student in an Organized Health Care Education/Training Program | Admitting: Student in an Organized Health Care Education/Training Program

## 2022-01-09 ENCOUNTER — Other Ambulatory Visit (HOSPITAL_COMMUNITY): Payer: Medicare PPO

## 2022-01-09 DIAGNOSIS — Z01812 Encounter for preprocedural laboratory examination: Secondary | ICD-10-CM | POA: Insufficient documentation

## 2022-01-09 DIAGNOSIS — I6522 Occlusion and stenosis of left carotid artery: Secondary | ICD-10-CM

## 2022-01-09 DIAGNOSIS — I5022 Chronic systolic (congestive) heart failure: Secondary | ICD-10-CM

## 2022-01-09 DIAGNOSIS — R0609 Other forms of dyspnea: Secondary | ICD-10-CM

## 2022-01-09 LAB — BASIC METABOLIC PANEL
ANION GAP: 6 mmol/L (ref 4–13)
BUN/CREA RATIO: 12 (ref 6–22)
BUN: 22 mg/dL (ref 8–25)
CALCIUM: 9.4 mg/dL (ref 8.8–10.2)
CHLORIDE: 105 mmol/L (ref 96–111)
CO2 TOTAL: 26 mmol/L (ref 23–31)
CREATININE: 1.79 mg/dL — ABNORMAL HIGH (ref 0.60–1.05)
ESTIMATED GFR: 29 mL/min/BSA — ABNORMAL LOW (ref >=60)
GLUCOSE: 112 mg/dL (ref 65–125)
POTASSIUM: 5.5 mmol/L — ABNORMAL HIGH (ref 3.5–5.1)
SODIUM: 137 mmol/L (ref 136–145)

## 2022-01-09 MED ORDER — SODIUM CHLORIDE 0.9 % IV BOLUS
40.0000 mL | INJECTION | Status: DC
Start: 2022-01-09 — End: 2022-01-09

## 2022-01-09 MED ORDER — IOPAMIDOL 370 MG IODINE/ML (76 %) INTRAVENOUS SOLUTION
80.0000 mL | INTRAVENOUS | Status: DC
Start: 2022-01-09 — End: 2022-01-09

## 2022-01-09 NOTE — Telephone Encounter (Signed)
Radiology at [facility] called and said that pt was there for a CTA of head and neck with contrast but they cannot do it because her GFR was 29 and her creatinine levels were 1.79. She said for GFR anything under 30, they cannot inject.     She wanted to know what you wanted to do from here? Do you want to tell the patient to hydrate and Alona Bene can reschedule it?

## 2022-01-10 ENCOUNTER — Telehealth (INDEPENDENT_AMBULATORY_CARE_PROVIDER_SITE_OTHER): Payer: Self-pay | Admitting: Physician Assistant

## 2022-01-10 ENCOUNTER — Encounter (INDEPENDENT_AMBULATORY_CARE_PROVIDER_SITE_OTHER): Payer: Self-pay | Admitting: Physician Assistant

## 2022-01-10 NOTE — Telephone Encounter (Addendum)
Pt notified of appt at [facility] for CTA Intra/Extra Cranial (Head & Neck) on Wed., 01/30/22, at 10 a.m.  (this is a rescheduled test, as her kidney function was not good enough to do it yesterday, but Clinton Quant wants pt to hydrate good and repeat)  Pt voiced understanding.      Pt called back and wanted me to reschedule her CTA, which I did.  New appt on Tuesday, 02/05/22, at 9:30 a.m.  Pt notified and voiced understanding.

## 2022-01-11 ENCOUNTER — Other Ambulatory Visit (INDEPENDENT_AMBULATORY_CARE_PROVIDER_SITE_OTHER): Payer: Self-pay | Admitting: Physician Assistant

## 2022-01-11 DIAGNOSIS — J439 Emphysema, unspecified: Secondary | ICD-10-CM

## 2022-01-22 ENCOUNTER — Ambulatory Visit (HOSPITAL_COMMUNITY): Payer: Self-pay

## 2022-01-28 ENCOUNTER — Ambulatory Visit (HOSPITAL_COMMUNITY): Payer: Medicare PPO

## 2022-01-28 ENCOUNTER — Encounter (HOSPITAL_COMMUNITY): Payer: Self-pay

## 2022-01-29 ENCOUNTER — Encounter (INDEPENDENT_AMBULATORY_CARE_PROVIDER_SITE_OTHER): Payer: Self-pay | Admitting: Physician Assistant

## 2022-01-30 ENCOUNTER — Ambulatory Visit (HOSPITAL_COMMUNITY): Payer: Self-pay

## 2022-02-05 ENCOUNTER — Encounter (INDEPENDENT_AMBULATORY_CARE_PROVIDER_SITE_OTHER): Payer: Self-pay | Admitting: Physician Assistant

## 2022-02-05 ENCOUNTER — Inpatient Hospital Stay
Admission: RE | Admit: 2022-02-05 | Discharge: 2022-02-05 | Disposition: A | Discharge status: Home / Self Care | Payer: Medicare PPO | Source: Ambulatory Visit | Attending: Physician Assistant | Admitting: Physician Assistant

## 2022-02-05 ENCOUNTER — Other Ambulatory Visit (INDEPENDENT_AMBULATORY_CARE_PROVIDER_SITE_OTHER): Payer: Self-pay | Admitting: Physician Assistant

## 2022-02-05 ENCOUNTER — Encounter (INDEPENDENT_AMBULATORY_CARE_PROVIDER_SITE_OTHER): Payer: Self-pay | Admitting: Vascular & Interventional Radiology

## 2022-02-05 ENCOUNTER — Other Ambulatory Visit: Payer: Self-pay

## 2022-02-05 ENCOUNTER — Ambulatory Visit (INDEPENDENT_AMBULATORY_CARE_PROVIDER_SITE_OTHER): Payer: Self-pay | Admitting: Physician Assistant

## 2022-02-05 ENCOUNTER — Other Ambulatory Visit (HOSPITAL_COMMUNITY): Payer: Medicare PPO

## 2022-02-05 DIAGNOSIS — Z01812 Encounter for preprocedural laboratory examination: Secondary | ICD-10-CM | POA: Insufficient documentation

## 2022-02-05 DIAGNOSIS — I6523 Occlusion and stenosis of bilateral carotid arteries: Secondary | ICD-10-CM

## 2022-02-05 DIAGNOSIS — I6522 Occlusion and stenosis of left carotid artery: Secondary | ICD-10-CM | POA: Insufficient documentation

## 2022-02-05 LAB — BASIC METABOLIC PANEL
ANION GAP: 6 mmol/L (ref 4–13)
BUN/CREA RATIO: 15 (ref 6–22)
BUN: 25 mg/dL (ref 8–25)
CALCIUM: 8.9 mg/dL (ref 8.8–10.2)
CHLORIDE: 102 mmol/L (ref 96–111)
CO2 TOTAL: 26 mmol/L (ref 23–31)
CREATININE: 1.67 mg/dL — ABNORMAL HIGH (ref 0.60–1.05)
ESTIMATED GFR: 32 mL/min/BSA — ABNORMAL LOW (ref >=60)
GLUCOSE: 93 mg/dL (ref 65–125)
POTASSIUM: 4.5 mmol/L (ref 3.5–5.1)
SODIUM: 134 mmol/L — ABNORMAL LOW (ref 136–145)

## 2022-02-21 ENCOUNTER — Other Ambulatory Visit (INDEPENDENT_AMBULATORY_CARE_PROVIDER_SITE_OTHER): Payer: Self-pay | Admitting: Physician Assistant

## 2022-02-21 DIAGNOSIS — I10 Essential (primary) hypertension: Secondary | ICD-10-CM

## 2022-02-25 ENCOUNTER — Ambulatory Visit (INDEPENDENT_AMBULATORY_CARE_PROVIDER_SITE_OTHER): Payer: Self-pay | Admitting: Physician Assistant

## 2022-02-26 ENCOUNTER — Ambulatory Visit (INDEPENDENT_AMBULATORY_CARE_PROVIDER_SITE_OTHER): Payer: Medicare PPO | Admitting: Physician Assistant

## 2022-02-26 ENCOUNTER — Encounter (INDEPENDENT_AMBULATORY_CARE_PROVIDER_SITE_OTHER): Payer: Self-pay | Admitting: Physician Assistant

## 2022-02-26 ENCOUNTER — Other Ambulatory Visit: Payer: Self-pay

## 2022-02-26 VITALS — BP 138/86 | HR 60 | Resp 14 | Ht 65.0 in | Wt 210.0 lb

## 2022-02-26 DIAGNOSIS — I5030 Unspecified diastolic (congestive) heart failure: Secondary | ICD-10-CM

## 2022-02-26 DIAGNOSIS — I10 Essential (primary) hypertension: Secondary | ICD-10-CM

## 2022-02-26 DIAGNOSIS — E119 Type 2 diabetes mellitus without complications: Secondary | ICD-10-CM

## 2022-02-26 DIAGNOSIS — R0609 Other forms of dyspnea: Secondary | ICD-10-CM

## 2022-02-26 DIAGNOSIS — N1832 Chronic kidney disease, stage 3b (CMS HCC): Secondary | ICD-10-CM

## 2022-02-26 DIAGNOSIS — E785 Hyperlipidemia, unspecified: Secondary | ICD-10-CM

## 2022-02-26 DIAGNOSIS — I779 Disorder of arteries and arterioles, unspecified: Secondary | ICD-10-CM

## 2022-02-26 DIAGNOSIS — Z789 Other specified health status: Secondary | ICD-10-CM

## 2022-02-26 LAB — ECG W INTERP (AMB USE ONLY)(MUSE,IN CLINIC)
Atrial Rate: 55 {beats}/min
Calculated P Axis: 27 degrees
Calculated R Axis: -38 degrees
Calculated T Axis: -41 degrees
PR Interval: 204 ms
QRS Duration: 114 ms
QT Interval: 432 ms
QTC Calculation: 413 ms
Ventricular rate: 55 {beats}/min

## 2022-02-26 NOTE — Patient Instructions (Signed)
1.  Continue medications as prescribed.  2.  Referral placed for kidney specialist (Nephrology) in [place]. Please be waiting for a call from their office to schedule.  3.  Please do not hesitate to call with any further questions/concerns.  4.  Always seek immediate medical attention if feeling too poorly for any reason.   5.  Follow-up in 6 months or always sooner if needed.

## 2022-02-26 NOTE — Progress Notes (Signed)
[facility]  [address]  [place], [place] [address]  Phone:  [phone]      CARDIOLOGY FOLLOW-UP    Name: Joanna Black                       Date of Birth: 1946-07-19   MRN:  [id number]                         Date of visit: 02/26/2022     PCP: Marin Roberts, PA-C     Chief Complaint    Shortness of Breath       Subjective  Joanna Black is a 76 y.o. year old female who presents for Shortness of Breath   to clinic.  Patient presents today to reestablish care.  She was last seen in our office 09/27/2019.  She has a past medical history of GERD, anxiety/depression, hypertension, chronic kidney disease stage IIIB, HFpEF, hyperlipidemia with statin intolerance, and diabetes.  She denies history of known coronary artery disease.  No history of cardiac stents or bypass.  No history of heart attack.  No history of stroke.  No history of rheumatic fever or scarlet fever.  No history of liver disease.  In regards to her social history, no tobacco use, alcohol use, or illicit drug use.  In regards to her family history she has 2 brothers both with history of coronary artery disease.  One brother has history of heart attack and another brother with history of cardiac stents.  She does have drug allergies to multiple statins causing myalgias.  She has tried multiple statins in the past and cannot tolerate these.  At 1 point she was on pravastatin in September 2015.  Otherwise, she cannot recall the other means of the statins that she has tried.  She also has drug allergies to Augmentin causing nausea and vomiting and amoxicillin.    Her most recent cardiac testing includes stress test performed 01/02/2022.  At that time, she had no stress-induced ischemia.  Ejection fraction calculated at 60 percent.  Echocardiogram performed 12/31/2021 noted a small left ventricle, ejection fraction of 55-60 percent, mild TR, mild MR, trace AR, and mild PVR.  Most recent carotid artery ultrasound performed 12/25/2021.   At that time she had less than 50 percent bilateral carotid artery disease.  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  CTA of the head and neck was ordered.  She notes that she did try to get this done on 2 separate occasions, but her kidney function was too poor for them to do this study.    She notes that she is a history of CHF.  Approximately 6-7 years ago she was in [place].  She notes they would stay there in the winters.  She began having shortness of breath and her oxygen saturation was low.  She was also with a lot of fluid on board.  She notes she was hospitalized for about 3 days and was treated with medications for breathing improvement and fluid removal.    She was seen in the ER 11/30/2021 for chest discomfort.  Negative troponin x1.  It was deemed this chest discomfort was noncardiac in nature, probable costochondritis.    She presents today to reestablish care.  We discussed her stress test, echocardiogram, and carotid artery ultrasound results.  She denied dyspnea at this time.  She denies  excessive fluid retention.  She denied chest discomfort.  She notes that she does not follow with Nephrology specialist.  Overall, she states that she is doing well at present.  Further review of systems as below.    Recent Lab Results:  Last CBC  (Last result in the past 2 years)      WBC   HGB   HCT   MCV   Platelets      11/30/21 2353 9.5   13.5   40.2   95.1   212          Last BMP  (Last result in the past 2 years)      Na   K   Cl   CO2   BUN   Cr   Calcium   Glucose   Glucose-Fasting        02/05/22 0944 134   4.5   102   26   25   1.67   8.9   93           Last Hepatic Panel  (Last result in the past 2 years)      Albumin   Total PTN   Total Bili   Direct Bili   Ast/SGOT   Alt/SGPT   Alk Phos        11/30/21 2353 3.8   7.6   0.3  Comment: Naproxen therapy can falsely elevate total bilirubin levels.   0.'1   17   16   ' 113           Lab Results   Component  Value Date    TRIG 200 (H) 11/29/2021    HDLCHOL 57 11/29/2021    LDLCHOL 124 (H) 11/29/2021    CHOLESTEROL 221 (H) 11/29/2021     Lab Results   Component Value Date    TSH 3.200 01/29/2019     HEMOGLOBIN A1C   Date Value Ref Range Status   11/29/2021 6.3 (H) 4.8 - 6.2 % Final   11/29/2020 6.5 (H) 4.8 - 6.2 % Final   01/19/2020 6.6 (H) 4.8 - 6.2 % Final   09/06/2019 7.0 (H) 4.8 - 6.2 % Final   01/29/2019 7.3 (H) 4.8 - 6.2 % Final     Magnesium level 11/30/2022:  2.2.    Patient Active Problem List    Diagnosis Date Noted   . CKD (chronic kidney disease) stage 3, 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/min (CMS HCC) 11/29/2021   . Mixed hyperlipidemia 04/27/2020     She is willing to try a non-statin RX for her lipids.    Will try Zetia 10 mg daily     . Episode of recurrent major depressive disorder (CMS [place]) 04/27/2020   . BMI 35.0-35.9,adult 02/16/2020   . Essential hypertension 01/28/2019   . Cervical pain (neck) 07/28/2018   . Thoracic back pain 07/28/2018   . Chronic bilateral low back pain 07/28/2018   . Diabetes (CMS [place])    . Cardiomyopathy, dilated, nonischemic (CMS [place]) 03/21/2016   . 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 Chronic GERD 02/20/2016      Past Medical History:   Diagnosis Date   . Arthritis    . Back problem    . BMI 35.0-35.9,adult 02/16/2020   . CAD (coronary artery disease)     mild   . Cataracts, bilateral    . CHF (congestive heart failure) (CMS HCC)    . Chronic bronchitis with emphysema (CMS HCC)    .  Chronic low back pain    . Claustrophobia    . Depression    . Diabetes (CMS [place])    . Esophageal reflux    . Essential hypertension 01/28/2019   . Hypercholesterolemia    . Hypertension    . Hypertriglyceridemia    . Type 2 diabetes mellitus (CMS HCC)      MEDICATIONS  Outpatient Medications Prior to Visit   Medication Sig Dispense Refill   . acetaminophen (TYLENOL) 325 mg Oral Tablet Take 1 Tablet (325 mg total) by mouth Every night Takes 2 at night     . albuterol sulfate (PROVENTIL OR  VENTOLIN OR PROAIR) 90 mcg/actuation Inhalation oral inhaler Take 1-2 Puffs by inhalation Every 6 hours as needed 1 Each 3   . aspirin (ECOTRIN) 81 mg Oral Tablet, Delayed Release (E.C.) Take 1 Tablet (81 mg total) by mouth Once a day     . Blood Sugar Diagnostic (BLOOD GLUCOSE TEST) Strip 1 Strip Once a day (Patient not taking: Reported on 12/10/2021) 100 Each 1   . Blood-Glucose Meter (BLOOD GLUCOSE MONITORING) Kit 1 Each Once a day (Patient not taking: Reported on 12/10/2021) 1 Each 0   . calcium carbonate/vitamin D3 (VITAMIN D-3 ORAL) Take by mouth     . diclofenac sodium (VOLTAREN) 50 mg Oral Tablet, Delayed Release (E.C.) TAKE 1 TABLET BY MOUTH TWICE A DAY 60 Tablet 1   . ezetimibe (ZETIA) 10 mg Oral Tablet TAKE 1 TABLET BY MOUTH EVERY DAY IN THE EVENING 90 Tablet 1   . famotidine (PEPCID) 40 mg Oral Tablet TAKE 1 TABLET BY MOUTH EVERY DAY IN THE EVENING 90 Tablet 0   . FLUoxetine (PROZAC) 20 mg Oral Capsule TAKE 1 CAPSULE BY MOUTH EVERY DAY 90 Capsule 1   . Irbesartan (AVAPRO) 75 mg Oral Tablet TAKE 1 TABLET BY MOUTH EVERY DAY (STOP LOSARTAN/HCTZ) 90 Tablet 1   . ketorolac tromethamine (TORADOL) 10 mg Oral Tablet Take 1 Tablet (10 mg total) by mouth Three times a day as needed for Pain 12 Tablet 0   . Lancets Misc 1 Each Once a day (Patient not taking: Reported on 12/10/2021) 100 Each 1   . metoprolol succinate (TOPROL-XL) 25 mg Oral Tablet Sustained Release 24 hr TAKE 1 TABLET BY MOUTH EVERY DAY AT NIGHT 90 Tablet 1   . nitrofurantoin macrocrystaL (MACRODANTIN) 50 mg Oral Capsule Take 1 Capsule (50 mg total) by mouth Every night 90 Capsule 0   . psyllium husk (METAMUCIL ORAL) Take by mouth     . tiZANidine (ZANAFLEX) 4 mg Oral Tablet TAKE 1 TABLET THREE TIMES A DAY AS NEEDED FOR MUSCLE CRAMPS Strength: 4 mg 90 Tablet 0   . TRULICITY 4.19 QQ/2.2 mL Subcutaneous Pen Injector INJECT 0.5 ML (0.75 MG) UNDER THE SKIN EVERY 7 DAYS 6 mL 3   . azithromycin (ZITHROMAX) 250 mg Oral Tablet Take 500 mg (2 tab) on day 1;  take 250 mg (1 tab) on days 2-5. 6 Tablet 0     No facility-administered medications prior to visit.     REVIEW OF SYSTEMS:   General: No fever or chills.  HEENT: No vision changes or issues with bleeding from ears, nose, or throat.    Cardiac: No chest pain, tightness, heaviness, or discomfort. No jaw, back, neck, or arm pain. No palpitations.   Resp: No dyspnea at rest or with exertion. No cough, hemoptysis, or paroxysmal nocturnal dyspnea/orthopnea.   GI: No N/V/D, melena, or bright red blood per rectum.  Ext: No lower extremity edema or claudication.    Neuro: No lightheadedness, dizziness, presyncope, syncope, focal weakness, numbness, or tingling.  All other ROS negative    Objective: BP 138/86 (Site: Left, Patient Position: Sitting, Cuff Size: Adult)   Pulse 60   Resp 14   Ht 1.651 m ('5\' 5"' )   Wt 95.3 kg (210 lb)   SpO2 96%   BMI 34.95 kg/m        Physical Exam  Vitals and nursing note reviewed.   Constitutional:       General: She is not in acute distress.     Appearance: Normal appearance. She is not ill-appearing.      Comments: Sitting in bedside chair.   HENT:      Head: Atraumatic.   Eyes:      Conjunctiva/sclera: Conjunctivae normal.   Neck:      Vascular: No carotid bruit.   Cardiovascular:      Rate and Rhythm: Regular rhythm. Bradycardia present.      Heart sounds: Normal heart sounds. No murmur heard.    No friction rub. No gallop.   Pulmonary:      Effort: Pulmonary effort is normal.      Breath sounds: Normal breath sounds. No wheezing, rhonchi or rales.   Musculoskeletal:      Right lower leg: No edema.      Left lower leg: No edema.   Skin:     General: Skin is warm and dry.      Findings: No bruising.   Neurological:      General: No focal deficit present.      Mental Status: She is alert and oriented to person, place, and time.   Psychiatric:         Mood and Affect: Mood normal.     EKG today noting sinus bradycardia, rate of 55bpm. She is with T wave inversions leads V4-V6 and leads  II, III, and aVF. No acute ST/T wave changes. Today's EKG was compared to EKG done 11/30/2021 with no significant change noted.    Assessment/Plan  1. Heart failure with preserved ejection fraction, unspecified HF chronicity (CMS HCC)    2. DOE (dyspnea on exertion)    3. Hypertension, unspecified type    4. Hyperlipidemia, unspecified hyperlipidemia type    5. Statin intolerance    6. Vertebral artery disease (CMS HCC)    7. Stage 3b chronic kidney disease (CMS HCC)    8. Type 2 diabetes mellitus without complication, without long-term current use of insulin (CMS HCC)      Orders Placed This Encounter   . Refer to [facility]   . EKG (93000) SAME DAY IN CLINIC      1. HFpEF, no current exacerbation.  First diagnosed approximately 6-7 years ago at outside facility in [place], of which she had to receive what sounds like IV diuretic therapy. Echo 12/31/2021 noted EF of 55-60% with no severe valvular disease. BNP 12/01/2021 normal at 43.   - She currently denies excessive fluid retention or dyspnea. No complaints of PND/PNO. She does not appear overtly fluid overloaded on exam. Lung fields clear to auscultation b/l. I do not appreciate any excessive LE swelling.   - Current GDMT with ARB/irbesartan 75 mg qDay and beta blockade/Toprol XL 25 mg qDay.   - Recommend daily weights and 2g sodium restriction diet.    2. Hypertension.   - Irbesartan 75 mg qDay and Toprol XL 25 mg qDay.   -  Continue low fat low salt diet.    3. Hyperlipidemia.   - Statin intolerance. She notes that she has tried multiple statins all which cause myalgias. She is unable to recall exact names. Documented pravastatin 08/2014.   - She is with diabetes. Recommend LDL < 70, HDL > 45, and triglycerides < 150.   - Chart review notes that she has been offered alternative statin therapy, and has deferred in the past.   - Currently on Zetia 10 mg qDay.   - Could consider PCSK9 therapy with Repatha or Praluent in the future if patient willing.   - Continue low  fat low salt diet.    4. Left vertebral artery disease.  Most recent carotid artery ultrasound performed 12/25/2021.  At that time she had less than 50 percent bilateral carotid artery disease.  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     - CTA of the head and neck was ordered.  She notes that she did try to get this done on 2 separate occasions, but her kidney function was too poor for them to do this study.   - Will refer to Nephrology for CKD management.    - Recommend CTA head and neck as ordered by PCP when able.   - She denies history of stroke. She did not voice any complaints/concerns of any stroke-type symptoms. No complaints of numbness, tingling, or changes in memory, speech, or gait.   - ASA 81 mg qDay.   - Statin intolerance, but on Zetia 10 mg qDay. Consider PCSK9 therapy in the future if willing.    5. Stage IIIb CKD.  Labs 02/05/2022 noted creatinine 1.67, BUN 25, and GFR 32.   - Will refer to Nephrology at [facility] per patient request.   - Continue to avoid nephrotoxic agents, renally dose all medications, and routinely monitor renal and electrolyte status.    PLAN  1. Would continue to work on aggressive risk factor modification.  2. Patient was encouraged to call with any further questions/concerns.  3. Patient is to always seek immediate medical attention if feeling too poorly for any reason.  4. Agree with CTA head and neck when able from a kidney standpoint with IV contrast dye usage.  5. Consider PCSK9 therapy with Praluent or Repatha in the future if patient willing.  6. Referral placed for Nephrology.  7. Will continue her current cardiac treatment. She is to let us know should she have further issues with dyspnea, fluid retention, chest pain, etc.  8. Otherwise, follow-up in 6 months or always sooner if needed.      Return in about 6 months (around 08/29/2022).       NOTE:  Please note that this Assessment/Plan is Cardiology/Heart & Vascular focused.   Further medical conditions such as T2DM, HLD, GERD, anxiety/depression, and CKD are as managed by PCP and other specialists.      NOTE:  This patient was seen independently in clinic by the APP.     NOTE:  A portion of this documentation was generated using MMODAL voice recognition software and may contain syntax/voice recognition errors. Any errors are not intentional. Please call with any questions/concerns.    Wanda Plump, [facility].

## 2022-03-13 ENCOUNTER — Other Ambulatory Visit: Payer: Self-pay

## 2022-03-13 ENCOUNTER — Encounter (INDEPENDENT_AMBULATORY_CARE_PROVIDER_SITE_OTHER): Payer: Self-pay | Admitting: Family Medicine

## 2022-03-13 ENCOUNTER — Ambulatory Visit: Payer: Medicare PPO | Attending: Family Medicine | Admitting: Family Medicine

## 2022-03-13 ENCOUNTER — Telehealth (HOSPITAL_BASED_OUTPATIENT_CLINIC_OR_DEPARTMENT_OTHER): Payer: Self-pay | Admitting: Student in an Organized Health Care Education/Training Program

## 2022-03-13 VITALS — BP 136/80 | HR 57 | Temp 97.5°F | Resp 16 | Ht 65.0 in | Wt 209.6 lb

## 2022-03-13 DIAGNOSIS — Z0000 Encounter for general adult medical examination without abnormal findings: Secondary | ICD-10-CM | POA: Insufficient documentation

## 2022-03-13 DIAGNOSIS — I5022 Chronic systolic (congestive) heart failure: Secondary | ICD-10-CM | POA: Insufficient documentation

## 2022-03-13 DIAGNOSIS — Z7985 Long-term (current) use of injectable non-insulin antidiabetic drugs: Secondary | ICD-10-CM | POA: Insufficient documentation

## 2022-03-13 DIAGNOSIS — Z79899 Other long term (current) drug therapy: Secondary | ICD-10-CM | POA: Insufficient documentation

## 2022-03-13 DIAGNOSIS — Z7982 Long term (current) use of aspirin: Secondary | ICD-10-CM | POA: Insufficient documentation

## 2022-03-13 DIAGNOSIS — N183 Chronic kidney disease, stage 3 unspecified: Secondary | ICD-10-CM | POA: Insufficient documentation

## 2022-03-13 DIAGNOSIS — M62838 Other muscle spasm: Secondary | ICD-10-CM | POA: Insufficient documentation

## 2022-03-13 DIAGNOSIS — I152 Hypertension secondary to endocrine disorders: Secondary | ICD-10-CM | POA: Insufficient documentation

## 2022-03-13 DIAGNOSIS — E559 Vitamin D deficiency, unspecified: Secondary | ICD-10-CM | POA: Insufficient documentation

## 2022-03-13 DIAGNOSIS — F325 Major depressive disorder, single episode, in full remission: Secondary | ICD-10-CM | POA: Insufficient documentation

## 2022-03-13 DIAGNOSIS — E1169 Type 2 diabetes mellitus with other specified complication: Secondary | ICD-10-CM | POA: Insufficient documentation

## 2022-03-13 DIAGNOSIS — E1159 Type 2 diabetes mellitus with other circulatory complications: Secondary | ICD-10-CM | POA: Insufficient documentation

## 2022-03-13 DIAGNOSIS — E785 Hyperlipidemia, unspecified: Secondary | ICD-10-CM | POA: Insufficient documentation

## 2022-03-13 DIAGNOSIS — I779 Disorder of arteries and arterioles, unspecified: Secondary | ICD-10-CM | POA: Insufficient documentation

## 2022-03-13 DIAGNOSIS — Z9049 Acquired absence of other specified parts of digestive tract: Secondary | ICD-10-CM | POA: Insufficient documentation

## 2022-03-13 DIAGNOSIS — Z6834 Body mass index (BMI) 34.0-34.9, adult: Secondary | ICD-10-CM | POA: Insufficient documentation

## 2022-03-13 DIAGNOSIS — K529 Noninfective gastroenteritis and colitis, unspecified: Secondary | ICD-10-CM | POA: Insufficient documentation

## 2022-03-13 DIAGNOSIS — E669 Obesity, unspecified: Secondary | ICD-10-CM | POA: Insufficient documentation

## 2022-03-13 DIAGNOSIS — I130 Hypertensive heart and chronic kidney disease with heart failure and stage 1 through stage 4 chronic kidney disease, or unspecified chronic kidney disease: Secondary | ICD-10-CM | POA: Insufficient documentation

## 2022-03-13 DIAGNOSIS — K222 Esophageal obstruction: Secondary | ICD-10-CM | POA: Insufficient documentation

## 2022-03-13 DIAGNOSIS — E119 Type 2 diabetes mellitus without complications: Secondary | ICD-10-CM | POA: Insufficient documentation

## 2022-03-13 LAB — HGA1C (HEMOGLOBIN A1C WITH EST AVG GLUCOSE)
ESTIMATED AVERAGE GLUCOSE: 122
HEMOGLOBIN A1C: 5.9

## 2022-03-13 MED ORDER — COLESTIPOL 1 GRAM TABLET
1.0000 g | ORAL_TABLET | Freq: Every day | ORAL | 3 refills | Status: DC
Start: 2022-03-13 — End: 2022-03-13

## 2022-03-13 MED ORDER — TIZANIDINE 4 MG TABLET
ORAL_TABLET | ORAL | 1 refills | Status: DC
Start: 2022-03-13 — End: 2022-10-03

## 2022-03-13 MED ORDER — COLESTIPOL 1 GRAM TABLET
1.0000 g | ORAL_TABLET | Freq: Every day | ORAL | 3 refills | Status: DC
Start: 2022-03-13 — End: 2022-04-08

## 2022-03-13 NOTE — Patient Instructions (Signed)
Medicare Preventive Services  Medicare coverage information Recommendation for YOU   Heart Disease and Diabetes   Lipid profile Every 5 years or more often if at risk for cardiovascular disease  Last Lipid Panel  (Last result in the past 2 years)        Cholesterol   HDL   LDL   Direct LDL   Triglycerides      11/29/21 0847 221   57   124  Comment: <100 mg/dL, Optimal  579-038 mg/dL, Near/Above Optimal  333-832 mg/dL, Borderline High  919-166 mg/dL, High  >=060 mg/dL, Very high     045             Diabetes Screening  yearly for those at risk for diabetes, 2 tests per year for those with prediabetes Last Glucose: 93    Diabetes Self Management Training or Medical Nutrition Therapy  For those with diabetes, up to 10 hrs initial training within a year, subsequent years up to 2 hrs of follow up training Optional for those with diabetes     Medical Nutrition Therapy Three hours of one-on-one counseling in first year, two hours in subsequent years Optional for those with diabetes, kidney disease   Intensive Behavioral Therapy for Obesity  Face-to-face counseling, first month every week, month 2-6 every other week, month 7-12 every month if continued progress is documented Optional for those with Body Mass Index 30 or higher  Your Body mass index is 34.88 kg/m.   Tobacco Cessation (Quitting) Counseling   Two attempts per year, max 4 sessions per attempt, up to 8 per year, for those with tobacco-related health condition Optional for those that use tobacco   Cancer Screening   Colorectal screening   For anyone age 25 to 92 or any age if high risk:  Screening Colonoscopy every 10 yrs if low risk,  more frequent if higher risk  OR  Cologuard Stool DNA test once every 3 years OR  Fecal Occult Blood Testing yearly OR  Flexible  Sigmoidoscopy  every 5 yr OR  CT Colonography every 5 yrs    See your schedule below   Screening Pap Test Recommended every 3 years for all women age 47 to 11, or every five years if combined with HPV  test (routine screening not needed after total hysterectomy).  Medicare covers every 2 years, up to yearly if high risk.  Screening Pelvic Exam Medicare covers every 2 years, yearly if high risk or childbearing age with abnormal Pap in last 3 yrs. See your schedule below   Screening Mammogram   Recommended every 2 years for women age 25 to 87, or more frequent if you have a higher risk. Selectively recommended for women between 40-49 based on shared decisions about risk. Covered by Medicare up to every year for women age 58 or older See your schedule below   Lung Cancer Screening  Annual low dose computed tomography (LDCT scan) is recommended for those age 70-77 who smoked 20 pack-years and are current smokers or quit smoking within past 15 years (one pack-year= smoking one PPD for one year), after counseling by your doctor or nurse clinician about the possible benefits or harms. See your schedule below   Vaccinations   Pneumococcal Vaccine: Recommended routinely age 72+ with one or two separate vaccines based on your risk    Recommended before age 5 if medical conditions with increased risk  Seasonal Influenza Vaccine: Once every flu season   Hepatitis B Vaccine: 3  doses if risk (including anyone with diabetes or liver disease)  Shingles Vaccine: Two doses at age 58 or older  Diphtheria Tetanus Pertussis Vaccine: ONCE as adult, booster every 10 years     Immunization History   Administered Date(s) Administered    Covid-19 Vaccine,Moderna,12 Years+ 01/20/2020, 02/17/2020, 11/01/2020    Covid-19 Vaccine,Pfizer Bivalent Booster,23yrs+ 11/14/2021    FLUZONE HD VACCINE (ADMIN) 09/25/2020    High-Dose Influenza Vaccine, 65+ 11/25/2017, 10/26/2018, 09/25/2020, 11/14/2021    INFLUENZA VIRUS VACCINE (ADMIN) 10/20/2015    Influenza Vaccine, 6 month-adult 09/09/2008    Influenza Vaccine, 65+ 09/07/2019    PREVNAR 13 01/06/2013    Pneumovax 01/06/2014    ZOSTAVAX (VARICELLA ZOSTER VACCINE) 10/21/2013     Shingles vaccine and  Diphtheria Tetanus Pertussis vaccines are available at pharmacies or local health department without a prescription.   Other Screening   Bone Densitometry   Every 24 months for anyone at risk, including postmenopausal       Glaucoma Screening   Yearly if in high risk group such as diabetes, family history, African American age 37+ or Hispanic American age 49+      Hepatitis C Screening recommended ONCE for those born between 1945-1965, or high risk for HCV infection       HIV Testing recommended routinely at least ONCE, covered every year for age 56 to 21 regardless of risk, and every year for age over 100 who ask for the test or higher risk  Yearly or up to 3 times in pregnancy         Abdominal Aortic Aneurysm Screening Ultrasound   Once between the age of 49-75 with a family history of AAA       Your Personalized Schedule for Preventive Tests     Health Maintenance: Pending and Last Completed         Date Due Completion Date    Hepatitis C screening Never done ---    Adult Tdap-Td (1 - Tdap) Never done ---    Shingles Vaccine (1 of 2) 12/16/2013 10/21/2013    Diabetic Retinal Exam 10/25/2021 10/26/2019    Diabetes A1C 05/30/2022 11/29/2021    Osteoporosis screening 12/05/2030 12/05/2020

## 2022-03-13 NOTE — Progress Notes (Signed)
FAMILY MEDICINE, [place] [id number]    Medicare Annual Wellness Visit    Name: Joanna Black MRN:  [id number]   Date: 03/13/2022 Age: 76 y.o.     SUBJECTIVE:   Joanna Black is a 76 y.o. female for presenting for Medicare Wellness exam.   I have reviewed and reconciled the medication list with the patient today.    Comprehensive Health Assessment:  Paper document [place] reviewed and scanned into medical record    I have reviewed and updated as appropriate the past medical, family and social history. 03/13/2022 as summarized below:  Past Medical History:   Diagnosis Date   . Arthritis    . Back problem    . BMI 35.0-35.9,adult 02/16/2020   . CAD (coronary artery disease)     mild   . Cataracts, bilateral    . CHF (congestive heart failure) (CMS HCC)    . Chronic bronchitis with emphysema (CMS HCC)    . Chronic low back pain    . Claustrophobia    . Depression    . Diabetes (CMS [place])    . Esophageal reflux    . Essential hypertension 01/28/2019   . Hypercholesterolemia    . Hypertension    . Hypertriglyceridemia    . Type 2 diabetes mellitus (CMS HCC)      Past Surgical History:   Procedure Laterality Date   . Cataract extraction, bilateral Bilateral    . Colonoscopy     . Esophagogastroduodenoscopy     . Hx carpal tunnel release     . Hx cataract removal Right 12/30/2019   . Hx cataract removal Left 01/06/2020   . Hx cholecystectomy     . Hx exposure to metal shavings     . Hx heart catheterization     . Hx hysterectomy     . Hx oophorectomy     . Hx tah and bso     . Hx tubal ligation     . Hx vein stripping       Current Outpatient Medications   Medication Sig   . acetaminophen (TYLENOL) 325 mg Oral Tablet Take 1 Tablet (325 mg total) by mouth Every night Takes 2 at night   . albuterol sulfate (PROVENTIL OR VENTOLIN OR PROAIR) 90 mcg/actuation Inhalation oral inhaler Take 1-2 Puffs by inhalation Every 6 hours as needed   . aspirin (ECOTRIN) 81 mg Oral  Tablet, Delayed Release (E.C.) Take 1 Tablet (81 mg total) by mouth Once a day   . calcium carbonate/vitamin D3 (VITAMIN D-3 ORAL) Take by mouth   . Colestipol (COLESTID) 1 gram Oral Tablet Take 1 Tablet (1 g total) by mouth Once a day   . diclofenac sodium (VOLTAREN) 50 mg Oral Tablet, Delayed Release (E.C.) TAKE 1 TABLET BY MOUTH TWICE A DAY   . ezetimibe (ZETIA) 10 mg Oral Tablet TAKE 1 TABLET BY MOUTH EVERY DAY IN THE EVENING   . famotidine (PEPCID) 40 mg Oral Tablet TAKE 1 TABLET BY MOUTH EVERY DAY IN THE EVENING   . FLUoxetine (PROZAC) 20 mg Oral Capsule TAKE 1 CAPSULE BY MOUTH EVERY DAY   . Irbesartan (AVAPRO) 75 mg Oral Tablet TAKE 1 TABLET BY MOUTH EVERY DAY (STOP LOSARTAN/HCTZ)   . metoprolol succinate (TOPROL-XL) 25 mg Oral Tablet Sustained Release 24 hr TAKE 1 TABLET BY MOUTH EVERY DAY AT NIGHT   . tiZANidine (ZANAFLEX) 4 mg Oral Tablet TAKE 1 TABLET  THREE TIMES A DAY AS NEEDED FOR MUSCLE CRAMPS Strength: 4 mg   . TRULICITY A999333 [id number] mL Subcutaneous Pen Injector INJECT 0.5 ML (0.75 MG) UNDER THE SKIN EVERY 7 DAYS     Family Medical History:     Problem Relation (Age of Onset)    Bone cancer Father    Diabetes Multiple family members    Hypertension (High Blood Pressure) Multiple family members    No Known Problems Mother    Stomach Cancer Paternal Grandmother          Social History     Socioeconomic History   . Marital status: Married     Spouse name: [name]   . Number of children: 3   . Years of education: 70   . Highest education level: High school graduate   Occupational History   . Occupation: Retired   Tobacco Use   . Smoking status: Never     Passive exposure: Never   . Smokeless tobacco: Never   Vaping Use   . Vaping Use: Never used   Substance and Sexual Activity   . Alcohol use: No     Alcohol/week: 0.0 standard drinks   . Drug use: No   . Sexual activity: Not Currently     Partners: Male   Other Topics Concern   . Right hand dominant Yes   . Ability to Walk 1 Flight of Steps without SOB/CP  No   . Ability To Do Own ADL's Yes   Social History Narrative    MARRIED.  Lives in single story home.  Heats home with electric and has well water.        April Haney, LPN  [id number], [phone]        List of Current Health Care Providers   Care Team     PCP     Name Type Specialty Phone Number    Samella Parr, [place] [facility] [place] [phone]          Care Team     No care team found                  Health Maintenance   Topic Date Due   . Hepatitis C screening  Never done   . Adult Tdap-Td (1 - Tdap) Never done   . Shingles Vaccine (1 of 2) 12/16/2013   . Diabetic Retinal Exam  10/25/2021   . Diabetes A1C  09/13/2022   . Osteoporosis screening  12/05/2030   . Influenza Vaccine  Completed   . Covid-19 Vaccine  Completed   . Annual Wellness Visit - Calendar Year [employer]  Completed   . Pneumococcal Vaccination, Age 43+  Completed   . Meningococcal Vaccine  Aged Out   . Depression Screening  Discontinued     Medicare Wellness Assessment   Medicare initial or wellness physical in the last year?: Yes  Advance Directives (optional)   Does patient have a living will or MPOA: no   Has patient provided [employer] with a copy?: no   Advance directive information given to the patient today?: no      Activities of Daily Living   Do you need help with dressing, bathing, or walking?: No   Do you need help with shopping, housekeeping, medications, or finances?: No   Do you have rugs in hallways, broken steps, or poor lighting?: No   Do you have grab bars in your bathroom, non-slip strips in your tub, and hand rails on  your stairs?: No   Urinary Incontinence Screen (Women >=65 only)   Do you ever leak urine when you don't want to?: YES   Cognitive Function Screen (1=Yes, 0=No)   What is you age?: Correct   What is the time to the nearest hour?: Correct   What is the year?: Correct   What is the name of this clinic?: Incorrect   Can the patient recognize two persons (the doctor, the nurse, home help, etc.)?: Correct    What is the date of your birth? (day and month sufficient) : Correct   In what year did World War II end?: Correct   Who is the current president of the [place]?: Correct   Count from 20 down to 1?: Correct   What address did I give you earlier?: Incorrect   Total Score: 8   Interpretation of Total Score: Greater than 6 Normal   Hearing Screen   Have you noticed any hearing difficulties?: Yes  After whispering 9-1-6 how many numbers did the patient repeat correctly?: 3   Fall Risk Screen   Do you feel unsteady when standing or walking?: Yes  Do you worry about falling?: Yes  Have you fallen in the past year?: No  Timed up and go test (in seconds): 30   Vision Screen   Right Eye = 20: 20   Left Eye = 20: 20   Depression Screen   Little interest or pleasure in doing things.: Not at all  Feeling down, depressed, or hopeless: Not at all  PHQ 2 Total: 0   Pain Score   Pain Score:   0 - No pain    Substance Use-Abuse Screening   Tobacco Use   In Past 12 MONTHS, how often have you used any tobacco product (for example, cigarettes, e-cigarettes, cigars, pipes, or smokeless tobacco)?: Never   Alcohol use   In the PAST 12 MONTHS, how often have you had 5 (men)/4 (women) or more drinks containing alcohol in one day?: Never   Prescription Drug Use   In the PAST 12 months, how often have you used any prescription medications just for the feeling, more than prescribed, or that were not prescribed for you? Prescriptions may include: opioids, benzodiazepines, medications for ADHD: Never         Illicit Drug Use   In the PAST 12 MONTHS, how often have you used any drugs, including marijuana, cocaine or crack, heroin, methamphetamine, hallucinogens, ecstasy/MDMA?: Never             OBJECTIVE:   BP 136/80 (Site: Left, Patient Position: Sitting, Cuff Size: Adult)   Pulse 57   Temp 36.4 C (97.5 F) (Temporal)   Resp 16   Ht 1.651 m (5\' 5" )   Wt 95.1 kg (209 lb 9.6 oz)   SpO2 97%   BMI 34.88 kg/m        Physical  Exam  Vitals reviewed.   Constitutional:       General: She is not in acute distress.     Appearance: Normal appearance. She is obese. She is not ill-appearing, toxic-appearing or diaphoretic.   HENT:      Head: Normocephalic and atraumatic.      Nose: No congestion or rhinorrhea.   Eyes:      General: No scleral icterus.        Right eye: No discharge.         Left eye: No discharge.      Conjunctiva/sclera: Conjunctivae  normal.   Cardiovascular:      Rate and Rhythm: Normal rate and regular rhythm.      Pulses: Normal pulses.   Pulmonary:      Effort: Pulmonary effort is normal. No respiratory distress.      Breath sounds: No wheezing, rhonchi or rales.   Musculoskeletal:         General: No swelling or tenderness. Normal range of motion.      Cervical back: Normal range of motion.      Right lower leg: No edema.      Left lower leg: No edema.   Skin:     General: Skin is warm and dry.      Coloration: Skin is not jaundiced or pale.      Findings: No bruising, erythema, lesion or rash.   Neurological:      General: No focal deficit present.      Mental Status: She is alert. Mental status is at baseline.      Cranial Nerves: No cranial nerve deficit.      Motor: No weakness.      Gait: Gait normal.   Psychiatric:         Mood and Affect: Mood normal.         Behavior: Behavior normal.         Thought Content: Thought content normal.         Judgment: Judgment normal.     Diabetic foot exam:  Both feet without edema or ulcerations. Pulses normal bilaterally. Sensation normal bilaterally      Health Maintenance Due   Topic Date Due   . Hepatitis C screening  Never done   . Adult Tdap-Td (1 - Tdap) Never done   . Shingles Vaccine (1 of 2) 12/16/2013   . Diabetic Retinal Exam  10/25/2021      ASSESSMENT & PLAN:     1. Encounter for Medicare annual wellness exam  - Medicare Wellness Template Complete   - Health Risk Assessment Performed and Reviewed  - Advanced Directives Discussed and Reviewed with patient   - Health  Maintenance Forecast given to the patient within instruction section     2. Type 2 diabetes mellitus (CMS HCC)  Diabetes Monitors  A1C - Glucose - Lipids Microalbumin   Results in Last 18 Months   Lab Test 11/29/20  0938 11/29/21  0847 03/13/22  0000   HA1C 6.5* 6.3* 5.9   CHOLESTEROL 245* 221*  --    HDLCHOL 54 57  --    LDLCHOL 150* 124*  --    TRIG 206* 200*  --     No results for input(s): MICALBRNUR, MICALBCRERAT in the last 13140 hours.     Retinal Exam Date: 10/26/2019  Last Foot Exam: Not Found  Today's POC A1C well controlled at 5.9  Patient to continue Trulicity A999333 mg under the skin once every 7 days  - Urine Microalbumin/Creatinine Ratio, Random; Future  - POCT Hgb A1c  - CBC/DIFF; Future  - THYROID STIMULATING HORMONE (SENSITIVE TSH); Future  - Comp Metabolic Panel-Non Fasting; Future  - Vitamin B12; Future  - Hemoglobin A1C; Future  - Urine Microalbumin/Creatinine Ratio, Random; Future    3. Hypertension associated with type 2 diabetes mellitus (CMS HCC)/Hx. of Chronic systolic congestive heart failure (CMS HCC)/Hx. of CKD (chronic kidney disease) stage 3, 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/min (CMS HCC)  BP Readings from Last 2 Encounters:   03/13/22 136/80   02/26/22 138/86  Patient presents with normal BP of 136/80 with HR of 57  Continue aspirin 81 mg p.o. once daily along with Avapro 75 mg p.o. once daily, Toprol-XL 25 mg p.o. nightly    4. Hyperlipidemia associated with type 2 diabetes mellitus (CMS HCC)/Hx. of Vertebral artery disease (CMS [facility])  Lab Results   Component Value Date    CHOLESTEROL 221 (H) 11/29/2021    HDLCHOL 57 11/29/2021    LDLCHOL 124 (H) 11/29/2021    TRIG 200 (H) 11/29/2021   Continue Zetia 10 mg p.o. once every evening  - Lipid Panel; Future    5. 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 once daily    6. Hx. of Muscle spasm  Stable   Patient to continue Zanaflex 4 mg po tid prn   - tiZANidine (ZANAFLEX) 4 mg Oral Tablet; TAKE 1 TABLET THREE TIMES A DAY AS  NEEDED FOR MUSCLE CRAMPS Strength: 4 mg  Dispense: 90 Tablet; Refill: 1    7. Chronic diarrhea/Hx. of Esophageal stricture/History of cholecystectomy  Patient with progressive symptoms of intermittent diarrhea.   Patient has received cholecystectomy in the past. She admits almost everything she eats gives her diarrhea.   She previously followed with specialist in [place]. However, she would like new GI referral   Order placed for Colestipol 1 g po once daily   Will continue to follow   - Refer to [facility]; Future  - Colestipol (COLESTID) 1 gram Oral Tablet; Take 1 Tablet (1 g total) by mouth Once a day  Dispense: 30 Tablet; Refill: 3    8. Vitamin D deficiency  - VITAMIN D 25 TOTAL; Future    9. Obesity (BMI 30.0-34.9)  BMI 34.88 kg/m2      Identified Risk Factors/ Recommended Actions     Orders Placed This Encounter   . Urine Microalbumin/Creatinine Ratio, Random   . CBC/DIFF   . THYROID STIMULATING HORMONE (SENSITIVE TSH)   . Comp Metabolic Panel-Non Fasting   . Lipid Panel   . Vitamin B12   . VITAMIN D 25 TOTAL   . Hemoglobin A1C   . Urine Microalbumin/Creatinine Ratio, Random   . HGA1C (HEMOGLOBIN A1C WITH EST AVG GLUCOSE)   . Refer to [facility]   . POCT Hgb A1c   . tiZANidine (ZANAFLEX) 4 mg Oral Tablet   . Colestipol (COLESTID) 1 gram Oral Tablet        The patient has been educated about risk factors and recommended preventive care. Written Prevention Plan completed/ updated and given to patient (see After Visit Summary).  Medication reconciliation was performed during today's encounter.     Return in about 3 months (around 06/13/2022) for Check Up/Labs .      Samella Parr, DO  FAMILY MEDICINE, [facility]  [place]  [place] [address]  Phone: [phone]  Fax: [phone]

## 2022-03-13 NOTE — Telephone Encounter (Signed)
Called patient to schedule NPV, lvm with appointment date and time and a letter was mailed to them as well.

## 2022-03-13 NOTE — Nursing Note (Signed)
03/13/22 1026   Medicare Wellness Assessment   Medicare initial or wellness physical in the last year? Yes   Advance Directives   Does patient have a living will or MPOA no   Has patient provided [employer] with a copy? no   Advance directive information given to the patient today? no   Activities of Daily Living   Do you need help with dressing, bathing, or walking? No   Do you need help with shopping, housekeeping, medications, or finances? No   Do you have rugs in hallways, broken steps, or poor lighting? No   Do you have grab bars in your bathroom, non-slip strips in your tub, and hand rails on your stairs? No   Urinary Incontinence Screen-Women only   Do you ever leak urine when you don't want to? YES   Cognitive Function Screen   What is you age? 1   What is the time to the nearest hour? 1   What is the year? 1   What is the name of this clinic? 0   Can the patient recognize two persons (the doctor, the nurse, home help, etc.)? 1   What is the date of your birth? (day and month sufficient)  1   In what year did World War II end? 1   Who is the current president of the [place]? 1   Count from 20 down to 1? 1   What address did I give you earlier? 0   Total Score 8   Interpretation of Total Score Greater than 6 Normal   Depression Screen   Little interest or pleasure in doing things. 0   Feeling down, depressed, or hopeless 0   PHQ 2 Total 0   Pain Score   Pain Score Zero   Substance Use Screening   In Past 12 MONTHS, how often have you used any tobacco product (for example, cigarettes, e-cigarettes, cigars, pipes, or smokeless tobacco)? Never   In the PAST 12 MONTHS, how often have you had 5 (men)/4 (women) or more drinks containing alcohol in one day? Never   In the PAST 12 months, how often have you used any prescription medications just for the feeling, more than prescribed, or that were not prescribed for you? Prescriptions may include: opioids, benzodiazepines, medications for ADHD Never   In  the PAST 12 MONTHS, how often have you used any drugs, including marijuana, cocaine or crack, heroin, methamphetamine, hallucinogens, ecstasy/MDMA? Never   Hearing Screen   Have you noticed any hearing difficulties? Yes   After whispering 9-1-6 how many numbers did the patient repeat correctly? 3   Fall Risk Assessment   Do you feel unsteady when standing or walking? Yes   Do you worry about falling? Yes   Have you fallen in the past year? No   Timed up and go test (in seconds) 30   Vision Screen   Right Eye = 20 20   Left Eye = 20 20

## 2022-03-19 ENCOUNTER — Ambulatory Visit (INDEPENDENT_AMBULATORY_CARE_PROVIDER_SITE_OTHER): Payer: Self-pay | Admitting: Vascular & Interventional Radiology

## 2022-03-20 ENCOUNTER — Ambulatory Visit (INDEPENDENT_AMBULATORY_CARE_PROVIDER_SITE_OTHER): Payer: Self-pay | Admitting: Physician Assistant

## 2022-04-05 ENCOUNTER — Other Ambulatory Visit (INDEPENDENT_AMBULATORY_CARE_PROVIDER_SITE_OTHER): Payer: Self-pay | Admitting: Family Medicine

## 2022-04-05 DIAGNOSIS — K529 Noninfective gastroenteritis and colitis, unspecified: Secondary | ICD-10-CM

## 2022-04-09 ENCOUNTER — Other Ambulatory Visit: Payer: Self-pay

## 2022-04-09 ENCOUNTER — Telehealth (HOSPITAL_BASED_OUTPATIENT_CLINIC_OR_DEPARTMENT_OTHER): Payer: Self-pay | Admitting: NEPHROLOGY

## 2022-04-09 ENCOUNTER — Other Ambulatory Visit (HOSPITAL_COMMUNITY): Payer: Medicare PPO

## 2022-04-09 ENCOUNTER — Encounter (HOSPITAL_BASED_OUTPATIENT_CLINIC_OR_DEPARTMENT_OTHER): Payer: Self-pay | Admitting: NEPHROLOGY

## 2022-04-09 ENCOUNTER — Ambulatory Visit: Payer: Medicare PPO | Attending: Physician Assistant | Admitting: NEPHROLOGY

## 2022-04-09 DIAGNOSIS — N1832 Chronic kidney disease, stage 3b (CMS HCC): Secondary | ICD-10-CM

## 2022-04-09 DIAGNOSIS — J441 Chronic obstructive pulmonary disease with (acute) exacerbation: Secondary | ICD-10-CM

## 2022-04-09 LAB — RENAL FUNCTION PANEL
ALBUMIN: 3.8 g/dL (ref 3.4–4.8)
ANION GAP: 9 mmol/L (ref 4–13)
BUN/CREA RATIO: 14 (ref 6–22)
BUN: 20 mg/dL (ref 8–25)
CALCIUM: 9.5 mg/dL (ref 8.8–10.2)
CHLORIDE: 104 mmol/L (ref 96–111)
CO2 TOTAL: 27 mmol/L (ref 23–31)
CREATININE: 1.44 mg/dL — ABNORMAL HIGH (ref 0.60–1.05)
ESTIMATED GFR: 38 mL/min/BSA — ABNORMAL LOW (ref >=60)
GLUCOSE: 98 mg/dL (ref 65–125)
PHOSPHORUS: 2.7 mg/dL (ref 2.3–4.0)
POTASSIUM: 4.8 mmol/L (ref 3.5–5.1)
SODIUM: 140 mmol/L (ref 136–145)

## 2022-04-09 LAB — CBC WITH DIFF
BASOPHIL #: <0.1 10*3/uL (ref <=0.20)
BASOPHIL %: 1 %
EOSINOPHIL #: 0.41 10*3/uL (ref <=0.50)
EOSINOPHIL %: 4 %
HCT: 40.3 % (ref 34.8–46.0)
HGB: 13.1 g/dL (ref 11.5–16.0)
IMMATURE GRANULOCYTE #: <0.1 10*3/uL (ref <0.10)
IMMATURE GRANULOCYTE %: 0 % (ref 0–1)
LYMPHOCYTE #: 3.51 10*3/uL (ref 1.00–4.80)
LYMPHOCYTE %: 38 %
MCH: 32.4 pg — ABNORMAL HIGH (ref 26.0–32.0)
MCHC: 32.5 g/dL (ref 31.0–35.5)
MCV: 99.8 fL (ref 78.0–100.0)
MONOCYTE #: 0.84 10*3/uL (ref 0.20–1.10)
MONOCYTE %: 9 %
MPV: 11.2 fL (ref 8.7–12.5)
NEUTROPHIL #: 4.39 10*3/uL (ref 1.50–7.70)
NEUTROPHIL %: 48 %
PLATELETS: 219 10*3/uL (ref 150–400)
RBC: 4.04 10*6/uL (ref 3.85–5.22)
RDW-CV: 12.8 % (ref 11.5–15.5)
WBC: 9.2 10*3/uL (ref 3.7–11.0)

## 2022-04-09 LAB — URINALYSIS, MICROSCOPIC

## 2022-04-09 LAB — PARATHYROID HORMONE (PTH): PTH: 185.7 pg/mL — ABNORMAL HIGH (ref 8.5–77.0)

## 2022-04-09 LAB — URINALYSIS, MACROSCOPIC
BILIRUBIN: NEGATIVE mg/dL
BLOOD: NEGATIVE mg/dL
COLOR: NORMAL
GLUCOSE: NORMAL mg/dL
KETONES: NEGATIVE mg/dL
LEUKOCYTES: 75 WBCs/uL — AB
NITRITE: NEGATIVE
PH: 6 (ref 5.0–8.0)
PROTEIN: NEGATIVE mg/dL
SPECIFIC GRAVITY: 1.017 (ref 1.005–1.030)
UROBILINOGEN: NORMAL mg/dL

## 2022-04-09 LAB — MAGNESIUM: MAGNESIUM: 2.1 mg/dL (ref 1.8–2.6)

## 2022-04-09 LAB — MICROALBUMIN/CREATININE RATIO, URINE, RANDOM
CREATININE RANDOM URINE: 81 mg/dL (ref 50–100)
MICROALBUMIN RANDOM URINE: 1 mg/dL
MICROALBUMIN/CREATININE RATIO RANDOM URINE: 12.3 mg/g (ref <30.0)

## 2022-04-09 LAB — VITAMIN D 25 TOTAL: VITAMIN D 25, TOTAL: 41.5 ng/mL (ref 30.0–100.0)

## 2022-04-09 NOTE — Telephone Encounter (Signed)
Attempted to contact patient to notify them that their US Kidney is scheduled at the [facility] on 06/12/2022 at 11 am. No answer, left a voicemail requesting a return phone call. Appointment letter mailed. Larrie Kass, LPN  07/20/210, 15:24

## 2022-04-09 NOTE — H&P (Signed)
Nephrology [facility]  History and Physical      Date: [id number]  Name: Joanna Black  DOB: [id number]    Chief Complaint  CKD stage IIIB    HPI    DANAYA KITCHEL is a 76 y.o. female refer to nephrology for CKD stage IIIB management.  Per chart review patient has had a baseline serum creatinine of 1.3 to 1.5 mg/dL from 2020 to 2022.  Patient had recent elevation of serum creatinine of 1.79 to 1.67 mg/dL since beginning of the year.  Regards of etiology of patient's CKD patient has been diagnosed with diabetes mellitus type 2 for the past 5 years.  Patient denies any retinopathy or neuropathy.  She has also has a history of hypertension diagnosed 5 years ago however does not check her blood pressures at home.  Patient also has a history of CHF diagnosed in [place] 7 years ago currently followed by Cardiology most recent TTE on January 2023 shows EF 55-60%.  Patient also has a history of GERD was not PPI 3 years was switched H2 blocker 1 year ago.  Patient also has a prolonged history of chronic NSAID use for over 40 years.  Patient denies any dysuria, hematuria, weight loss, fevers, epistaxis, or hemoptysis.  Patient does not endorse shortness of breath with a history of COPD with emphysema.  Patient denies any family history of CKD or ESRD.        Past Medical History:   Diagnosis Date   . Arthritis    . Back problem    . BMI 35.0-35.9,adult 02/16/2020   . CAD (coronary artery disease)     mild   . Cataracts, bilateral    . CHF (congestive heart failure) (CMS HCC)    . Chronic bronchitis with emphysema (CMS HCC)    . Chronic low back pain    . Claustrophobia    . Depression    . Diabetes (CMS [place])    . Esophageal reflux    . Essential hypertension 01/28/2019   . Hypercholesterolemia    . Hypertension    . Hypertriglyceridemia    . Type 2 diabetes mellitus (CMS HCC)        Past Surgical History:   Procedure Laterality Date   . CATARACT EXTRACTION, BILATERAL Bilateral    . COLONOSCOPY     .  ESOPHAGOGASTRODUODENOSCOPY     . HX CARPAL TUNNEL RELEASE     . HX CATARACT REMOVAL Right 12/30/2019   . HX CATARACT REMOVAL Left 01/06/2020   . HX CHOLECYSTECTOMY     . HX EXPOSURE TO METAL SHAVINGS     . HX HEART CATHETERIZATION     . HX HYSTERECTOMY     . HX OOPHORECTOMY     . HX TAH AND BSO     . HX TUBAL LIGATION     . HX VEIN STRIPPING      Left leg           Current Outpatient Medications   Medication Sig   . acetaminophen (TYLENOL) 325 mg Oral Tablet Take 1 Tablet (325 mg total) by mouth Every night Takes 2 at night   . albuterol sulfate (PROVENTIL OR VENTOLIN OR PROAIR) 90 mcg/actuation Inhalation oral inhaler Take 1-2 Puffs by inhalation Every 6 hours as needed   . aspirin (ECOTRIN) 81 mg Oral Tablet, Delayed Release (E.C.) Take 1 Tablet (81 mg total) by mouth Once a day   . calcium carbonate/vitamin D3 (VITAMIN D-3 ORAL)  Take by mouth   . Colestipol (COLESTID) 1 gram Oral Tablet TAKE 1 TABLET (1 G TOTAL) BY MOUTH ONCE A DAY   . diclofenac sodium (VOLTAREN) 50 mg Oral Tablet, Delayed Release (E.C.) TAKE 1 TABLET BY MOUTH TWICE A DAY (Patient not taking: Reported on 04/09/2022)   . ezetimibe (ZETIA) 10 mg Oral Tablet TAKE 1 TABLET BY MOUTH EVERY DAY IN THE EVENING   . famotidine (PEPCID) 40 mg Oral Tablet TAKE 1 TABLET BY MOUTH EVERY DAY IN THE EVENING   . FLUoxetine (PROZAC) 20 mg Oral Capsule TAKE 1 CAPSULE BY MOUTH EVERY DAY   . Irbesartan (AVAPRO) 75 mg Oral Tablet TAKE 1 TABLET BY MOUTH EVERY DAY (STOP LOSARTAN/HCTZ)   . metoprolol succinate (TOPROL-XL) 25 mg Oral Tablet Sustained Release 24 hr TAKE 1 TABLET BY MOUTH EVERY DAY AT NIGHT   . tiZANidine (ZANAFLEX) 4 mg Oral Tablet TAKE 1 TABLET THREE TIMES A DAY AS NEEDED FOR MUSCLE CRAMPS Strength: 4 mg   . TRULICITY [id number] mL Subcutaneous Pen Injector INJECT 0.5 ML (0.75 MG) UNDER THE SKIN EVERY 7 DAYS       Family History  Family Medical History:     Problem Relation (Age of Onset)    Bone cancer Father    Diabetes Multiple family members     Hypertension (High Blood Pressure) Multiple family members    No Known Problems Mother    Stomach Cancer Paternal Grandmother          Social History  Social History     Tobacco Use   . Smoking status: Never     Passive exposure: Never   . Smokeless tobacco: Never   Substance Use Topics   . Alcohol use: No     Alcohol/week: 0.0 standard drinks        Review of Systems  As per HPI    Physical Exam  Vitals:    04/09/22 1128   BP: (!) 138/90   Pulse: 77   Temp: 36.3 C (97.3 F)   SpO2: 94%   Weight: 97.9 kg (215 lb 13.3 oz)   Height: 1.651 m (5\' 5" )   BMI: 35.99           General: No apparent distress  Head:  Normocephalic and atraumatic  Eyes:  EOMI, conjunctiva clear  Mouth:  Moist mucous membranes  Neck:  Supple, FROM  Lungs:  Bilateral wheezing  Heart:  RRR, no murmurs  Abdomen: Soft, non-tender to palpation + BS  Extremities: No cyanosis, no edema  Skin:  Warm and dry  Neurologic: No focal deficits, Alert  Psychiatric: Normal mood and affect       Labs   I have reviewed the labs     Imaging Studies  US kidney ordered     Assessment and Plan  Orders Placed This Encounter   . US KIDNEY   . CBC/DIFF   . RENAL FUNCTION PANEL   . MAGNESIUM   . URINALYSIS, MACROSCOPIC AND MICROSCOPIC   . MICROALBUMIN/CREATININE RATIO, URINE, RANDOM   . VITAMIN D 25 TOTAL   . PARATHYROID HORMONE (PTH)   . Refer to [facility] Pulmonology         CKD stage IIIB  - 2/2 hypertension, diabetes, obesity, chronic NSAID use.  - Baseline cr was previously 1.3-1.5 mg/dL until January of 2023 resume Grand rose to 1.79 to 1.67 mg/dL with gfr 29-32 ml/min/bsa  - Counseled on avoidance of NSAIDs and IV contrast.  If IV contrast  is needed for proper diagnosis recommend IV volume resuscitation prior to and following CT contrast if no other modality is able to be used.  - UA:  Ordered  - Renal imaging:  Ordered  - Goal A1c < 7, most recent A1c 5.9% on 03/13/2022  - Management of CKD is based upon continued diabetes management and hypertension management.   Avoidance of all NSAID products recommend using only acetaminophen for pain.  Further recommendations following repeat labs and further studies     Blood pressure  - goal < 120/80  - currently on irbesartan 75 mg daily and metoprolol succinate 25 mg daily  - advised patient to obtain blood pressure log at home and RTC with lock, will adjust medications as indicated  - I have discussed the proper technique for obtaining blood pressures at home as detailed below.   - When checking your blood pressure, please do the following:  - Sit upright in a chair with back supported in a quiet location for 5 minutes  - Have your legs uncrossed with your feet touching the floor  - No caffeine 30 minutes prior to checking your blood pressure  - Have your arm at approximately the same height as your heart resting on surface   - Keep a log/diary of your blood pressure readings to review with your doctor        Electrolytes  - stable with no derangement of Na or K     Acid/Base  - Stable with bicarb no indication for supplementation     Volume Status  - euvolemic with no need for diuretic therapy     H/H  - Will check CBC and then iron studies if indicated    Renal-BMD  - Ca, Mg, Phos, Vitamin d, iPTH: ordered     COPD with persistent dyspnea   - refer to pulmonology.      Follow up in 1-2 months    Gillermina Hu, MD, [place]   Dept. of Nephrology      On the day of the encounter, a total of  >60 minutes was spent on this patient encounter including review of historical information, examination, documentation and post-visit activities. The time documented excludes procedural time.      This note may have been partially generated using MModal Fluency Direct system, and there may be some incorrect words, spellings, and punctuation that were not noted in checking the note before saving

## 2022-04-09 NOTE — Patient Instructions (Signed)
When checking your blood pressure, please do the following:  - empty your bladder prior to blood pressure  - Sit upright in a chair with back supported in a quiet location for 5 minutes  - Have your legs uncrossed with your feet touching the floor  - No caffeine 30 minutes prior to checking your blood pressure  - Have your arm at approximately the same height as your heart resting on surface   - Keep a log/diary of your blood pressure readings to review with your doctor

## 2022-04-14 ENCOUNTER — Other Ambulatory Visit (INDEPENDENT_AMBULATORY_CARE_PROVIDER_SITE_OTHER): Payer: Self-pay | Admitting: Physician Assistant

## 2022-04-19 ENCOUNTER — Encounter (HOSPITAL_BASED_OUTPATIENT_CLINIC_OR_DEPARTMENT_OTHER): Payer: Self-pay | Admitting: Student in an Organized Health Care Education/Training Program

## 2022-04-19 ENCOUNTER — Other Ambulatory Visit (HOSPITAL_COMMUNITY): Payer: Medicare PPO

## 2022-04-19 ENCOUNTER — Other Ambulatory Visit: Payer: Self-pay

## 2022-04-19 ENCOUNTER — Ambulatory Visit
Payer: Medicare PPO | Attending: Student in an Organized Health Care Education/Training Program | Admitting: Student in an Organized Health Care Education/Training Program

## 2022-04-19 VITALS — BP 129/49 | HR 69 | Temp 97.0°F | Resp 16 | Ht 65.0 in | Wt 210.8 lb

## 2022-04-19 DIAGNOSIS — Z86010 Personal history of colonic polyps: Secondary | ICD-10-CM | POA: Insufficient documentation

## 2022-04-19 DIAGNOSIS — K5730 Diverticulosis of large intestine without perforation or abscess without bleeding: Secondary | ICD-10-CM

## 2022-04-19 DIAGNOSIS — Z9049 Acquired absence of other specified parts of digestive tract: Secondary | ICD-10-CM | POA: Insufficient documentation

## 2022-04-19 DIAGNOSIS — K222 Esophageal obstruction: Secondary | ICD-10-CM | POA: Insufficient documentation

## 2022-04-19 DIAGNOSIS — Z1211 Encounter for screening for malignant neoplasm of colon: Secondary | ICD-10-CM | POA: Insufficient documentation

## 2022-04-19 DIAGNOSIS — K529 Noninfective gastroenteritis and colitis, unspecified: Secondary | ICD-10-CM

## 2022-04-19 DIAGNOSIS — R1310 Dysphagia, unspecified: Secondary | ICD-10-CM | POA: Insufficient documentation

## 2022-04-19 DIAGNOSIS — K219 Gastro-esophageal reflux disease without esophagitis: Secondary | ICD-10-CM | POA: Insufficient documentation

## 2022-04-19 LAB — IMMUNOGLOBULIN A (IGA), SERUM: IMMUNOGLOBULIN A (IGA): 194 mg/dL (ref 69–517)

## 2022-04-19 MED ORDER — CHOLESTYRAMINE-ASPARTAME 4 GRAM ORAL POWDER FOR SUSP IN A PACKET
4.0000 g | Freq: Three times a day (TID) | ORAL | 3 refills | Status: DC
Start: 2022-04-19 — End: 2022-06-04

## 2022-04-19 MED ORDER — PEG 3350-ELECTROLYTES 236 GRAM-22.74 GRAM-6.74 GRAM-5.86 GRAM SOLUTION
4.0000 L | Freq: Once | ORAL | 0 refills | Status: AC
Start: 2022-04-19 — End: 2022-04-19

## 2022-04-19 MED ORDER — BISACODYL 5 MG TABLET
4.0000 | ORAL_TABLET | Freq: Once | ORAL | 0 refills | Status: AC
Start: 2022-04-19 — End: 2022-04-19

## 2022-04-19 NOTE — Progress Notes (Signed)
[facility]                                                      Department of Gastroenterology  [address]  [place], [place] [address]    Date:   04/19/2022  Name: Joanna Black  Age: 76 y.o.    Referring Provider:    Genene Churn, DO  [address]  [place],  [place] [address]    Primary Care Provider:    Genene Churn, DO    Chief Complaint:  History of esophageal stricture, worsening dysphagia, GERD, history of colon polyps    History of Present Illness  The history is obtained from the patient and chart reveiw  Joanna Black is a pleasant 76 y.o. female with past medical history of cholecystectomy, colon polyps, chronic diarrhea, gastroesophageal reflux disease, esophageal stricture with dilatation, coronary artery disease, hypertension, hyperlipidemia, arthritis, obesity, diverticulosis      Clinic visit  04-19-2022   =================  Patient is here for new visit   Patient complaining of worsening dysphagia, she has history of esophageal stricture with previous dilatation and [facility], she states that she did not have dilatation to the maximum because unavailability of larger sized balloons   She is unable to tolerate liquids or solids recently, everything gets stuck in the chest  She has history of colon polyps, colonoscopy long time ago, no report available, due for surveillance  No GI cancer in family    She has cholecystectomy before, chronic diarrhea mainly after eating, previously treated with colestipol with partial improvement  She has heartburn, taking famotidine, her PCP switch PPI to famotidine due to concern for side effects, she has worsening symptoms and indigestion  No NSAIDs use  No smoking or drinking    The patient denies any abdominal pain, nausea, vomiting, abdominal bloating, constipation,  rectal bleeding, melena, hematemesis, unintentional weight loss or other complaints.        Review of Systems  See HPI.  No chest pain or sob  No fever or chills     Past  Medical History: She  has a past medical history of Arthritis, Back problem, BMI 35.0-35.9,adult (02/16/2020), CAD (coronary artery disease), Cataracts, bilateral, CHF (congestive heart failure) (CMS HCC), Chronic bronchitis with emphysema (CMS HCC), Chronic low back pain, Claustrophobia, Depression, Diabetes (CMS HCC), Esophageal reflux, Essential hypertension (01/28/2019), Hypercholesterolemia, Hypertension, Hypertriglyceridemia, and Type 2 diabetes mellitus (CMS HCC).    She has no past medical history of Breast CA (CMS HCC), Cancer (CMS HCC), Cervical cancer (CMS HCC), Colon cancer (CMS HCC), Endometrial cancer (CMS HCC), Ovarian cancer (CMS HCC), Treatment, or Uterine cancer (CMS HCC).      Past Surgical History: She  has a past surgical history that includes hx hysterectomy; hx carpal tunnel release; hx tubal ligation; hx gall bladder surgery/chole; hx vein stripping; hx exposure to metal shavings; Esophagogastroduodenoscopy; colonoscopy; hx tah and bso; hx heart catheterization; Cataract extraction, bilateral (Bilateral); hx cataract removal (Right, 12/30/2019); hx cataract removal (Left, 01/06/2020); and hx oophorectomy.    Social History: Her  reports that she has never smoked. She has never been exposed to tobacco smoke. She has never used smokeless tobacco. She reports that she does not drink alcohol and does not use drugs., ,     Family History: She family history  includes Bone cancer in her father; Diabetes in her multiple family members; Hypertension (High Blood Pressure) in her multiple family members; No Known Problems in her mother; Stomach Cancer in her paternal grandmother.    Allergies: She is allergic to amoxicillin, augmentin [amoxicillin-pot clavulanate], and statins-hmg-coa reductase inhibitors.    Home Medication:   Current Outpatient Medications   Medication Sig   . acetaminophen (TYLENOL) 325 mg Oral Tablet Take 1 Tablet (325 mg total) by mouth Every night Takes 2 at night   . albuterol  sulfate (PROVENTIL OR VENTOLIN OR PROAIR) 90 mcg/actuation Inhalation oral inhaler Take 1-2 Puffs by inhalation Every 6 hours as needed   . aspirin (ECOTRIN) 81 mg Oral Tablet, Delayed Release (E.C.) Take 1 Tablet (81 mg total) by mouth Once a day   . calcium carbonate/vitamin D3 (VITAMIN D-3 ORAL) Take by mouth   . Colestipol (COLESTID) 1 gram Oral Tablet TAKE 1 TABLET (1 G TOTAL) BY MOUTH ONCE A DAY   . diclofenac sodium (VOLTAREN) 50 mg Oral Tablet, Delayed Release (E.C.) TAKE 1 TABLET BY MOUTH TWICE A DAY   . ezetimibe (ZETIA) 10 mg Oral Tablet TAKE 1 TABLET BY MOUTH EVERY DAY IN THE EVENING   . famotidine (PEPCID) 40 mg Oral Tablet TAKE 1 TABLET BY MOUTH EVERY DAY IN THE EVENING   . FLUoxetine (PROZAC) 20 mg Oral Capsule TAKE 1 CAPSULE BY MOUTH EVERY DAY   . Irbesartan (AVAPRO) 75 mg Oral Tablet TAKE 1 TABLET BY MOUTH EVERY DAY (STOP LOSARTAN/HCTZ)   . metoprolol succinate (TOPROL-XL) 25 mg Oral Tablet Sustained Release 24 hr TAKE 1 TABLET BY MOUTH EVERY DAY AT NIGHT   . tiZANidine (ZANAFLEX) 4 mg Oral Tablet TAKE 1 TABLET THREE TIMES A DAY AS NEEDED FOR MUSCLE CRAMPS Strength: 4 mg   . TRULICITY 0.75 mg/0.5 mL Subcutaneous Pen Injector INJECT 0.5 ML (0.75 MG) UNDER  THE SKIN EVERY 7 DAYS       Examination:  BP (!) 129/49   Pulse 69   Temp 36.1 C (97 F) (Temporal)   Resp 16   Ht 1.651 m (5\' 5" )   Wt 95.6 kg (210 lb 12.2 oz)   SpO2 97%   BMI 35.07 kg/m        Wt Readings from Last 3 Encounters:   04/19/22 95.6 kg (210 lb 12.2 oz)   04/09/22 97.9 kg (215 lb 13.3 oz)   03/13/22 95.1 kg (209 lb 9.6 oz)       General: Resting comfortably, no acute distress.  Eyes: Conjunctiva normal, sclera non-icteric.  HENT: Atraumatic and normocephalic.   Neck:  Supple.   Lungs: Breathing comfortably.  No respiratory distress.  Abdomen: soft lax, not tender   Extremities: No cyanosis or edema  Neurologic: Alert, Oriented, Grossly normal  Psychiatric: Appears normal    Data/Chart/Labs/Imaging reviewed:  -CBC in April  2023 with WBC 9.2, hemoglobin 13.1, platelet 219  -BMP in April 2023 with creatinine 1.44, otherwise unremarkable  -liver panel in December 2022 normal    -vitamin-D in April 2023 was 41.5  -A1c in March 2023 was 5.9  -EGD in 2020 showed esophageal stricture, dilated distal middle and proximal esophagus, gastritis, lipoma of the 2nd part of duodenum, no pathology available, report not mentionening  the size of dilatation  -colonoscopy in 2018 with diverticulosis, biopsy taken to rule out microscopic colitis, no pathology available  _________________________________________________________________________  Assessment and Plan  Joanna Black is a 76 y.o. female with the following issues:  1. Worsening dysphagia to solid and liquid  2. History of esophageal stricture status post dilatation in [place]  3. Gastroesophageal reflux disease, worsening symptoms after switching PPI to famotidine by PCP according to the patient  4. History of colon polyps long time ago, due for surveillance  5. Postprandial diarrhea, history of cholecystectomy, possible bile salt diarrhea, partial improvement with colestipol  6. Colonic diverticulosis without complication    My recommendations are as follows:    -I personally reviewed previous GI workup/procedures/labs and relevant imaging as stated above  -will order barium esophagram  -will schedule EGD with possible dilatation  -will schedule colonoscopy for surveillance  -will screen for celiac disease, tTG IgA, total IgA level  -check stool for C diff  -will switch colestipol to cholestyramine 4 g t.i.d. as needed with meals for possible bile salt  --Avoid NSAIDs  -Anti-reflux measures discussed with patient.   -Avoid triggering foods  -Elevate head of bed at night or use more pillows  -Avoid eating 2-3 hours prior to bedtime   -Will decide about switching back to PPI after EGD  -continue famotidine b.i.d. in the meantime  -Recommend weight loss of at least 10 % of  current body weight   --Small bites, Small sips,  Swallow each bite/sip before taking next swallow and Remain upright after meals for  30 minutes after eating, slow rate of intake  -Recommend to take his medication with  puree or pudding consistency if having difficulty swallowing medications     --Recommended to eat a high-fiber diet with fresh fruits and vegetables.  Suggested to increase fluid intake and to drink more water when eating more fiber.  Also suggested regular exercise.  -recommend Metamucil BID              Discussed with patient the consent for EGD/Colonoscopy. Alternative to endoscopy were also discussed. The risks/benefits and complications including but not limited to anesthesia, bleeding, dental injury, aspiration, CV/pulmonary risk, perforation, infection, missed lesions, need for surgery & remote possibility of death were discussed with patient who understand and agreed to proceed.       The patient was given the opportunity to ask questions and those questions were appropriately answered. He agreed with the treatment plan and is encouraged to call with any additional questions or concerns.     Return to the clinic in 6 months    Joslyn Devon, MD   [facility] - Gastroenterology  [place], [place]  [address]    Note: A portion of this documentation was generated using MMODAL (voice recognition software) and may contain syntax/voice recognition errors.

## 2022-04-21 ENCOUNTER — Encounter (HOSPITAL_BASED_OUTPATIENT_CLINIC_OR_DEPARTMENT_OTHER): Payer: Self-pay | Admitting: Student in an Organized Health Care Education/Training Program

## 2022-04-21 DIAGNOSIS — K222 Esophageal obstruction: Secondary | ICD-10-CM | POA: Insufficient documentation

## 2022-04-21 DIAGNOSIS — K219 Gastro-esophageal reflux disease without esophagitis: Secondary | ICD-10-CM | POA: Insufficient documentation

## 2022-04-21 DIAGNOSIS — K529 Noninfective gastroenteritis and colitis, unspecified: Secondary | ICD-10-CM | POA: Insufficient documentation

## 2022-04-21 DIAGNOSIS — Z1211 Encounter for screening for malignant neoplasm of colon: Secondary | ICD-10-CM | POA: Insufficient documentation

## 2022-04-21 DIAGNOSIS — Z86010 Personal history of colonic polyps: Secondary | ICD-10-CM | POA: Insufficient documentation

## 2022-04-21 DIAGNOSIS — R1310 Dysphagia, unspecified: Secondary | ICD-10-CM | POA: Insufficient documentation

## 2022-04-21 DIAGNOSIS — K5730 Diverticulosis of large intestine without perforation or abscess without bleeding: Secondary | ICD-10-CM | POA: Insufficient documentation

## 2022-04-21 DIAGNOSIS — Z9049 Acquired absence of other specified parts of digestive tract: Secondary | ICD-10-CM | POA: Insufficient documentation

## 2022-04-22 LAB — TISSUE TRANSGLUTAMINASE (TTG) ANTIBODY, IGA, SERUM
TISSUE TRANSGLUTAMINASE ANTIBODIES IGA QUALITATIVE: NEGATIVE
TISSUE TRANSGLUTAMINASE ANTIBODIES IGA QUANTITATIVE: <0.5 U/mL (ref <15.0)

## 2022-04-24 ENCOUNTER — Telehealth (HOSPITAL_BASED_OUTPATIENT_CLINIC_OR_DEPARTMENT_OTHER): Payer: Self-pay | Admitting: Student in an Organized Health Care Education/Training Program

## 2022-04-24 ENCOUNTER — Encounter (HOSPITAL_BASED_OUTPATIENT_CLINIC_OR_DEPARTMENT_OTHER): Payer: Self-pay | Admitting: Student in an Organized Health Care Education/Training Program

## 2022-04-24 NOTE — Telephone Encounter (Signed)
Dr. Abdel-Aziz ordered an EGD for pt.    Pt is sched on 05/07/2022.  Pt must be NPO after midnight.     -If they have any questions, they may feel free to contact us to discuss any questions they may have.    -The day before procedure, PAT will call to confirm the exact time.  Times may change pending on the sx sched that day.    -Day of procedure pt aware they need to arrive  1.25 hours early on 2nd floor OPS with a driver.    -Pt will not be able to drive for 24 hrs.    -Doc will speak to the pt before and after procedure.    -Clearance needed:   No    Instructions/education mailed: yes  Address confirmed in chart:  No    -Additional comments:

## 2022-04-25 ENCOUNTER — Other Ambulatory Visit: Payer: Self-pay

## 2022-04-25 ENCOUNTER — Encounter (HOSPITAL_BASED_OUTPATIENT_CLINIC_OR_DEPARTMENT_OTHER): Payer: Self-pay | Admitting: Student in an Organized Health Care Education/Training Program

## 2022-04-25 ENCOUNTER — Inpatient Hospital Stay
Admission: RE | Admit: 2022-04-25 | Discharge: 2022-04-25 | Disposition: A | Discharge status: Home / Self Care | Payer: Medicare PPO | Source: Ambulatory Visit | Attending: Student in an Organized Health Care Education/Training Program | Admitting: Student in an Organized Health Care Education/Training Program

## 2022-04-25 DIAGNOSIS — K222 Esophageal obstruction: Secondary | ICD-10-CM | POA: Insufficient documentation

## 2022-04-25 MED ORDER — BARIUM SULFATE 96 % (W/W) ORAL POWDER FOR SUSPENSION
176.0000 g | INHALATION_SUSPENSION | ORAL | Status: AC
Start: 2022-04-25 — End: 2022-04-25
  Administered 2022-04-25: 176 g via ORAL

## 2022-04-25 MED ORDER — SOD BICARB-CITRIC AC-SIMETH 2.21 GRAM-1.53 GRAM/4 GRAM GRANULES EFFERV
1.0000 | GRANULES | ORAL | Status: AC
Start: 2022-04-25 — End: 2022-04-25
  Administered 2022-04-25: 1 via ORAL

## 2022-04-25 MED ORDER — BARIUM SULFATE 98 % ORAL POWDER FOR SUSPENSION
140.0000 mL | INHALATION_SUSPENSION | ORAL | Status: AC
Start: 2022-04-25 — End: 2022-04-25
  Administered 2022-04-25: 140 mL via ORAL

## 2022-05-01 ENCOUNTER — Encounter (HOSPITAL_BASED_OUTPATIENT_CLINIC_OR_DEPARTMENT_OTHER): Payer: Self-pay | Admitting: Student in an Organized Health Care Education/Training Program

## 2022-05-02 ENCOUNTER — Telehealth (HOSPITAL_BASED_OUTPATIENT_CLINIC_OR_DEPARTMENT_OTHER): Payer: Self-pay

## 2022-05-02 NOTE — Telephone Encounter (Addendum)
Attempted to call patient to give below results.  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  Patient is scheduled for EGD May 16th    I will see Ms.Haire in the clinic again as previously scheduled to further discuss about the results in-detail. I will formulate further treatment plan at the time.     Please let me know if Ms.Duford has additional questions.     Thanks!    Joslyn Devon, M.D.  [facility] - Gastroenterology  [place], [place]  [address]

## 2022-05-06 ENCOUNTER — Encounter (HOSPITAL_COMMUNITY): Payer: Self-pay | Admitting: Student in an Organized Health Care Education/Training Program

## 2022-05-06 NOTE — Nursing Note (Signed)
COVID-19 Admission Screen    Low Risk:  None    Moderate/High Risk: {None    Test Result:Not Indicated

## 2022-05-07 ENCOUNTER — Ambulatory Visit (HOSPITAL_COMMUNITY): Payer: Medicare PPO | Admitting: Certified Registered"

## 2022-05-07 ENCOUNTER — Encounter (HOSPITAL_COMMUNITY)
Admission: RE | Disposition: A | Discharge status: Home / Self Care | Payer: Self-pay | Source: Ambulatory Visit | Attending: Student in an Organized Health Care Education/Training Program

## 2022-05-07 ENCOUNTER — Inpatient Hospital Stay
Admission: RE | Admit: 2022-05-07 | Discharge: 2022-05-07 | Disposition: A | Discharge status: Home / Self Care | Payer: Medicare PPO | Source: Ambulatory Visit | Attending: Student in an Organized Health Care Education/Training Program | Admitting: Student in an Organized Health Care Education/Training Program

## 2022-05-07 ENCOUNTER — Other Ambulatory Visit: Payer: Self-pay

## 2022-05-07 ENCOUNTER — Encounter (HOSPITAL_COMMUNITY): Payer: Self-pay | Admitting: Student in an Organized Health Care Education/Training Program

## 2022-05-07 ENCOUNTER — Other Ambulatory Visit (HOSPITAL_BASED_OUTPATIENT_CLINIC_OR_DEPARTMENT_OTHER): Payer: Self-pay | Admitting: Student in an Organized Health Care Education/Training Program

## 2022-05-07 ENCOUNTER — Ambulatory Visit (HOSPITAL_BASED_OUTPATIENT_CLINIC_OR_DEPARTMENT_OTHER): Payer: Medicare PPO | Admitting: Certified Registered"

## 2022-05-07 DIAGNOSIS — K2950 Unspecified chronic gastritis without bleeding: Secondary | ICD-10-CM | POA: Insufficient documentation

## 2022-05-07 DIAGNOSIS — Q398 Other congenital malformations of esophagus: Secondary | ICD-10-CM | POA: Insufficient documentation

## 2022-05-07 DIAGNOSIS — K222 Esophageal obstruction: Secondary | ICD-10-CM

## 2022-05-07 DIAGNOSIS — K449 Diaphragmatic hernia without obstruction or gangrene: Secondary | ICD-10-CM | POA: Insufficient documentation

## 2022-05-07 DIAGNOSIS — R1310 Dysphagia, unspecified: Secondary | ICD-10-CM

## 2022-05-07 DIAGNOSIS — K319 Disease of stomach and duodenum, unspecified: Secondary | ICD-10-CM

## 2022-05-07 DIAGNOSIS — K3189 Other diseases of stomach and duodenum: Secondary | ICD-10-CM | POA: Insufficient documentation

## 2022-05-07 LAB — POC BLOOD GLUCOSE (RESULTS): GLUCOSE, POC: 117 mg/dl — ABNORMAL HIGH (ref 70–110)

## 2022-05-07 SURGERY — GASTROSCOPY WITH DILATION
Anesthesia: General | Site: Mouth | Laterality: Left | Wound class: Clean Contaminated Wounds-The respiratory, GI, Genital, or urinary

## 2022-05-07 MED ORDER — PROPOFOL 10 MG/ML IV BOLUS
INJECTION | Freq: Once | INTRAVENOUS | Status: DC | PRN
Start: 2022-05-07 — End: 2022-05-07
  Administered 2022-05-07 (×6): 50 mg via INTRAVENOUS

## 2022-05-07 MED ORDER — LACTATED RINGERS INTRAVENOUS SOLUTION
INTRAVENOUS | Status: DC
Start: 2022-05-07 — End: 2022-05-07

## 2022-05-07 MED ORDER — SODIUM CHLORIDE 0.9 % (FLUSH) INJECTION SYRINGE
3.0000 mL | INJECTION | INTRAMUSCULAR | Status: DC | PRN
Start: 2022-05-07 — End: 2022-05-07

## 2022-05-07 MED ORDER — SIMETHICONE 40 MG/0.6 ML ORAL DROPS,SUSPENSION
Freq: Once | ORAL | Status: DC | PRN
Start: 2022-05-07 — End: 2022-05-07
  Administered 2022-05-07: 40 mg

## 2022-05-07 MED ORDER — SODIUM CHLORIDE 0.9 % (FLUSH) INJECTION SYRINGE
3.0000 mL | INJECTION | Freq: Three times a day (TID) | INTRAMUSCULAR | Status: DC
Start: 2022-05-07 — End: 2022-05-07

## 2022-05-07 MED ORDER — LIDOCAINE (PF) 100 MG/5 ML (2 %) INTRAVENOUS SYRINGE
INJECTION | Freq: Once | INTRAVENOUS | Status: DC | PRN
Start: 2022-05-07 — End: 2022-05-07
  Administered 2022-05-07: 100 mg via INTRAVENOUS

## 2022-05-07 SURGICAL SUPPLY — 35 items
BITE BLOCK 60FR MAJ1632_50EA/BX (INSTRUMENTS ENDOMECHANICAL) ×1
BLOCK BITE 60FR ADULT STRAP FLXB SH LF  DISP (ENDOSCOPIC SUPPLIES) ×1 IMPLANT
CATH CRE 7.5FR 15-18MM 5.5CM 240CM WG BAL DIL BIL ESOPH PYL STRL LF  2.8MM 3.7MM WRK CHNL F/G (ENDOSCOPIC SUPPLIES) ×1 IMPLANT
CATH ELHMST INJ GLD PRB 7FR 25_GA .24MM 210CM BIPO RND DIST (INSTRUMENTS ENDOMECHANICAL)
CATH ELHMST INJ GLD PROBE 7FR 25GA .24MM 210CM BIPOLAR RND DIST TIP STD CONN INTGR DISP 2.8MM MN WRK (ENDOSCOPIC SUPPLIES) IMPLANT
CLIP HMST MR CONDITIONAL BRD CATH ROT CONTROL KNOB NO SHEATH RSL 360 235CM 2.8MM 11MM OPN (ENDOSCOPIC SUPPLIES) IMPLANT
DEVICE HEMOSTASIS CLIP 360_235CM RESOLUTION (INSTRUMENTS ENDOMECHANICAL)
DILATOR ENDOS CRE 180CM 5.5CM 12-13.5-15MM 7.5FR ESOPH PYL COLON BAL LOW PROF GW PEBAX STRL LF  DISP (ENDOSCOPIC SUPPLIES) ×1 IMPLANT
DILATOR ENDOS CRE 180CM 5.5CM_12-13.5-15MM 7.5FR ESOPH PYL (INSTRUMENTS ENDOMECHANICAL) ×1
DILATOR ENDOS CRE 180CM 5.5CM_15-16.5-18MM 7.5FR ESOPH PYL (INSTRUMENTS ENDOMECHANICAL) ×1
FORCEPS BIOPSY HOT 240CM 2.2MM RJ 4 +2.8MM DISP (ENDOSCOPIC SUPPLIES)
FORCEPS BIOPSY HOT 240CM 2.2MM RJ 4 +2.8MM DISPO (ENDOSCOPIC SUPPLIES) IMPLANT
FORCEPS BIOPSY HOT 240CM 2.2MM_RJ 4 +2.8MM DISP (INSTRUMENTS ENDOMECHANICAL)
FORCEPS BIOPSY LRG CPC NEEDLE 240CM 2.2MM RJ 3 DISP ORNG (ENDOSCOPIC SUPPLIES) ×1 IMPLANT
FORCEPS BIOPSY NEEDLE 160CM 1.8MM RJ 4 DISP YW 2MM WRK CHNL GSPED (MED SURG SUPPLIES) IMPLANT
FORCEPS BIOPSY NEEDLE 160CM 1._8MM RJ 4 PED 2+ MM DISP (MED SURG SUPPLIES)
FORCEPS BIOPSY NEEDLE 240CM RJ 4 JMB (ENDOSCOPIC SUPPLIES) IMPLANT
FORCEPS BIOPSY NEEDLE 240CM RJ_4 JMB DISP (INSTRUMENTS ENDOMECHANICAL)
FORCEPS BIOPSY NEEDLE 240CM RJ_4 LRG CPC DISP (INSTRUMENTS ENDOMECHANICAL) ×1
GW ENDOSCOPIC .035IN 260CM DREAMWIRE ANG RX EGLD NITINOL BIL STRL DISP (ENDOSCOPIC SUPPLIES) IMPLANT
LIGATOR 2.8MM 8.6-11.5MM SSS7 ESOPH 1 STNG MLT BAND HNDL STRL DISP ENDOS HMSTS LF (ENDOSCOPIC SUPPLIES) IMPLANT
LIGATOR 2.8MM 8.6-11.5MM SSS7_ESOPH 1 STNG MLT BAND ERG HNDL (INSTRUMENTS ENDOMECHANICAL)
NEEDLE SCLRTX 25GA 2.3MM BVL STRL DISP STAR CATH INTJCT 4MM 240CM (ENDOSCOPIC SUPPLIES) IMPLANT
NEEDLE SCLRTX 25GA 2.3MM BVL S_TRL DISP STAR CATH INTJCT 4MM (INSTRUMENTS ENDOMECHANICAL)
NET SPEC RETR 230CM 2.5MM RTHNT STD SHEATH 6X3CM NONST LF  DISP (ENDOSCOPIC SUPPLIES) IMPLANT
NET SPEC RETR 230CM 2.5MM RTHN_T STD SHTH 6X3CM NONST LF (INSTRUMENTS ENDOMECHANICAL)
PROBE ESURG 220CM 2.3MM FIAPC FLXB STR FIRE STRL DISP (SURGICAL CUTTING SUPPLIES) IMPLANT
PROBE ESURG 220CM 2.3MM FIAPC_FLXB STR FIRE ARGON PLAS COAG (CUTTING ELEMENTS)
SNARE 230CM 2.5MM 3CM PLPK PLP_CTM OVL ENDOS 6CM STRL DISP (INSTRUMENTS ENDOMECHANICAL)
SNARE 230CM 2.5MM 3CM PLPK ROTR RTHNT ENDOS NONST LF  DISP (ENDOSCOPIC SUPPLIES) IMPLANT
SNARE LRG OVL MED STF ENDOS OL_MPS DISP (INSTRUMENTS ENDOMECHANICAL)
SNARE MED OVAL 240CM 2.4MM CAPTIVATR STF ENDOS PLYP 27MM DISP (ENDOSCOPIC SUPPLIES) IMPLANT
SNARE MED OVL 240CM 2.4MM CAPT_IVATR STF ENDOS PLYP 27MM DISP (INSTRUMENTS ENDOMECHANICAL)
SYRINGE INFLAT ALN II GA STRL DISP 60ML (ENDOSCOPIC SUPPLIES) ×1 IMPLANT
SYRINGE INFLAT ALN II GA STRL_DISP 60ML (INSTRUMENTS ENDOMECHANICAL) ×1

## 2022-05-07 NOTE — Anesthesia Preprocedure Evaluation (Signed)
ANESTHESIA PRE-OP EVALUATION  Planned Procedure: GASTROSCOPY WITH DILATION (Left: Mouth)  Review of Systems                   Pulmonary   COPD,   Cardiovascular    Hypertension, CAD and CHF ,No peripheral edema,        GI/Hepatic/Renal    GERD        Endo/Other      type 2 diabetes    Neuro/Psych/MS    back abnormality, depression     Cancer                      Physical Assessment      Airway       Mallampati: II    TM distance: >3 FB    Neck ROM: full  Mouth Opening: good.  No Facial hair  No Beard        Dental           (+) partials           Pulmonary      (+) decreased breath sounds present        Cardiovascular    Rhythm: regular  Rate: Normal  (-) no friction rub, carotid bruit is not present, no peripheral edema and no murmur     Other findings            Plan  ASA 3     Planned anesthesia type: general     total intravenous anesthesia                  Intravenous induction     Anesthesia issues/risks discussed are: PONV, Dental Injuries, Intraoperative Awareness/ Recall, Aspiration and Sore Throat.  Anesthetic plan and risks discussed with patient  Signed consent obtained            Patient's NPO status is appropriate for Anesthesia.           Plan discussed with CRNA.

## 2022-05-07 NOTE — Anesthesia Postprocedure Evaluation (Signed)
Anesthesia Post Op Evaluation    Patient: Joanna Black  Procedure(s):  GASTROSCOPY WITH DILATION and bxs    Last Vitals:Temperature: 36.3 C (97.3 F) (05/07/22 1301)  Heart Rate: 61 (05/07/22 1301)  BP (Non-Invasive): 124/66 (05/07/22 1301)  Respiratory Rate: 17 (05/07/22 1301)  SpO2: 98 % (05/07/22 1301)    No notable events documented.    Patient is sufficiently recovered from the effects of anesthesia to participate in the evaluation and has returned to their pre-procedure level.  Patient location during evaluation: bedside       Patient participation: complete - patient participated  Level of consciousness: awake and alert and responsive to verbal stimuli    Pain score: 0  Pain management: adequate  Airway patency: patent    Anesthetic complications: no  Cardiovascular status: acceptable  Respiratory status: acceptable  Hydration status: acceptable  Patient post-procedure temperature: Pt Normothermic   PONV Status: Absent

## 2022-05-07 NOTE — OR PreOp (Signed)
COVID-19 Admission Screen    Low Risk:  None    Moderate/High Risk: {None    Test Result:Not Indicated

## 2022-05-07 NOTE — Discharge Instructions (Signed)
Provation MD instruction sheets for after EGD reviewed and provided to patient and family.

## 2022-05-07 NOTE — H&P (Signed)
[facility]  Operating Room H&P/Update    Name: Joanna Black  Age: 76 y.o.    Primary Care Provider:  Genene Churn, DO    HPI:    Joanna Black is a pleasent 76 y.o. female with past medical history as mentioned below is in the endoscopic unit for further evaluation of  History of esophageal stricture, worsening dysphagia    she has history of esophageal stricture with previous dilatation and [place], she states that she did not have dilatation to the maximum because unavailability of larger sized balloons     Patient is planned for EGD today as previously recommended. There is no change in interval history from prior visit.     Review of Systems:  Constitutional: Denies fevers, chills  Cardiovascular: Denies chest pain  Neurological: Denies seizures    Past Medical History  Past Medical History:   Diagnosis Date   . Arthritis    . Back problem    . BMI 35.0-35.9,adult 02/16/2020   . CAD (coronary artery disease)     mild   . Cataracts, bilateral    . CHF (congestive heart failure) (CMS HCC)    . Chronic bronchitis with emphysema (CMS HCC)    . Chronic low back pain    . Claustrophobia    . Depression    . Diabetes (CMS HCC)    . Esophageal reflux    . Essential hypertension 01/28/2019   . Hypercholesterolemia    . Hypertension    . Hypertriglyceridemia    . Type 2 diabetes mellitus (CMS HCC)          Past Surgical History:   Procedure Laterality Date   . CATARACT EXTRACTION, BILATERAL Bilateral    . COLONOSCOPY     . ESOPHAGOGASTRODUODENOSCOPY     . HX CARPAL TUNNEL RELEASE     . HX CATARACT REMOVAL Right 12/30/2019   . HX CATARACT REMOVAL Left 01/06/2020   . HX CHOLECYSTECTOMY     . HX EXPOSURE TO METAL SHAVINGS     . HX HEART CATHETERIZATION     . HX HYSTERECTOMY     . HX OOPHORECTOMY     . HX TAH AND BSO     . HX TUBAL LIGATION     . HX VEIN STRIPPING      Left leg         Family Medical History:     Problem Relation (Age of Onset)    Bone cancer Father    Diabetes Multiple family  members    Hypertension (High Blood Pressure) Multiple family members    No Known Problems Mother    Stomach Cancer Paternal Grandmother          Social History     Socioeconomic History   . Marital status: Married     Spouse name: Duke Salvia   . Number of children: 3   . Years of education: 37   . Highest education level: High school graduate   Occupational History   . Occupation: Retired   Tobacco Use   . Smoking status: Never     Passive exposure: Never   . Smokeless tobacco: Never   Vaping Use   . Vaping Use: Never used   Substance and Sexual Activity   . Alcohol use: No     Alcohol/week: 0.0 standard drinks   . Drug use: No   . Sexual activity: Not Currently     Partners: Male  Other Topics Concern   . Right hand dominant Yes   . Ability to Walk 1 Flight of Steps without SOB/CP No   . Ability To Do Own ADL's Yes   Social History Narrative    MARRIED.  Lives in single story home.  Heats home with electric and has well water.        April Haney, LPN  1/61/0960, 14:01     @MEDSTAKING @  Allergies   Allergen Reactions   . Amoxicillin    . Augmentin [Amoxicillin-Pot Clavulanate] Nausea/ Vomiting   . Statins-Hmg-Coa Reductase Inhibitors Myalgia       Examination:  BP 124/66   Pulse 61   Temp 36.3 C (97.3 F)   Resp 17   Ht 1.549 m (5\' 1" )   Wt 94.3 kg (208 lb)   SpO2 98%   BMI 39.30 kg/m        Wt Readings from Last 2 Encounters:   05/07/22 94.3 kg (208 lb)   04/19/22 95.6 kg (210 lb 12.2 oz)     Head: Atraumatic and normocephalic  Eyes: Conjunctiva clear, sclera non-icteric  Neck:  Supple  Lungs: Normal reparatory effort bilaterally  Cardiovascular: Regular rate  Neurologic: Alert,  Grossly normal  Psychiatric: Appears normal    CBC Results Coags Results   No results for input(s): WBC, HGB, HCT, PLTCNT, BANDS in the last 72 hours.    Invalid input(s): PLATELETCOUNT No results for input(s): INR, PROTHROMTME, APTT in the last 72 hours.    Invalid input(s): PTT, PT, CREACTPROTIE   BMP Results Other Chemistries  Results   No results for input(s): SODIUM, POTASSIUM, CHLORIDE, CO2, BUN, CREATININE, GFR, ANIONGAP in the last 72 hours. No results for input(s): CALCIUM, ALBUMIN, MAGNESIUM, PHOSPHORUS in the last 72 hours.   Liver/Pancreas Enzyme Results Blood Gas     No results for input(s): TOTALPROTEIN, ALBUMIN, PREALBUMIN, AST, ALT, ALKPHOS, LDH, AMYLASE, LIPASE in the last 72 hours.    Invalid input(s): GGT No results found for this encounter     Assessment  Joanna Black is a 76 y.o. female is seen in the endoscopy unit for endoscopic procedures.    Plan   Informed consent was obtained.       Discussed with patient the consent for EGD with  dilatation. Alternative to endoscopy were also discussed. The risks/benefits and complications including but not limited to anesthesia, bleeding, dental injury, aspiration, CV/pulmonary risk, perforation, infection, missed lesions, need for surgery & remote possibility of death were discussed with patient who understand and agreed to proceed.    Proceed to endoscopy procedure as previously planned.     Percell Locus, MD  [facility] - Gastroenterology  [place], [facility]  [place]

## 2022-05-09 ENCOUNTER — Encounter (HOSPITAL_BASED_OUTPATIENT_CLINIC_OR_DEPARTMENT_OTHER): Payer: Self-pay | Admitting: Gastroenterology

## 2022-05-09 ENCOUNTER — Other Ambulatory Visit: Payer: Self-pay

## 2022-05-09 ENCOUNTER — Encounter (HOSPITAL_COMMUNITY): Payer: Self-pay | Admitting: Gastroenterology

## 2022-05-09 LAB — SURGICAL PATHOLOGY SPECIMEN

## 2022-05-09 NOTE — Nursing Note (Signed)
COVID-19 Admission Screen    Low Risk:   Able to Provide asymptomatic History  No recent Travel/ Following Stay at Home  No Sick Contacts  No New Household Conatcts    Moderate/High Risk: {None    Test Result:Not Indicated

## 2022-05-10 ENCOUNTER — Ambulatory Visit (HOSPITAL_BASED_OUTPATIENT_CLINIC_OR_DEPARTMENT_OTHER): Payer: Medicare PPO | Admitting: Registered Nurse

## 2022-05-10 ENCOUNTER — Encounter (HOSPITAL_COMMUNITY)
Admission: RE | Disposition: A | Discharge status: Home / Self Care | Payer: Self-pay | Source: Ambulatory Visit | Attending: Gastroenterology

## 2022-05-10 ENCOUNTER — Encounter (HOSPITAL_COMMUNITY): Payer: Self-pay | Admitting: Gastroenterology

## 2022-05-10 ENCOUNTER — Inpatient Hospital Stay
Admission: RE | Admit: 2022-05-10 | Discharge: 2022-05-10 | Disposition: A | Discharge status: Home / Self Care | Payer: Medicare PPO | Source: Ambulatory Visit | Attending: Gastroenterology | Admitting: Gastroenterology

## 2022-05-10 ENCOUNTER — Other Ambulatory Visit: Payer: Self-pay

## 2022-05-10 ENCOUNTER — Ambulatory Visit (HOSPITAL_COMMUNITY): Payer: Medicare PPO | Admitting: Registered Nurse

## 2022-05-10 DIAGNOSIS — K3189 Other diseases of stomach and duodenum: Secondary | ICD-10-CM

## 2022-05-10 LAB — POC BLOOD GLUCOSE (RESULTS): GLUCOSE, POC: 98 mg/dl (ref 70–110)

## 2022-05-10 SURGERY — ENDOSCOPIC U/S UPPER
Anesthesia: General

## 2022-05-10 MED ORDER — PROPOFOL 10 MG/ML IV BOLUS
INJECTION | Freq: Once | INTRAVENOUS | Status: DC | PRN
Start: 2022-05-10 — End: 2022-05-10
  Administered 2022-05-10: 50 mg via INTRAVENOUS

## 2022-05-10 MED ORDER — LIDOCAINE (PF) 100 MG/5 ML (2 %) INTRAVENOUS SYRINGE
INJECTION | Freq: Once | INTRAVENOUS | Status: DC | PRN
Start: 2022-05-10 — End: 2022-05-10
  Administered 2022-05-10: 100 mg via INTRAVENOUS

## 2022-05-10 MED ORDER — SODIUM CHLORIDE 0.9 % (FLUSH) INJECTION SYRINGE
3.0000 mL | INJECTION | Freq: Three times a day (TID) | INTRAMUSCULAR | Status: DC
Start: 2022-05-10 — End: 2022-05-10

## 2022-05-10 MED ORDER — SIMETHICONE 40 MG/0.6 ML ORAL DROPS,SUSPENSION
Freq: Once | ORAL | Status: DC | PRN
Start: 2022-05-10 — End: 2022-05-10
  Administered 2022-05-10: 40 mg

## 2022-05-10 MED ORDER — LACTATED RINGERS INTRAVENOUS SOLUTION
INTRAVENOUS | Status: DC
Start: 2022-05-10 — End: 2022-05-10

## 2022-05-10 MED ORDER — PROPOFOL 10 MG/ML IV - CHI
INTRAVENOUS | Status: DC | PRN
Start: 2022-05-10 — End: 2022-05-10
  Administered 2022-05-10: 150 ug/kg/min via INTRAVENOUS
  Administered 2022-05-10: 0 ug/kg/min via INTRAVENOUS

## 2022-05-10 MED ORDER — SODIUM CHLORIDE 0.9 % (FLUSH) INJECTION SYRINGE
3.0000 mL | INJECTION | INTRAMUSCULAR | Status: DC | PRN
Start: 2022-05-10 — End: 2022-05-10

## 2022-05-10 SURGICAL SUPPLY — 47 items
BALLOON ENDOS ORCL OLMPS TEAR_RST ELAS GRIP STRL LF (INSTRUMENTS ENDOMECHANICAL)
BITE BLOCK 60FR MAJ1632_50EA/BX (INSTRUMENTS ENDOMECHANICAL) ×1
BLOCK BITE 60FR ADULT STRAP FLXB SH LF  DISP (ENDOSCOPIC SUPPLIES) ×2 IMPLANT
BRUSH CYTO 1.5MM 140CM BLT TIP ERG HNDL ATO STP SHEATH CLBR STRL DISP PTFE BRONCHSCP (ENDOSCOPIC SUPPLIES) IMPLANT
BRUSH CYTOLOGY BRONCHOSCOPY_ERG HNDL ATO STP SHEATH CLBR (INSTRUMENTS ENDOMECHANICAL)
CATH ELHMST INJ GLD PRB 7FR 25_GA .24MM 210CM BIPO RND DIST (INSTRUMENTS ENDOMECHANICAL)
CATH ELHMST INJ GLD PROBE 7FR 25GA .24MM 210CM BIPOLAR RND DIST TIP STD CONN INTGR DISP 2.8MM MN WRK (ENDOSCOPIC SUPPLIES) IMPLANT
CLIP HMST MR CONDITIONAL BRD CATH ROT CONTROL KNOB NO SHEATH RSL 360 235CM 2.8MM 11MM OPN (ENDOSCOPIC SUPPLIES) IMPLANT
DEVICE HEMOSTASIS CLIP 360_235CM RESOLUTION (INSTRUMENTS ENDOMECHANICAL)
DISC USE 161419 BALLOON ENDOS ORCL OLMPS TEAR_RST ELAS GRIP STRL LF (ENDOSCOPIC SUPPLIES) IMPLANT
DISCONTINUED USE ITEM 328361 - JELLY LUB EZ BCTRST H2O SOL NG_RS FLPTP TUBE STRL 2OZ LF (MED SURG SUPPLIES) IMPLANT
FORCEPS BIOPSY HOT 240CM 2.2MM RJ 4 +2.8MM DISP (ENDOSCOPIC SUPPLIES)
FORCEPS BIOPSY HOT 240CM 2.2MM RJ 4 +2.8MM DISPO (ENDOSCOPIC SUPPLIES) IMPLANT
FORCEPS BIOPSY HOT 240CM 2.2MM_RJ 4 +2.8MM DISP (INSTRUMENTS ENDOMECHANICAL)
FORCEPS BIOPSY LRG CPC NEEDLE 240CM 2.2MM RJ 3 DISP ORNG (ENDOSCOPIC SUPPLIES) IMPLANT
FORCEPS BIOPSY NEEDLE 160CM 1.8MM RJ 4 DISP YW 2MM WRK CHNL GSPED (MED SURG SUPPLIES) IMPLANT
FORCEPS BIOPSY NEEDLE 160CM 1._8MM RJ 4 PED 2+ MM DISP (MED SURG SUPPLIES)
FORCEPS BIOPSY NEEDLE 240CM RJ 4 JMB (ENDOSCOPIC SUPPLIES) IMPLANT
FORCEPS BIOPSY NEEDLE 240CM RJ_4 JMB DISP (INSTRUMENTS ENDOMECHANICAL)
FORCEPS BIOPSY NEEDLE 240CM RJ_4 LRG CPC DISP (INSTRUMENTS ENDOMECHANICAL)
GW ENDOSCOPIC .035IN 260CM DREAMWIRE ANG RX EGLD NITINOL BIL STRL DISP (ENDOSCOPIC SUPPLIES) IMPLANT
JELLY LUB EZ BCTRST H2O SOL NG_RS FLPTP TUBE STRL 2OZ LF (MED SURG SUPPLIES)
KIT CAN MDVC ASCP SPECI CNVRT_NONST LF (MED SURG SUPPLIES)
LIGATOR 2.8MM 8.6-11.5MM SSS7 ESOPH 1 STNG MLT BAND HNDL STRL DISP ENDOS HMSTS LF (ENDOSCOPIC SUPPLIES) IMPLANT
LIGATOR 2.8MM 8.6-11.5MM SSS7_ESOPH 1 STNG MLT BAND ERG HNDL (INSTRUMENTS ENDOMECHANICAL)
NEEDLE BIOPSY 19GA 1.14MM ACQUIRE US STRL LF  DISP (ENDOSCOPIC SUPPLIES) ×2 IMPLANT
NEEDLE BIOPSY 19GA 1.14MM ACQU_IRE US STRL LF DISP (INSTRUMENTS ENDOMECHANICAL) ×1
NEEDLE ENDOS 8- CM 19GA 1.73MM NITINOL 2.8MM BVL 1 WY STOPCOCK ASP HNDL EXP SLIMLINE 20ML (ENDOSCOPIC SUPPLIES) IMPLANT
NEEDLE ENDOS 8- CM 19GA 1.73MM_NITINOL 2.8MM BVL 1 WY (INSTRUMENTS ENDOMECHANICAL)
NEEDLE ENDOS 8- CM 22GA 1.65MM NITINOL 2.4MM BVL 1 WY STOPCOCK ASP HNDL EXP SLIMLINE 20ML (ENDOSCOPIC SUPPLIES) IMPLANT
NEEDLE ENDOS 8- CM 22GA 1.65MM_NITINOL 2.4MM BVL 1 WY (INSTRUMENTS ENDOMECHANICAL)
NEEDLE ENDOS 8- CM 25GA 1.52MM NITINOL 2.4MM BVL 1 WY STOPCOCK ASP HNDL EXP SLIMLINE 20ML (ENDOSCOPIC SUPPLIES) IMPLANT
NEEDLE ENDOS 8- CM 25GA 1.52MM_NITINOL 2.4MM BVL 1 WY (INSTRUMENTS ENDOMECHANICAL)
NEEDLE SCLRTX 25GA 2.3MM BVL STRL DISP STAR CATH INTJCT 4MM 240CM (ENDOSCOPIC SUPPLIES) IMPLANT
NEEDLE SCLRTX 25GA 2.3MM BVL S_TRL DISP STAR CATH INTJCT 4MM (INSTRUMENTS ENDOMECHANICAL)
NET SPEC RETR 230CM 2.5MM RTHNT STD SHEATH 6X3CM NONST LF  DISP (ENDOSCOPIC SUPPLIES) IMPLANT
NET SPEC RETR 230CM 2.5MM RTHN_T STD SHTH 6X3CM NONST LF (INSTRUMENTS ENDOMECHANICAL)
PROBE ESURG 220CM 2.3MM FIAPC FLXB STR FIRE STRL DISP (SURGICAL CUTTING SUPPLIES) IMPLANT
PROBE ESURG 220CM 2.3MM FIAPC_FLXB STR FIRE ARGON PLAS COAG (CUTTING ELEMENTS)
SNARE 230CM 2.5MM 3CM PLPK PLP_CTM OVL ENDOS 6CM STRL DISP (INSTRUMENTS ENDOMECHANICAL)
SNARE 230CM 2.5MM 3CM PLPK ROTR RTHNT ENDOS NONST LF  DISP (ENDOSCOPIC SUPPLIES) IMPLANT
SNARE LRG OVL MED STF ENDOS OL_MPS DISP (INSTRUMENTS ENDOMECHANICAL)
SNARE MED OVAL 240CM 2.4MM CAPTIVATR STF ENDOS PLYP 27MM DISP (ENDOSCOPIC SUPPLIES) IMPLANT
SNARE MED OVL 240CM 2.4MM CAPT_IVATR STF ENDOS PLYP 27MM DISP (INSTRUMENTS ENDOMECHANICAL)
SOCK CAN MEDIVAC ASCP SPECI CNVRT NONST LF (MED SURG SUPPLIES) IMPLANT
TRAY GASTRIC LAV 36IN 34FR ARGYLE EDLICH MONOJECT LRG PVC 4 EYE CLS END GRAD SYRG TRNSPR 140CC NONST (MED SURG SUPPLIES) IMPLANT
TRAY GASTRIC LAV 36IN 34FR ARG_YLE EDLICH MONOJECT LRG PVC 4 (MED SURG SUPPLIES)

## 2022-05-10 NOTE — Discharge Instructions (Signed)
Provation MD instruction sheets for after EGD and endoscopic ultrasound reviewed and provided to patient and husband.

## 2022-05-10 NOTE — Anesthesia Postprocedure Evaluation (Signed)
Anesthesia Post Op Evaluation    Patient: Joanna Black  Procedure(s) with comments:  ENDOSCOPIC U/S UPPER with FNA - Please voice message of arrival time tomorrow on [name] phone  GASTROSCOPY    Last Vitals:Temperature: 36.5 C (97.7 F) (05/10/22 1351)  Heart Rate: 60 (05/10/22 1351)  BP (Non-Invasive): 139/67 (05/10/22 1351)  Respiratory Rate: 20 (05/10/22 1351)  SpO2: 97 % (05/10/22 1351)    No notable events documented.    Patient is sufficiently recovered from the effects of anesthesia to participate in the evaluation and has returned to their pre-procedure level.  Patient location during evaluation: bedside       Patient participation: complete - patient participated  Level of consciousness: awake and alert and responsive to verbal stimuli    Pain score: 0  Pain management: adequate  Airway patency: patent    Anesthetic complications: no  Cardiovascular status: acceptable  Respiratory status: acceptable  Hydration status: acceptable  Patient post-procedure temperature: Pt Normothermic   PONV Status: Absent

## 2022-05-10 NOTE — Anesthesia Preprocedure Evaluation (Signed)
ANESTHESIA PRE-OP EVALUATION  Planned Procedure: ENDOSCOPIC U/S UPPER  Review of Systems     anesthesia history negative               Pulmonary   COPD,   Cardiovascular    Hypertension, CAD, CHF, ECG reviewed and Normal sinus rhythm  Anteroseptal infarct , age undetermined  Abnormal ECG  No previous ECGs available  Confirmed by Gregary Signs ([id number]) on 12/01/2021 12:06:35 AM    The left ventricle is small. Concentric remodeling. The left ventricular ejection fraction by visual assessment is estimated to be 55-60%.  Left ventricular systolic function is normal. No segmental/regional wall motion abnormalities identified. Left ventricular diastolic  parameters are normal.  Normal right ventricular size. Normal right ventricular systolic function. Right ventricular systolic pressure is normal.  No significant valvular heart disease. ,No peripheral edema,        GI/Hepatic/Renal    GERD        Endo/Other      type 2 diabetes    Neuro/Psych/MS    back abnormality, depression     Cancer    negative hematology/oncology ROS,                   Physical Assessment      Airway       Mallampati: II    TM distance: >3 FB    Neck ROM: full  Mouth Opening: good.  No Facial hair  No Beard        Dental           (+) partials           Pulmonary      (+) decreased breath sounds present        Cardiovascular    Rhythm: regular  Rate: Normal  (-) no friction rub, carotid bruit is not present, no peripheral edema and no murmur     Other findings            Plan  ASA 3     Planned anesthesia type: general     total intravenous anesthesia                  Intravenous induction     Anesthesia issues/risks discussed are: PONV, Dental Injuries, Intraoperative Awareness/ Recall, Aspiration, Sore Throat, Cardiac Events/MI, Difficult Airway and Stroke.  Anesthetic plan and risks discussed with patient  Signed consent obtained            Patient's NPO status is appropriate for Anesthesia.           Plan discussed with CRNA.

## 2022-05-10 NOTE — H&P (Signed)
[facility]  Operating/Endoscopy Suite H&P/Update    Date:   05/10/2022  Name: Joanna Black  Age: 76 y.o.    Referring Provider:    Edwin Cap, DO  [address]  [address]  [place],  [place] [address]    Primary Care Provider:    Genene Churn, DO    Chief Complaint: duodenal nodule    HPI:    On the day of endoscopy, there is no acute emesis.  On the day of endoscopy, there is no acute mental status changes.      Review of Systems:  General:  Denies fevers/chills  Cardio:  Denies CP/palpitations  Respiratory:  Denies acute changes in SOB/DOE    Past Medical History    Current Facility-Administered Medications:   .  LR premix infusion, , Intravenous, Continuous, Joanna Cap, DO, Last Rate: 25 mL/hr at 05/10/22 1202, New Bag at 05/10/22 1202  .  NS flush syringe, 3 mL, Intracatheter, Q8HRS, Joanna Cap, DO  .  NS flush syringe, 3 mL, Intracatheter, Q1H PRN, Joanna Cap, DO  .  simethicone [facility]) 40mg  per 0.13mL oral drops, , , One-Step Med: Once PRN, 11m, DO, 40 mg at 05/10/22 1236  Allergies   Allergen Reactions   . Amoxicillin    . Augmentin [Amoxicillin-Pot Clavulanate] Nausea/ Vomiting   . Statins-Hmg-Coa Reductase Inhibitors Myalgia     Past Medical History:   Diagnosis Date   . Arthritis    . Back problem    . BMI 35.0-35.9,adult 02/16/2020   . CAD (coronary artery disease)     mild   . Cataracts, bilateral    . CHF (congestive heart failure) (CMS HCC)    . Chronic bronchitis with emphysema (CMS HCC)    . Chronic low back pain    . Claustrophobia    . Depression    . Diabetes (CMS HCC)    . Esophageal reflux    . Essential hypertension 01/28/2019   . Hypercholesterolemia    . Hypertension    . Hypertriglyceridemia    . Type 2 diabetes mellitus (CMS HCC)          Past Surgical History:   Procedure Laterality Date   . CATARACT EXTRACTION, BILATERAL Bilateral    . COLONOSCOPY     . ESOPHAGOGASTRODUODENOSCOPY     . HX CARPAL TUNNEL RELEASE     . HX  CATARACT REMOVAL Right 12/30/2019   . HX CATARACT REMOVAL Left 01/06/2020   . HX CHOLECYSTECTOMY     . HX EXPOSURE TO METAL SHAVINGS     . HX HEART CATHETERIZATION     . HX HYSTERECTOMY     . HX OOPHORECTOMY     . HX TAH AND BSO     . HX TUBAL LIGATION     . HX VEIN STRIPPING      Left leg         Family Medical History:     Problem Relation (Age of Onset)    Bone cancer Father    Diabetes Multiple family members    Hypertension (High Blood Pressure) Multiple family members    No Known Problems Mother    Stomach Cancer Paternal Grandmother          Social History     Socioeconomic History   . Marital status: Married     Spouse name: 01/08/2020   . Number of children: 3   . Years of education: 80   .  Highest education level: High school graduate   Occupational History   . Occupation: Retired   Tobacco Use   . Smoking status: Never     Passive exposure: Never   . Smokeless tobacco: Never   Vaping Use   . Vaping Use: Never used   Substance and Sexual Activity   . Alcohol use: No     Alcohol/week: 0.0 standard drinks   . Drug use: No   . Sexual activity: Not Currently     Partners: Male   Other Topics Concern   . Right hand dominant Yes   . Ability to Walk 1 Flight of Steps without SOB/CP No   . Ability To Do Own ADL's Yes   Social History Narrative    MARRIED.  Lives in single story home.  Heats home with electric and has well water.        Joanna Haney, LPN  1/61/0960, 14:01       Examination:  BP (!) 151/68   Pulse 57   Temp 36.5 C (97.7 F)   Resp 18   Ht 1.651 m (5\' 5" )   Wt 95.3 kg (210 lb)   SpO2 96%   BMI 34.95 kg/m        Wt Readings from Last 2 Encounters:   05/10/22 95.3 kg (210 lb)   05/07/22 94.3 kg (208 lb)     General: no distress  Lungs: Thoracic excursion normal.  No audible stridor.  Cardiovascular: peripheral pulses are present  Abdomen: Soft, non-tender, non-distended  Extremities: No cyanosis or edema  Neurologic: Grossly normal      Assessment  Joanna Black is a 76 y.o. female with a chief  complaint of duodenal nodule    Plan  Endoscopic Ultrasound - Upper and EGD    No contraindications to planned surgery

## 2022-05-10 NOTE — OR PreOp (Signed)
COVID-19 Admission Screen    Low Risk:   Able to Provide asymptomatic History  No recent Travel/ Following Stay at Home  No Sick Contacts  No New Household Conatcts    Moderate/High Risk: {None    Test Result:Not Indicated

## 2022-05-14 LAB — CYTOPATHOLOGY, FINE NEEDLE ASPIRATE

## 2022-05-22 ENCOUNTER — Telehealth (HOSPITAL_BASED_OUTPATIENT_CLINIC_OR_DEPARTMENT_OTHER): Payer: Self-pay | Admitting: Student in an Organized Health Care Education/Training Program

## 2022-05-22 NOTE — Telephone Encounter (Signed)
Dr. Abdel-Aziz ordered an EGD for pt.    Pt is sched on 06/05/2022.  Pt must be NPO after midnight.     -If they have any questions, they may feel free to contact us to discuss any questions they may have.    -The day before procedure, PAT will call to confirm the exact time.  Times may change pending on the sx sched that day.    -Day of procedure pt aware they need to arrive  1.25 hours early on 2nd floor OPS with a driver.    -Pt will not be able to drive for 24 hrs.    -Doc will speak to the pt before and after procedure.    -Clearance needed:   No    Instructions/education mailed: yes  Address confirmed in chart:  No    -Additional comments:

## 2022-05-24 ENCOUNTER — Telehealth (HOSPITAL_BASED_OUTPATIENT_CLINIC_OR_DEPARTMENT_OTHER): Payer: Self-pay

## 2022-05-24 NOTE — Telephone Encounter (Signed)
Attempted to call patient.  Left voicemail that CT is scheduled for 06/18/22 at 4:30pm.  Advised to arrive 15 minutes early, pick up contrast at [facility] and to drink 3 hours prior to test, NPO after midnight.  Left call back information    Randie Heinz, LPN

## 2022-05-28 ENCOUNTER — Other Ambulatory Visit (INDEPENDENT_AMBULATORY_CARE_PROVIDER_SITE_OTHER): Payer: Self-pay | Admitting: Physician Assistant

## 2022-05-28 NOTE — Telephone Encounter (Signed)
.  h 

## 2022-05-30 ENCOUNTER — Encounter (HOSPITAL_BASED_OUTPATIENT_CLINIC_OR_DEPARTMENT_OTHER): Payer: Self-pay | Admitting: Student in an Organized Health Care Education/Training Program

## 2022-06-04 ENCOUNTER — Ambulatory Visit (INDEPENDENT_AMBULATORY_CARE_PROVIDER_SITE_OTHER): Payer: Self-pay | Admitting: Physician Assistant

## 2022-06-04 ENCOUNTER — Encounter (HOSPITAL_COMMUNITY): Payer: Self-pay | Admitting: Student in an Organized Health Care Education/Training Program

## 2022-06-04 NOTE — Nursing Note (Signed)
COVID-19 Admission Screen    Low Risk:   Able to Provide asymptomatic History  None    Moderate/High Risk: {None    Test Result:Not Indicated

## 2022-06-05 ENCOUNTER — Encounter (HOSPITAL_COMMUNITY)
Admission: RE | Disposition: A | Discharge status: Home / Self Care | Payer: Self-pay | Source: Ambulatory Visit | Attending: Student in an Organized Health Care Education/Training Program

## 2022-06-05 ENCOUNTER — Inpatient Hospital Stay
Admission: RE | Admit: 2022-06-05 | Discharge: 2022-06-05 | Disposition: A | Discharge status: Home / Self Care | Payer: Medicare PPO | Source: Ambulatory Visit | Attending: Student in an Organized Health Care Education/Training Program | Admitting: Student in an Organized Health Care Education/Training Program

## 2022-06-05 ENCOUNTER — Ambulatory Visit (HOSPITAL_BASED_OUTPATIENT_CLINIC_OR_DEPARTMENT_OTHER): Payer: Medicare PPO | Admitting: Certified Registered"

## 2022-06-05 ENCOUNTER — Ambulatory Visit (HOSPITAL_COMMUNITY): Payer: Medicare PPO | Admitting: Certified Registered"

## 2022-06-05 ENCOUNTER — Encounter (HOSPITAL_COMMUNITY): Payer: Self-pay | Admitting: Student in an Organized Health Care Education/Training Program

## 2022-06-05 ENCOUNTER — Other Ambulatory Visit: Payer: Self-pay

## 2022-06-05 DIAGNOSIS — K449 Diaphragmatic hernia without obstruction or gangrene: Secondary | ICD-10-CM | POA: Insufficient documentation

## 2022-06-05 DIAGNOSIS — K219 Gastro-esophageal reflux disease without esophagitis: Secondary | ICD-10-CM

## 2022-06-05 DIAGNOSIS — K3189 Other diseases of stomach and duodenum: Secondary | ICD-10-CM | POA: Insufficient documentation

## 2022-06-05 DIAGNOSIS — K222 Esophageal obstruction: Secondary | ICD-10-CM

## 2022-06-05 DIAGNOSIS — R1310 Dysphagia, unspecified: Secondary | ICD-10-CM | POA: Insufficient documentation

## 2022-06-05 HISTORY — DX: Type 2 diabetes mellitus without complications: E11.9

## 2022-06-05 LAB — POC BLOOD GLUCOSE (RESULTS): GLUCOSE, POC: 108 mg/dl (ref 70–110)

## 2022-06-05 SURGERY — GASTROSCOPY WITH DILATION
Anesthesia: General | Site: Mouth | Laterality: Left | Wound class: Clean Contaminated Wounds-The respiratory, GI, Genital, or urinary

## 2022-06-05 MED ORDER — SODIUM CHLORIDE 0.9 % (FLUSH) INJECTION SYRINGE
3.0000 mL | INJECTION | Freq: Three times a day (TID) | INTRAMUSCULAR | Status: DC
Start: 2022-06-05 — End: 2022-06-05

## 2022-06-05 MED ORDER — SIMETHICONE 40 MG/0.6 ML ORAL DROPS,SUSPENSION
Freq: Once | ORAL | Status: DC | PRN
Start: 2022-06-05 — End: 2022-06-05
  Administered 2022-06-05: 40 mg

## 2022-06-05 MED ORDER — LIDOCAINE (PF) 100 MG/5 ML (2 %) INTRAVENOUS SYRINGE
INJECTION | Freq: Once | INTRAVENOUS | Status: DC | PRN
Start: 2022-06-05 — End: 2022-06-05
  Administered 2022-06-05: 100 mg via INTRAVENOUS

## 2022-06-05 MED ORDER — LACTATED RINGERS INTRAVENOUS SOLUTION
INTRAVENOUS | Status: DC
Start: 2022-06-05 — End: 2022-06-05

## 2022-06-05 MED ORDER — SODIUM CHLORIDE 0.9 % (FLUSH) INJECTION SYRINGE
3.0000 mL | INJECTION | INTRAMUSCULAR | Status: DC | PRN
Start: 2022-06-05 — End: 2022-06-05

## 2022-06-05 MED ORDER — PROPOFOL 10 MG/ML IV BOLUS
INJECTION | Freq: Once | INTRAVENOUS | Status: DC | PRN
Start: 2022-06-05 — End: 2022-06-05
  Administered 2022-06-05: 100 mg via INTRAVENOUS
  Administered 2022-06-05 (×3): 50 mg via INTRAVENOUS

## 2022-06-05 SURGICAL SUPPLY — 33 items
BITE BLOCK 60FR MAJ1632_50EA/BX (INSTRUMENTS ENDOMECHANICAL) ×1
BLOCK BITE 60FR ADULT STRAP FLXB SH LF  DISP (ENDOSCOPIC SUPPLIES) ×1 IMPLANT
CATH ELHMST INJ GLD PRB 7FR 25_GA .24MM 210CM BIPO RND DIST (INSTRUMENTS ENDOMECHANICAL)
CATH ELHMST INJ GLD PROBE 7FR 25GA .24MM 210CM BIPOLAR RND DIST TIP STD CONN INTGR DISP 2.8MM MN WRK (ENDOSCOPIC SUPPLIES) IMPLANT
CLIP HMST MR CONDITIONAL BRD CATH ROT CONTROL KNOB NO SHEATH RSL 360 235CM 2.8MM 11MM OPN (ENDOSCOPIC SUPPLIES) IMPLANT
DEVICE HEMOSTASIS CLIP 360_235CM RESOLUTION (INSTRUMENTS ENDOMECHANICAL)
DILATOR ENDOS CRE 180CM 5.5CM 18-19-20MM 7.5FR ESOPH PYL COLON BAL LOW PROF GW PEBAX STRL LF  DISP (ENDOSCOPIC SUPPLIES) ×1 IMPLANT
DILATOR ENDOS CRE 180CM 5.5CM_18-19-20MM 7.5FR ESOPH PYL (INSTRUMENTS ENDOMECHANICAL) ×1
FORCEPS BIOPSY HOT 240CM 2.2MM RJ 4 +2.8MM DISP (ENDOSCOPIC SUPPLIES)
FORCEPS BIOPSY HOT 240CM 2.2MM RJ 4 +2.8MM DISPO (ENDOSCOPIC SUPPLIES) IMPLANT
FORCEPS BIOPSY HOT 240CM 2.2MM_RJ 4 +2.8MM DISP (INSTRUMENTS ENDOMECHANICAL)
FORCEPS BIOPSY LRG CPC NEEDLE 240CM 2.2MM RJ 3 DISP ORNG (ENDOSCOPIC SUPPLIES) IMPLANT
FORCEPS BIOPSY NEEDLE 160CM 1.8MM RJ 4 DISP YW 2MM WRK CHNL GSPED (MED SURG SUPPLIES) IMPLANT
FORCEPS BIOPSY NEEDLE 160CM 1._8MM RJ 4 PED 2+ MM DISP (MED SURG SUPPLIES)
FORCEPS BIOPSY NEEDLE 240CM RJ 4 JMB (ENDOSCOPIC SUPPLIES) IMPLANT
FORCEPS BIOPSY NEEDLE 240CM RJ_4 JMB DISP (INSTRUMENTS ENDOMECHANICAL)
FORCEPS BIOPSY NEEDLE 240CM RJ_4 LRG CPC DISP (INSTRUMENTS ENDOMECHANICAL)
GW ENDOSCOPIC .035IN 260CM DREAMWIRE ANG RX EGLD NITINOL BIL STRL DISP (ENDOSCOPIC SUPPLIES) IMPLANT
LIGATOR 2.8MM 8.6-11.5MM SSS7 ESOPH 1 STNG MLT BAND HNDL STRL DISP ENDOS HMSTS LF (ENDOSCOPIC SUPPLIES) IMPLANT
LIGATOR 2.8MM 8.6-11.5MM SSS7_ESOPH 1 STNG MLT BAND ERG HNDL (INSTRUMENTS ENDOMECHANICAL)
NEEDLE SCLRTX 25GA 2.3MM BVL STRL DISP STAR CATH INTJCT 4MM 240CM (ENDOSCOPIC SUPPLIES) IMPLANT
NEEDLE SCLRTX 25GA 2.3MM BVL S_TRL DISP STAR CATH INTJCT 4MM (INSTRUMENTS ENDOMECHANICAL)
NET SPEC RETR 230CM 2.5MM RTHNT STD SHEATH 6X3CM NONST LF  DISP (ENDOSCOPIC SUPPLIES) IMPLANT
NET SPEC RETR 230CM 2.5MM RTHN_T STD SHTH 6X3CM NONST LF (INSTRUMENTS ENDOMECHANICAL)
PROBE ESURG 220CM 2.3MM FIAPC FLXB STR FIRE STRL DISP (SURGICAL CUTTING SUPPLIES) IMPLANT
PROBE ESURG 220CM 2.3MM FIAPC_FLXB STR FIRE ARGON PLAS COAG (CUTTING ELEMENTS)
SNARE 230CM 2.5MM 3CM PLPK PLP_CTM OVL ENDOS 6CM STRL DISP (INSTRUMENTS ENDOMECHANICAL)
SNARE 230CM 2.5MM 3CM PLPK ROTR RTHNT ENDOS NONST LF  DISP (ENDOSCOPIC SUPPLIES) IMPLANT
SNARE LRG OVL MED STF ENDOS OL_MPS DISP (INSTRUMENTS ENDOMECHANICAL)
SNARE MED OVAL 240CM 2.4MM CAPTIVATR STF ENDOS PLYP 27MM DISP (ENDOSCOPIC SUPPLIES) IMPLANT
SNARE MED OVL 240CM 2.4MM CAPT_IVATR STF ENDOS PLYP 27MM DISP (INSTRUMENTS ENDOMECHANICAL)
SYRINGE INFLAT ALN II GA STRL DISP 60ML (ENDOSCOPIC SUPPLIES) ×1 IMPLANT
SYRINGE INFLAT ALN II GA STRL_DISP 60ML (INSTRUMENTS ENDOMECHANICAL) ×1

## 2022-06-05 NOTE — Anesthesia Preprocedure Evaluation (Addendum)
ANESTHESIA PRE-OP EVALUATION  Planned Procedure: GASTROSCOPY WITH DILATION (Left: Mouth)  Review of Systems     anesthesia history negative     patient summary reviewed  nursing notes reviewed        Pulmonary   COPD, mild and rescue inhaler,   Cardiovascular    Hypertension, CAD, CHF, ECG reviewed,     Nonischemic cardiomyopathy    EKG  Normal sinus rhythm  Anteroseptal infarct , age undetermined  Abnormal ECG  No previous ECGs available  Confirmed by Gregary Signs ([id number]) on 12/01/2021 12:06:35 AM    ECHO  The left ventricle is small. Concentric remodeling. The left ventricular ejection fraction by visual assessment is estimated to be 55-60%.  Left ventricular systolic function is normal. No segmental/regional wall motion abnormalities identified. Left ventricular diastolic  parameters are normal.  Normal right ventricular size. Normal right ventricular systolic function. Right ventricular systolic pressure is normal.  No significant valvular heart disease.     and hyperlipidemia ,No peripheral edema,   ,beta blocker therapy  ,not taken in last 24 hours     GI/Hepatic/Renal    GERD and renal insufficiency        Endo/Other    osteoarthritis and obesity,   type 2 diabetes    Neuro/Psych/MS    back abnormality, depression     Cancer    negative hematology/oncology ROS,                 Physical Assessment      Airway       Mallampati: II    TM distance: <3 FB    Neck ROM: full  Mouth Opening: good.  No Facial hair  No Beard        Dental           (+) partials, missing             Pulmonary      (+) decreased breath sounds present        Cardiovascular    Rhythm: regular  Rate: Normal  (-) no friction rub, carotid bruit is not present, no peripheral edema and no murmur     Other findings            Plan  ASA 3     Planned anesthesia type: general     total intravenous anesthesia                  Intravenous induction     Anesthesia issues/risks discussed are: PONV, Dental Injuries, Intraoperative Awareness/ Recall,  Aspiration, Sore Throat, Cardiac Events/MI, Difficult Airway, Stroke, Nerve Injuries, Post-op Intubation/Ventilation, Eye /Visual Loss, Post-op Agitation/Tantrum, Post-op Cognitive Dysfunction and Blood Loss.  Anesthetic plan and risks discussed with patient  Signed consent obtained            Patient's NPO status is appropriate for Anesthesia.           Plan discussed with CRNA.

## 2022-06-05 NOTE — H&P (Signed)
[facility]  Operating Room H&P/Update    Name: Joanna Black  Age: 76 y.o.    Primary Care Provider:  Genene Churn, DO    HPI:    Joanna Black is a pleasent 76 y.o. female with past medical history as mentioned below is in the endoscopic unit for further evaluation of :      1. Worsening dysphagia to solid and liquid  2. History of esophageal stricture status post dilatation   3. Gastroesophageal reflux disease    Patient is planned for EGD today as previously recommended. There is no change in interval history from prior visit.         Review of Systems:  Constitutional: Denies fevers, chills  Cardiovascular: Denies chest pain  Neurological: Denies seizures    Past Medical History  Past Medical History:   Diagnosis Date   . Arthritis    . Back problem    . BMI 35.0-35.9,adult 02/16/2020   . CAD (coronary artery disease)     mild   . Cataracts, bilateral    . CHF (congestive heart failure) (CMS HCC)    . Chronic bronchitis with emphysema (CMS HCC)    . Chronic low back pain    . Claustrophobia    . Depression    . Diabetes (CMS HCC)    . Diabetes mellitus, type 2 (CMS HCC)    . Esophageal reflux    . Essential hypertension 01/28/2019   . Hypercholesterolemia    . Hypertension    . Hypertriglyceridemia    . Type 2 diabetes mellitus (CMS HCC)          Past Surgical History:   Procedure Laterality Date   . CATARACT EXTRACTION, BILATERAL Bilateral     bilateral lens implant   . COLONOSCOPY     . ESOPHAGOGASTRODUODENOSCOPY     . HX CARPAL TUNNEL RELEASE     . HX CATARACT REMOVAL Right 12/30/2019   . HX CATARACT REMOVAL Left 01/06/2020   . HX CHOLECYSTECTOMY     . HX EXPOSURE TO METAL SHAVINGS     . HX HEART CATHETERIZATION     . HX HYSTERECTOMY     . HX OOPHORECTOMY     . HX TAH AND BSO     . HX TUBAL LIGATION     . HX VEIN STRIPPING      Left leg         Family Medical History:     Problem Relation (Age of Onset)    Bone cancer Father    Diabetes Multiple family members    Hypertension (High Blood  Pressure) Multiple family members    No Known Problems Mother    Stomach Cancer Paternal Grandmother          Social History     Socioeconomic History   . Marital status: Married     Spouse name: Duke Salvia   . Number of children: 3   . Years of education: 97   . Highest education level: High school graduate   Occupational History   . Occupation: Retired   Tobacco Use   . Smoking status: Never     Passive exposure: Never   . Smokeless tobacco: Never   Vaping Use   . Vaping Use: Never used   Substance and Sexual Activity   . Alcohol use: No     Alcohol/week: 0.0 standard drinks   . Drug use: No   . Sexual activity: Not Currently  Partners: Male   Other Topics Concern   . Right hand dominant Yes   . Ability to Walk 1 Flight of Steps without SOB/CP No   . Ability To Do Own ADL's Yes   Social History Narrative    MARRIED.  Lives in single story home.  Heats home with electric and has well water.        April Haney, LPN  5/62/5638, 14:01     @MEDSTAKING @  Allergies   Allergen Reactions   . Amoxicillin    . Augmentin [Amoxicillin-Pot Clavulanate] Nausea/ Vomiting   . Statins-Hmg-Coa Reductase Inhibitors Myalgia       Examination:  BP (!) 144/54   Pulse 57   Temp 36.4 C (97.5 F)   Resp 18   Ht 1.651 m (5\' 5" )   Wt 95.3 kg (210 lb)   SpO2 99%   BMI 34.95 kg/m        Wt Readings from Last 2 Encounters:   06/05/22 95.3 kg (210 lb)   05/10/22 95.3 kg (210 lb)     Head: Atraumatic and normocephalic  Eyes: Conjunctiva clear, sclera non-icteric  Neck:  Supple  Lungs: Normal reparatory effort bilaterally  Cardiovascular: Regular rate  Neurologic: Alert,  Grossly normal  Psychiatric: Appears normal    CBC Results Coags Results   No results for input(s): WBC, HGB, HCT, PLTCNT, BANDS in the last 72 hours.    Invalid input(s): PLATELETCOUNT No results for input(s): INR, PROTHROMTME, APTT in the last 72 hours.    Invalid input(s): PTT, PT, CREACTPROTIE   BMP Results Other Chemistries Results   No results for input(s):  SODIUM, POTASSIUM, CHLORIDE, CO2, BUN, CREATININE, GFR, ANIONGAP in the last 72 hours. No results for input(s): CALCIUM, ALBUMIN, MAGNESIUM, PHOSPHORUS in the last 72 hours.   Liver/Pancreas Enzyme Results Blood Gas     No results for input(s): TOTALPROTEIN, ALBUMIN, PREALBUMIN, AST, ALT, ALKPHOS, LDH, AMYLASE, LIPASE in the last 72 hours.    Invalid input(s): GGT No results found for this encounter     Assessment  Joanna Black is a 76 y.o. female is seen in the endoscopy unit for endoscopic procedures.    Plan   Informed consent was obtained.       Discussed with patient the consent for EGD. Alternative to endoscopy were also discussed. The risks/benefits and complications including but not limited to anesthesia, bleeding, dental injury, aspiration, CV/pulmonary risk, perforation, infection, missed lesions, need for surgery & remote possibility of death were discussed with patient who understand and agreed to proceed.    Proceed to endoscopy procedure as previously planned.     Percell Locus, MD  [facility] - Gastroenterology  [place], [facility]  [place]

## 2022-06-05 NOTE — Anesthesia Postprocedure Evaluation (Signed)
Anesthesia Post Op Evaluation    Patient: Joanna Black  Procedure(s):  GASTROSCOPY WITH DILATION    Last Vitals:Temperature: 36.4 C (97.5 F) (06/05/22 0900)  Heart Rate: 57 (06/05/22 0900)  BP (Non-Invasive): (!) 144/54 (06/05/22 0900)  Respiratory Rate: 18 (06/05/22 0900)  SpO2: 99 % (06/05/22 0900)    No notable events documented.    Patient is sufficiently recovered from the effects of anesthesia to participate in the evaluation and has returned to their pre-procedure level.  Patient location during evaluation: bedside       Patient participation: complete - patient participated  Level of consciousness: awake and alert and responsive to verbal stimuli    Pain score: 0  Pain management: adequate  Airway patency: patent    Anesthetic complications: no  Cardiovascular status: acceptable  Respiratory status: acceptable  Hydration status: acceptable  Patient post-procedure temperature: Pt Normothermic   PONV Status: Absent

## 2022-06-05 NOTE — Discharge Instructions (Signed)
[facility] Discharge Instructions after an EGD with Dilation given to patient and discussed

## 2022-06-05 NOTE — OR PreOp (Signed)
COVID-19 Admission Screen    Low Risk:  No recent Travel/ Following Stay at Home    Moderate/High Risk: {None    Test Result:Not Indicated

## 2022-06-10 ENCOUNTER — Other Ambulatory Visit (INDEPENDENT_AMBULATORY_CARE_PROVIDER_SITE_OTHER): Payer: Self-pay | Admitting: Family Medicine

## 2022-06-10 DIAGNOSIS — K529 Noninfective gastroenteritis and colitis, unspecified: Secondary | ICD-10-CM

## 2022-06-11 ENCOUNTER — Other Ambulatory Visit (HOSPITAL_COMMUNITY): Payer: Self-pay

## 2022-06-11 ENCOUNTER — Other Ambulatory Visit (INDEPENDENT_AMBULATORY_CARE_PROVIDER_SITE_OTHER): Payer: Self-pay | Admitting: Physician Assistant

## 2022-06-11 DIAGNOSIS — I10 Essential (primary) hypertension: Secondary | ICD-10-CM

## 2022-06-11 NOTE — Progress Notes (Unsigned)
Nephrology [facility]  Consult Progress note      Date: 3/81/8299  Name: Joanna Black  DOB: [birth year]    Background: Joanna Black is a 76 y.o. female refer to nephrology for CKD stage IIIB management.  Per chart review patient has had a baseline serum creatinine of 1.3 to 1.5 mg/dL from 2020 to 2022.  Patient had recent elevation of serum creatinine of 1.79 to 1.67 mg/dL since beginning of the year.  Regards of etiology of patient's CKD patient has been diagnosed with diabetes mellitus type 2 for the past 5 years.  Patient denies any retinopathy or neuropathy.  She has also has a history of hypertension diagnosed 5 years ago however does not check her blood pressures at home.  Patient also has a history of CHF diagnosed in [place] 7 years ago currently followed by Cardiology most recent TTE on January 2023 shows EF 55-60%.  Patient also has a history of GERD was not PPI 3 years was switched H2 blocker 1 year ago.  Patient also has a prolonged history of chronic NSAID use for over 40 years.  Patient denies any dysuria, hematuria, weight loss, fevers, epistaxis, or hemoptysis.  Patient does not endorse shortness of breath with a history of COPD with emphysema.  Patient denies any family history of CKD or ESRD.      06/12/22:  Patient presents to clinic for CKD stage 3 follow-up.  Pt underwent esophageal dilation on 6/17.  Patient complaining of possible UTI will obtain urine sample today.  Patient did report with blood pressures was showed blood pressure within range back in April however since end of May to now blood pressures have been ranging in the 893Y to 101B systolic over 51W diastolic.  However, after investigation it was uncover that patient is not wearing the proper 5-10 minutes prior to obtain blood pressure.  I instructed patient to with the proper technique continue log and reported clinic in 1 week with blood pressures.    Physical Exam:  Vitals:    06/12/22 1129   BP: (!) 160/80    Pulse: 64   Temp: 36.6 C (97.8 F)   SpO2: 94%   Weight: 95 kg (209 lb 7 oz)   Height: 1.651 m (_0 )   BMI: 34.92           General: No apparent distress  Head:  Normocephalic and atraumatic  Eyes:  PERRLA, conjunctiva clear  Mouth:  Moist mucous membranes  Neck:  Supple, trachea midline, no JVD  Lungs:  CTAB, no wheezing  Heart:  RRR, no murmurs  Abdomen: Soft, non-tender to palpation + BS  Extremities: No cyanosis, no edema  Skin:  Warm and dry  Neurologic: No focal deficits, Alert  Psychiatric: Normal mood and affect       Labs   I have reviewed the labs     Imaging Studies  Renal ultrasound ordered      Assessment and Plan  Orders Placed This Encounter   . RENAL FUNCTION PANEL   . MAGNESIUM   . URINALYSIS WITH REFLEX MICROSCOPIC AND CULTURE IF POSITIVE       CKD stage IIIB  - 2/2 hypertension, diabetes, obesity, chronic NSAID use.  - Baseline cr was previously 1.3-1.5 mg/dL until January of 2023 resume Grand rose to 1.79 to 1.67 mg/dL with gfr 29-32 ml/min/bsa   - most recent serum creatinine 1.44 mg/dL with EGFR 38  - Counseled on avoidance of NSAIDs and  IV contrast.  If IV contrast is needed for proper diagnosis recommend IV volume resuscitation prior to and following CT contrast if no other modality is able to be used.  - UA:   no RBC's, UACR: 12.3 Repeat UA today   - Renal imaging:  Ordered  - Goal A1c < 7, most recent A1c 5.9% on 03/13/2022  - Management of CKD is based upon continued diabetes management and hypertension management.  Avoidance of all NSAID products recommend using only acetaminophen for pain.  Further recommendations following repeat labs and further studies  - standing RFP with labs today and prior to next visit.     Blood pressure  - goal < 120/80  - currently on irbesartan 75 mg daily and metoprolol succinate 25 mg daily  - Pt was advised to hold ARB in the setting of dehydration to avoid AKI and hyperkalemia.   - advised patient to obtain blood pressure log at home and RTC with  lock, will adjust medications as indicated  - I have discussed the proper technique for obtaining blood pressures at home as detailed below.   - When checking your blood pressure, please do the following:  - Sit upright in a chair with back supported in a quiet location for 5 minutes  - Have your legs uncrossed with your feet touching the floor  - No caffeine 30 minutes prior to checking your blood pressure  - Have your arm at approximately the same height as your heart resting on surface   - Keep a log/diary of your blood pressure readings to review with your doctor       Electrolytes  - stable with no derangement of Na or K    Acid/Base  - Stable with bicarb no indication for supplementation    Volume Status  - euvolemic with no need for diuretic therapy    H/H  - Will check CBC and then iron studies if indicated    Renal-BMD  - Ca, Mg, Phos, Vitamin d: WNL  - iPTH:  elevated at 185.7 will repeat in 6 months.       Follow up in 6 months    Gillermina Hu, MD, [place]   Dept. of Nephrology      On the day of the encounter, a total of  36 minutes was spent on this patient encounter including review of historical information, examination, documentation and post-visit activities. The time documented excludes procedural time.      This note may have been partially generated using MModal Fluency Direct system, and there may be some incorrect words, spellings, and punctuation that were not noted in checking the note before saving

## 2022-06-12 ENCOUNTER — Other Ambulatory Visit: Payer: Self-pay

## 2022-06-12 ENCOUNTER — Telehealth (HOSPITAL_BASED_OUTPATIENT_CLINIC_OR_DEPARTMENT_OTHER): Payer: Self-pay | Admitting: Student in an Organized Health Care Education/Training Program

## 2022-06-12 ENCOUNTER — Encounter (HOSPITAL_BASED_OUTPATIENT_CLINIC_OR_DEPARTMENT_OTHER): Payer: Self-pay | Admitting: NEPHROLOGY

## 2022-06-12 ENCOUNTER — Ambulatory Visit (HOSPITAL_BASED_OUTPATIENT_CLINIC_OR_DEPARTMENT_OTHER): Payer: Medicare PPO | Admitting: NEPHROLOGY

## 2022-06-12 ENCOUNTER — Other Ambulatory Visit (HOSPITAL_COMMUNITY): Payer: Medicare PPO

## 2022-06-12 ENCOUNTER — Encounter (HOSPITAL_BASED_OUTPATIENT_CLINIC_OR_DEPARTMENT_OTHER): Payer: Self-pay | Admitting: Student in an Organized Health Care Education/Training Program

## 2022-06-12 ENCOUNTER — Ambulatory Visit
Payer: Medicare PPO | Attending: Student in an Organized Health Care Education/Training Program | Admitting: Student in an Organized Health Care Education/Training Program

## 2022-06-12 ENCOUNTER — Ambulatory Visit (HOSPITAL_COMMUNITY): Payer: Self-pay | Admitting: Radiology

## 2022-06-12 ENCOUNTER — Inpatient Hospital Stay (INDEPENDENT_AMBULATORY_CARE_PROVIDER_SITE_OTHER)
Admission: RE | Admit: 2022-06-12 | Discharge: 2022-06-12 | Disposition: A | Discharge status: Home / Self Care | Payer: Medicare PPO | Source: Ambulatory Visit

## 2022-06-12 VITALS — BP 170/76 | HR 61 | Temp 97.5°F | Ht 65.0 in | Wt 209.4 lb

## 2022-06-12 VITALS — BP 160/80 | HR 64 | Temp 97.8°F | Ht 65.0 in | Wt 209.4 lb

## 2022-06-12 DIAGNOSIS — N1832 Chronic kidney disease, stage 3b (CMS HCC): Secondary | ICD-10-CM

## 2022-06-12 DIAGNOSIS — J439 Emphysema, unspecified: Secondary | ICD-10-CM | POA: Insufficient documentation

## 2022-06-12 DIAGNOSIS — J45901 Unspecified asthma with (acute) exacerbation: Secondary | ICD-10-CM | POA: Insufficient documentation

## 2022-06-12 DIAGNOSIS — J4540 Moderate persistent asthma, uncomplicated: Secondary | ICD-10-CM | POA: Insufficient documentation

## 2022-06-12 LAB — URINALYSIS, MACRO/MICRO
BILIRUBIN: NEGATIVE mg/dL
COLOR: NORMAL
GLUCOSE: NORMAL mg/dL
KETONES: NEGATIVE mg/dL
LEUKOCYTES: 500 WBCs/uL — AB
NITRITE: NEGATIVE
PH: 5.5 (ref 5.0–8.0)
PROTEIN: NEGATIVE mg/dL
SPECIFIC GRAVITY: 1.004 — ABNORMAL LOW (ref 1.005–1.030)
UROBILINOGEN: NORMAL mg/dL
WBCS: >100 /hpf — AB

## 2022-06-12 MED ORDER — PREDNISONE 20 MG TABLET
40.0000 mg | ORAL_TABLET | Freq: Every day | ORAL | 0 refills | Status: DC
Start: 2022-06-12 — End: 2022-08-28

## 2022-06-12 MED ORDER — FLUTICASONE 500 MCG-SALMETEROL 50 MCG/DOSE BLISTR POWDR FOR INHALATION
1.0000 | DISK | Freq: Two times a day (BID) | RESPIRATORY_TRACT | 3 refills | Status: DC
Start: 2022-06-12 — End: 2022-08-28

## 2022-06-12 MED ORDER — ALBUTEROL SULFATE 2.5 MG/3 ML (0.083 %) SOLUTION FOR NEBULIZATION
2.5000 mg | INHALATION_SOLUTION | RESPIRATORY_TRACT | 3 refills | Status: DC | PRN
Start: 2022-06-12 — End: 2023-07-04

## 2022-06-12 NOTE — Telephone Encounter (Signed)
Voicemail left regarding patients preferred DME company.

## 2022-06-12 NOTE — Progress Notes (Unsigned)
Pulmonary/Critical Care  [address]  [place] [address]  Phone: [phone]  Fax: [phone]  NAME: Joanna Black  DOB:  09-13-1946  AGE:  76 y.o.  MRN:  [id number]  APPT:  06/12/2022  1:00 PM EDT    Primary Care Physician: Genene Churn, DO   Referring Physician: Kathalene Frames, PA-C  [address]  [place],  [place] [address]    Reason for Referral:  Shortness of breath    CHIEF COMPLAINT:  Shortness of breath    History of Present Illness  Joanna Black is a 76 y.o. female never smoker, with PMHx of congestive heart failure, CKD, diabetes who is referred by Clinton Quant, PA-C to evaluate for shortness of breath.    Patient reports she has had issues with her breathing for many years.  About a decade ago she was placed on Advair for breathing issues, which helped her with the symptoms.  However at some point insurance company stopped refilling her Advair and she stopped using it.  In the last year or so she has been experiencing worsening shortness of breath.  She reports in the last month she also has been having more wheezing.  Currently she reports shortness of breath, mMRC Dyspnea Scale: (+3) Stops for breath after walking 100 yards (91 m) or after a few minutes.  She denies having any cough.  Does report having whitish thick sputum production.  Also has associated chest tightness.  Denies hemoptysis.  Reports minimal allergy symptoms, once in a while feels runny nose in the morning.  She takes Pepcid for acid reflux        Currently using:  Inhaled bronchodilators  None    Allergy medication:  Acid Reflux:   ***      Social Hx:  Smoking history as above  Work:  Worked in a [employer], exposed to [employer]ator fumes  Pets:  Dogs, no allergies around dogs          REVIEW OF SYSTEMS:      Other than ROS in the HPI, all other systems were negative.    Past Medical History:   Past Medical History:   Diagnosis Date   . Arthritis    . Back problem    . BMI 35.0-35.9,adult 02/16/2020   . CAD  (coronary artery disease)     mild   . Cataracts, bilateral    . CHF (congestive heart failure) (CMS HCC)    . Chronic bronchitis with emphysema (CMS HCC)    . Chronic low back pain    . Claustrophobia    . Depression    . Diabetes (CMS HCC)    . Diabetes mellitus, type 2 (CMS HCC)    . Esophageal reflux    . Essential hypertension 01/28/2019   . Hypercholesterolemia    . Hypertension    . Hypertriglyceridemia    . Type 2 diabetes mellitus (CMS HCC)          Surgical history:  Past Surgical History:   Procedure Laterality Date   . CATARACT EXTRACTION, BILATERAL Bilateral     bilateral lens implant   . COLONOSCOPY     . ESOPHAGOGASTRODUODENOSCOPY     . HX CARPAL TUNNEL RELEASE     . HX CATARACT REMOVAL Right 12/30/2019   . HX CATARACT REMOVAL Left 01/06/2020   . HX CHOLECYSTECTOMY     . HX EXPOSURE TO METAL SHAVINGS     . HX HEART CATHETERIZATION     .  HX HYSTERECTOMY     . HX OOPHORECTOMY     . HX TAH AND BSO     . HX TUBAL LIGATION     . HX VEIN STRIPPING      Left leg           Family history:  Family Medical History:     Problem Relation (Age of Onset)    Bone cancer Father    Diabetes Multiple family members    Hypertension (High Blood Pressure) Multiple family members    No Known Problems Mother    Stomach Cancer Paternal Grandmother            Social history:   Social History     Tobacco Use   Smoking Status Never   . Passive exposure: Never   Smokeless Tobacco Never         Allergies:  Amoxicillin, Augmentin [amoxicillin-pot clavulanate], and Statins-hmg-coa reductase inhibitors    Medications:  Current Outpatient Medications   Medication Sig   . acetaminophen (TYLENOL) 325 mg Oral Tablet Take 1 Tablet (325 mg total) by mouth Every night as needed Takes 2 at night   . albuterol sulfate (PROVENTIL OR VENTOLIN OR PROAIR) 90 mcg/actuation Inhalation oral inhaler Take 1-2 Puffs by inhalation Every 6 hours as needed   . aspirin (ECOTRIN) 81 mg Oral Tablet, Delayed Release (E.C.) Take 1 Tablet (81 mg total) by  mouth Once a day   . calcium carbonate/vitamin D3 (VITAMIN D-3 ORAL) Take by mouth Once a day   . famotidine (PEPCID) 40 mg Oral Tablet TAKE 1 TABLET BY MOUTH EVERY DAY IN THE EVENING   . FLUoxetine (PROZAC) 20 mg Oral Capsule TAKE 1 CAPSULE BY MOUTH EVERY DAY (Patient taking differently: Every night)   . Irbesartan (AVAPRO) 75 mg Oral Tablet TAKE 1 TABLET BY MOUTH EVERY DAY (STOP LOSARTAN/HCTZ) (Patient taking differently: Every evening)   . metoprolol succinate (TOPROL-XL) 25 mg Oral Tablet Sustained Release 24 hr TAKE 1 TABLET BY MOUTH EVERY DAY AT NIGHT   . tiZANidine (ZANAFLEX) 4 mg Oral Tablet TAKE 1 TABLET THREE TIMES A DAY AS NEEDED FOR MUSCLE CRAMPS Strength: 4 mg   . TRULICITY 0.75 mg/0.5 mL Subcutaneous Pen Injector INJECT 0.5 ML (0.75 MG) UNDER  THE SKIN EVERY 7 DAYS         PHYSICAL EXAM:   BP (!) 170/76   Pulse 61   Temp 36.4 C (97.5 F)   Ht 1.651 m (5\' 5" )   Wt 95 kg (209 lb 7 oz)   SpO2 95%   BMI 34.85 kg/m         Physical Exam***          Labs and Imaging   Lab Results   Component Value Date    WBC 9.2 04/09/2022    RBC 4.04 04/09/2022    HGB 13.1 04/09/2022    HCT 40.3 04/09/2022    MCV 99.8 04/09/2022    MCH 32.4 (H) 04/09/2022    MCHC 32.5 04/09/2022    RDW 14.1 11/30/2021    MPV 11.2 04/09/2022      Eosinophils: ***  Lab Results   Component Value Date    BUN 20 04/09/2022    CREATININE 1.44 (H) 04/09/2022    CO2 27 04/09/2022    CALCIUM 9.5 04/09/2022    ALKPHOS 113 11/30/2021    AST 17 11/30/2021    ALT 16 11/30/2021    BNP 43 11/30/2021  Imaging:    Chest x-ray 12/01/2021  FINDINGS:  The lungs appear clear. There is no evidence of pneumonia, edema or effusion. Cardiomediastinal silhouette is normal.    IMPRESSION:  No acute pulmonary disease.     ECHO 12/31/2021  Conclusions:  The left ventricle is small. Concentric remodeling. The left ventricular ejection fraction by visual assessment is estimated to be 55-60%. Left ventricular systolic function is normal.  No  segmental/regional wall motion abnormalities identified. Left ventricular diastolic parameters are normal.    Normal right ventricular size. Normal right ventricular systolic function. Right ventricular systolic pressure is normal.    No significant valvular heart disease.        Pulmonary Function Testing:   Date   FVC   % pred   FEV1   % pred   FEV1/FVC   TLC % pred DLCO   % pred                                                           The testing from *** was personally reviewed today in clinic     Outside records:***     ASSESSMENT AND PLAN     ENCOUNTER DIAGNOSES     ICD-10-CM   1. Pulmonary emphysema, unspecified emphysema type (CMS HCC)  J43.9         On my interpretation of the chest x-ray done in December 2022, it looks normal.  Patient's history and symptoms are consistent with asthma.  She is currently experiencing asthma exacerbation.      - prescription for prednisone 40 mg for 5 days sent  - will start patient on Advair 500/50, 2 puffs b.i.d..  Rinse mouth after every use  - prescription for nebulizer sent  - albuterol nebs 2.5 mg q.6 p.r.n.  - will get PFT  - return to clinic in 3 months, sooner if needed.  Patient advised to call back if her shortness of breath does not start improving in the next week or so.          -Tobaccoism  We spent 3 minutes to discussed the risk of smoking, which does not limited to worsening of respiratory symptoms, aggravate lung pathology, joint pain and strong links to malignancies.  I strongly advised patient on smoking cessation and provide written information on the options available. The patient {IS/IS NOT:20179} interested in smoking cessation services.   {Plan; smoking cessation:18119}***        No orders of the defined types were placed in this encounter.        Immunization History   Administered Date(s) Administered   . Covid-19 Vaccine,Moderna,12 Years+ 01/20/2020, 02/17/2020, 11/01/2020   . Covid-19 Vaccine,Pfizer Bivalent,100mcg/0.3ml,12 yrs+ 11/14/2021    . FLUZONE HD VACCINE (ADMIN) 09/25/2020   . High-Dose Influenza Vaccine, 65+ 11/25/2017, 10/26/2018, 09/25/2020, 11/14/2021   . INFLUENZA VIRUS VACCINE (ADMIN) 10/20/2015   . Influenza Vaccine, 6 month-adult 09/09/2008   . Influenza Vaccine, 65+ 09/07/2019   . PREVNAR 13 01/06/2013   . Pneumovax 01/06/2014   . ZOSTAVAX (VARICELLA ZOSTER VACCINE) 10/21/2013             The patient was given the opportunity to ask questions which were addressed to the best of my ability. The patient voiced understanding in regards to the treatment plan and agreed to  proceed.      Ernestina Columbia, MD 06/12/2022    This note may have been partially generated using MModal Fluency Direct system, and there may be some incorrect words, spellings, and punctuation that were not noted in checking the note before saving.    On the day of the encounter, a total of  *** minutes was spent on this patient encounter including review of historical information, examination, documentation and post-visit activities.

## 2022-06-13 ENCOUNTER — Telehealth (HOSPITAL_BASED_OUTPATIENT_CLINIC_OR_DEPARTMENT_OTHER): Payer: Self-pay | Admitting: Student in an Organized Health Care Education/Training Program

## 2022-06-13 ENCOUNTER — Other Ambulatory Visit (HOSPITAL_BASED_OUTPATIENT_CLINIC_OR_DEPARTMENT_OTHER): Payer: Self-pay | Admitting: NEPHROLOGY

## 2022-06-13 ENCOUNTER — Encounter (HOSPITAL_BASED_OUTPATIENT_CLINIC_OR_DEPARTMENT_OTHER): Payer: Self-pay | Admitting: Student in an Organized Health Care Education/Training Program

## 2022-06-13 MED ORDER — CIPROFLOXACIN 250 MG TABLET
250.0000 mg | ORAL_TABLET | Freq: Two times a day (BID) | ORAL | 0 refills | Status: AC
Start: 2022-06-13 — End: 2022-06-16

## 2022-06-13 NOTE — Telephone Encounter (Signed)
Attempted to contact patient to notify them that their PFT is scheduled at [facility] on 06/28/2022 at 9 am. No answer, left a voicemail requesting a return phone call. Larrie Kass, LPN  2/50/5397, 10:08

## 2022-06-14 LAB — URINE CULTURE,ROUTINE: URINE CULTURE: 100000 — AB

## 2022-06-18 ENCOUNTER — Encounter (HOSPITAL_BASED_OUTPATIENT_CLINIC_OR_DEPARTMENT_OTHER): Payer: Self-pay

## 2022-06-18 ENCOUNTER — Inpatient Hospital Stay (HOSPITAL_COMMUNITY)
Admission: RE | Admit: 2022-06-18 | Discharge: 2022-06-18 | Disposition: A | Discharge status: Home / Self Care | Payer: Medicare PPO | Source: Ambulatory Visit | Attending: Student in an Organized Health Care Education/Training Program | Admitting: Student in an Organized Health Care Education/Training Program

## 2022-06-18 ENCOUNTER — Ambulatory Visit: Payer: Medicare PPO

## 2022-06-18 ENCOUNTER — Other Ambulatory Visit: Payer: Self-pay

## 2022-06-18 VITALS — BP 164/82 | HR 54 | Temp 97.0°F | Resp 16 | Ht 65.0 in | Wt 208.6 lb

## 2022-06-18 DIAGNOSIS — R1319 Other dysphagia: Secondary | ICD-10-CM

## 2022-06-18 DIAGNOSIS — Z800 Family history of malignant neoplasm of digestive organs: Secondary | ICD-10-CM

## 2022-06-18 DIAGNOSIS — R933 Abnormal findings on diagnostic imaging of other parts of digestive tract: Secondary | ICD-10-CM

## 2022-06-18 DIAGNOSIS — R197 Diarrhea, unspecified: Secondary | ICD-10-CM | POA: Insufficient documentation

## 2022-06-18 DIAGNOSIS — K319 Disease of stomach and duodenum, unspecified: Secondary | ICD-10-CM | POA: Insufficient documentation

## 2022-06-18 DIAGNOSIS — K222 Esophageal obstruction: Secondary | ICD-10-CM | POA: Insufficient documentation

## 2022-06-18 DIAGNOSIS — Z86010 Personal history of colonic polyps: Secondary | ICD-10-CM

## 2022-06-18 DIAGNOSIS — K449 Diaphragmatic hernia without obstruction or gangrene: Secondary | ICD-10-CM | POA: Insufficient documentation

## 2022-06-18 DIAGNOSIS — R1310 Dysphagia, unspecified: Secondary | ICD-10-CM | POA: Insufficient documentation

## 2022-06-18 DIAGNOSIS — K219 Gastro-esophageal reflux disease without esophagitis: Secondary | ICD-10-CM

## 2022-06-18 LAB — POC ISTAT CREATININE (RESULT): CREATININE, POC: 1.4 mg/dL (ref 0.00–1.70)

## 2022-06-18 MED ORDER — SODIUM CHLORIDE 0.9 % INJECTION SOLUTION
10.0000 mL | INTRAMUSCULAR | Status: DC
Start: 2022-06-18 — End: 2022-06-19

## 2022-06-18 MED ORDER — OMEPRAZOLE 20 MG CAPSULE,DELAYED RELEASE
20.0000 mg | DELAYED_RELEASE_CAPSULE | Freq: Every day | ORAL | 1 refills | Status: DC
Start: 2022-06-18 — End: 2022-09-26

## 2022-06-18 MED ORDER — DIATRIZOATE MEGLUMINE-DIATRIZOATE SODIUM 66 %-10 % ORAL SOLUTION
15.0000 mL | Freq: Once | ORAL | Status: AC | PRN
Start: 2022-06-18 — End: 2022-06-18
  Administered 2022-06-18: 15 mL via ORAL

## 2022-06-18 MED ORDER — IOPAMIDOL 300 MG IODINE/ML (61 %) INTRAVENOUS SOLUTION
80.0000 mL | INTRAVENOUS | Status: AC
Start: 2022-06-18 — End: 2022-06-18
  Administered 2022-06-18: 80 mL via INTRAVENOUS

## 2022-06-18 NOTE — Progress Notes (Signed)
[facility]                                                      Department of Gastroenterology  [address]  [place], [place] [address]    Date:   06/18/2022  Name: Joanna Black  Age: 76 y.o.    Referring Provider:    Genene Churn, DO  [address]  [place],  [place] [address]    Primary Care Provider:    Genene Churn, DO    Chief Complaint:  History of esophageal stricture, worsening dysphagia, GERD, history of colon polyps    History of Present Illness  The history is obtained from the patient and chart reveiw  Ms.Joanna Black is a pleasant 76 y.o. female with past medical history of cholecystectomy, colon polyps, chronic diarrhea, gastroesophageal reflux disease, esophageal stricture with dilatation, coronary artery disease, hypertension, hyperlipidemia, arthritis, obesity, diverticulosis      Clinic visit  04-19-2022   =================  Patient is here for new visit   Patient complaining of worsening dysphagia, she has history of esophageal stricture with previous dilatation and [facility], she states that she did not have dilatation to the maximum because unavailability of larger sized balloons   She is unable to tolerate liquids or solids recently, everything gets stuck in the chest  She has history of colon polyps, colonoscopy long time ago, no report available, due for surveillance  No GI cancer in family    She has cholecystectomy before, chronic diarrhea mainly after eating, previously treated with colestipol with partial improvement  She has heartburn, taking famotidine, her PCP switch PPI to famotidine due to concern for side effects, she has worsening symptoms and indigestion  No NSAIDs use  No smoking or drinking    The patient denies any abdominal pain, nausea, vomiting, abdominal bloating, constipation,  rectal bleeding, melena, hematemesis, unintentional weight loss or other complaints.    Clinic Visit 06/15/2022:  Ms. [name] is here today for follow up s/p EGD as discussed  below. She is doing better and currently denies any GI concerns. She still experiences postprandial dirrhea. Pt states she was on cholestyramine which then caused constipation.  Patient stopped taking cholestyramine due to constipation and also disliking the taste of it.  GERD is improved with famotidine.  Patient states it was better controlled with omeprazole in the past.  She has CT AP scheduled later today to evaluate duodenal segment epithelial lesion.  This was evaluated recently by EUS which was unremarkable for any malignant cells, CT AP was then ordered.    Review of Systems  See HPI.  No chest pain or sob  No fever or chills     Past Medical History: She  has a past medical history of Arthritis, Back problem, BMI 35.0-35.9,adult (02/16/2020), CAD (coronary artery disease), Cataracts, bilateral, CHF (congestive heart failure) (CMS HCC), Chronic bronchitis with emphysema (CMS HCC), Chronic low back pain, Claustrophobia, Depression, Diabetes (CMS HCC), Diabetes mellitus, type 2 (CMS HCC), Esophageal reflux, Essential hypertension (01/28/2019), Hypercholesterolemia, Hypertension, Hypertriglyceridemia, and Type 2 diabetes mellitus (CMS HCC).    She has no past medical history of Breast CA (CMS HCC), Cancer (CMS HCC), Cervical cancer (CMS HCC), Colon cancer (CMS HCC), Endometrial cancer (CMS HCC), Ovarian cancer (CMS HCC), Treatment, or Uterine cancer (CMS HCC).  Past Surgical History: She  has a past surgical history that includes hx hysterectomy; hx carpal tunnel release; hx tubal ligation; hx gall bladder surgery/chole; hx vein stripping; hx exposure to metal shavings; Esophagogastroduodenoscopy; colonoscopy; hx tah and bso; hx heart catheterization; Cataract extraction, bilateral (Bilateral); hx cataract removal (Right, 12/30/2019); hx cataract removal (Left, 01/06/2020); and hx oophorectomy.    Social History: Her  reports that she has never smoked. She has never been exposed to tobacco smoke. She has  never used smokeless tobacco. She reports that she does not drink alcohol and does not use drugs., ,     Family History: She family history includes Bone cancer in her father; Diabetes in her multiple family members; Hypertension (High Blood Pressure) in her multiple family members; No Known Problems in her mother; Stomach Cancer in her paternal grandmother.    Allergies: She is allergic to amoxicillin, augmentin [amoxicillin-pot clavulanate], and statins-hmg-coa reductase inhibitors.    Home Medication:   Current Outpatient Medications   Medication Sig    acetaminophen (TYLENOL) 325 mg Oral Tablet Take 1 Tablet (325 mg total) by mouth Every night as needed Takes 2 at night    albuterol sulfate (PROVENTIL OR VENTOLIN OR PROAIR) 90 mcg/actuation Inhalation oral inhaler Take 1-2 Puffs by inhalation Every 6 hours as needed    albuterol sulfate (PROVENTIL) 2.5 mg /3 mL (0.083 %) Inhalation nebulizer solution Take 3 mL (2.5 mg total) by nebulization Every 4 hours as needed for Wheezing    aspirin (ECOTRIN) 81 mg Oral Tablet, Delayed Release (E.C.) Take 1 Tablet (81 mg total) by mouth Once a day    calcium carbonate/vitamin D3 (VITAMIN D-3 ORAL) Take by mouth Once a day    famotidine (PEPCID) 40 mg Oral Tablet TAKE 1 TABLET BY MOUTH EVERY DAY IN THE EVENING    FLUoxetine (PROZAC) 20 mg Oral Capsule TAKE 1 CAPSULE BY MOUTH EVERY DAY (Patient taking differently: Every night)    fluticasone propion-salmeteroL (ADVAIR) 500-50 mcg/dose Inhalation oral diskus inhaler Take 1 Puff (1 INHALATION  total) by inhalation Twice daily    Irbesartan (AVAPRO) 75 mg Oral Tablet TAKE 1 TABLET BY MOUTH EVERY DAY (STOP LOSARTAN/HCTZ) (Patient taking differently: Every evening)    metoprolol succinate (TOPROL-XL) 25 mg Oral Tablet Sustained Release 24 hr TAKE 1 TABLET BY MOUTH EVERY DAY AT NIGHT    predniSONE (DELTASONE) 20 mg Oral Tablet Take 2 Tablets (40 mg total) by mouth Once a day    tiZANidine (ZANAFLEX) 4 mg Oral Tablet TAKE 1 TABLET  THREE TIMES A DAY AS NEEDED FOR MUSCLE CRAMPS Strength: 4 mg    TRULICITY [id number] mL Subcutaneous Pen Injector INJECT 0.5 ML (0.75 MG) UNDER  THE SKIN EVERY 7 DAYS       Examination:  BP (!) 164/82   Pulse 54   Temp 36.1 C (97 F) (Temporal)   Resp 16   Ht 1.651 m (5\' 5" )   Wt 94.6 kg (208 lb 8.9 oz)   SpO2 99%   BMI 34.71 kg/m        Wt Readings from Last 3 Encounters:   06/18/22 94.6 kg (208 lb 8.9 oz)   06/12/22 95 kg (209 lb 7 oz)   06/12/22 95 kg (209 lb 7 oz)       General: Resting comfortably, no acute distress.  Eyes: Conjunctiva normal, sclera non-icteric.  HENT: Atraumatic and normocephalic.   Neck:  Supple.   Lungs: Breathing comfortably.  No respiratory distress.  Abdomen: soft lax,  not tender   Extremities: No cyanosis or edema  Neurologic: Alert, Oriented, Grossly normal  Psychiatric: Appears normal    Data/Chart/Labs/Imaging reviewed:  -CBC in April 2023 with WBC 9.2, hemoglobin 13.1, platelet 219  -BMP in April 2023 with creatinine 1.44, otherwise unremarkable  -liver panel in December 2022 normal    -vitamin-D in April 2023 was 41.5  -A1c in March 2023 was 5.9  -EGD in 2020 showed esophageal stricture, dilated distal middle and proximal esophagus, gastritis, lipoma of the 2nd part of duodenum, no pathology available, report not mentionening  the size of dilatation  -colonoscopy in 2018 with diverticulosis, biopsy taken to rule out microscopic colitis, no pathology available    Fluoro esophagram barium swallow 04/25/2022:  Small hiatal hernia, with Schatzki ring measuring 1.0 cm in diameter.    EUS 05/10/2022:  Hypoechoic lesion 2nd portion of the duodenum.  This is in the submucosa, but, focally there is invasion of the muscularis propriae.  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  Biopsied.  Pathology:  Duodenal nodule.  Satisfactory for evaluation.  Negative for malignant cells.    EGD on 06/05/2022:  Normal upper 3rd of esophagus.  Normal middle 3rd of esophagus.  Normal lower 3rd of esophagus.  Low-grade  or narrowing Schatzki ring.  Dilated from 18-19 mm with significant resistance at 19 mm.  Z-line regular, 34 cm from incisors.  No esophagitis.  3 cm hiatal hernia.  Erythematous mucosa in the antrum.  No specimens collected.  Hill grade 2 hiatal hernia.  Normal duodenal bulb.  Duodenal subepithelial lesion.  This was evaluated by EUS pending CT abdomen scheduled 06/18/2022.  _________________________________________________________________________  Assessment and Plan  KEIRSTAN MATSUYAMA is a 76 y.o. female with the following issues:    Dysphagia to solid and liquid resolved s/p dilation. EGD on 05/2022 showed Schatzki ring.  Hiatal hernia  History of esophageal stricture status post dilatation in [place]  Gastroesophageal reflux disease, improved with famotidine.  Patient states she gets more relief from PPIs.  History of colon polyps long time ago, due for surveillance.   Postprandial diarrhea, history of cholecystectomy, possible bile salt diarrhea, partial improvement with colestipol.  Patient stopped taking cholestyramine due to constipation and the taste of the medication.  Colonic diverticulosis without complication  Medium-sized subepithelial lesion with no bleeding was found in the 2nd portion of the duodenum.  This was recently evaluated by EUS which was negative for malignant cells.  CT abdomen was then scheduled to further evaluate this lesion.  Scheduled for 06/18/2022.     My recommendations are as follows:    -will proceed with colonoscopy.  Scheduled.  -CT abdomen to be done later today.  -will screen for celiac disease, tTG IgA, total IgA level  -continue cholestyramine 4 g  as needed with meals for possible bile salt.  Advised patient to take half a pack once a day if causing constipation.    --Avoid NSAIDs  -Anti-reflux measures discussed with patient.   -Avoid triggering foods  -Elevate head of bed at night or use more pillows  -Avoid eating 2-3 hours prior to bedtime   -start  omeprazole 20 mg p.o. daily.  Take it 30 minutes before breakfast.  -May take famotidine in the evening at [facility] as needed for uncontrolled heartburn.  -Recommend weight loss of at least 10 % of current body weight   -Small bites, Small sips,  Swallow each bite/sip before taking next swallow and Remain upright after meals for  30 minutes after  eating, slow rate of intake. Recommend to take his medication with  puree or pudding consistency if having difficulty swallowing medications   -Recommended to eat a high-fiber diet with fresh fruits and vegetables.  Suggested to increase fluid intake and to drink more water when eating more fiber.  Also suggested regular exercise.  -recommend Metamucil BID    The patient was given the opportunity to ask questions and those questions were appropriately answered. She agreed with the treatment plan and is encouraged to call with any additional questions or concerns.     Return to the clinic in 2 weeks post colonoscopy.    Orders Placed This Encounter    TISSUE TRANSGLUTAMINASE (TTG) ANTIBODY, IGA, SERUM    IMMUNOGLOBULIN A (IGA), SERUM    omeprazole (PRILOSEC) 20 mg Oral Capsule, Delayed Release(E.C.)         Quentin Mulling, APRN,NP-C  [facility]  [place], [place]  [address]  Attending attestation    I did not personally see or examine the patient. I reviewed the key elements of the HPI, PMH, social history, family history and ROS done by Quentin Mulling, APRN,NP-C.  The assessment, plan of care, and recommendations were reviewed. I agree with the above assessment and plan with additions as mentioned below.    Joslyn Devon, MD  [facility]  [place], [place] [address]        Note: A portion of this documentation was generated using MMODAL (voice recognition software) and may contain syntax/voice recognition errors.

## 2022-06-18 NOTE — Nursing Note (Signed)
Patient here for follow up visit with no complaints

## 2022-06-23 ENCOUNTER — Other Ambulatory Visit (INDEPENDENT_AMBULATORY_CARE_PROVIDER_SITE_OTHER): Payer: Self-pay | Admitting: Physician Assistant

## 2022-06-28 ENCOUNTER — Inpatient Hospital Stay
Admission: RE | Admit: 2022-06-28 | Discharge: 2022-06-28 | Disposition: A | Discharge status: Home / Self Care | Payer: Medicare PPO | Source: Ambulatory Visit | Attending: Student in an Organized Health Care Education/Training Program | Admitting: Student in an Organized Health Care Education/Training Program

## 2022-06-28 ENCOUNTER — Other Ambulatory Visit: Payer: Self-pay

## 2022-06-28 DIAGNOSIS — R942 Abnormal results of pulmonary function studies: Secondary | ICD-10-CM

## 2022-06-28 DIAGNOSIS — J4540 Moderate persistent asthma, uncomplicated: Secondary | ICD-10-CM | POA: Insufficient documentation

## 2022-06-28 MED ORDER — ALBUTEROL SULFATE 2.5 MG/3 ML (0.083 %) SOLUTION FOR NEBULIZATION
2.5000 mg | INHALATION_SOLUTION | Freq: Once | RESPIRATORY_TRACT | Status: AC
Start: 2022-06-28 — End: 2022-06-28
  Administered 2022-06-28: 2.5 mg via RESPIRATORY_TRACT

## 2022-07-01 ENCOUNTER — Ambulatory Visit (HOSPITAL_BASED_OUTPATIENT_CLINIC_OR_DEPARTMENT_OTHER): Payer: Self-pay | Admitting: Pulmonary Disease

## 2022-07-01 NOTE — Procedures (Signed)
Pulmonary/Critical Care/Sleep  [address]  [place] [address]  Phone: [phone]  Fax: [phone]    NAME: Joanna Black  DOB:  01-03-1946  AGE:  76 y.o.  MRN:  [id number]        Pulmonary function tests    Test performed:  Spirometry, lung volumes, diffusion capacity  Technician comments: Good patient effort and cooperation.  This test meets ATS criteria for repeatability and reproducibility    Spirometry:  FVC:  Pre:  96% predicted ; Post:  102% predicted  FEV1:  Pre:  64% predicted ; Post:  66% predicted  FEV1/FVC Ratio:  51    Flow-Volume Loop:  Suggestive of obstruction    Lung Volumes:     TLC:  107% predicted    Diffusion Capacity:  DLCO:   61% predicted      Summary:   The FVC is normal.  The FEV1 and FEV1/FVC are reduced. There is no significant response to inhaled bronchodilator. The TLC is normal. The diffusing capacity is decreased.      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  The decreased diffusion capacity could be suggestive of emphysema with the presence of obstructive component. Clinical correlation is advised.      Please review the media scanned document for further details.    Ernestina Columbia, MD

## 2022-07-02 ENCOUNTER — Telehealth (HOSPITAL_BASED_OUTPATIENT_CLINIC_OR_DEPARTMENT_OTHER): Payer: Self-pay | Admitting: Student in an Organized Health Care Education/Training Program

## 2022-07-02 NOTE — Telephone Encounter (Addendum)
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  Continue current inhalers.  Please inform the patient.

## 2022-07-04 ENCOUNTER — Ambulatory Visit (INDEPENDENT_AMBULATORY_CARE_PROVIDER_SITE_OTHER): Payer: Self-pay | Admitting: Family Medicine

## 2022-07-04 DIAGNOSIS — J069 Acute upper respiratory infection, unspecified: Secondary | ICD-10-CM

## 2022-07-04 MED ORDER — AZITHROMYCIN 250 MG TABLET
ORAL_TABLET | ORAL | 0 refills | Status: DC
Start: 2022-07-04 — End: 2022-08-28

## 2022-07-04 NOTE — Telephone Encounter (Signed)
Patients wife called and said that she is starting to get a sore throat, similar to what her husband has, and wanted to know if PCP could call in medication.  Hal Morales, [place] 07/04/2022 12:45

## 2022-07-04 NOTE — Telephone Encounter (Signed)
Attempted to call patient; no answer, left message informing patient that order was placed for abx at patients' pharmacy.  Hal Morales, [place] 07/04/2022 13:47

## 2022-07-04 NOTE — Telephone Encounter (Signed)
Please make patient aware order placed for Abx. to patient's pharmacy. Thank you.

## 2022-08-27 ENCOUNTER — Other Ambulatory Visit: Payer: Self-pay

## 2022-08-27 ENCOUNTER — Ambulatory Visit (INDEPENDENT_AMBULATORY_CARE_PROVIDER_SITE_OTHER): Payer: Medicare PPO | Admitting: Physician Assistant

## 2022-08-27 ENCOUNTER — Telehealth (INDEPENDENT_AMBULATORY_CARE_PROVIDER_SITE_OTHER): Payer: Self-pay | Admitting: Physician Assistant

## 2022-08-27 ENCOUNTER — Encounter (INDEPENDENT_AMBULATORY_CARE_PROVIDER_SITE_OTHER): Payer: Self-pay | Admitting: Physician Assistant

## 2022-08-27 VITALS — BP 140/78 | HR 67 | Ht 65.0 in | Wt 211.0 lb

## 2022-08-27 DIAGNOSIS — I10 Essential (primary) hypertension: Secondary | ICD-10-CM

## 2022-08-27 DIAGNOSIS — Z789 Other specified health status: Secondary | ICD-10-CM

## 2022-08-27 DIAGNOSIS — I779 Disorder of arteries and arterioles, unspecified: Secondary | ICD-10-CM

## 2022-08-27 DIAGNOSIS — I110 Hypertensive heart disease with heart failure: Secondary | ICD-10-CM

## 2022-08-27 DIAGNOSIS — N1831 Chronic kidney disease, stage 3a (CMS HCC): Secondary | ICD-10-CM

## 2022-08-27 DIAGNOSIS — I5030 Unspecified diastolic (congestive) heart failure: Secondary | ICD-10-CM

## 2022-08-27 DIAGNOSIS — E782 Mixed hyperlipidemia: Secondary | ICD-10-CM

## 2022-08-27 MED ORDER — AMLODIPINE 2.5 MG TABLET
2.5000 mg | ORAL_TABLET | Freq: Every day | ORAL | 3 refills | Status: DC
Start: 2022-08-27 — End: 2022-11-01

## 2022-08-27 NOTE — Telephone Encounter (Signed)
Patient is seen today in clinic.  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  We had tried to proceed with CTA head and neck in the past, but patient's renal function would not allow this.  She did see Nephrology in June of 2023.  They recommended that if contrast was needed for CT scan, recommend proceeding with pre and post IV hydration.    Do you mind to touch base with patient and see if CTA head/neck would be something that she would be interested in?  If so, would we be able to do IV fluids pre and post CT scan at [facility]?     Alternative option, would be to repeat carotid artery ultrasound in January of 2024.

## 2022-08-27 NOTE — Patient Instructions (Addendum)
1.  Start Norvasc/amlodipine 2.5mg  once daily.  2.  Continue other blood pressure medications including irbesartan/avapro 70 mg once daily and metoprolol succinate/Toprol XL 25 mg once  daily.   3.  Continue all other  medications as prescribed.  4.  Please monitor blood pressure at home and keep a log.  5.  Goal blood pressure is consistently lower  than 140/80, approximately 120-130/80's or a bit lower.  6.  Please call and let our office know if blood pressure is above this range, with addition of new medication, or if you experience adverse side effects from this medication.   7.  Otherwise, follow-up in 6 months or always sooner if needed.   8.  Please do not hesitate to call with any further questions/concerns.  9.  Always seek immediate medical attention if feeling too poorly for any reason.

## 2022-08-27 NOTE — Progress Notes (Signed)
[facility]  [address]  [place], [place] [address]  Phone:  [phone]      CARDIOLOGY FOLLOW-UP    Name: Joanna Black                       Date of Birth: 1946/04/19   MRN:  [id number]                         Date of visit: 08/27/2022     PCP: Joanna Parr, DO     Chief Complaint    Follow Up 6 Months; Hyperlipidemia; Hypertension; CHF       Subjective  Joanna Black is a 76 y.o. year old female who presents for Follow Up 6 Months, Hyperlipidemia, Hypertension, and CHF   to clinic.  She has a past medical history of GERD, anxiety/depression, hypertension, chronic kidney disease stage IIIB, HFpEF, hyperlipidemia with statin intolerance, and diabetes.  She denies history of known coronary artery disease.  No history of cardiac stents or bypass.  No history of heart attack.  No history of stroke.  No history of rheumatic fever or scarlet fever.  No history of liver disease.  In regards to her social history, no tobacco use, alcohol use, or illicit drug use.  In regards to her family history she has 2 brothers both with history of coronary artery disease.  One brother has history of heart attack and another brother with history of cardiac stents.  She does have drug allergies to multiple statins causing myalgias.  She has tried multiple statins in the past and cannot tolerate these.  At 1 point she was on pravastatin in September 2015.  Otherwise, she cannot recall the other means of the statins that she has tried.  She also has drug allergies to Augmentin causing nausea and vomiting and amoxicillin.     Her most recent cardiac testing includes stress test performed 01/02/2022.  At that time, she had no stress-induced ischemia.  Ejection fraction calculated at 60 percent.  Echocardiogram performed 12/31/2021 noted a small left ventricle, ejection fraction of 55-60 percent, mild TR, mild MR, trace AR, and mild PVR.  Most recent carotid artery ultrasound performed 12/25/2021.  At that time she  had less than 50 percent bilateral carotid artery disease.  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  CTA of the head and neck was ordered.  She notes that she did try to get this done on 2 separate occasions, but her kidney function was too poor for them to do this study.     She notes that she is a history of CHF.  Approximately 6-7 years ago she was in [place].  She notes they would stay there in the winters.  She began having shortness of breath and her oxygen saturation was low.  She was also with a lot of fluid on board.  She notes she was hospitalized for about 3 days and was treated with medications for breathing improvement and fluid removal.     She was seen in the ER 11/30/2021 for chest discomfort.  Negative troponin x1.  It was deemed this chest discomfort was noncardiac in nature, probable costochondritis.    Patient presents today for routine six-month cardiac follow-up.  From a cardiac standpoint, she is doing well.  No complaints of chest pain or discomfort.  She does follow with Nephrology for underlying  chronic kidney disease.  Intake blood pressure 166/80.  Retake 140/78.  She notes that she has not been monitoring her blood pressure routinely at home, but states that it does run a little bit higher at home she believes.  She does have documented statin intolerance.  Recent lab work as below.  Further review of systems as below.    Recent Lab Results:  Last CBC  (Last result in the past 2 years)        WBC   HGB   HCT   MCV   Platelets      04/09/22 1239 9.2   13.1   40.3   99.8   219            Last BMP  (Last result in the past 2 years)        Na   K   Cl   CO2   BUN   Cr   Calcium   Glucose   Glucose-Fasting        04/09/22 1239 140   4.8   104   27   20   1.44   9.5   98               Last Hepatic Panel  (Last result in the past 2 years)        Albumin   Total PTN   Total Bili   Direct Bili   Ast/SGOT   Alt/SGPT   Alk Phos        04/09/22 1239  3.8                         Lab Results   Component Value Date    TSH 3.200 01/29/2019     Lab Results   Component Value Date    TRIG 200 (H) 11/29/2021    HDLCHOL 57 11/29/2021    LDLCHOL 124 (H) 11/29/2021    CHOLESTEROL 221 (H) 11/29/2021     HEMOGLOBIN A1C   Date Value Ref Range Status   03/13/2022 5.9  Final   11/29/2021 6.3 (H) 4.8 - 6.2 % Final   11/29/2020 6.5 (H) 4.8 - 6.2 % Final   01/19/2020 6.6 (H) 4.8 - 6.2 % Final   09/06/2019 7.0 (H) 4.8 - 6.2 % Final     Magnesium level 04/09/2022:  2.1.    Patient Active Problem List    Diagnosis Date Noted    History of cholecystectomy 04/21/2022    History of colon polyps 04/21/2022    Colon cancer screening 04/21/2022    Gastroesophageal reflux disease 04/21/2022    Dysphagia 04/21/2022    Esophageal stricture 04/21/2022    Chronic diarrhea 04/21/2022    Diverticulosis of colon 04/21/2022    CKD (chronic kidney disease) stage 3, 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/min (CMS HCC) 11/29/2021    Mixed hyperlipidemia 04/27/2020     She is willing to try a non-statin RX for her lipids.    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    Chronic systolic congestive heart failure (CMS HCC) 03/21/2016  Chronic GERD 02/20/2016      Past Medical History:   Diagnosis Date    Arthritis     Back problem     BMI 35.0-35.9,adult 02/16/2020    CAD (coronary artery disease)     mild    Cataracts, bilateral     CHF (congestive heart failure) (CMS HCC)     Chronic bronchitis with emphysema (CMS HCC)     Chronic low back pain     Claustrophobia     Depression     Diabetes (CMS HCC)     Diabetes mellitus, type 2 (CMS HCC)     Esophageal reflux     Essential hypertension 01/28/2019     Hypercholesterolemia     Hypertension     Hypertriglyceridemia     Type 2 diabetes mellitus (CMS HCC)      Allergies   Allergen Reactions    Amoxicillin     Augmentin [Amoxicillin-Pot Clavulanate] Nausea/ Vomiting    Statins-Hmg-Coa Reductase Inhibitors Myalgia     MEDICATIONS  Outpatient Medications Prior to Visit   Medication Sig Dispense Refill    acetaminophen (TYLENOL) 325 mg Oral Tablet Take 1 Tablet (325 mg total) by mouth Every night as needed Takes 2 at night      albuterol sulfate (PROVENTIL OR VENTOLIN OR PROAIR) 90 mcg/actuation Inhalation oral inhaler Take 1-2 Puffs by inhalation Every 6 hours as needed 1 Each 3    albuterol sulfate (PROVENTIL) 2.5 mg /3 mL (0.083 %) Inhalation nebulizer solution Take 3 mL (2.5 mg total) by nebulization Every 4 hours as needed for Wheezing 90 Each 3    aspirin (ECOTRIN) 81 mg Oral Tablet, Delayed Release (E.C.) Take 1 Tablet (81 mg total) by mouth Once a day      azithromycin (ZITHROMAX) 250 mg Oral Tablet Take 500 mg (2 tab) on day 1; take 250 mg (1 tab) on days 2-5. 6 Tablet 0    calcium carbonate/vitamin D3 (VITAMIN D-3 ORAL) Take by mouth Once a day      famotidine (PEPCID) 40 mg Oral Tablet TAKE 1 TABLET BY MOUTH EVERY DAY IN THE EVENING (Patient not taking: Reported on 08/27/2022) 90 Tablet 0    FLUoxetine (PROZAC) 20 mg Oral Capsule TAKE 1 CAPSULE BY MOUTH EVERY DAY 90 Capsule 1    fluticasone propion-salmeteroL (ADVAIR) 500-50 mcg/dose Inhalation oral diskus inhaler Take 1 Puff (1 INHALATION  total) by inhalation Twice daily (Patient not taking: Reported on 08/27/2022) 3 Each 3    Irbesartan (AVAPRO) 75 mg Oral Tablet TAKE 1 TABLET BY MOUTH EVERY DAY (STOP LOSARTAN/HCTZ) (Patient taking differently: Every evening) 90 Tablet 1    metoprolol succinate (TOPROL-XL) 25 mg Oral Tablet Sustained Release 24 hr TAKE 1 TABLET BY MOUTH EVERY DAY AT NIGHT 90 Tablet 1    omeprazole (PRILOSEC) 20 mg Oral Capsule, Delayed Release(E.C.) Take 1 Capsule (20 mg total) by mouth Once a  day for 180 days 90 Capsule 1    predniSONE (DELTASONE) 20 mg Oral Tablet Take 2 Tablets (40 mg total) by mouth Once a day 10 Tablet 0    tiZANidine (ZANAFLEX) 4 mg Oral Tablet TAKE 1 TABLET THREE TIMES A DAY AS NEEDED FOR MUSCLE CRAMPS Strength: 4 mg 90 Tablet 1    TRULICITY 1.95 KD/3.2 mL Subcutaneous Pen Injector INJECT 0.5 ML (0.75 MG) UNDER  THE SKIN EVERY 7 DAYS 6 mL 3     No facility-administered medications prior to visit.     REVIEW OF SYSTEMS:  General: No fever or chills.  HEENT: No vision changes or issues with bleeding from ears, nose, or throat.    Cardiac: No chest pain, tightness, heaviness, or discomfort. No jaw, back, neck, or arm pain. No palpitations.   Resp: No dyspnea at rest or with exertion. No cough, hemoptysis, or paroxysmal nocturnal dyspnea/orthopnea.   GI: No N/V/D, melena, or bright red blood per rectum.  Ext: No lower extremity edema or claudication.    Neuro: No lightheadedness, dizziness, presyncope, syncope, focal weakness, numbness, or tingling.  All other ROS negative    Objective:   BP (!) 140/78   Pulse 67   Ht 1.651 m ('5\' 5"'$ )   Wt 95.7 kg (211 lb)   SpO2 95%   BMI 35.11 kg/m        Physical Exam  Vitals and nursing note reviewed.   Constitutional:       General: She is not in acute distress.     Appearance: Normal appearance. She is not ill-appearing.      Comments: Sitting in bedside chair.   HENT:      Head: Atraumatic.   Eyes:      Conjunctiva/sclera: Conjunctivae normal.   Neck:      Vascular: No carotid bruit.   Cardiovascular:      Rate and Rhythm: Normal rate and regular rhythm.      Heart sounds: Normal heart sounds. No murmur heard.    No friction rub. No gallop.   Pulmonary:      Effort: Pulmonary effort is normal.      Breath sounds: Normal breath sounds. No wheezing, rhonchi or rales.   Musculoskeletal:      Right lower leg: No edema.      Left lower leg: No edema.   Skin:     General: Skin is warm and dry.      Findings: No bruising.   Neurological:       General: No focal deficit present.      Mental Status: She is alert and oriented to person, place, and time.   Psychiatric:         Mood and Affect: Mood normal.      Assessment/Plan  Assessment/Plan   1. Essential hypertension    2. Mixed hyperlipidemia    3. Statin intolerance    4. Heart failure with preserved ejection fraction, unspecified HF chronicity (CMS HCC)    5. Stage 3a chronic kidney disease (CMS HCC)    6. Vertebral artery disease (CMS HCC)      Orders Placed This Encounter    amLODIPine (NORVASC) 2.5 mg Oral Tablet      1. Hypertension.  Intake blood pressure 166/80.  Retake 140/78.  She believes her blood pressure does run a little bit high at home.   - We will add Norvasc 2.5 mg once daily.  Chart review notes that she was previously on this in 2019, but it was discontinued in July 2020 with discontinuation reason listed as "side effects." At that time, she was on 5 mg once daily.  We will attempt a lower dose of Norvasc.  She is to let us know if any adverse side effects with this medication.   - Continue irbesartan 75 mg once daily and Toprol-XL 25 mg once daily.  We will make no changes to irbesartan/Arb therapy given underlying CKD.     - I did ask her to monitor her blood pressure at home, with goal blood pressure consistently < 140/90.  Goal blood pressure 120 to 130 over 80s.  She is let us know if blood pressure is continuing to be elevated with addition of Norvasc.  If so, we can make medication adjustments accordingly.     - Continue low-fat low-salt diet.    2. Hyperlipidemia; Statin intolerance.  Statin intolerance. She notes that she has tried multiple statins all which cause myalgias. She is unable to recall exact names. Documented pravastatin 08/2014.   - She is with diabetes. Recommend LDL < 70, HDL > 45, and triglycerides < 150.   - Chart review notes that she has been offered alternative statin therapy, and has deferred in the past.   - Could consider PCSK9 therapy with Repatha or  Praluent in the future if patient willing.   - Continue low fat low salt diet.    3. HFpEF.  First diagnosed approximately 6-7 years ago at outside facility in [place], of which she had to receive what sounds like IV diuretic therapy. Echo 12/31/2021 noted EF of 55-60% with no severe valvular disease. BNP 12/01/2021 normal at 43.   - She currently denies excessive fluid retention or dyspnea. No complaints of PND/PNO. She does not appear overtly fluid overloaded on exam. Lung fields clear to auscultation b/l. I do not appreciate any excessive LE swelling.   - Current GDMT with ARB/irbesartan 75 mg qDay and beta blockade/Toprol XL 25 mg qDay.   - Recommend daily weights and 2g sodium restriction diet.    4. Left vertebral artery disease.  Most recent carotid artery ultrasound performed 12/25/2021.  At that time she had less than 50 percent bilateral carotid artery disease.  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     - CTA of the head and neck was ordered.  She notes that she did try to get this done on 2 separate occasions, but her kidney function was too poor for them to do this study. She has followed with Nephrology, will attempt to get this rescheduled.    - She denies history of stroke. She did not voice any complaints/concerns of any stroke-type symptoms. No complaints of numbness, tingling, or changes in memory, speech, or gait.   - ASA 81 mg qDay.   - Statin intolerance. Consider PCSK9 therapy in the future if willing.    PLAN  Would continue to work on aggressive risk factor modification.  Patient was encouraged to call with any further questions/concerns.  Patient is to always seek immediate medical attention if feeling too poorly for any reason.  Add Norvasc 2.34m qDay.  Will attempt to reschedule CTA head and neck for more in-depth look at left vertebral artery disease. Per Nephrology OV note 05/2022, would recommend IV fluids prior to CT and post-CT scan, with close  monitoring of renal status.   She will continue to monitor BP at home, with goal BP consistently < 140/90, with goal BP closer to 120-130/80's.  She will let [facility] if BP remains high with addition of Norvasc, and we can make further recommendations accordingly. She will let [facility] if any adverse effects with Norvasc 2.567mqDay.  Otherwise, patient will follow-up with Cardiology in 6 months, or always sooner if needed.      Return in about 6 months (around 02/25/2023).       NOTE:  Please note that this Assessment/Plan is Cardiology/Heart & Vascular focused.  Further medical conditions such as CKD, HLD, anxiety/depression, diabetes, lung disease, and GERD are  as managed by PCP and other specialists.      NOTE:  This patient was seen independently in clinic by the APP.     NOTE:  A portion of this documentation was generated using MMODAL voice recognition software and may contain syntax/voice recognition errors. Any errors are not intentional. Please call with any questions/concerns.    Wanda Plump, [facility].

## 2022-08-28 ENCOUNTER — Encounter (HOSPITAL_COMMUNITY): Payer: Self-pay | Admitting: Student in an Organized Health Care Education/Training Program

## 2022-08-28 NOTE — Telephone Encounter (Signed)
Left message for pt to return call. Edsel Petrin, LPN  06/28/8241 35:36

## 2022-08-28 NOTE — Nursing Note (Signed)
COVID-19 Admission Screen    Low Risk:   Able to Provide asymptomatic History  None    Moderate/High Risk: {None    Test Result:Not Indicated

## 2022-08-29 NOTE — Telephone Encounter (Signed)
Spoke with pt today about getting a CT scan. Informed her that nephrology had approved contrast as long as she was pre and post hydrated. Pt declined to have CT done at this time. States she would rather wait until January 2024 and have a repeat CAUS. Edsel Petrin, LPN  05/01/2923 46:28

## 2022-08-31 ENCOUNTER — Other Ambulatory Visit (INDEPENDENT_AMBULATORY_CARE_PROVIDER_SITE_OTHER): Payer: Self-pay | Admitting: Physician Assistant

## 2022-08-31 DIAGNOSIS — I10 Essential (primary) hypertension: Secondary | ICD-10-CM

## 2022-09-03 ENCOUNTER — Encounter (HOSPITAL_BASED_OUTPATIENT_CLINIC_OR_DEPARTMENT_OTHER): Payer: Self-pay | Admitting: Student in an Organized Health Care Education/Training Program

## 2022-09-07 ENCOUNTER — Other Ambulatory Visit (HOSPITAL_COMMUNITY): Payer: Self-pay | Admitting: Physician Assistant

## 2022-09-07 DIAGNOSIS — N390 Urinary tract infection, site not specified: Secondary | ICD-10-CM

## 2022-09-08 ENCOUNTER — Encounter (HOSPITAL_COMMUNITY): Payer: Self-pay

## 2022-09-08 ENCOUNTER — Emergency Department
Admission: EM | Admit: 2022-09-08 | Discharge: 2022-09-08 | Disposition: A | Discharge status: Home / Self Care | Payer: Medicare PPO | Attending: Emergency Medicine | Admitting: Emergency Medicine

## 2022-09-08 ENCOUNTER — Other Ambulatory Visit: Payer: Self-pay

## 2022-09-08 ENCOUNTER — Emergency Department (HOSPITAL_COMMUNITY): Payer: Medicare PPO

## 2022-09-08 ENCOUNTER — Other Ambulatory Visit
Admission: RE | Admit: 2022-09-08 | Discharge: 2022-09-08 | Disposition: A | Discharge status: Home / Self Care | Payer: Medicare PPO | Source: Ambulatory Visit | Attending: Physician Assistant | Admitting: Physician Assistant

## 2022-09-08 DIAGNOSIS — R0789 Other chest pain: Secondary | ICD-10-CM | POA: Insufficient documentation

## 2022-09-08 DIAGNOSIS — N390 Urinary tract infection, site not specified: Secondary | ICD-10-CM | POA: Insufficient documentation

## 2022-09-08 DIAGNOSIS — J449 Chronic obstructive pulmonary disease, unspecified: Secondary | ICD-10-CM | POA: Insufficient documentation

## 2022-09-08 DIAGNOSIS — R079 Chest pain, unspecified: Secondary | ICD-10-CM

## 2022-09-08 DIAGNOSIS — I447 Left bundle-branch block, unspecified: Secondary | ICD-10-CM | POA: Insufficient documentation

## 2022-09-08 DIAGNOSIS — Z87440 Personal history of urinary (tract) infections: Secondary | ICD-10-CM | POA: Insufficient documentation

## 2022-09-08 LAB — CBC WITH DIFF
BASOPHIL #: 0 10*3/uL (ref 0.00–0.20)
BASOPHIL %: 0 % (ref 0–2)
EOSINOPHIL #: 0.2 10*3/uL (ref 0.00–4.00)
EOSINOPHIL %: 2 % (ref 0–4)
HCT: 36.1 % (ref 36.0–47.0)
HGB: 11.8 g/dL — ABNORMAL LOW (ref 12.0–15.0)
LYMPHOCYTE #: 2.5 10*3/uL (ref 1.00–3.80)
LYMPHOCYTE %: 25 % (ref 20–35)
MCH: 30.6 pg (ref 27.0–32.0)
MCHC: 32.7 g/dL (ref 32.0–36.0)
MCV: 93.6 fL (ref 80.0–100.0)
MONOCYTE #: 0.8 10*3/uL (ref 0.00–1.40)
MONOCYTE %: 7 % (ref 3–13)
MPV: 9.6 fL — ABNORMAL HIGH (ref 6.6–9.3)
NEUTROPHIL #: 6.7 10*3/uL (ref 2.40–7.60)
NEUTROPHIL %: 66 % (ref 50–70)
PLATELETS: 230 10*3/uL (ref 140–450)
RBC: 3.86 10*6/uL — ABNORMAL LOW (ref 4.20–5.40)
RDW: 14.2 % (ref 12.0–15.0)
WBC: 10.3 10*3/uL (ref 4.8–10.8)

## 2022-09-08 LAB — BASIC METABOLIC PANEL
ANION GAP: 8 mmol/L (ref 4–13)
BUN/CREA RATIO: 18 (ref 6–22)
BUN: 25 mg/dL (ref 8–25)
CALCIUM: 8.9 mg/dL (ref 8.6–10.3)
CHLORIDE: 107 mmol/L (ref 96–111)
CO2 TOTAL: 23 mmol/L (ref 23–31)
CREATININE: 1.39 mg/dL — ABNORMAL HIGH (ref 0.60–1.05)
ESTIMATED GFR: 39 mL/min/BSA — ABNORMAL LOW (ref >=60)
GLUCOSE: 141 mg/dL — ABNORMAL HIGH (ref 65–125)
POTASSIUM: 4.4 mmol/L (ref 3.5–5.1)
SODIUM: 138 mmol/L (ref 136–145)

## 2022-09-08 LAB — B-TYPE NATRIURETIC PEPTIDE (BNP),PLASMA: BNP: 152 pg/mL — ABNORMAL HIGH (ref <=99)

## 2022-09-08 LAB — TROPONIN-I: TROPONIN-I HS: <2.7 ng/L (ref <=14.0)

## 2022-09-08 NOTE — ED Nurses Note (Signed)
Discharge orders provided to patient. Verbalized understanding. No questions or concerns verbalized. Patient discharged home at this time. Patient ambulated to discharge.

## 2022-09-08 NOTE — ED Triage Notes (Addendum)
76y.o F arrived to ED with complaint of pain through her neck, shoulders and back and having SOB. Patient stated this has been going on since Monday but has continued to get worse. She reports that she ran up a hill on Monday to catch a dog when this began.

## 2022-09-08 NOTE — Discharge Instructions (Signed)
Recommend you follow with your primary care physician this week.  Continue aspirin 81 mg daily

## 2022-09-08 NOTE — ED Provider Notes (Signed)
Emergency Department  09/08/2022     Joanna Black  10/29/1946  76 y.o.  female  [place] [id number]   [phone] (home)  PCP: Genene ChurnZane Dennison, DO   Date of service:09/08/2022 18:07    Chief Complaint:   Chief Complaint   Patient presents with    Shortness of Breath    Neck Pain       Arrival: The patient arrived by Car and is alone    HPI: This is a 76 y.o. female who presents to the emergency department with complaints for the last 5 days she is had intermittent chest pain and shortness of breath.  She says that she has diffuse anterior chest pain vague description no radiation.  She says occasionally she feels some discomfort in her neck.  She says it started after walking up a hill a week ago.  It has been relatively constant since then.  She was actually seen yesterday at an urgent care for urinary symptoms and treated for urinary tract infection.  She said she did not mention her chest discomfort.  She denies history of cardiac disease.  She denies recent fever chill or cough.  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            Past Medical History:   Past Medical History:   Diagnosis Date    Arthritis     Back problem     BMI 35.0-35.9,adult 02/16/2020    CAD (coronary artery disease)     mild    Cataracts, bilateral     CHF (congestive heart failure) (CMS HCC)     Chronic bronchitis with emphysema (CMS HCC)     Chronic low back pain     Claustrophobia     Depression     Diabetes (CMS HCC)     Diabetes mellitus, type 2 (CMS HCC)     Esophageal reflux     Essential hypertension 01/28/2019    Hypercholesterolemia     Hypertension     Hypertriglyceridemia     Type 2 diabetes mellitus (CMS HCC)        Past Surgical History:   Past Surgical History:   Procedure Laterality Date    Cataract extraction, bilateral Bilateral     Colonoscopy      Esophagogastroduodenoscopy      Hx carpal tunnel release      Hx cataract removal Right 12/30/2019    Hx cataract removal Left 01/06/2020    Hx cholecystectomy      Hx  exposure to metal shavings      Hx heart catheterization      Hx hysterectomy      Hx oophorectomy      Hx tah and bso      Hx tubal ligation      Hx vein stripping         Social History:   Social History     Tobacco Use    Smoking status: Never     Passive exposure: Never    Smokeless tobacco: Never   Vaping Use    Vaping Use: Never used   Substance Use Topics    Alcohol use: No     Alcohol/week: 0.0 standard drinks    Drug use: No        Family History:  Family History   Problem Relation Age of Onset    No Known Problems Mother     Bone cancer Father  Diabetes Multiple family members     Hypertension (High Blood Pressure) Multiple family members     Stomach Cancer Paternal Grandmother            Medications Prior to Admission       Prescriptions    acetaminophen (TYLENOL) 500 mg Oral Tablet    Take 1 Tablet (500 mg total) by mouth Every night as needed Takes 2 at night    albuterol sulfate (PROVENTIL OR VENTOLIN OR PROAIR) 90 mcg/actuation Inhalation oral inhaler    Take 1-2 Puffs by inhalation Every 6 hours as needed    albuterol sulfate (PROVENTIL) 2.5 mg /3 mL (0.083 %) Inhalation nebulizer solution    Take 3 mL (2.5 mg total) by nebulization Every 4 hours as needed for Wheezing    amLODIPine (NORVASC) 2.5 mg Oral Tablet    Take 1 Tablet (2.5 mg total) by mouth Once a day    aspirin (ECOTRIN) 81 mg Oral Tablet, Delayed Release (E.C.)    Take 1 Tablet (81 mg total) by mouth Once a day    calcium carbonate/vitamin D3 (VITAMIN D-3 ORAL)    Take 2,000 Int'l Units/day by mouth Once a day    ezetimibe (ZETIA) 10 mg Oral Tablet    Take 1 Tablet (10 mg total) by mouth Every evening    FLUoxetine (PROZAC) 20 mg Oral Capsule    TAKE 1 CAPSULE BY MOUTH EVERY DAY    Irbesartan (AVAPRO) 75 mg Oral Tablet    TAKE 1 TABLET BY MOUTH EVERY DAY (STOP LOSARTAN/HCTZ)    metoprolol succinate (TOPROL-XL) 25 mg Oral Tablet Sustained Release 24 hr    TAKE 1 TABLET BY MOUTH EVERY DAY AT NIGHT    nitrofurantoin monohyd/m-cryst  (MACROBID) 100 mg Oral Capsule    Take 1 Capsule (100 mg total) by mouth Twice daily For 5 days    omeprazole (PRILOSEC) 20 mg Oral Capsule, Delayed Release(E.C.)    Take 1 Capsule (20 mg total) by mouth Once a day for 180 days    tiZANidine (ZANAFLEX) 4 mg Oral Tablet    TAKE 1 TABLET THREE TIMES A DAY AS NEEDED FOR MUSCLE CRAMPS Strength: 4 mg    TRULICITY 0.75 mg/0.5 mL Subcutaneous Pen Injector    INJECT 0.5 ML (0.75 MG) UNDER  THE SKIN EVERY 7 DAYS    Patient taking differently:  sundays            Allergies:   Allergies   Allergen Reactions    Amoxicillin     Augmentin [Amoxicillin-Pot Clavulanate] Nausea/ Vomiting    Statins-Hmg-Coa Reductase Inhibitors Myalgia         Allergies, medication list, reviewed.        Physical exam:  Body mass index is 34.95 kg/m.  ED Triage Vitals [09/08/22 1754]   BP (Non-Invasive) (!) 161/92   Heart Rate 77   Respiratory Rate (!) 24   Temperature 36.6 C (97.8 F)   SpO2 98 %   Weight 95.3 kg (210 lb)   Height 1.651 m (5\' 5" )     Constitutional: Well-developed, well-nourished, and in no distress.   Head: Normocephalic and atraumatic.   Eyes: No scleral icterus   Neck: Normal range of motion.  Cardiovascular: Normal rate, regular rhythm, normal heart sounds..   Pulmonary/Chest: Effort normal and breath sounds normal.   Abdominal: Soft. Non-tender.  No guarding.   Musculoskeletal: Normal range of motion.   Neurological:Patient is alert and oriented .   Skin: Skin is warm  and dry.   Psychiatric: Mood, memory, affect and judgment normal.      The following orders were placed after examining the patient :  Orders Placed This Encounter    XR CHEST AP    BASIC METABOLIC PANEL    CBC/DIFF    TROPONIN-I    B-TYPE NATRIURETIC PEPTIDE    CBC WITH DIFF    ECG 12-LEAD    CANCELED: ECG 12-LEAD      XR CHEST AP    (Results Pending)      Results for orders placed or performed during the hospital encounter of 09/08/22 (from the past 12 hour(s))   BASIC METABOLIC PANEL   Result Value Ref Range     SODIUM 138 136 - 145 mmol/L    POTASSIUM 4.4 3.5 - 5.1 mmol/L    CHLORIDE 107 96 - 111 mmol/L    CO2 TOTAL 23 23 - 31 mmol/L    ANION GAP 8 4 - 13 mmol/L    CALCIUM 8.9 8.6 - 10.3 mg/dL    GLUCOSE 141 (H) 65 - 125 mg/dL    BUN 25 8 - 25 mg/dL    CREATININE 1.39 (H) 0.60 - 1.05 mg/dL    BUN/CREA RATIO 18 6 - 22    ESTIMATED GFR 39 (L) >=60 mL/min/BSA   TROPONIN-I   Result Value Ref Range    TROPONIN-I HS <2.7 <=14.0 ng/L ng/L   CBC WITH DIFF   Result Value Ref Range    WBC 10.3 4.8 - 10.8 x10^3/uL    RBC 3.86 (L) 4.20 - 5.40 x10^6/uL    HGB 11.8 (L) 12.0 - 15.0 g/dL    HCT 36.1 36.0 - 47.0 %    MCV 93.6 80.0 - 100.0 fL    MCH 30.6 27.0 - 32.0 pg    MCHC 32.7 32.0 - 36.0 g/dL    RDW 14.2 12.0 - 15.0 %    PLATELETS 230 140 - 450 x10^3/uL    MPV 9.6 (H) 6.6 - 9.3 fL    NEUTROPHIL % 66 50 - 70 %    LYMPHOCYTE % 25 20 - 35 %    MONOCYTE % 7 3 - 13 %    EOSINOPHIL % 2 0 - 4 %    BASOPHIL % 0 0 - 2 %    NEUTROPHIL # 6.70 2.40 - 7.60 x10^3/uL    LYMPHOCYTE # 2.50 1.00 - 3.80 x10^3/uL    MONOCYTE # 0.80 0.00 - 1.40 x10^3/uL    EOSINOPHIL # 0.20 0.00 - 4.00 x10^3/uL    BASOPHIL # 0.00 0.00 - 0.20 x10^3/uL         ED Course:   ED Course as of 09/08/22 1840   Sun Sep 08, 2022   1834 Chest x-ray per my interpretation shows no acute disease radiology report currently pending            Emergency Department Procedure:    EKG Interpertation:  Sinus rhythm rate of 72.  No ischemic ST segment.  Has left axis deviation and LVH.  Procedures    Medical Decision Making  Patient resting comfortably has unremarkable laboratories negative troponin EKG unrevealing she is had persistent symptoms for more than 5 days.  Negative troponin.    I do not feel she required further diagnostic studies and that she likely has pulmonary source of her pain.  She can follow with her primary care physician this week    Amount and/or Complexity of Data Reviewed  Labs: ordered.  Radiology: ordered.  ECG/medicine tests: ordered.         Findings and  diagnosis discussed with patient.  Clinical Impression:   Clinical Impression   Chest pain, unspecified type (Primary)         Disposition: Discharged          No follow-ups on file.   New Prescriptions    No medications on file      No follow-up provider specified.    Future Appointments  Future Appointments   Date Time Provider Department Center   11/01/2022  1:00 PM Joslyn Devon, MD [facility]   11/26/2022  2:20 PM Quentin Mulling, APRN,NP-C [facility]   12/12/2022 11:20 AM Kaleen Mask, MD [facility]   02/25/2023 10:00 AM Key, Tora Duck, PA-C [facility] [place]        BP (!) 145/60   Pulse 73   Temp 36.6 C (97.8 F)   Resp 18   Ht 1.651 m (5\' 5" )   Wt 95.3 kg (210 lb)   SpO2 98%   BMI 34.95 kg/m        , MD9/17/2023              This note was partially created using voice recognition software and is inherently subject to errors including those of syntax and "sound alike " substitutions which may escape proof reading.  In such instances, original meaning may be extrapolated by contextual derivation.

## 2022-09-09 ENCOUNTER — Other Ambulatory Visit (INDEPENDENT_AMBULATORY_CARE_PROVIDER_SITE_OTHER): Payer: Self-pay

## 2022-09-09 DIAGNOSIS — Z7189 Other specified counseling: Secondary | ICD-10-CM

## 2022-09-09 DIAGNOSIS — R0602 Shortness of breath: Secondary | ICD-10-CM | POA: Insufficient documentation

## 2022-09-09 LAB — ECG 12-LEAD
Atrial Rate: 72 {beats}/min
Calculated P Axis: 43 degrees
Calculated R Axis: -38 degrees
Calculated T Axis: 4 degrees
PR Interval: 186 ms
QRS Duration: 106 ms
QT Interval: 418 ms
QTC Calculation: 457 ms
Ventricular rate: 72 {beats}/min

## 2022-09-09 NOTE — Nursing Note (Signed)
Population Health  Emergency Department Follow-Up   DATE: 09/09/2022, 09:18  PATIENT NAME: Joanna Black  MRN: [id number]  DOB: 1946-05-23  PCP: Genene Churn, DO  INSURANCE: Payor: [place] MEDICARE / Plan: [place] MEDICARE ADVANTAGE PPO / Product Type: PPO /     REASON FOR ENCOUNTER:   Emergency Department follow-up and Social Determinant of Health Assessment.     Post Ed Follow-Up    Post ED Follow-Up:   Document completed and/or attempted interactive contact(s) after transition to home after emergency department stay.:   Transition Facility and relevant Date:   Discharge Date: 09/08/22  Discharge from [facility] Emergency Department?: Yes  Discharge Facility: [facility]  Contacted by: Bing Matter  Contact method: Patient/Caregiver Telephone  Contact completed: 09/09/2022  9:16 AM  Contact first attempt: 09/09/2022  9:15 AM  MyChart message sent?: No  Was the AVS reviewed with patient?: Yes  Interventions: No needs identified  Contacted patient to schedule ED follow up appointment with PCP. Spoke with patient who stated she will call PCP office and schedule appointment soon. No other needs at this time.          Social Determinants of Health     Financial Resource Strain: Not on file   Transportation Needs: Not on file   Social Connections: Not on file   Intimate Partner Violence: Not on file   Housing Stability: Not on file   Health Literacy: Not on file   Employment Status: Not on file        PLAN:   Please see the above for more details. PCP has been notified of this contact.     Bing Matter, PATIENT NAVIGATOR  09/09/2022, 09:18  Patient Navigator, Population Health

## 2022-09-12 LAB — URINE CULTURE,ROUTINE: URINE CULTURE: >100000 — AB

## 2022-09-18 ENCOUNTER — Other Ambulatory Visit (HOSPITAL_COMMUNITY): Payer: Self-pay | Admitting: NURSE PRACTITIONER

## 2022-09-18 ENCOUNTER — Other Ambulatory Visit
Admission: RE | Admit: 2022-09-18 | Discharge: 2022-09-18 | Disposition: A | Discharge status: Home / Self Care | Payer: Medicare PPO | Source: Ambulatory Visit | Attending: NURSE PRACTITIONER | Admitting: NURSE PRACTITIONER

## 2022-09-18 DIAGNOSIS — R309 Painful micturition, unspecified: Secondary | ICD-10-CM | POA: Insufficient documentation

## 2022-09-20 ENCOUNTER — Other Ambulatory Visit (HOSPITAL_BASED_OUTPATIENT_CLINIC_OR_DEPARTMENT_OTHER): Payer: Self-pay | Admitting: Student in an Organized Health Care Education/Training Program

## 2022-09-20 ENCOUNTER — Telehealth (HOSPITAL_BASED_OUTPATIENT_CLINIC_OR_DEPARTMENT_OTHER): Payer: Self-pay

## 2022-09-20 DIAGNOSIS — R1310 Dysphagia, unspecified: Secondary | ICD-10-CM

## 2022-09-20 NOTE — Telephone Encounter (Signed)
Patient called in stating she is in a lot of pain and needs her esophagus stretched again.  I advised I would send a message to Dr Jessy Oto and she voiced understanding      Jacobo Forest, LPN

## 2022-09-22 LAB — URINE CULTURE,ROUTINE: URINE CULTURE: NO GROWTH

## 2022-09-23 ENCOUNTER — Encounter (HOSPITAL_BASED_OUTPATIENT_CLINIC_OR_DEPARTMENT_OTHER): Payer: Self-pay | Admitting: Student in an Organized Health Care Education/Training Program

## 2022-09-23 ENCOUNTER — Telehealth (HOSPITAL_BASED_OUTPATIENT_CLINIC_OR_DEPARTMENT_OTHER): Payer: Self-pay | Admitting: Student in an Organized Health Care Education/Training Program

## 2022-09-23 ENCOUNTER — Encounter (HOSPITAL_COMMUNITY): Payer: Self-pay | Admitting: Student in an Organized Health Care Education/Training Program

## 2022-09-23 NOTE — Telephone Encounter (Signed)
Dr. Minerva Ends ordered an EGD for pt.    Pt is sched on 09/24/2022.  Pt must be NPO after midnight.     -If they have any questions, they may feel free to contact us to discuss any questions they may have.    -The day before procedure, PAT will call to confirm the exact time.  Times may change pending on the sx sched that day.    -Day of procedure pt aware they need to arrive  1.25 hours early on 2nd floor OPS with a driver.    -Pt will not be able to drive for 24 hrs.    -Doc will speak to the pt before and after procedure.    -Clearance needed:   No    Instructions/education mailed: no - gave to patient over the phone  Address confirmed in chart:  No    -Additional comments:

## 2022-09-23 NOTE — Nursing Note (Signed)
COVID-19 Admission Screen    Low Risk:   Able to Provide asymptomatic History  No recent Travel/ Following Stay at Home  No Sick Contacts  No New Household Conatcts    Moderate/High Risk: {None    Test Result:Not Indicated

## 2022-09-24 ENCOUNTER — Inpatient Hospital Stay
Admission: RE | Admit: 2022-09-24 | Discharge: 2022-09-24 | Disposition: A | Discharge status: Home / Self Care | Payer: Medicare PPO | Source: Ambulatory Visit | Attending: Student in an Organized Health Care Education/Training Program | Admitting: Student in an Organized Health Care Education/Training Program

## 2022-09-24 ENCOUNTER — Encounter (HOSPITAL_COMMUNITY): Payer: Self-pay | Admitting: Student in an Organized Health Care Education/Training Program

## 2022-09-24 ENCOUNTER — Encounter (HOSPITAL_COMMUNITY)
Admission: RE | Disposition: A | Discharge status: Home / Self Care | Payer: Self-pay | Source: Ambulatory Visit | Attending: Student in an Organized Health Care Education/Training Program

## 2022-09-24 ENCOUNTER — Ambulatory Visit (HOSPITAL_BASED_OUTPATIENT_CLINIC_OR_DEPARTMENT_OTHER): Payer: Medicare PPO | Admitting: Certified Registered"

## 2022-09-24 ENCOUNTER — Other Ambulatory Visit: Payer: Self-pay

## 2022-09-24 ENCOUNTER — Ambulatory Visit (HOSPITAL_COMMUNITY): Payer: Medicare PPO | Admitting: Certified Registered"

## 2022-09-24 DIAGNOSIS — R1310 Dysphagia, unspecified: Secondary | ICD-10-CM

## 2022-09-24 DIAGNOSIS — K449 Diaphragmatic hernia without obstruction or gangrene: Secondary | ICD-10-CM

## 2022-09-24 DIAGNOSIS — K3189 Other diseases of stomach and duodenum: Secondary | ICD-10-CM

## 2022-09-24 DIAGNOSIS — K222 Esophageal obstruction: Secondary | ICD-10-CM

## 2022-09-24 HISTORY — DX: Diaphragmatic hernia without obstruction or gangrene: K44.9

## 2022-09-24 HISTORY — DX: Personal history of urinary (tract) infections: Z87.440

## 2022-09-24 HISTORY — DX: Unspecified cataract: H26.9

## 2022-09-24 HISTORY — DX: Unspecified asthma, uncomplicated: J45.909

## 2022-09-24 HISTORY — DX: Personal history of other diseases of urinary system: Z87.448

## 2022-09-24 LAB — POC BLOOD GLUCOSE (RESULTS): GLUCOSE, POC: 109 mg/dl (ref 70–110)

## 2022-09-24 SURGERY — GASTROSCOPY WITH DILATION
Anesthesia: General | Site: Mouth | Laterality: Left | Wound class: Clean Contaminated Wounds-The respiratory, GI, Genital, or urinary

## 2022-09-24 MED ORDER — LIDOCAINE (PF) 100 MG/5 ML (2 %) INTRAVENOUS SYRINGE
INJECTION | Freq: Once | INTRAVENOUS | Status: DC | PRN
Start: 2022-09-24 — End: 2022-09-24
  Administered 2022-09-24: 60 mg via INTRAVENOUS

## 2022-09-24 MED ORDER — SIMETHICONE 40 MG/0.6 ML ORAL DROPS,SUSPENSION
Freq: Once | ORAL | Status: DC | PRN
Start: 2022-09-24 — End: 2022-09-24
  Administered 2022-09-24: 40 mg via ORAL

## 2022-09-24 MED ORDER — SODIUM CHLORIDE 0.9 % (FLUSH) INJECTION SYRINGE
3.0000 mL | INJECTION | INTRAMUSCULAR | Status: DC | PRN
Start: 2022-09-24 — End: 2022-09-24

## 2022-09-24 MED ORDER — LACTATED RINGERS INTRAVENOUS SOLUTION
INTRAVENOUS | Status: DC
Start: 2022-09-24 — End: 2022-09-24

## 2022-09-24 MED ORDER — PROPOFOL 10 MG/ML IV BOLUS
INJECTION | Freq: Once | INTRAVENOUS | Status: DC | PRN
Start: 2022-09-24 — End: 2022-09-24
  Administered 2022-09-24: 25 mg via INTRAVENOUS
  Administered 2022-09-24 (×4): 50 mg via INTRAVENOUS

## 2022-09-24 MED ORDER — SODIUM CHLORIDE 0.9 % (FLUSH) INJECTION SYRINGE
3.0000 mL | INJECTION | Freq: Three times a day (TID) | INTRAMUSCULAR | Status: DC
Start: 2022-09-24 — End: 2022-09-24

## 2022-09-24 SURGICAL SUPPLY — 33 items
BITE BLOCK 60FR MAJ1632_50EA/BX (INSTRUMENTS ENDOMECHANICAL) ×1
BLOCK BITE 60FR ADULT STRAP FLXB SH LF  DISP (ENDOSCOPIC SUPPLIES) ×1 IMPLANT
CATH ELHMST INJ GLD PRB 7FR 25_GA .24MM 210CM BIPO RND DIST (INSTRUMENTS ENDOMECHANICAL)
CATH ELHMST INJ GLD PROBE 7FR 25GA .24MM 210CM BIPOLAR RND DIST TIP STD CONN INTGR DISP 2.8MM MN WRK (ENDOSCOPIC SUPPLIES) IMPLANT
CLIP HMST MR CONDITIONAL BRD CATH ROT CONTROL KNOB NO SHEATH RSL 360 235CM 2.8MM 11MM OPN (ENDOSCOPIC SUPPLIES) IMPLANT
DEVICE HEMOSTASIS CLIP 360_235CM RESOLUTION (INSTRUMENTS ENDOMECHANICAL)
DILATOR ENDOS CRE 180CM 5.5CM 18-19-20MM 7.5FR ESOPH PYL COLON BAL LOW PROF GW PEBAX STRL LF  DISP (ENDOSCOPIC SUPPLIES) ×1 IMPLANT
DILATOR ENDOS CRE 180CM 5.5CM_18-19-20MM 7.5FR ESOPH PYL (INSTRUMENTS ENDOMECHANICAL) ×1
FORCEPS BIOPSY HOT 240CM 2.2MM RJ 4 +2.8MM DISP (ENDOSCOPIC SUPPLIES)
FORCEPS BIOPSY HOT 240CM 2.2MM RJ 4 +2.8MM DISPO (ENDOSCOPIC SUPPLIES) IMPLANT
FORCEPS BIOPSY HOT 240CM 2.2MM_RJ 4 +2.8MM DISP (INSTRUMENTS ENDOMECHANICAL)
FORCEPS BIOPSY LRG CPC NEEDLE 240CM 2.2MM RJ 3 DISP ORNG (ENDOSCOPIC SUPPLIES) ×2 IMPLANT
FORCEPS BIOPSY NEEDLE 160CM 1.8MM RJ 4 DISP YW 2MM WRK CHNL GSPED (MED SURG SUPPLIES) IMPLANT
FORCEPS BIOPSY NEEDLE 160CM 1._8MM RJ 4 PED 2+ MM DISP (MED SURG SUPPLIES)
FORCEPS BIOPSY NEEDLE 240CM RJ 4 JMB (ENDOSCOPIC SUPPLIES) IMPLANT
FORCEPS BIOPSY NEEDLE 240CM RJ_4 JMB DISP (INSTRUMENTS ENDOMECHANICAL)
FORCEPS BIOPSY NEEDLE 240CM RJ_4 LRG CPC DISP (INSTRUMENTS ENDOMECHANICAL) ×2
GW ENDOSCOPIC .035IN 260CM DREAMWIRE ANG RX EGLD NITINOL BIL STRL DISP (ENDOSCOPIC SUPPLIES) IMPLANT
LIGATOR 2.8MM 8.6-11.5MM SSS7 ESOPH 1 STNG MLT BAND HNDL STRL DISP ENDOS HMSTS LF (ENDOSCOPIC SUPPLIES) IMPLANT
LIGATOR 2.8MM 8.6-11.5MM SSS7_ESOPH 1 STNG MLT BAND ERG HNDL (INSTRUMENTS ENDOMECHANICAL)
NEEDLE SCLRTX 25GA 2.3MM BVL STRL DISP STAR CATH INTJCT 4MM 240CM (ENDOSCOPIC SUPPLIES) IMPLANT
NEEDLE SCLRTX 25GA 2.3MM BVL S_TRL DISP STAR CATH INTJCT 4MM (INSTRUMENTS ENDOMECHANICAL)
NET SPEC RETR 230CM 2.5MM RTHNT STD SHEATH 6X3CM NONST LF  DISP (ENDOSCOPIC SUPPLIES) IMPLANT
NET SPEC RETR 230CM 2.5MM RTHN_T STD SHTH 6X3CM NONST LF (INSTRUMENTS ENDOMECHANICAL)
PROBE ESURG 220CM 2.3MM FIAPC FLXB STR FIRE STRL DISP (SURGICAL CUTTING SUPPLIES) IMPLANT
PROBE ESURG 220CM 2.3MM FIAPC_FLXB STR FIRE ARGON PLAS COAG (CUTTING ELEMENTS)
SNARE 230CM 2.5MM 3CM PLPK PLP_CTM OVL ENDOS 6CM STRL DISP (INSTRUMENTS ENDOMECHANICAL)
SNARE 230CM 2.5MM 3CM PLPK ROTR RTHNT ENDOS NONST LF  DISP (ENDOSCOPIC SUPPLIES) IMPLANT
SNARE LRG OVL MED STF ENDOS OL_MPS DISP (INSTRUMENTS ENDOMECHANICAL)
SNARE MED OVAL 240CM 2.4MM CAPTIVATR STF ENDOS PLYP 27MM DISP (ENDOSCOPIC SUPPLIES) IMPLANT
SNARE MED OVL 240CM 2.4MM CAPT_IVATR STF ENDOS PLYP 27MM DISP (INSTRUMENTS ENDOMECHANICAL)
SYRINGE INFLAT ALN II GA STRL DISP 60ML (ENDOSCOPIC SUPPLIES) ×1 IMPLANT
SYRINGE INFLAT ALN II GA STRL_DISP 60ML (INSTRUMENTS ENDOMECHANICAL) ×1

## 2022-09-24 NOTE — Anesthesia Preprocedure Evaluation (Signed)
ANESTHESIA PRE-OP EVALUATION  Planned Procedure: GASTROSCOPY WITH DILATION (Left: Mouth)  Review of Systems     anesthesia history negative     patient summary reviewed          Pulmonary   COPD, mild, asthma and rescue inhaler,   Cardiovascular    Hypertension, CAD, CHF, ECG reviewed,     Nonischemic cardiomyopathy    EKG  Normal sinus rhythm  Anteroseptal infarct , age undetermined  Abnormal ECG  No previous ECGs available  Confirmed by Stark Jock ([id number]) on 12/01/2021 12:06:35 AM    ECHO  The left ventricle is small. Concentric remodeling. The left ventricular ejection fraction by visual assessment is estimated to be 55-60%.  Left ventricular systolic function is normal. No segmental/regional wall motion abnormalities identified. Left ventricular diastolic  parameters are normal.  Normal right ventricular size. Normal right ventricular systolic function. Right ventricular systolic pressure is normal.  No significant valvular heart disease.    , ACE / ARB inhibitor use, ACE inhibitor taken in the last 24 hours and hyperlipidemia ,No peripheral edema,   ,beta blocker therapy  ,not taken in last 24 hours     GI/Hepatic/Renal    GERD and renal insufficiency        Endo/Other    osteoarthritis and obesity,   type 2 diabetes    Neuro/Psych/MS    back abnormality, depression     Cancer    negative hematology/oncology ROS,                     Physical Assessment      Airway       Mallampati: II    TM distance: <3 FB    Neck ROM: full  Mouth Opening: good.  No Facial hair  No Beard        Dental           (+) partials, missing             Pulmonary      (+) decreased breath sounds present        Cardiovascular    Rhythm: regular  Rate: Normal  (-) no friction rub, carotid bruit is not present, no peripheral edema and no murmur     Other findings              Plan  ASA 3     Planned anesthesia type: general     total intravenous anesthesia                  Intravenous induction     Anesthesia issues/risks discussed are:  PONV, Dental Injuries, Intraoperative Awareness/ Recall, Aspiration, Sore Throat, Cardiac Events/MI, Difficult Airway, Stroke, Nerve Injuries, Post-op Intubation/Ventilation, Eye /Visual Loss, Post-op Agitation/Tantrum, Post-op Cognitive Dysfunction and Blood Loss.  Anesthetic plan and risks discussed with patient  signed consent obtained            Patient's NPO status is appropriate for Anesthesia.           Plan discussed with CRNA.

## 2022-09-24 NOTE — Discharge Instructions (Signed)
[facility] Discharge Instructions after an EGD with Biopsy given to patient and discussed

## 2022-09-24 NOTE — H&P (Signed)
Joanna Black  Operating Room H&P/Update    Name: Joanna Black  Age: 76 y.o.    Primary Care Provider:  Samella Parr, DO    HPI:    Joanna Black is a pleasent 76 y.o. female with past medical history as mentioned below is in the endoscopic unit for further evaluation of  dysphagia, esophageal stenosis for dilatation     Patient is planned for EGD today as previously recommended. There is no change in interval history from prior visit.       Review of Systems:  Constitutional: Denies fevers, chills  Cardiovascular: Denies chest pain  Neurological: Denies seizures    Past Medical History  Past Medical History:   Diagnosis Date    Arthritis     Asthma     Back problem     BMI 35.0-35.9,adult 02/16/2020    CAD (coronary artery disease)     mild    Cataract     Cataracts, bilateral     CHF (congestive heart failure) (CMS HCC)     Chronic bronchitis with emphysema     Chronic low back pain     Claustrophobia     Depression     Diabetes (CMS HCC)     Diabetes mellitus, type 2 (CMS HCC)     Esophageal reflux     Essential hypertension 01/28/2019    H/O urinary tract infection     HH (hiatus hernia)     History of kidney disease     states, "low kidney functions."    Hypercholesterolemia     Hypertension     Hypertriglyceridemia     Type 2 diabetes mellitus (CMS HCC)          Past Surgical History:   Procedure Laterality Date    CATARACT EXTRACTION, BILATERAL Bilateral     bilateral lens implant    COLONOSCOPY      ESOPHAGOGASTRODUODENOSCOPY      HX CARPAL TUNNEL RELEASE      HX CATARACT REMOVAL Right 12/30/2019    HX CATARACT REMOVAL Left 01/06/2020    HX CHOLECYSTECTOMY      HX EXPOSURE TO METAL SHAVINGS      HX HEART CATHETERIZATION      HX HYSTERECTOMY      HX OOPHORECTOMY      HX TAH AND BSO      HX TUBAL LIGATION      HX VEIN STRIPPING      Left leg         Family Medical History:       Problem Relation (Age of Onset)    Bone cancer Father    Diabetes Multiple family members    Hypertension (High Blood  Pressure) Multiple family members    No Known Problems Mother    Stomach Cancer Paternal Grandmother            Social History     Socioeconomic History    Marital status: Married     Spouse name: Joanna Black    Number of children: 3    Years of education: 73    Highest education level: High school graduate   Occupational History    Occupation: Retired   Tobacco Use    Smoking status: Never     Passive exposure: Never    Smokeless tobacco: Never   Vaping Use    Vaping Use: Never used   Substance and Sexual Activity    Alcohol use: No  Alcohol/week: 0.0 standard drinks of alcohol    Drug use: No    Sexual activity: Not Currently     Partners: Male   Other Topics Concern    Right hand dominant Yes    Ability to Walk 1 Flight of Steps without SOB/CP No    Ability To Do Own ADL's Yes   Social History Narrative    MARRIED.  Lives in single story home.  Heats home with electric and has well water.        Joanna Haney, LPN  [phone], [phone]     @MEDSTAKING @  Allergies   Allergen Reactions    Amoxicillin     Augmentin [Amoxicillin-Pot Clavulanate] Nausea/ Vomiting    Statins-Hmg-Coa Reductase Inhibitors Myalgia       Examination:  BP (!) 143/56   Pulse 69   Temp 36.3 C (97.3 F)   Resp 18   Ht 1.651 m (5\' 5" )   Wt 95.3 kg (210 lb)   SpO2 98%   BMI 34.95 kg/m        Wt Readings from Last 2 Encounters:   09/24/22 95.3 kg (210 lb)   09/08/22 95.3 kg (210 lb)     Head: Atraumatic and normocephalic  Eyes: Conjunctiva clear, sclera non-icteric  Neck:  Supple  Lungs: Normal reparatory effort bilaterally  Cardiovascular: Regular rate  Neurologic: Alert,  Grossly normal  Psychiatric: Appears normal    CBC Results Coags Results   No results for input(s): "WBC", "HGB", "HCT", "PLTCNT", "BANDS" in the last 72 hours.    Invalid input(s): "PLATELETCOUNT" No results for input(s): "INR", "PROTHROMTME", "APTT" in the last 72 hours.    Invalid input(s): "PTT", "PT", "CREACTPROTIE"   BMP Results Other Chemistries Results   No results  for input(s): "SODIUM", "POTASSIUM", "CHLORIDE", "CO2", "BUN", "CREATININE", "GFR", "ANIONGAP" in the last 72 hours. No results for input(s): "CALCIUM", "ALBUMIN", "MAGNESIUM", "PHOSPHORUS" in the last 72 hours.   Liver/Pancreas Enzyme Results Blood Gas     No results for input(s): "TOTALPROTEIN", "ALBUMIN", "PREALBUMIN", "AST", "ALT", "ALKPHOS", "LDH", "AMYLASE", "LIPASE" in the last 72 hours.    Invalid input(s): "GGT" No results found for this encounter     Assessment  Joanna Black is a 76 y.o. female is seen in the endoscopy unit for endoscopic procedures.    Plan  Informed consent was obtained.      Discussed with patient the consent for EGD. Alternative to endoscopy were also discussed. The risks/benefits and complications including but not limited to anesthesia, bleeding, dental injury, aspiration, CV/pulmonary risk, perforation, infection, missed lesions, need for surgery & remote possibility of death were discussed with patient who understand and agreed to proceed.   Proceed to endoscopy procedure as previously planned.     Clenton Pare, [facility] - G[facility]enter[facility]  [place], [place]  [id number]

## 2022-09-24 NOTE — OR PreOp (Signed)
COVID-19 Admission Screen    Low Risk:  None    Moderate/High Risk: {None    Test Result:Not Indicated

## 2022-09-24 NOTE — Anesthesia Postprocedure Evaluation (Signed)
Anesthesia Post Op Evaluation    Patient: Joanna Black  Procedure(s):  GASTROSCOPY WITH DILATION    Last Vitals:Temperature: 36.3 C (97.3 F) (09/24/22 1328)  Heart Rate: 71 (09/24/22 1435)  BP (Non-Invasive): (!) 141/56 (09/24/22 1435)  Respiratory Rate: 16 (09/24/22 1435)  SpO2: 96 % (09/24/22 1435)    No notable events documented.    Patient is sufficiently recovered from the effects of anesthesia to participate in the evaluation and has returned to their pre-procedure level.  Patient location during evaluation: bedside       Patient participation: complete - patient participated  Level of consciousness: awake and alert and responsive to verbal stimuli    Pain score: 0  Pain management: adequate  Airway patency: patent    Anesthetic complications: no  Cardiovascular status: acceptable  Respiratory status: acceptable  Hydration status: acceptable  Patient post-procedure temperature: Pt Normothermic   PONV Status: Absent

## 2022-09-25 ENCOUNTER — Other Ambulatory Visit (HOSPITAL_COMMUNITY): Payer: Self-pay | Admitting: Student in an Organized Health Care Education/Training Program

## 2022-09-25 DIAGNOSIS — K3189 Other diseases of stomach and duodenum: Secondary | ICD-10-CM

## 2022-09-26 ENCOUNTER — Other Ambulatory Visit (INDEPENDENT_AMBULATORY_CARE_PROVIDER_SITE_OTHER): Payer: Self-pay | Admitting: Family Medicine

## 2022-09-26 MED ORDER — OMEPRAZOLE 20 MG CAPSULE,DELAYED RELEASE
20.0000 mg | DELAYED_RELEASE_CAPSULE | Freq: Every day | ORAL | 1 refills | Status: DC
Start: 2022-09-26 — End: 2023-01-20

## 2022-09-27 ENCOUNTER — Telehealth (HOSPITAL_BASED_OUTPATIENT_CLINIC_OR_DEPARTMENT_OTHER): Payer: Self-pay | Admitting: Student in an Organized Health Care Education/Training Program

## 2022-09-27 NOTE — Telephone Encounter (Signed)
Dr. Minerva Ends ordered an EUS for pt.    Pt is sched on 03/27/2023.  Pt must be NPO after midnight.     --If they have any questions they may feel free to contact us to discuss any questions they may have.    The day before procedure, PAT will call to confirm the exact time.  Times may change pending on the sx sched that day.    -Day of procedure pt aware they need to arrive  1.25 hours early on 2nd floor OPS with a driver.    -Pt will not be able to drive for 24 hrs.    -Doc will speak to the pt before and after procedure.    -Clearance needed?:   No    Instructions/education mailed: yes  Address confirmed in chart:  No      Other sched info:  Scope needed: Radial  FNA:  No  Cytololgy:  No      Additional comments:

## 2022-10-03 ENCOUNTER — Ambulatory Visit: Payer: Medicare PPO | Attending: Family Medicine | Admitting: Family Medicine

## 2022-10-03 ENCOUNTER — Encounter (INDEPENDENT_AMBULATORY_CARE_PROVIDER_SITE_OTHER): Payer: Self-pay | Admitting: Family Medicine

## 2022-10-03 ENCOUNTER — Other Ambulatory Visit: Payer: Self-pay

## 2022-10-03 VITALS — BP 110/70 | HR 66 | Temp 97.4°F | Resp 18 | Ht 64.0 in | Wt 206.2 lb

## 2022-10-03 DIAGNOSIS — M542 Cervicalgia: Secondary | ICD-10-CM | POA: Insufficient documentation

## 2022-10-03 DIAGNOSIS — E1122 Type 2 diabetes mellitus with diabetic chronic kidney disease: Secondary | ICD-10-CM | POA: Insufficient documentation

## 2022-10-03 DIAGNOSIS — Z7982 Long term (current) use of aspirin: Secondary | ICD-10-CM | POA: Insufficient documentation

## 2022-10-03 DIAGNOSIS — M549 Dorsalgia, unspecified: Secondary | ICD-10-CM | POA: Insufficient documentation

## 2022-10-03 DIAGNOSIS — E785 Hyperlipidemia, unspecified: Secondary | ICD-10-CM | POA: Insufficient documentation

## 2022-10-03 DIAGNOSIS — Z7985 Long-term (current) use of injectable non-insulin antidiabetic drugs: Secondary | ICD-10-CM | POA: Insufficient documentation

## 2022-10-03 DIAGNOSIS — N183 Chronic kidney disease, stage 3 unspecified: Secondary | ICD-10-CM | POA: Insufficient documentation

## 2022-10-03 DIAGNOSIS — G8929 Other chronic pain: Secondary | ICD-10-CM | POA: Insufficient documentation

## 2022-10-03 DIAGNOSIS — Z79899 Other long term (current) drug therapy: Secondary | ICD-10-CM | POA: Insufficient documentation

## 2022-10-03 DIAGNOSIS — E119 Type 2 diabetes mellitus without complications: Secondary | ICD-10-CM | POA: Insufficient documentation

## 2022-10-03 DIAGNOSIS — K222 Esophageal obstruction: Secondary | ICD-10-CM | POA: Insufficient documentation

## 2022-10-03 DIAGNOSIS — I152 Hypertension secondary to endocrine disorders: Secondary | ICD-10-CM | POA: Insufficient documentation

## 2022-10-03 DIAGNOSIS — E1159 Type 2 diabetes mellitus with other circulatory complications: Secondary | ICD-10-CM | POA: Insufficient documentation

## 2022-10-03 DIAGNOSIS — K219 Gastro-esophageal reflux disease without esophagitis: Secondary | ICD-10-CM | POA: Insufficient documentation

## 2022-10-03 DIAGNOSIS — K449 Diaphragmatic hernia without obstruction or gangrene: Secondary | ICD-10-CM | POA: Insufficient documentation

## 2022-10-03 DIAGNOSIS — M62838 Other muscle spasm: Secondary | ICD-10-CM | POA: Insufficient documentation

## 2022-10-03 DIAGNOSIS — Z23 Encounter for immunization: Secondary | ICD-10-CM | POA: Insufficient documentation

## 2022-10-03 DIAGNOSIS — Z6835 Body mass index (BMI) 35.0-35.9, adult: Secondary | ICD-10-CM | POA: Insufficient documentation

## 2022-10-03 DIAGNOSIS — E1169 Type 2 diabetes mellitus with other specified complication: Secondary | ICD-10-CM | POA: Insufficient documentation

## 2022-10-03 LAB — POCT HGB A1C: POCT HGB A1C: 6.3 % — AB (ref 4–6)

## 2022-10-03 MED ORDER — TIZANIDINE 4 MG TABLET
ORAL_TABLET | ORAL | 3 refills | Status: DC
Start: 2022-10-03 — End: 2022-12-30

## 2022-10-03 NOTE — Nursing Note (Signed)
10/03/22 1054   Fall Risk Assessment   Do you feel unsteady when standing or walking? No   Do you worry about falling? No   Have you fallen in the past year? No

## 2022-10-03 NOTE — Nursing Note (Signed)
10/03/22 1051   PHQ 9 (follow up)   Little interest or pleasure in doing things. 0   Feeling down, depressed, or hopeless 1   PHQ 2 Total 1   Trouble falling or staying asleep, or sleeping too much. 1   Feeling tired or having little energy 1   Poor appetite or overeating 1   Feeling bad about yourself/ that you are a failure in the past 2 weeks? 0   Trouble concentrating on things in the past 2 weeks? 0   Moving/Speaking slowly or being fidgety or restless  in the past 2 weeks? 0   Thoughts that you would be better off DEAD, or of hurting yourself in some way. 0   If you checked off any problems, how difficult have these problems made it for you to do your work, take care of things at home, or get along with other people? Not difficult at all   PHQ 9 Total 4   Interpretation of Total Score No depression

## 2022-10-03 NOTE — Nursing Note (Signed)
Influenza Vaccine, 65+    Linked Order: [id number]   Current Status    Updated on: 10/03/2022  4:19 PM    Name Date Status Dose VIS Date Route Site Manufacturer Lot# Given By Verified By   Influenza Vaccine, 65+ 10/03/2022 Given 0.5 mL 07/28/2020 IM [employer] [id number] [place], Ronin Rehfeldt, LPN --   Exp. Date [facility] # Product Time Location External Inventory Class Comment   05/14/2023 [phone] Daine Gravel 8144-81(85U up)(PF) -- -- -- General --   Question Answer Comment   Is patient 76 years of age ? Yes    Is Medication Patient Supplied? No    Date VIS/EUA Fact Sheet given ? 10/03/2022    Updated by: Georgiana Spinner, LPN               Previous Statuses    Updated on: 10/03/2022  4:16 PM    Name Date Status Dose VIS Date Route Site Manufacturer Lot# Given By Verified By   Influenza Vaccine, 65+ 10/03/2022 Incomplete 0.5 mL 07/28/2020 IM -- -- -- -- --   Exp. Date [facility] # Product Time Location External Inventory Class Comment   -- -- -- -- -- -- -- --   Updated by: Georgiana Spinner, LPN

## 2022-10-03 NOTE — Nursing Note (Signed)
10/03/22 1600   Required: Location Test Performed At:   [place], [place] [id number]   A1C   A1C 6.3   References Ranges 4 - 6%   A1C Lot # [id number]   Expiration Date 06/02/24   Internal Control Valid yes   Initials adh, lpn   Lancet used on  Finger

## 2022-10-03 NOTE — Progress Notes (Signed)
[facility]  [address]  [place],  [address]  P: [phone]  F: [phone]       NAME:  Joanna Black  DOB:  [id number]  AGE:  76 y.o.  MRN:  [id number]  APPT:  10/03/2022     Chief Complaint   Patient presents with    Follow Up 6 Months    Follow-up After Testing    Neck Pain    Hernia     Belching and diarrhea      Subjective:     This is a case of a 76 y.o. year old female who comes in today for follow-up regarding history of type 2 diabetes, hypertension/CKD, hyperlipidemia, GERD/history of esophageal stricture/hiatal hernia, chronic neck pain/chronic upper back pain/history of muscle spasms, and severe obesity.  Patient admits to continued symptoms esophageal reflux and pressure.  Patient also admits to persistent neck and upper back pain.  Patient continues to follow with GI specialist.  She admits to compliance with her medications.  Today, we reviewed said medication list along with most recent lab results. Patient denies recent symptoms of fatigue, fever, chills, night sweats, chest pain, irregular heart beats, abdominal pain, or change in bowel/bladder function.     Past Medical History:   Diagnosis Date    Arthritis     Asthma     Back problem     BMI 35.0-35.9,adult 02/16/2020    CAD (coronary artery disease)     mild    Cataract     Cataracts, bilateral     CHF (congestive heart failure) (CMS HCC)     Chronic bronchitis with emphysema     Chronic low back pain     Claustrophobia     Depression     Diabetes (CMS HCC)     Diabetes mellitus, type 2 (CMS HCC)     Esophageal reflux     Essential hypertension 01/28/2019    H/O urinary tract infection     HH (hiatus hernia)     History of kidney disease     states, "low kidney functions."    Hypercholesterolemia     Hypertension     Hypertriglyceridemia     Type 2 diabetes mellitus (CMS HCC)          Past Surgical History:   Procedure Laterality Date    CATARACT EXTRACTION, BILATERAL Bilateral     bilateral lens implant     COLONOSCOPY      ENDOSCOPIC U/S UPPER with FNA N/A 05/10/2022    Performed by Freddie Apley, DO at [place]  05/10/2022    Performed by Freddie Apley, DO at [place] [place] Left 09/24/2022    Performed by Clenton Pare, MD at [place] Left 06/05/2022    Performed by Clenton Pare, MD at [facility] and bxs Left 05/07/2022    Performed by Clenton Pare, MD at [facility] Right 12/30/2019    HX CATARACT REMOVAL Left 01/06/2020    HX CHOLECYSTECTOMY      HX EXPOSURE TO METAL SHAVINGS      HX HEART CATHETERIZATION      HX HYSTERECTOMY      HX OOPHORECTOMY  HX TAH AND BSO      HX TUBAL LIGATION      HX VEIN STRIPPING      Left leg    PHACO WITH INTRAOCULAR LENS Left 01/06/2020    Performed by Gwendalyn Ege, MD at [place] [facility] Right 12/30/2019    Performed by Gwendalyn Ege, MD at [place] History:       Problem Relation (Age of Onset)    Bone cancer Father    Diabetes Multiple family members    Hypertension (High Blood Pressure) Multiple family members    No Known Problems Mother    Stomach Cancer Paternal Grandmother            Social History     Socioeconomic History    Marital status: Married     Spouse name: [name]    Number of children: 3    Years of education: 74    Highest education level: High school graduate   Occupational History    Occupation: Retired   Tobacco Use    Smoking status: Never     Passive exposure: Never    Smokeless tobacco: Never   Vaping Use    Vaping Use: Never used   Substance and Sexual Activity    Alcohol use: No     Alcohol/week: 0.0 standard drinks of alcohol    Drug use: No    Sexual activity: Not Currently     Partners: Male   Other Topics Concern    Right hand dominant Yes    Ability to Walk 1 Flight of Steps without SOB/CP No     Ability To Do Own ADL's Yes   Social History Narrative    MARRIED.  Lives in single story home.  Heats home with electric and has well water.        April Haney, LPN  [id number], [id number]     Social Determinants of Health     Financial Resource Strain: Low Risk  (10/03/2022)    Financial Resource Strain     SDOH Financial: No   Transportation Needs: Low Risk  (10/03/2022)    Transportation Needs     SDOH Transportation: No   Social Connections: Medium Risk (10/03/2022)    Social Connections     SDOH Social Isolation: 3 to 5 times a week   Intimate Partner Violence: Low Risk  (10/03/2022)    Intimate Partner Violence     SDOH Domestic Violence: No   Housing Stability: Low Risk  (10/03/2022)    Housing Stability     SDOH Housing Situation: I have housing.     SDOH Housing Worry: No     Current Outpatient Medications   Medication Sig    acetaminophen (TYLENOL) 500 mg Oral Tablet Take 1 Tablet (500 mg total) by mouth Every night as needed Takes 2 at night    albuterol sulfate (PROVENTIL OR VENTOLIN OR PROAIR) 90 mcg/actuation Inhalation oral inhaler Take 1-2 Puffs by inhalation Every 6 hours as needed    albuterol sulfate (PROVENTIL) 2.5 mg /3 mL (0.083 %) Inhalation nebulizer solution Take 3 mL (2.5 mg total) by nebulization Every 4 hours as needed for Wheezing    amLODIPine (NORVASC) 2.5 mg Oral Tablet Take 1 Tablet (2.5 mg total) by mouth Once a day    aspirin (ECOTRIN) 81 mg Oral Tablet, Delayed Release (E.C.) Take 1 Tablet (81 mg total) by mouth Once a day  calcium carbonate/vitamin D3 (VITAMIN D-3 ORAL) Take 2,000 Int'l Units/day by mouth Once a day    ezetimibe (ZETIA) 10 mg Oral Tablet Take 1 Tablet (10 mg total) by mouth Every evening    FLUoxetine (PROZAC) 20 mg Oral Capsule TAKE 1 CAPSULE BY MOUTH EVERY DAY    Irbesartan (AVAPRO) 75 mg Oral Tablet TAKE 1 TABLET BY MOUTH EVERY DAY (STOP LOSARTAN/HCTZ)    metoprolol succinate (TOPROL-XL) 25 mg Oral Tablet Sustained Release 24 hr TAKE 1 TABLET BY MOUTH EVERY  DAY AT NIGHT    omeprazole (PRILOSEC) 20 mg Oral Capsule, Delayed Release(E.C.) Take 1 Capsule (20 mg total) by mouth Once a day    tiZANidine (ZANAFLEX) 4 mg Oral Tablet TAKE 1 TABLET THREE TIMES A DAY AS NEEDED FOR MUSCLE CRAMPS Strength: 4 mg    TRULICITY [id number] mL Subcutaneous Pen Injector INJECT 0.5 ML (0.75 MG) UNDER  THE SKIN EVERY 7 DAYS (Patient taking differently: sundays)     Review of Systems   Constitutional:  Negative for activity change, appetite change, chills, fatigue, fever and unexpected weight change.   HENT:  Negative for congestion, postnasal drip, rhinorrhea, sinus pain, sore throat and trouble swallowing.    Eyes:  Negative for visual disturbance.   Respiratory:  Negative for cough, chest tightness, shortness of breath and wheezing.    Cardiovascular:  Negative for chest pain, palpitations and leg swelling.   Gastrointestinal:  Negative for abdominal pain, constipation, diarrhea, nausea and vomiting.   Genitourinary:  Negative for difficulty urinating and dysuria.   Musculoskeletal:  Positive for back pain and neck pain. Negative for arthralgias, gait problem and myalgias.   Skin:  Negative for rash and wound.   Neurological:  Negative for dizziness, weakness, light-headedness and headaches.   Hematological:  Negative for adenopathy.   Psychiatric/Behavioral:  Negative for confusion, decreased concentration and sleep disturbance. The patient is not nervous/anxious.    All other systems reviewed and are negative.    Objective:   BP 110/70 (Site: Left, Patient Position: Sitting, Cuff Size: Adult)   Pulse 66   Temp 36.3 C (97.4 F) (Temporal)   Resp 18   Ht 1.626 m (5\' 4" )   Wt 93.5 kg (206 lb 3.2 oz)   SpO2 97%   BMI 35.39 kg/m       Physical Exam  Vitals reviewed.   Constitutional:       General: She is not in acute distress.     Appearance: Normal appearance. She is obese. She is not ill-appearing, toxic-appearing or diaphoretic.   HENT:      Head: Normocephalic and atraumatic.    Eyes:      General: No scleral icterus.        Right eye: No discharge.         Left eye: No discharge.      Extraocular Movements: Extraocular movements intact.      Conjunctiva/sclera: Conjunctivae normal.   Cardiovascular:      Rate and Rhythm: Normal rate and regular rhythm.      Pulses: Normal pulses.      Heart sounds: Normal heart sounds.   Pulmonary:      Effort: Pulmonary effort is normal. No respiratory distress.      Breath sounds: Normal breath sounds. No wheezing, rhonchi or rales.   Abdominal:      General: Bowel sounds are normal. There is no distension.      Palpations: Abdomen is soft.      Tenderness:  There is no abdominal tenderness. There is no right CVA tenderness, left CVA tenderness or guarding.   Musculoskeletal:         General: No swelling, tenderness or deformity. Normal range of motion.      Cervical back: Normal range of motion and neck supple.      Right lower leg: No edema.      Left lower leg: No edema.   Skin:     General: Skin is warm.      Capillary Refill: Capillary refill takes less than 2 seconds.      Coloration: Skin is not jaundiced or pale.      Findings: No bruising, erythema, lesion or rash.   Neurological:      General: No focal deficit present.      Mental Status: She is alert and oriented to person, place, and time. Mental status is at baseline.      Cranial Nerves: No cranial nerve deficit.      Sensory: No sensory deficit.      Motor: No weakness.      Coordination: Coordination normal.      Gait: Gait normal.      Deep Tendon Reflexes: Reflexes normal.   Psychiatric:         Mood and Affect: Mood normal.         Behavior: Behavior normal.         Thought Content: Thought content normal.         Judgment: Judgment normal.       PHQ Questionnaire  Little interest or pleasure in doing things.: Not at all  Feeling down, depressed, or hopeless: Several Days  PHQ 2 Total: 1  Trouble falling or staying asleep, or sleeping too much.: Several Days  Feeling tired or having  little energy: Several Days  Poor appetite or overeating: Several Days  Feeling bad about yourself/ that you are a failure in the past 2 weeks?: Not at all  Trouble concentrating on things in the past 2 weeks?: Not at all  Moving/Speaking slowly or being fidgety or restless  in the past 2 weeks?: Not at all  Thoughts that you would be better off DEAD, or of hurting yourself in some way.: Not at all  PHQ 9 Total: 4  Interpretation of Total Score: 0-4 No depression    Assessment/Plan     1. Type 2 diabetes mellitus (CMS HCC)  Diabetes Monitors  A1C - Glucose - Lipids Microalbumin   Results in Last 18 Months   Lab Test 11/29/21  0847 03/13/22  0000   HA1C 6.3* 5.9   CHOLESTEROL 221*  --    HDLCHOL 57  --    LDLCHOL 124*  --    TRIG 200*  --     Results in Last 18 Months   Lab Test 04/09/22  1239   MICALBRNUR 1.0   MICALBCRERAT 12.3        Retinal Exam Date: 10/26/2019 DM Retinopathy - Negative Date--10/26/2019  Last Foot Exam: 03/13/2022  POC A1C 6.3 today  Patient to continue Trulicity 2.70 mg under the skin once every 7 days  - POCT Hgb A1c  - CBC/DIFF; Future  - THYROID STIMULATING HORMONE (SENSITIVE TSH); Future  - Comp Metabolic Panel-Non Fasting; Future  - Magnesium; Future  - Hemoglobin A1C; Future  - Urine Microalbumin/Creatinine Ratio, Random; Future    2. Hypertension associated with type 2 diabetes mellitus (CMS HCC)/CKD (chronic kidney disease) stage 3, 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/min (  CMS HCC)  BP Readings from Last 2 Encounters:   10/03/22 110/70   09/24/22 (!) 134/59   Patient presents with normal blood pressure 110/70 with heart rate of 66  Continue Norvasc 2.5 mg p.o. once daily along with aspirin 81 mg p.o. once daily, irbesartan 75 mg p.o. once daily, Toprol-XL 25 mg p.o. nightly  - Comp Metabolic Panel-Non Fasting; Future    3. Hyperlipidemia associated with type 2 diabetes mellitus (CMS HCC)   Lab Results   Component Value Date    CHOLESTEROL 221 (H) 11/29/2021    HDLCHOL 57 11/29/2021    LDLCHOL 124 (H)  11/29/2021    TRIG 200 (H) 11/29/2021   Continue Zetia 10 mg p.o. once every evening  - Lipid Panel; Future    4. GERD (gastroesophageal reflux disease)/Hx. of Esophageal stricture/Hiatal hernia  Stable  Continue Prilosec 20 mg p.o. once daily  Patient to continue following with GI specialist  Will continue to follow    5. Chronic neck pain/Chronic upper back pain/Hx. of Muscle spasm  Stable  Patient to continue supportive care   Order placed for xray of cervical and thoracic spine  Pending x-ray results will consider further evaluation with MRI and/or specialist referral.  - Cervical Spine Series Xray; Future  - XR THORACIC SPINE; Future  - tiZANidine (ZANAFLEX) 4 mg Oral Tablet; TAKE 1 TABLET THREE TIMES A DAY AS NEEDED FOR MUSCLE CRAMPS Strength: 4 mg  Dispense: 90 Tablet; Refill: 3    6. Need for influenza vaccination  Patient received flu vaccine today.     7. Severe obesity (BMI 35.0-35.9 with comorbidity) (CMS HCC)   BMI Plan of Care: Intervention for BMI inappropriate due to age over 58 and comorbidities.    Orders Placed This Encounter    Cervical Spine Series Xray    XR THORACIC SPINE    CBC/DIFF    THYROID STIMULATING HORMONE (SENSITIVE TSH)    Comp Metabolic Panel-Non Fasting    Lipid Panel    Magnesium    Hemoglobin A1C    Urine Microalbumin/Creatinine Ratio, Random    POCT Hgb A1c    tiZANidine (ZANAFLEX) 4 mg Oral Tablet     Health Maintenance Due   Topic Date Due    Hepatitis C screening  Never done    Adult Tdap-Td (1 - Tdap) Never done    Shingles Vaccine (2 of 3) 12/16/2013    Diabetic Retinal Exam  10/25/2021    Influenza Vaccine (1) 08/23/2022    Covid-19 Vaccine (5 - 2023-24 season) 08/23/2022    Diabetic A1C  09/13/2022     Follow up: Return in about 3 months (around 01/03/2023) for Check Up/Labs .    The patient was given ample opportunity to ask questions and those questions were answered to the patient's satisfaction. The patient was encouraged to be involved in their own care, and all  diagnoses, medications, and medication side-effects were discussed. The patient was told to contact me with any additional questions or concerns, or go to the ED in the case of an emergency.       Adeeb Konecny, DO

## 2022-10-03 NOTE — Nursing Note (Signed)
10/03/22 1052   Health Education and Literacy   How often do you have a problem understanding what is told to you about your medical condition?  Never   Domestic Violence   Because we are aware of abuse and domestic violence today, we ask all patients: Are you being hurt, hit, or frightened by anyone at your home or in your life?  N   Basic Needs   Do you have any basic needs within your home that are not being met? (such as Food, Shelter, Games developer, Tranportation, paying for bills and/or medications) N   Advanced Directives   Do you have any advanced directives? No Advance   Would you like an advanced directive packet? Refused Packet

## 2022-10-03 NOTE — Nursing Note (Signed)
I discussed with patient their care gaps for routing health maintenance.  They declined hep c testing    I also discussed they need the following vaccines which we do not provide at our clinic but they can get a the local pharmacy.  tdap, shingles, and covid 19

## 2022-10-03 NOTE — Addendum Note (Signed)
Addended by: Georgiana Spinner D on: 10/03/2022 04:19 PM     Modules accepted: Orders

## 2022-10-08 ENCOUNTER — Inpatient Hospital Stay (HOSPITAL_COMMUNITY)
Admission: RE | Admit: 2022-10-08 | Discharge: 2022-10-08 | Disposition: A | Discharge status: Home / Self Care | Payer: Medicare PPO | Source: Ambulatory Visit | Attending: Family Medicine | Admitting: Family Medicine

## 2022-10-08 ENCOUNTER — Other Ambulatory Visit: Payer: Self-pay

## 2022-10-08 ENCOUNTER — Inpatient Hospital Stay
Admission: RE | Admit: 2022-10-08 | Discharge: 2022-10-08 | Disposition: A | Discharge status: Home / Self Care | Payer: Medicare PPO | Source: Ambulatory Visit | Attending: Family Medicine | Admitting: Family Medicine

## 2022-10-08 DIAGNOSIS — G8929 Other chronic pain: Secondary | ICD-10-CM | POA: Insufficient documentation

## 2022-10-08 DIAGNOSIS — M542 Cervicalgia: Secondary | ICD-10-CM

## 2022-10-29 ENCOUNTER — Inpatient Hospital Stay (HOSPITAL_COMMUNITY): Payer: Medicare PPO | Admitting: Student in an Organized Health Care Education/Training Program

## 2022-10-29 ENCOUNTER — Inpatient Hospital Stay
Admission: EM | Admit: 2022-10-29 | Discharge: 2022-11-01 | DRG: 546 | Disposition: A | Discharge status: Home / Self Care | Payer: Medicare PPO | Attending: Student in an Organized Health Care Education/Training Program | Admitting: Student in an Organized Health Care Education/Training Program

## 2022-10-29 ENCOUNTER — Other Ambulatory Visit: Payer: Self-pay

## 2022-10-29 ENCOUNTER — Emergency Department (HOSPITAL_COMMUNITY): Payer: Medicare PPO

## 2022-10-29 ENCOUNTER — Encounter (HOSPITAL_COMMUNITY): Payer: Self-pay

## 2022-10-29 DIAGNOSIS — R9431 Abnormal electrocardiogram [ECG] [EKG]: Secondary | ICD-10-CM | POA: Insufficient documentation

## 2022-10-29 DIAGNOSIS — D72829 Elevated white blood cell count, unspecified: Secondary | ICD-10-CM | POA: Insufficient documentation

## 2022-10-29 DIAGNOSIS — K219 Gastro-esophageal reflux disease without esophagitis: Secondary | ICD-10-CM | POA: Diagnosis present

## 2022-10-29 DIAGNOSIS — M25551 Pain in right hip: Secondary | ICD-10-CM | POA: Insufficient documentation

## 2022-10-29 DIAGNOSIS — E669 Obesity, unspecified: Secondary | ICD-10-CM | POA: Diagnosis present

## 2022-10-29 DIAGNOSIS — A040 Enteropathogenic Escherichia coli infection: Secondary | ICD-10-CM | POA: Insufficient documentation

## 2022-10-29 DIAGNOSIS — R0602 Shortness of breath: Secondary | ICD-10-CM | POA: Insufficient documentation

## 2022-10-29 DIAGNOSIS — Z9049 Acquired absence of other specified parts of digestive tract: Secondary | ICD-10-CM | POA: Diagnosis present

## 2022-10-29 DIAGNOSIS — E781 Pure hyperglyceridemia: Secondary | ICD-10-CM | POA: Diagnosis present

## 2022-10-29 DIAGNOSIS — R0600 Dyspnea, unspecified: Secondary | ICD-10-CM

## 2022-10-29 DIAGNOSIS — N179 Acute kidney failure, unspecified: Secondary | ICD-10-CM | POA: Diagnosis present

## 2022-10-29 DIAGNOSIS — E1122 Type 2 diabetes mellitus with diabetic chronic kidney disease: Secondary | ICD-10-CM | POA: Diagnosis present

## 2022-10-29 DIAGNOSIS — I5032 Chronic diastolic (congestive) heart failure: Secondary | ICD-10-CM | POA: Diagnosis present

## 2022-10-29 DIAGNOSIS — F419 Anxiety disorder, unspecified: Secondary | ICD-10-CM | POA: Diagnosis present

## 2022-10-29 DIAGNOSIS — J4489 Other specified chronic obstructive pulmonary disease: Secondary | ICD-10-CM | POA: Diagnosis present

## 2022-10-29 DIAGNOSIS — I130 Hypertensive heart and chronic kidney disease with heart failure and stage 1 through stage 4 chronic kidney disease, or unspecified chronic kidney disease: Secondary | ICD-10-CM | POA: Diagnosis present

## 2022-10-29 DIAGNOSIS — Z20822 Contact with and (suspected) exposure to covid-19: Secondary | ICD-10-CM | POA: Insufficient documentation

## 2022-10-29 DIAGNOSIS — M549 Dorsalgia, unspecified: Secondary | ICD-10-CM | POA: Insufficient documentation

## 2022-10-29 DIAGNOSIS — N1831 Chronic kidney disease, stage 3a (CMS HCC): Secondary | ICD-10-CM | POA: Diagnosis present

## 2022-10-29 DIAGNOSIS — G8929 Other chronic pain: Secondary | ICD-10-CM | POA: Diagnosis present

## 2022-10-29 DIAGNOSIS — R0902 Hypoxemia: Secondary | ICD-10-CM | POA: Diagnosis present

## 2022-10-29 DIAGNOSIS — M25552 Pain in left hip: Secondary | ICD-10-CM | POA: Insufficient documentation

## 2022-10-29 DIAGNOSIS — E7800 Pure hypercholesterolemia, unspecified: Secondary | ICD-10-CM | POA: Diagnosis present

## 2022-10-29 DIAGNOSIS — M315 Giant cell arteritis with polymyalgia rheumatica: Principal | ICD-10-CM | POA: Diagnosis present

## 2022-10-29 DIAGNOSIS — Z79899 Other long term (current) drug therapy: Secondary | ICD-10-CM

## 2022-10-29 DIAGNOSIS — Z6837 Body mass index (BMI) 37.0-37.9, adult: Secondary | ICD-10-CM

## 2022-10-29 DIAGNOSIS — I2510 Atherosclerotic heart disease of native coronary artery without angina pectoris: Secondary | ICD-10-CM | POA: Diagnosis present

## 2022-10-29 DIAGNOSIS — I959 Hypotension, unspecified: Secondary | ICD-10-CM | POA: Diagnosis not present

## 2022-10-29 DIAGNOSIS — F32A Depression, unspecified: Secondary | ICD-10-CM | POA: Diagnosis present

## 2022-10-29 DIAGNOSIS — Z7982 Long term (current) use of aspirin: Secondary | ICD-10-CM | POA: Insufficient documentation

## 2022-10-29 DIAGNOSIS — K529 Noninfective gastroenteritis and colitis, unspecified: Secondary | ICD-10-CM | POA: Diagnosis present

## 2022-10-29 DIAGNOSIS — R109 Unspecified abdominal pain: Secondary | ICD-10-CM | POA: Insufficient documentation

## 2022-10-29 LAB — BASIC METABOLIC PANEL
ANION GAP: 9 mmol/L (ref 4–13)
BUN/CREA RATIO: 12 (ref 6–22)
BUN: 15 mg/dL (ref 8–25)
CALCIUM: 9.9 mg/dL (ref 8.6–10.3)
CHLORIDE: 96 mmol/L (ref 96–111)
CO2 TOTAL: 29 mmol/L (ref 23–31)
CREATININE: 1.28 mg/dL — ABNORMAL HIGH (ref 0.60–1.05)
ESTIMATED GFR - FEMALE: 43 mL/min/BSA — ABNORMAL LOW (ref >=60)
GLUCOSE: 148 mg/dL — ABNORMAL HIGH (ref 65–125)
POTASSIUM: 4 mmol/L (ref 3.5–5.1)
SODIUM: 134 mmol/L — ABNORMAL LOW (ref 136–145)

## 2022-10-29 LAB — CBC WITH DIFF
BASOPHIL #: 0 10*3/uL (ref 0.00–0.20)
BASOPHIL %: 0 % (ref 0–2)
EOSINOPHIL #: 0.1 10*3/uL (ref 0.00–4.00)
EOSINOPHIL %: 0 % (ref 0–4)
HCT: 38.5 % (ref 36.0–47.0)
HGB: 12.7 g/dL (ref 12.0–15.0)
LYMPHOCYTE #: 1.4 10*3/uL (ref 1.00–3.80)
LYMPHOCYTE %: 11 % — ABNORMAL LOW (ref 20–35)
MCH: 30.1 pg (ref 27.0–32.0)
MCHC: 32.9 g/dL (ref 32.0–36.0)
MCV: 91.5 fL (ref 80.0–100.0)
MONOCYTE #: 0.8 10*3/uL (ref 0.00–1.40)
MONOCYTE %: 6 % (ref 3–13)
MPV: 9.7 fL — ABNORMAL HIGH (ref 6.6–9.3)
NEUTROPHIL #: 11.1 10*3/uL — ABNORMAL HIGH (ref 2.40–7.60)
NEUTROPHIL %: 83 % — ABNORMAL HIGH (ref 50–70)
PLATELETS: 251 10*3/uL (ref 140–450)
RBC: 4.2 10*6/uL (ref 4.20–5.40)
RDW: 13.9 % (ref 12.0–15.0)
WBC: 13.4 10*3/uL — ABNORMAL HIGH (ref 4.8–10.8)

## 2022-10-29 LAB — ECG 12-LEAD
Atrial Rate: 90 {beats}/min
Atrial Rate: 93 {beats}/min
Calculated P Axis: 47 degrees
Calculated P Axis: 48 degrees
Calculated R Axis: -43 degrees
Calculated R Axis: -40 degrees
Calculated T Axis: 90 degrees
Calculated T Axis: 92 degrees
PR Interval: 158 ms
PR Interval: 178 ms
QRS Duration: 114 ms
QRS Duration: 112 ms
QT Interval: 378 ms
QT Interval: 378 ms
QTC Calculation: 462 ms
QTC Calculation: 469 ms
Ventricular rate: 90 {beats}/min
Ventricular rate: 93 {beats}/min

## 2022-10-29 LAB — HEPATIC FUNCTION PANEL
ALBUMIN: 3.4 g/dL (ref 3.4–4.8)
ALKALINE PHOSPHATASE: 111 U/L (ref 55–145)
ALT (SGPT): 16 U/L (ref 8–22)
AST (SGOT): 17 U/L (ref 8–45)
BILIRUBIN DIRECT: 0.3 mg/dL (ref 0.1–0.4)
BILIRUBIN TOTAL: 0.8 mg/dL (ref 0.3–1.3)
PROTEIN TOTAL: 8.3 g/dL — ABNORMAL HIGH (ref 6.0–8.0)

## 2022-10-29 LAB — COVID-19 ~~LOC~~ MOLECULAR LAB TESTING
INFLUENZA VIRUS TYPE A: NOT DETECTED
INFLUENZA VIRUS TYPE B: NOT DETECTED
RESPIRATORY SYNCTIAL VIRUS (RSV): NOT DETECTED
SARS-CoV-2: NOT DETECTED

## 2022-10-29 LAB — PT/INR
INR: 1.19 (ref 0.80–4.00)
PROTHROMBIN TIME: 13.5 seconds — ABNORMAL HIGH (ref 9.4–12.5)

## 2022-10-29 LAB — B-TYPE NATRIURETIC PEPTIDE (BNP),PLASMA: BNP: 98 pg/mL (ref <=99)

## 2022-10-29 LAB — TROPONIN-I
TROPONIN-I HS: 3.9 ng/L (ref <=14.0)
TROPONIN-I HS: 5.1 ng/L (ref <=14.0)

## 2022-10-29 LAB — PTT (PARTIAL THROMBOPLASTIN TIME): APTT: 28.5 seconds (ref 25.1–36.5)

## 2022-10-29 LAB — MAGNESIUM: MAGNESIUM: 2.2 mg/dL (ref 1.8–2.6)

## 2022-10-29 MED ORDER — SODIUM CHLORIDE 0.9 % IV BOLUS
100.0000 mL | INJECTION | Status: AC
Start: 2022-10-29 — End: 2022-11-01
  Administered 2022-11-01: 100 mL via INTRAVENOUS
  Administered 2022-11-01: 0 mL via INTRAVENOUS
  Filled 2022-10-29: qty 100

## 2022-10-29 MED ORDER — ONDANSETRON HCL (PF) 4 MG/2 ML INJECTION SOLUTION
INTRAMUSCULAR | Status: AC
Start: 2022-10-29 — End: 2022-10-29
  Administered 2022-10-29: 4 mg via INTRAVENOUS
  Filled 2022-10-29: qty 2

## 2022-10-29 MED ORDER — ONDANSETRON HCL (PF) 4 MG/2 ML INJECTION SOLUTION
4.0000 mg | INTRAMUSCULAR | Status: AC
Start: 2022-10-29 — End: 2022-10-29

## 2022-10-29 MED ORDER — ACETAMINOPHEN 500 MG TABLET
500.0000 mg | ORAL_TABLET | Freq: Every evening | ORAL | Status: DC | PRN
Start: 2022-10-29 — End: 2022-11-01
  Administered 2022-10-29 – 2022-10-31 (×2): 500 mg via ORAL
  Filled 2022-10-29 (×2): qty 1

## 2022-10-29 MED ORDER — DOXYCYCLINE HYCLATE 100 MG CAPSULE
100.0000 mg | ORAL_CAPSULE | ORAL | Status: AC
Start: 2022-10-29 — End: 2022-10-29
  Administered 2022-10-29: 100 mg via ORAL
  Filled 2022-10-29: qty 1

## 2022-10-29 MED ORDER — PANTOPRAZOLE 40 MG TABLET,DELAYED RELEASE
40.0000 mg | DELAYED_RELEASE_TABLET | Freq: Every day | ORAL | Status: DC
Start: 2022-10-30 — End: 2022-11-01
  Administered 2022-10-30 – 2022-11-01 (×3): 40 mg via ORAL
  Filled 2022-10-29 (×3): qty 1

## 2022-10-29 MED ORDER — KETOROLAC 30 MG/ML (1 ML) INJECTION SOLUTION
15.0000 mg | INTRAMUSCULAR | Status: AC
Start: 2022-10-30 — End: 2022-10-30
  Administered 2022-10-30: 15 mg via INTRAVENOUS
  Filled 2022-10-29: qty 1

## 2022-10-29 MED ORDER — PREDNISONE 10 MG TABLET
5.0000 mg | ORAL_TABLET | Freq: Every morning | ORAL | Status: DC
Start: 2022-10-30 — End: 2022-10-30
  Administered 2022-10-30: 5 mg via ORAL
  Filled 2022-10-29: qty 1

## 2022-10-29 MED ORDER — CHOLECALCIFEROL (VITAMIN D3) 25 MCG (1,000 UNIT) TABLET
2000.0000 [IU] | ORAL_TABLET | Freq: Every day | ORAL | Status: DC
Start: 2022-10-30 — End: 2022-11-01
  Administered 2022-10-30 – 2022-11-01 (×3): 2000 [IU] via ORAL
  Filled 2022-10-29 (×3): qty 2

## 2022-10-29 MED ORDER — DOXYCYCLINE HYCLATE 100 MG CAPSULE
100.0000 mg | ORAL_CAPSULE | Freq: Two times a day (BID) | ORAL | Status: DC
Start: 2022-10-30 — End: 2022-10-31
  Administered 2022-10-30 – 2022-10-31 (×3): 100 mg via ORAL
  Filled 2022-10-29 (×3): qty 1

## 2022-10-29 MED ORDER — CEFTRIAXONE 1 GRAM/50 ML IN DEXTROSE (ISO-OSMOT) INTRAVENOUS PIGGYBACK
1.0000 g | INJECTION | INTRAVENOUS | Status: AC
Start: 2022-10-29 — End: 2022-10-29
  Administered 2022-10-29: 0 g via INTRAVENOUS
  Administered 2022-10-29: 1 g via INTRAVENOUS
  Filled 2022-10-29: qty 50

## 2022-10-29 MED ORDER — LOSARTAN 50 MG TABLET
75.0000 mg | ORAL_TABLET | Freq: Every day | ORAL | Status: DC
Start: 2022-10-30 — End: 2022-10-31
  Administered 2022-10-30: 75 mg via ORAL
  Administered 2022-10-31: 0 mg via ORAL
  Filled 2022-10-29: qty 2

## 2022-10-29 MED ORDER — ONDANSETRON HCL (PF) 4 MG/2 ML INJECTION SOLUTION
4.0000 mg | Freq: Three times a day (TID) | INTRAMUSCULAR | Status: DC | PRN
Start: 2022-10-29 — End: 2022-11-01
  Administered 2022-10-30: 4 mg via INTRAVENOUS
  Filled 2022-10-29: qty 2

## 2022-10-29 MED ORDER — EZETIMIBE 10 MG TABLET
10.0000 mg | ORAL_TABLET | Freq: Every evening | ORAL | Status: DC
Start: 2022-10-30 — End: 2022-11-01
  Administered 2022-10-30 – 2022-10-31 (×2): 10 mg via ORAL
  Filled 2022-10-29 (×2): qty 1

## 2022-10-29 MED ORDER — IPRATROPIUM 0.5 MG-ALBUTEROL 3 MG (2.5 MG BASE)/3 ML NEBULIZATION SOLN
3.0000 mL | INHALATION_SOLUTION | Freq: Four times a day (QID) | RESPIRATORY_TRACT | Status: DC
Start: 2022-10-29 — End: 2022-11-01
  Administered 2022-10-29 – 2022-10-30 (×5): 3 mL via RESPIRATORY_TRACT
  Administered 2022-10-31 (×2): 0 mL via RESPIRATORY_TRACT
  Administered 2022-10-31 – 2022-11-01 (×3): 3 mL via RESPIRATORY_TRACT
  Filled 2022-10-29 (×8): qty 3

## 2022-10-29 MED ORDER — IOPAMIDOL 370 MG IODINE/ML (76 %) INTRAVENOUS SOLUTION
80.0000 mL | INTRAVENOUS | Status: AC
Start: 2022-10-29 — End: 2022-10-29
  Administered 2022-10-29: 80 mL via INTRAVENOUS
  Filled 2022-10-29: qty 100

## 2022-10-29 MED ORDER — INSULIN LISPRO 100 UNIT/ML SUB-Q SSIP - RMH
0.0000 [IU] | INJECTION | Freq: Four times a day (QID) | SUBCUTANEOUS | Status: DC | PRN
Start: 2022-10-29 — End: 2022-11-01
  Administered 2022-10-30: 4 [IU] via SUBCUTANEOUS
  Administered 2022-10-30: 2 [IU] via SUBCUTANEOUS
  Administered 2022-10-30 – 2022-10-31 (×2): 4 [IU] via SUBCUTANEOUS
  Administered 2022-10-31 (×2): 2 [IU] via SUBCUTANEOUS
  Filled 2022-10-29 (×2): qty 2
  Filled 2022-10-29: qty 4
  Filled 2022-10-29: qty 2
  Filled 2022-10-29 (×2): qty 4

## 2022-10-29 MED ORDER — AMLODIPINE 5 MG TABLET
2.5000 mg | ORAL_TABLET | Freq: Every day | ORAL | Status: DC
Start: 2022-10-30 — End: 2022-10-31
  Administered 2022-10-30: 2.5 mg via ORAL
  Administered 2022-10-31: 0 mg via ORAL
  Filled 2022-10-29: qty 1

## 2022-10-29 MED ORDER — ASPIRIN 81 MG TABLET,DELAYED RELEASE
81.0000 mg | DELAYED_RELEASE_TABLET | Freq: Every day | ORAL | Status: DC
Start: 2022-10-30 — End: 2022-11-01
  Administered 2022-10-30 – 2022-11-01 (×3): 81 mg via ORAL
  Filled 2022-10-29 (×3): qty 1

## 2022-10-29 MED ORDER — SODIUM CHLORIDE 0.9 % INTRAVENOUS PIGGYBACK
2.0000 g | INTRAVENOUS | Status: DC
Start: 2022-10-30 — End: 2022-10-31
  Administered 2022-10-30: 2 g via INTRAVENOUS
  Administered 2022-10-30: 0 g via INTRAVENOUS
  Filled 2022-10-29: qty 2

## 2022-10-29 MED ORDER — TIZANIDINE 4 MG TABLET
4.0000 mg | ORAL_TABLET | Freq: Three times a day (TID) | ORAL | Status: DC
Start: 2022-10-29 — End: 2022-11-01
  Administered 2022-10-29: 0 mg via ORAL
  Administered 2022-10-30 – 2022-11-01 (×7): 4 mg via ORAL
  Filled 2022-10-29 (×7): qty 1

## 2022-10-29 MED ORDER — FLUOXETINE 10 MG CAPSULE
20.0000 mg | ORAL_CAPSULE | Freq: Every day | ORAL | Status: DC
Start: 2022-10-30 — End: 2022-11-01
  Administered 2022-10-30 – 2022-11-01 (×3): 20 mg via ORAL
  Filled 2022-10-29 (×3): qty 2

## 2022-10-29 MED ORDER — METOPROLOL SUCCINATE ER 25 MG TABLET,EXTENDED RELEASE 24 HR
25.0000 mg | ORAL_TABLET | Freq: Every evening | ORAL | Status: DC
Start: 2022-10-29 — End: 2022-11-01
  Administered 2022-10-29 – 2022-10-31 (×3): 25 mg via ORAL
  Filled 2022-10-29 (×3): qty 1

## 2022-10-29 MED ORDER — HYDROCODONE 5 MG-ACETAMINOPHEN 325 MG TABLET
1.0000 | ORAL_TABLET | ORAL | Status: AC
Start: 2022-10-29 — End: 2022-10-29
  Administered 2022-10-29: 1 via ORAL
  Filled 2022-10-29: qty 1

## 2022-10-29 MED ORDER — FENTANYL (PF) 50 MCG/ML INJECTION WRAPPER
25.0000 ug | INJECTION | Freq: Four times a day (QID) | INTRAMUSCULAR | Status: DC | PRN
Start: 2022-10-29 — End: 2022-10-30

## 2022-10-29 NOTE — ED Nurses Note (Signed)
IV placed and blood drawn as ordered. Patient very anxious. Educated patient on slowing her breathing down. EKG completed and x-ray in to do CXR. Will continue to assess.

## 2022-10-29 NOTE — ED Provider Notes (Signed)
Department of Emergency Medicine  [facility]  10/29/2022      Patient name: Joanna Black  Patient DOB: 12-Aug-1946    History provided by patient    Patient is a 76 y.o.  female presenting to the ED with chief complaint of shortness of breath.   Patient reports worsening shortness of breath for 1-2 days, worse on exertion or lying flat, with mild nonproductive cough, mild pain in neck, back, and bilateral ribs. Denies known covid exposure.    Review of Systems:  All other symptoms reviewed and are negative, unless commented on in the HPI.     Past Medical History:  Past Medical History:  Past Medical History:   Diagnosis Date    Arthritis     Asthma     Back problem     BMI 35.0-35.9,adult 02/16/2020    CAD (coronary artery disease)     mild    Cataract     Cataracts, bilateral     CHF (congestive heart failure) (CMS HCC)     Chronic bronchitis with emphysema     Chronic low back pain     Claustrophobia     Depression     Diabetes (CMS HCC)     Diabetes mellitus, type 2 (CMS HCC)     Esophageal reflux     Essential hypertension 01/28/2019    H/O urinary tract infection     HH (hiatus hernia)     History of kidney disease     states, "low kidney functions."    Hypercholesterolemia     Hypertension     Hypertriglyceridemia     Type 2 diabetes mellitus (CMS HCC)        Past Surgical History:  Past Surgical History:   Procedure Laterality Date    Cataract extraction, bilateral Bilateral     Colonoscopy      Esophagogastroduodenoscopy      Hx carpal tunnel release      Hx cataract removal Right 12/30/2019    Hx cataract removal Left 01/06/2020    Hx cholecystectomy      Hx exposure to metal shavings      Hx heart catheterization      Hx hysterectomy      Hx oophorectomy      Hx tah and bso      Hx tubal ligation      Hx vein stripping         Social History:  Social History     Tobacco Use    Smoking status: Never     Passive exposure: Never    Smokeless tobacco: Never    Vaping Use    Vaping Use: Never used   Substance Use Topics    Alcohol use: No     Alcohol/week: 0.0 standard drinks of alcohol    Drug use: No     Social History     Substance and Sexual Activity   Drug Use No       Family History:  Family History   Problem Relation Age of Onset    No Known Problems Mother     Bone cancer Father     Diabetes Multiple family members     Hypertension (High Blood Pressure) Multiple family members     Stomach Cancer Paternal Grandmother          Above history reviewed.  Allergies, medication list, and old records also reviewed.     Physical Exam:  Filed Vitals:    10/29/22 1614   BP: (!) 155/89   Pulse: 93   Resp: (!) 26   Temp: 37.2 C (99 F)   SpO2: 95%     Nursing note and vitals reviewed.  Vital signs reviewed as above. No acute distress.   Constitutional: Patient is well-developed and well-nourished.  Head: Normocephalic and atraumatic.   Eyes: Conjunctivae are normal. Pupils are equal, round, and reactive to light. EOM are intact  Neck: Soft, supple, full range of motion.  Cardiovascular: RRR.  No Murmurs/rubs/gallops. Distal pulses present and equal bilaterally.  Pulmonary/Chest: Normal breath sounds bilaterally with no distress. No audible wheezes or crackles are noted.  Abdominal: Soft, nontender, nondistended.  No rebound, guarding, or masses.  Musculoskeletal: Normal range of motion. No deformities.  No edema and no tenderness.   Neurological: CNs 2-12 grossly intact.  No focal deficits noted.  Skin: Warm and dry. No rash or lesions  Psychiatric: Patient has a normal mood and affect.   GCS = 15    Workup:     Labs:  Results for orders placed or performed during the hospital encounter of 10/29/22 (from the past 24 hour(s))   CBC/DIFF    Narrative    The following orders were created for panel order CBC/DIFF.  Procedure                               Abnormality         Status                     ---------                               -----------         ------                      CBC WITH JKDT[[id number]]                Abnormal            Final result                 Please view results for these tests on the individual orders.   BASIC METABOLIC PANEL   Result Value Ref Range    SODIUM 134 (L) 136 - 145 mmol/L    POTASSIUM 4.0 3.5 - 5.1 mmol/L    CHLORIDE 96 96 - 111 mmol/L    CO2 TOTAL 29 23 - 31 mmol/L    ANION GAP 9 4 - 13 mmol/L    CALCIUM 9.9 8.6 - 10.3 mg/dL    GLUCOSE 998 (H) 65 - 125 mg/dL    BUN 15 8 - 25 mg/dL    CREATININE 3.38 (H) 0.60 - 1.05 mg/dL    BUN/CREA RATIO 12 6 - 22    ESTIMATED GFR - FEMALE 43 (L) >=60 mL/min/BSA   HEPATIC FUNCTION PANEL   Result Value Ref Range    ALBUMIN 3.4 3.4 - 4.8 g/dL     ALKALINE PHOSPHATASE 111 55 - 145 U/L    ALT (SGPT) 16 8 - 22 U/L    AST (SGOT)  17 8 - 45 U/L    BILIRUBIN TOTAL 0.8 0.3 - 1.3 mg/dL    BILIRUBIN DIRECT 0.3 0.1 - 0.4 mg/dL    PROTEIN TOTAL 8.3 (H)  6.0 - 8.0 g/dL   MAGNESIUM   Result Value Ref Range    MAGNESIUM 2.2 1.8 - 2.6 mg/dL   B-TYPE NATRIURETIC PEPTIDE   Result Value Ref Range    BNP 98 <=99 pg/mL   PT/INR   Result Value Ref Range    PROTHROMBIN TIME 13.5 (H) 9.4 - 12.5 seconds    INR 1.19 0.80 - 4.00    Narrative    Coumadin therapy INR range for Conventional Anticoagulation is 2.0 to 3.0 and for Intensive Anticoagulation 2.5 to 3.5.   PTT (PARTIAL THROMBOPLASTIN TIME)   Result Value Ref Range    APTT 28.5 25.1 - 36.5 seconds   TROPONIN-I   Result Value Ref Range    TROPONIN-I HS 3.9 <=14.0 ng/L ng/L   COVID - 19 SCREENING - Symptomatic - PUI   Result Value Ref Range    SARS-CoV-2 Not Detected Not Detected    INFLUENZA VIRUS TYPE A Not Detected Not Detected    INFLUENZA VIRUS TYPE B Not Detected Not Detected    RESPIRATORY SYNCTIAL VIRUS (RSV) Not Detected Not Detected    Narrative    Results are for the simultaneous qualitative identification of SARS-CoV-2 (formerly 2019-nCoV), Influenza A, Influenza B, and RSV RNA. These etiologic agents are generally detectable in nasopharyngeal and nasal swabs during the  ACUTE PHASE of infection. Hence, this test is intended to be performed on respiratory specimens collected from individuals with signs and symptoms of upper respiratory tract infection who meet Centers for Disease Control and Prevention (CDC) clinical and/or epidemiological criteria for Coronavirus Disease 2019 (COVID-19) testing. CDC COVID-19 criteria for testing on human specimens is available at [facility] webpage information for Healthcare Professionals: Coronavirus Disease 2019 (COVID-19) ([email or web address]).     False-negative results may occur if the virus has genomic mutations, insertions, deletions, or rearrangements or if performed very early in the course of illness. Otherwise, negative results indicate virus specific RNA targets are not detected, however negative results do not preclude SARS-CoV-2 infection/COVID-19, Influenza, or Respiratory syncytial virus infection. Results should not be used as the sole basis for patient management decisions. Negative results must be combined with clinical observations, patient history, and epidemiological information. If upper respiratory tract infection is still suspected based on exposure history together with other clinical findings, re-testing should be considered.    Disclaimer:   This assay has been authorized by FDA under an Emergency Use Authorization for use in laboratories certified under the Clinical Laboratory Improvement Amendments of 1988 (CLIA), 42 U.S.C. [phone], to perform high complexity tests. The impacts of vaccines, antiviral therapeutics, antibiotics, chemotherapeutic or immunosuppressant drugs have not been evaluated.     Test methodology:   Cepheid Xpert Xpress SARS-CoV-2/Flu/RSV Assay real-time polymerase chain reaction (RT-PCR) test on the GeneXpert Dx and Xpert Xpress systems.   CBC WITH DIFF   Result Value Ref Range    WBC 13.4 (H) 4.8 - 10.8 x10^3/uL    RBC 4.20 4.20 - 5.40 x10^6/uL    HGB 12.7 12.0 - 15.0  g/dL    HCT 09.3 81.8 - 29.9 %    MCV 91.5 80.0 - 100.0 fL    MCH 30.1 27.0 - 32.0 pg    MCHC 32.9 32.0 - 36.0 g/dL    RDW 37.1 69.6 - 78.9 %    PLATELETS 251 140 - 450 x10^3/uL    MPV 9.7 (H) 6.6 - 9.3 fL    NEUTROPHIL % 83 (H) 50 - 70 %    LYMPHOCYTE % 11 (L)  20 - 35 %    MONOCYTE % 6 3 - 13 %    EOSINOPHIL % 0 0 - 4 %    BASOPHIL % 0 0 - 2 %    NEUTROPHIL # 11.10 (H) 2.40 - 7.60 x10^3/uL    LYMPHOCYTE # 1.40 1.00 - 3.80 x10^3/uL    MONOCYTE # 0.80 0.00 - 1.40 x10^3/uL    EOSINOPHIL # 0.10 0.00 - 4.00 x10^3/uL    BASOPHIL # 0.00 0.00 - 0.20 x10^3/uL       Imaging:    Results for orders placed or performed during the hospital encounter of 10/29/22 (from the past 72 hour(s))   XR AP MOBILE CHEST     Status: None    Narrative    Thora R Goecke    PROCEDURE DESCRIPTION: XR AP MOBILE CHEST    CLINICAL INDICATION: SOB      TECHNIQUE:  Portable AP chest at 1718 hours.    COMPARISON: Chest x-ray 09/08/2022    FINDINGS:   Heart: Normal.  Mediastinum: No mass or widening.  Hila: Normal, no mass.  Pulmonary vascularity: Normal  Lungs: No mass or airspace opacity.  Pleura: No pleural effusion.  No pneumothorax.  Bones: No osseous lesion or acute fracture visualized.          Impression    No acute cardiopulmonary process.             Radiologist location ID: [id number]         Orders Placed This Encounter    XR AP MOBILE CHEST    CBC/DIFF    BASIC METABOLIC PANEL    HEPATIC FUNCTION PANEL    MAGNESIUM    B-TYPE NATRIURETIC PEPTIDE    PT/INR    PTT (PARTIAL THROMBOPLASTIN TIME)    TROPONIN-I    COVID - 19 SCREENING - Symptomatic - PUI    CBC WITH DIFF    OXYGEN - NASAL CANNULA    ECG 12-LEAD    INSERT & MAINTAIN PERIPHERAL IV ACCESS    PERIPHERAL IV DRESSING CHANGE    HYDROcodone-acetaminophen (NORCO) 5-325 mg per tablet       Abnormal Lab results:  Labs Ordered/Reviewed   BASIC METABOLIC PANEL - Abnormal; Notable for the following components:       Result Value    SODIUM 134 (*)     GLUCOSE 148 (*)     CREATININE 1.28 (*)      ESTIMATED GFR - FEMALE 43 (*)     All other components within normal limits   HEPATIC FUNCTION PANEL - Abnormal; Notable for the following components:    PROTEIN TOTAL 8.3 (*)     All other components within normal limits   PT/INR - Abnormal; Notable for the following components:    PROTHROMBIN TIME 13.5 (*)     All other components within normal limits    Narrative:     Coumadin therapy INR range for Conventional Anticoagulation is 2.0 to 3.0 and for Intensive Anticoagulation 2.5 to 3.5.   CBC WITH DIFF - Abnormal; Notable for the following components:    WBC 13.4 (*)     MPV 9.7 (*)     NEUTROPHIL % 83 (*)     LYMPHOCYTE % 11 (*)     NEUTROPHIL # 11.10 (*)     All other components within normal limits   MAGNESIUM - Normal   B-TYPE NATRIURETIC PEPTIDE - Normal   PTT (PARTIAL THROMBOPLASTIN TIME) - Normal   TROPONIN-I -  Normal   COVID-19 [place] MOLECULAR LAB TESTING - Normal    Narrative:     Results are for the simultaneous qualitative identification of SARS-CoV-2 (formerly 2019-nCoV), Influenza A, Influenza B, and RSV RNA. These etiologic agents are generally detectable in nasopharyngeal and nasal swabs during the ACUTE PHASE of infection. Hence, this test is intended to be performed on respiratory specimens collected from individuals with signs and symptoms of upper respiratory tract infection who meet Centers for Disease Control and Prevention (CDC) clinical and/or epidemiological criteria for Coronavirus Disease 2019 (COVID-19) testing. CDC COVID-19 criteria for testing on human specimens is available at [facility] webpage information for Healthcare Professionals: Coronavirus Disease 2019 (COVID-19) ([email or web address]).     False-negative results may occur if the virus has genomic mutations, insertions, deletions, or rearrangements or if performed very early in the course of illness. Otherwise, negative results indicate virus specific RNA targets are not detected, however  negative results do not preclude SARS-CoV-2 infection/COVID-19, Influenza, or Respiratory syncytial virus infection. Results should not be used as the sole basis for patient management decisions. Negative results must be combined with clinical observations, patient history, and epidemiological information. If upper respiratory tract infection is still suspected based on exposure history together with other clinical findings, re-testing should be considered.    Disclaimer:   This assay has been authorized by FDA under an Emergency Use Authorization for use in laboratories certified under the Clinical Laboratory Improvement Amendments of 1988 (CLIA), 42 U.S.C. [phone], to perform high complexity tests. The impacts of vaccines, antiviral therapeutics, antibiotics, chemotherapeutic or immunosuppressant drugs have not been evaluated.     Test methodology:   Cepheid Xpert Xpress SARS-CoV-2/Flu/RSV Assay real-time polymerase chain reaction (RT-PCR) test on the GeneXpert Dx and Xpert Xpress systems.   CBC/DIFF    Narrative:     The following orders were created for panel order CBC/DIFF.  Procedure                               Abnormality         Status                     ---------                               -----------         ------                     CBC WITH RUEA[[id number]]                Abnormal            Final result                 Please view results for these tests on the individual orders.       ECG: reviewed.   Most Recent EKG Results in Last 30 Days   ECG 12-LEAD    Collection Time: 10/29/22  5:09 PM   Result Value Ref Range    Ventricular rate 90 BPM    Atrial Rate 90 BPM    PR Interval 158 ms    QRS Duration 114 ms    QT Interval 378 ms    QTC Calculation 462 ms    Calculated P Axis 47 degrees    Calculated R Axis -43 degrees    Calculated  T Axis 90 degrees    Narrative    Normal sinus rhythm  Left axis deviation  Left ventricular hypertrophy with repolarization abnormality ( R in aVL , [employer] product  )  Cannot rule out Septal infarct , age undetermined  Abnormal ECG  When compared with ECG of 08-Sep-2022 17:59,  Incomplete left bundle branch block is no longer present  Minimal criteria for Septal infarct are now present  Confirmed by Gregary Signs ([id number]) on 10/29/2022 6:28:10 PM          MDM:   During the patient's stay in the emergency department, the above listed imaging and/or labs were performed to assist with medical decision making and were reviewed by myself when available for review.     Medical Decision Making  Amount and/or Complexity of Data Reviewed  Labs: ordered.  Radiology: ordered.  ECG/medicine tests: ordered.    Risk  Prescription drug management.        Patient remained stable throughout the emergency department course.      Impression:   SOB and chest pain    Plan:   Workup in progress  Care of patient to be transferred to Dr. Farrel Demark at 1900 today    Disposition: refer to Dr. Gardiner Sleeper note for final dispo of patient      Gregary Signs, MD  10/29/2022, 17:01

## 2022-10-29 NOTE — Nurses Notes (Signed)
Patient arrived on floor via wheelchair, 3L O2,  accompanied by ED nurse.  Transferred self to bed.

## 2022-10-29 NOTE — ED Nurses Note (Signed)
Patient started vomiting. Provider notified medication order obtained.

## 2022-10-29 NOTE — ED Nurses Note (Signed)
Verbal report given to [facility] on Med Surg.

## 2022-10-29 NOTE — ED Triage Notes (Signed)
Patient presents to the ER c/o shortness of breath that started yesterday and has gotten worse thru the night and today. Attempted to see PCP but unable to get appt. Patient c/o pain with breathing in her neck, back and bilateral rib pain. She has not taken any medication today.

## 2022-10-29 NOTE — ED Nurses Note (Signed)
Pt on O2   No SOB noted   Plan to admit to med surg

## 2022-10-29 NOTE — ED Nurses Note (Signed)
Pt to floor via WC with O2    No SOB noted    IV patent

## 2022-10-29 NOTE — ED Attending Handoff Note (Signed)
[facility] - Emergency Department  Course Note    Patient Name: Joanna Black  Age and Gender: 76 y.o. female  Date of Birth: 01-29-1946  PCP: Samella Parr, DO    After a thorough discussion of the patient including presentation, ED course, and review of above information I have assumed care of Joanna Black from Dr. Bertell Maria at [facility] 10/29/2022    Triage Summary:   Shortness of Breath (X 2 days)      HPI:  In brief, patient is a 76 y.o. White female presenting with shortness of breath, chest pain.  Patient presented for shortness of breath with chest pain x2 days, increased last night into today. Pain worsens with deep breathing and coughing. Cough is nonproductive. Symptoms also worsened with lying flat and exertion. Pain constant and located mostly along bilateral rib cage and into shoulders. No fevers. No recent leg swelling or long travel. No hx of DVT or PE. Takes daily ASA.  Requiring O2 for acute onset hypoxia to high 80's. New for patient.    Pending Studies:  Labs    Plan:  Likely admit    Course:  After assuming care of Joanna Black, ED course included the following:  Labs noting leukocytosis to 13.4, Cr stable at 1.28 and improved from prior. Sodium 134. Trop negative, BNP negative. CXR unremarkable.  CT PE ordered for further assessment. No evidence of PE identified.  On re-assessment, persistent SOB. Patient started on empiric CAP coverage with Rocephin and doxy. Will plan to admit for serial troponins and chest pain eval, as well as new hypoxia.  Patient in agreement. Admitted at this time.    Clinical Impression:     Clinical Impression   Hypoxia (Primary)   Shortness of breath   Leukocytosis, unspecified type       Disposition: Admitted      Patient will be admitted to [facility] inpatient service for further evaluation and management.            /Kaisy Severino "Lorri Frederick, MD 10/29/2022, 20:45   Department of Emergency Medicine  [facility]      *Parts of this  patients chart were completed in a retrospective fashion due to simultaneous direct patient care activities in the Emergency Department.   *This note was partially generated using MModal Fluency Direct system, and there may be some incorrect words, spellings, and punctuation that were not noted in checking the note before saving.

## 2022-10-30 ENCOUNTER — Inpatient Hospital Stay (HOSPITAL_COMMUNITY): Payer: Medicare PPO

## 2022-10-30 DIAGNOSIS — I959 Hypotension, unspecified: Secondary | ICD-10-CM | POA: Diagnosis not present

## 2022-10-30 LAB — BASIC METABOLIC PANEL
ANION GAP: 9 mmol/L (ref 4–13)
BUN/CREA RATIO: 11 (ref 6–22)
BUN: 16 mg/dL (ref 8–25)
CALCIUM: 9.4 mg/dL (ref 8.6–10.3)
CHLORIDE: 96 mmol/L (ref 96–111)
CO2 TOTAL: 27 mmol/L (ref 23–31)
CREATININE: 1.41 mg/dL — ABNORMAL HIGH (ref 0.60–1.05)
ESTIMATED GFR - FEMALE: 38 mL/min/BSA — ABNORMAL LOW (ref >=60)
GLUCOSE: 160 mg/dL — ABNORMAL HIGH (ref 65–125)
POTASSIUM: 4 mmol/L (ref 3.5–5.1)
SODIUM: 132 mmol/L — ABNORMAL LOW (ref 136–145)

## 2022-10-30 LAB — CBC
HCT: 34.5 % — ABNORMAL LOW (ref 36.0–47.0)
HGB: 11.3 g/dL — ABNORMAL LOW (ref 12.0–15.0)
MCH: 29.9 pg (ref 27.0–32.0)
MCHC: 32.7 g/dL (ref 32.0–36.0)
MCV: 91.7 fL (ref 80.0–100.0)
MPV: 9 fL (ref 6.6–9.3)
PLATELETS: 243 10*3/uL (ref 140–450)
RBC: 3.76 10*6/uL — ABNORMAL LOW (ref 4.20–5.40)
RDW: 14.2 % (ref 12.0–15.0)
WBC: 14.8 10*3/uL — ABNORMAL HIGH (ref 4.8–10.8)

## 2022-10-30 LAB — LACTIC ACID - FIRST REFLEX: LACTIC ACID: 2.3 mmol/L — ABNORMAL HIGH (ref 0.5–2.2)

## 2022-10-30 LAB — EXTENDED RESPIRATORY VIRUS PANEL

## 2022-10-30 LAB — TROPONIN-I
TROPONIN-I HS: 4.5 ng/L (ref <=14.0)
TROPONIN-I HS: 5.3 ng/L (ref <=14.0)

## 2022-10-30 LAB — POC BLOOD GLUCOSE (RESULTS)
GLUCOSE, POC: 127 mg/dl — ABNORMAL HIGH (ref 60–110)
GLUCOSE, POC: 195 mg/dl — ABNORMAL HIGH (ref 60–110)
GLUCOSE, POC: 199 mg/dl — ABNORMAL HIGH (ref 60–110)
GLUCOSE, POC: 231 mg/dl — ABNORMAL HIGH (ref 60–110)
GLUCOSE, POC: 240 mg/dl — ABNORMAL HIGH (ref 60–110)

## 2022-10-30 LAB — LACTIC ACID LEVEL W/ REFLEX FOR LEVEL >2.0: LACTIC ACID: 2.2 mmol/L (ref 0.5–2.2)

## 2022-10-30 LAB — SEDIMENTATION RATE: ERYTHROCYTE SEDIMENTATION RATE (ESR): 41 mm/hr — ABNORMAL HIGH (ref <39)

## 2022-10-30 LAB — LACTIC ACID - SECOND REFLEX: LACTIC ACID: 2.6 mmol/L — ABNORMAL HIGH (ref 0.5–2.2)

## 2022-10-30 LAB — C-REACTIVE PROTEIN(CRP),INFLAMMATION: CRP INFLAMMATION: 222.1 mg/L — ABNORMAL HIGH (ref <8.0)

## 2022-10-30 LAB — LAVENDER TOP TUBE

## 2022-10-30 MED ORDER — SODIUM CHLORIDE 0.9 % IV BOLUS
1000.0000 mL | INJECTION | Status: AC
Start: 2022-10-30 — End: 2022-10-30
  Administered 2022-10-30: 1000 mL via INTRAVENOUS
  Administered 2022-10-30: 0 mL via INTRAVENOUS

## 2022-10-30 MED ORDER — PREDNISONE 20 MG TABLET
50.0000 mg | ORAL_TABLET | Freq: Every morning | ORAL | Status: DC
Start: 2022-10-30 — End: 2022-11-01
  Administered 2022-10-30 – 2022-11-01 (×3): 50 mg via ORAL
  Filled 2022-10-30 (×3): qty 1

## 2022-10-30 MED ORDER — SODIUM CHLORIDE 0.9 % INTRAVENOUS SOLUTION
INTRAVENOUS | Status: DC
Start: 2022-10-30 — End: 2022-10-31
  Administered 2022-10-31: 0 mL via INTRAVENOUS

## 2022-10-30 MED ORDER — ENOXAPARIN 30 MG/0.3 ML SUB-Q SYRINGE - EAST
30.0000 mg | INJECTION | Freq: Every day | SUBCUTANEOUS | Status: DC
Start: 2022-10-30 — End: 2022-11-01
  Administered 2022-10-30 – 2022-10-31 (×2): 30 mg via SUBCUTANEOUS
  Administered 2022-11-01: 0 mg via SUBCUTANEOUS
  Filled 2022-10-30 (×3): qty 0.3

## 2022-10-30 MED ORDER — PROCHLORPERAZINE EDISYLATE 10 MG/2 ML (5 MG/ML) INJECTION SOLUTION
10.0000 mg | Freq: Four times a day (QID) | INTRAMUSCULAR | Status: DC | PRN
Start: 2022-10-30 — End: 2022-10-31

## 2022-10-30 NOTE — Care Plan (Signed)
Care plan initiated.  Admitted for hypoxia.  Oriented to room & call system.  C/o pain.  Meds per MAR.  Call light & fluids in reach.  Monitor VS, labs, tele.    Problem: Adult Inpatient Plan of Care  Goal: Plan of Care Review  10/30/2022 0654 by Thurston Hole, RN  Outcome: Ongoing (see interventions/notes)  10/30/2022 0259 by Thurston Hole, RN  Flowsheets (Taken 10/30/2022 0259)  Plan of Care Reviewed With: patient  Goal: Patient-Specific Goal (Individualized)  Outcome: Ongoing (see interventions/notes)  Flowsheets (Taken 10/29/2022 2216)  Individualized Care Needs: not voiced  Anxieties, Fears or Concerns: not voiced  Patient-Specific Goals (Include Timeframe): pain control, go home  Goal: Absence of Hospital-Acquired Illness or Injury  Outcome: Ongoing (see interventions/notes)  Intervention: Identify and Manage Fall Risk  Flowsheets (Taken 10/30/2022 0259)  Safety Promotion/Fall Prevention:   activity supervised   nonskid shoes/slippers when out of bed   fall prevention program maintained   safety round/check completed  Intervention: Prevent Skin Injury  Flowsheets (Taken 10/30/2022 0259)  Body Position: supine, head elevated  Intervention: Prevent and Manage VTE (Venous Thromboembolism) Risk  Flowsheets (Taken 10/30/2022 0259)  VTE Prevention/Management: ambulation promoted  Intervention: Prevent Infection  Flowsheets (Taken 10/30/2022 0259)  Infection Prevention:   promote handwashing   rest/sleep promoted  Goal: Optimal Comfort and Wellbeing  Outcome: Ongoing (see interventions/notes)  Intervention: Monitor Pain and Promote Comfort  Flowsheets (Taken 10/30/2022 0259)  Pain Management Interventions: pain medication given  Intervention: Provide Person-Centered Care  Flowsheets (Taken 10/30/2022 0259)  Trust Relationship/Rapport:   care explained   questions answered   reassurance provided  Goal: Rounds/Family Conference  Outcome: Ongoing (see interventions/notes)     Problem: Health Knowledge, Opportunity to Enhance  (Adult,Obstetrics,Pediatric)  Goal: Knowledgeable about Health Subject/Topic  Description: Patient will demonstrate the desired outcomes by discharge/transition of care.  Outcome: Ongoing (see interventions/notes)  Intervention: Enhance Health Knowledge  Flowsheets (Taken 10/30/2022 0259)  Supportive Measures: active listening utilized  Intervention: Enhance Health Knowledge  Flowsheets (Taken 10/30/2022 0259)  Supportive Measures: active listening utilized     Problem: Fall Injury Risk  Goal: Absence of Fall and Fall-Related Injury  Outcome: Ongoing (see interventions/notes)  Intervention: Identify and Manage Contributors  Flowsheets (Taken 10/30/2022 0259)  Self-Care Promotion:   independence encouraged   BADL personal objects within reach   BADL personal routines maintained  Medication Review/Management: medications reviewed  Intervention: Promote Injury-Free Environment  Flowsheets (Taken 10/30/2022 0259)  Safety Promotion/Fall Prevention:   activity supervised   nonskid shoes/slippers when out of bed   fall prevention program maintained   safety round/check completed     Problem: Gas Exchange Impaired  Goal: Optimal Gas Exchange  Outcome: Ongoing (see interventions/notes)  Intervention: Optimize Oxygenation and Ventilation  Flowsheets (Taken 10/30/2022 0259)  Head of Bed (HOB) Positioning: HOB elevated     Problem: Pain Acute  Goal: Optimal Pain Control and Function  Outcome: Ongoing (see interventions/notes)  Intervention: Develop Pain Management Plan  Flowsheets (Taken 10/30/2022 0259)  Pain Management Interventions: pain medication given  Intervention: Prevent or Manage Pain  Flowsheets (Taken 10/30/2022 0259)  Sleep/Rest Enhancement:   regular sleep/rest pattern promoted   room darkened  Bowel Elimination Promotion: ambulation promoted  Medication Review/Management: medications reviewed  Intervention: Optimize Psychosocial Wellbeing  Flowsheets (Taken 10/30/2022 0259)  Diversional Activities: smartphone  Supportive  Measures: active listening utilized

## 2022-10-30 NOTE — Care Plan (Signed)
Filed Vitals:    10/30/22 1100 10/30/22 1300 10/30/22 1604 10/30/22 1630   BP:  (!) 83/51  (!) 92/45   Pulse:  61  67   Resp: 20 20  20    Temp:  36.2 C (97.1 F)  36.7 C (98 F)   SpO2: 94% 93% 98% 94%     Problem: Adult Inpatient Plan of Care  Goal: Plan of Care Review  Outcome: Ongoing (see interventions/notes)  Goal: Patient-Specific Goal (Individualized)  Outcome: Ongoing (see interventions/notes)  Goal: Absence of Hospital-Acquired Illness or Injury  Outcome: Ongoing (see interventions/notes)  Intervention: Identify and Manage Fall Risk  Recent Flowsheet Documentation  Taken 10/30/2022 1038 by 13/07/2022, LPN  Safety Promotion/Fall Prevention:   activity supervised   fall prevention program maintained   nonskid shoes/slippers when out of bed   motion sensor pad activated   safety round/check completed  Intervention: Prevent Skin Injury  Recent Flowsheet Documentation  Taken 10/30/2022 1038 by 13/07/2022, LPN  Body Position: supine, head elevated  Skin Protection:   transparent dressing maintained   tubing/devices free from skin contact   adhesive use limited  Intervention: Prevent and Manage VTE (Venous Thromboembolism) Risk  Recent Flowsheet Documentation  Taken 10/30/2022 1038 by 13/07/2022, LPN  VTE Prevention/Management: ambulation promoted  Taken 10/30/2022 1030 by 13/07/2022, LPN  VTE Prevention/Management: ambulation promoted  Intervention: Prevent Infection  Recent Flowsheet Documentation  Taken 10/30/2022 1038 by 13/07/2022, LPN  Infection Prevention: single patient room provided  Goal: Optimal Comfort and Wellbeing  Outcome: Ongoing (see interventions/notes)  Goal: Rounds/Family Conference  Outcome: Ongoing (see interventions/notes)     Problem: Health Knowledge, Opportunity to Enhance (Adult,Obstetrics,Pediatric)  Goal: Knowledgeable about Health Subject/Topic  Description: Patient will demonstrate the desired outcomes by discharge/transition of care.  Outcome: Ongoing (see  interventions/notes)     Problem: Fall Injury Risk  Goal: Absence of Fall and Fall-Related Injury  Outcome: Ongoing (see interventions/notes)  Intervention: Promote Laveda Abbe Documentation  Taken 10/30/2022 1038 by 13/07/2022, LPN  Safety Promotion/Fall Prevention:   activity supervised   fall prevention program maintained   nonskid shoes/slippers when out of bed   motion sensor pad activated   safety round/check completed     Problem: Gas Exchange Impaired  Goal: Optimal Gas Exchange  Outcome: Ongoing (see interventions/notes)  Intervention: Optimize Oxygenation and Ventilation  Recent Flowsheet Documentation  Taken 10/30/2022 1038 by 13/07/2022, LPN  Head of Bed [facility]) Positioning: HOB elevated     Problem: Pain Acute  Goal: Optimal Pain Control and Function  Outcome: Ongoing (see interventions/notes)  Intervention: Prevent or Manage Pain  Recent Flowsheet Documentation  Taken 10/30/2022 1038 by 13/07/2022, LPN  Sensory Stimulation Regulation: care clustered  Sleep/Rest Enhancement: regular sleep/rest pattern promoted  Bowel Elimination Promotion:   ambulation promoted   adequate fluid intake promoted  Taken 10/30/2022 1030 by 13/07/2022, LPN  Sensory Stimulation Regulation: care clustered

## 2022-10-30 NOTE — Care Plan (Signed)
[facility]  Physical Therapy     Patient Name: Joanna Black  Date of Birth: [birth year]  Room/Bed: 107/A      Subjective:   Patient referred for PT evaluation and agreeable.    Objective :     10/30/22 1604   Rehab Session   Document Type evaluation   Total PT Minutes: 15   Patient Effort excellent   Symptoms Noted During/After Treatment none   Functional Level Prior   Ambulation 0 - independent   Transferring 0 - independent   Pre Treatment Status   Pre Treatment Patient Status Patient supine in bed   Communication Pre Treatment  Nurse   Communication Pre Treatment Comment supine exercise only at this time per nursing   Vital Signs   Pre-Treatment Heart Rate (beats/min) 64   Post-treatment Heart Rate (beats/min) 68   Pre-Treatment Resp Rate (breaths/min) 19   Post-treatment Resp Rate (breaths/min) 19   RLE Assessment   RLE Assessment X-Exceptions   RLE Strength 4/5   LLE Assessment   LLE Assessment X-Exceptions   LLE Strength 4/5   Therapeutic Exercise/Activity   Comment supine ex:  ankle pumps, heel slides, hip abd/add, and glut sets   Post Treatment Status   Post Treatment Patient Status Patient supine in bed;Call light within reach   Physical Therapy Clinical Impression   Assessment PT evaluation for supine exercise only this date per nursing.  Will further assess transfers and ambulation at next visit.   Criteria for Skilled Therapeutic meets criteria;skilled treatment is necessary   Rehab Potential good   Therapy Frequency 1x/day  (M-F)   Predicted Duration of Therapy Intervention (days/wks) until discharge   Care Plan Goals   PT Rehab Goals Transfer Training Goal;Bed Mobility Goal;Gait Training Goal   Bed Mobility Goal   Bed Mobility Goal, Date Established 10/30/22   Bed Mobility Goal, Time to Achieve by discharge   Bed Mobility Goal, Activity Type all bed mobility activities   Bed Mobility Goal, Independence Level independent   Gait Training  Goal, Distance to Achieve   Gait Training  Goal,  Date Established 10/30/22   Gait Training  Goal, Time to Achieve by discharge   Gait Training  Goal, Independence Level independent   Gait Training  Goal, Distance to Achieve 100 ft   Transfer Training Goal   Transfer Training Goal, Date Established 10/30/22   Transfer Training Goal, Time to Achieve by discharge   Transfer Training Goal, Activity Type bed-to-chair/chair-to-bed;sit-to-stand/stand-to-sit   Transfer Training Goal, Independence Level independent   Planned Therapy Interventions, PT Eval   Planned Therapy Interventions (PT) bed mobility training;gait training;strengthening;transfer training         Assessment:  PT evaluation for supine exercise only this date per nursing.  She did very well with bed exercises.  Will further assess transfers and ambulation at next visit.    Plan:  Will see patient daily, M-F, until discharge for strengthening, transfers, and ambulation.    Oda Cogan, PT

## 2022-10-30 NOTE — H&P (Signed)
[facility]  Hospitalist  History & Physical    Date of Service:  38/01/5052  Joanna, Black, 76 y.o. female  Date of Admission:  10/29/2022  Date of Birth:  1945/12/24  PCP: Samella Parr, DO    Chief Complaint:  Shortness of breath     HPI:  Joanna Black is a 76 y.o. White female who has a past medical history of chronic kidney disease, history of congestive heart failure, hypertension, diabetes mellitus,  and depression. She presented to the ER with complaints of shortness of breath that has been ongoing for the past few days. It worsens when lying flat. She has a cough with clear sputum production. She has complaints of pain that starts in her head, and neck, then goes down her back and to her ribs bilaterally. Describes it as musculoskeletal pain. She also reports of feeling tired the past few days. Denies being around anyone that has been sick. No chest pain, fever, or chills. 02 saturation was high 80's on room air upon arrival. She was placed on 2L NC. CTA chest negative for PE, minor atelectasis in both lungs. Troponin negative. EKG rate 93, left ventricular hypertrophy with repolarization abnormality. She had around 8 episodes of diarrhea after admission, but she reports this is common for her at times since having her gallbladder removed. BP 83/51 this afternoon, she did receive her blood pressure medications today. 1L Bolus ordered.     Past Medical History:   Diagnosis Date    Arthritis     Asthma     Back problem     BMI 35.0-35.9,adult 02/16/2020    CAD (coronary artery disease)     mild    Cataract     Cataracts, bilateral     CHF (congestive heart failure) (CMS HCC)     Chronic bronchitis with emphysema     Chronic low back pain     Claustrophobia     Depression     Diabetes (CMS HCC)     Diabetes mellitus, type 2 (CMS HCC)     Esophageal reflux     Essential hypertension 01/28/2019    H/O urinary tract infection     HH (hiatus hernia)     History of kidney disease     states, "low  kidney functions."    Hypercholesterolemia     Hypertension     Hypertriglyceridemia     Type 2 diabetes mellitus (CMS HCC)       Past Surgical History:   Procedure Laterality Date    CATARACT EXTRACTION, BILATERAL Bilateral     bilateral lens implant    COLONOSCOPY      ESOPHAGOGASTRODUODENOSCOPY      HX CARPAL TUNNEL RELEASE      HX CATARACT REMOVAL Right 12/30/2019    HX CATARACT REMOVAL Left 01/06/2020    HX CHOLECYSTECTOMY      HX EXPOSURE TO METAL SHAVINGS      HX HEART CATHETERIZATION      HX HYSTERECTOMY      HX OOPHORECTOMY      HX TAH AND BSO      HX TUBAL LIGATION      HX VEIN STRIPPING      Left leg      Social History     Tobacco Use    Smoking status: Never     Passive exposure: Never    Smokeless tobacco: Never   Vaping Use    Vaping Use: Never used  Substance Use Topics    Alcohol use: No     Alcohol/week: 0.0 standard drinks of alcohol    Drug use: No       Family Medical History:       Problem Relation (Age of Onset)    Bone cancer Father    Diabetes Multiple family members    Hypertension (High Blood Pressure) Multiple family members    No Known Problems Mother    Stomach Cancer Paternal Grandmother           Medications Prior to Admission       Prescriptions    acetaminophen (TYLENOL) 500 mg Oral Tablet    Take 1 Tablet (500 mg total) by mouth Every night as needed Takes 2 at night    albuterol sulfate (PROVENTIL OR VENTOLIN OR PROAIR) 90 mcg/actuation Inhalation oral inhaler    Take 1-2 Puffs by inhalation Every 6 hours as needed    albuterol sulfate (PROVENTIL) 2.5 mg /3 mL (0.083 %) Inhalation nebulizer solution    Take 3 mL (2.5 mg total) by nebulization Every 4 hours as needed for Wheezing    amLODIPine (NORVASC) 2.5 mg Oral Tablet    Take 1 Tablet (2.5 mg total) by mouth Once a day    aspirin (ECOTRIN) 81 mg Oral Tablet, Delayed Release (E.C.)    Take 1 Tablet (81 mg total) by mouth Once a day    calcium carbonate/vitamin D3 (VITAMIN D-3 ORAL)    Take 2,000 Int'l Units/day by mouth Once a  day    ezetimibe (ZETIA) 10 mg Oral Tablet    Take 1 Tablet (10 mg total) by mouth Every evening    FLUoxetine (PROZAC) 20 mg Oral Capsule    TAKE 1 CAPSULE BY MOUTH EVERY DAY    Irbesartan (AVAPRO) 75 mg Oral Tablet    TAKE 1 TABLET BY MOUTH EVERY DAY (STOP LOSARTAN/HCTZ)    metoprolol succinate (TOPROL-XL) 25 mg Oral Tablet Sustained Release 24 hr    TAKE 1 TABLET BY MOUTH EVERY DAY AT NIGHT    omeprazole (PRILOSEC) 20 mg Oral Capsule, Delayed Release(E.C.)    Take 1 Capsule (20 mg total) by mouth Once a day    tiZANidine (ZANAFLEX) 4 mg Oral Tablet    TAKE 1 TABLET THREE TIMES A DAY AS NEEDED FOR MUSCLE CRAMPS Strength: 4 mg    TRULICITY 0.94 BS/9.6 mL Subcutaneous Pen Injector    INJECT 0.5 ML (0.75 MG) UNDER  THE SKIN EVERY 7 DAYS    Patient taking differently:  sundays           Allergies   Allergen Reactions    Amoxicillin     Augmentin [Amoxicillin-Pot Clavulanate] Nausea/ Vomiting    Statins-Hmg-Coa Reductase Inhibitors Myalgia          Review of Systems   Constitutional:  Positive for malaise/fatigue. Negative for chills and fever.        Review of systems as below.  Additional systems reviewed in HPI.     HENT:  Negative for congestion, sinus pain, sore throat and tinnitus.    Eyes:  Negative for blurred vision, photophobia, pain and redness.   Respiratory:  Positive for cough and shortness of breath. Negative for hemoptysis and wheezing.    Cardiovascular:  Negative for chest pain, palpitations, orthopnea, leg swelling and PND.   Gastrointestinal:  Positive for diarrhea. Negative for abdominal pain, blood in stool, heartburn, nausea and vomiting.   Genitourinary:  Negative for dysuria, frequency, hematuria and  urgency.   Musculoskeletal:  Positive for back pain and neck pain. Negative for joint pain and myalgias.   Skin:  Negative for rash.   Neurological:  Negative for dizziness, sensory change, speech change, focal weakness, weakness and headaches.   Endo/Heme/Allergies:  Negative for environmental  allergies. Does not bruise/bleed easily.   Psychiatric/Behavioral:  Negative for depression, hallucinations, memory loss, substance abuse and suicidal ideas.           Filed Vitals:    10/30/22 0803 10/30/22 0932 10/30/22 1100 10/30/22 1300   BP: 130/68   (!) 83/51   Pulse: 62   61   Resp: _0 Temp: 36.3 C (97.3 F)   36.2 C (97.1 F)   SpO2: 97%  94% 93%       Physical Exam  Constitutional:       Comments: Appears weak and lethargic   HENT:      Head: Normocephalic and atraumatic.      Right Ear: External ear normal.      Left Ear: External ear normal.      Nose: Nose normal.   Eyes:      Pupils: Pupils are equal, round, and reactive to light.   Cardiovascular:      Rate and Rhythm: Normal rate and regular rhythm.      Heart sounds: Normal heart sounds.   Pulmonary:      Breath sounds: Normal breath sounds.   Abdominal:      General: Bowel sounds are normal. There is no distension.      Palpations: Abdomen is soft.      Tenderness: There is no abdominal tenderness.   Musculoskeletal:         General: No tenderness or deformity. Normal range of motion.      Cervical back: Normal range of motion and neck supple.   Skin:     General: Skin is warm and dry.      Findings: No rash.   Neurological:      Mental Status: She is alert and oriented to person, place, and time.      Cranial Nerves: No cranial nerve deficit.      Deep Tendon Reflexes: Reflexes are normal and symmetric.   Psychiatric:         Mood and Affect: Affect normal.         Cognition and Memory: Memory normal.         Judgment: Judgment normal.            Laboratory Data:     Admission on 10/29/2022   Component Date Value    SODIUM 10/29/2022 134 (L)     POTASSIUM 10/29/2022 4.0     CHLORIDE 10/29/2022 96     CO2 TOTAL 10/29/2022 29     ANION GAP 10/29/2022 9     CALCIUM 10/29/2022 9.9     GLUCOSE 10/29/2022 148 (H)     BUN 10/29/2022 15     CREATININE 10/29/2022 1.28 (H)     BUN/CREA RATIO 10/29/2022 12     ESTIMATED GFR - FEMALE 10/29/2022 43  (L)     ALBUMIN 10/29/2022 3.4     ALKALINE PHOSPHATASE 10/29/2022 111     ALT (SGPT) 10/29/2022 16     AST (SGOT)  10/29/2022 17     BILIRUBIN TOTAL 10/29/2022 0.8     BILIRUBIN DIRECT 10/29/2022 0.3     PROTEIN TOTAL 10/29/2022 8.3 (H)  MAGNESIUM 10/29/2022 2.2     BNP 10/29/2022 98     PROTHROMBIN TIME 10/29/2022 13.5 (H)     INR 10/29/2022 1.19     APTT 10/29/2022 28.5     TROPONIN-I HS 10/29/2022 3.9     SARS-CoV-2 10/29/2022 Not Detected     INFLUENZA VIRUS TYPE A 10/29/2022 Not Detected     INFLUENZA VIRUS TYPE B 10/29/2022 Not Detected     RESPIRATORY SYNCTIAL VIR* 10/29/2022 Not Detected     Ventricular rate 10/29/2022 90     Atrial Rate 10/29/2022 90     PR Interval 10/29/2022 158     QRS Duration 10/29/2022 114     QT Interval 10/29/2022 378     QTC Calculation 10/29/2022 462     Calculated P Axis 10/29/2022 47     Calculated R Axis 10/29/2022 -43     Calculated T Axis 10/29/2022 90     WBC 10/29/2022 13.4 (H)     RBC 10/29/2022 4.20     HGB 10/29/2022 12.7     HCT 10/29/2022 38.5     MCV 10/29/2022 91.5     MCH 10/29/2022 30.1     MCHC 10/29/2022 32.9     RDW 10/29/2022 13.9     PLATELETS 10/29/2022 251     MPV 10/29/2022 9.7 (H)     NEUTROPHIL % 10/29/2022 83 (H)     LYMPHOCYTE % 10/29/2022 11 (L)     MONOCYTE % 10/29/2022 6     EOSINOPHIL % 10/29/2022 0     BASOPHIL % 10/29/2022 0     NEUTROPHIL # 10/29/2022 11.10 (H)     LYMPHOCYTE # 10/29/2022 1.40     MONOCYTE # 10/29/2022 0.80     EOSINOPHIL # 10/29/2022 0.10     BASOPHIL # 10/29/2022 0.00     Ventricular rate 10/29/2022 93     Atrial Rate 10/29/2022 93     PR Interval 10/29/2022 178     QRS Duration 10/29/2022 112     QT Interval 10/29/2022 378     QTC Calculation 10/29/2022 469     Calculated P Axis 10/29/2022 48     Calculated R Axis 10/29/2022 -40     Calculated T Axis 10/29/2022 92     TROPONIN-I HS 10/29/2022 5.1     TROPONIN-I HS 10/30/2022 4.5     WBC 10/30/2022 14.8 (H)     RBC 10/30/2022 3.76 (L)     HGB 10/30/2022 11.3 (L)     HCT  10/30/2022 34.5 (L)     MCV 10/30/2022 91.7     MCH 10/30/2022 29.9     MCHC 10/30/2022 32.7     RDW 10/30/2022 14.2     PLATELETS 10/30/2022 243     MPV 10/30/2022 9.0     SODIUM 10/30/2022 132 (L)     POTASSIUM 10/30/2022 4.0     CHLORIDE 10/30/2022 96     CO2 TOTAL 10/30/2022 27     ANION GAP 10/30/2022 9     CALCIUM 10/30/2022 9.4     GLUCOSE 10/30/2022 160 (H)     BUN 10/30/2022 16     CREATININE 10/30/2022 1.41 (H)     BUN/CREA RATIO 10/30/2022 11     ESTIMATED GFR - FEMALE 10/30/2022 38 (L)     TROPONIN-I HS 10/30/2022 5.3     RAINBOW/EXTRA TUBE AUTO * 10/30/2022 Yes     GLUCOSE, POC 10/30/2022 127 (H)     GLUCOSE, POC 10/30/2022 195 (H)  ERYTHROCYTE SEDIMENTATIO* 10/30/2022 41 (H)     CRP INFLAMMATION 10/30/2022 222.1 (H)     LACTIC ACID 10/30/2022 2.2         Imaging Studies:    CT ABDOMEN PELVIS WO IV CONTRAST   Final Result by Edi, Radresults In (11/08 1019)   No acute abnormalities identified of the abdomen or pelvis.                  The CT exam was performed using one or more the following a dose reduction techniques: Automated exposure control, adjustment of the mA and/or kV according to the patient's size, or use of iterative reconstruction technique.               Radiologist location ID: [id number]         CT ANGIO CHEST FOR PULMONARY EMBOLUS W IV CONTRAST   Final Result by Edi, Radresults In (11/07 2037)   No definite pulmonary embolus but limited by motion.                  The CT exam was performed using one or more the following a dose reduction techniques: Automated exposure control, adjustment of the mA and/or kV according to the patient's size, or use of iterative reconstruction technique.            Radiologist location ID: [id number]         XR AP MOBILE CHEST   Final Result by Edi, Radresults In (11/07 1733)      No acute cardiopulmonary process.                   Radiologist location ID: [id number]             Assessment/Plan:  Active Hospital Problems    Diagnosis    Primary  Problem: Hypoxia    Hypotension    Chronic diarrhea    History of cholecystectomy    Diabetes (CMS HCC)     Elevated CRP/ESR- rule out polymyalgia rheumatica vs temporal arteritis    Hypoxia- 2L NC, keep 02 saturation >90%. Respiratory panel ordered. Encourage use of IS.  Elevated CRP/ESR. Check lactic acid. Add prednisone 50 daily. Check cortisol level in the morning  Resume home medication list. Insulin sliding scale.  Respiratory panel, GI panel, echo pending. CT abdomen negative for any acute findings.  Hypotension- IV fluid bolus ordered. DC losartan and Norvasc. Continue metoprolol for now.   Consult PT  DVT prophylaxis, Lovenox 30 daily.     Estimated Length of Stay:  2-3 days    I spent a total of (45) minutes in direct/indirect care of this patient including initial evaluation, review of laboratory, radiology, diagnostic studies, review of medical record, order entry and coordination of care.     Mosie Epstein, NP    I personally saw and examined the patient.  See NP Carissa Bennett's note for additional details.  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 She states that the chest pain was actually located underneath her breasts with extension into her epigastric area and bilateral ribs. She describes it  as a sharp pain and states that it was not located in her chest. She was having some associated shortness of breath, as well. States all of this started happening about 3 days ago, although per chart review seems that she's had some issues with DOE at baseline. She doesn't wear O2 at home. She reports using her  inhalers for asthma as indicated. She denies any tobacco, EtOH or illicit drug use. She denies any peripheral edema that she has noted but remembers that she was hospitalized previously for fluid on the lungs.     She also notes weakness of her shoulders and hips with pain in her left temporal region. She denies visual changes.     In the ED, her labs were notable for leukocytosis, baseline CKD, normal troponin/BNP, lytes and hepatic function. Her respiratory panel was negative. Her CXR was without any evidence of infiltrates or obvious pulmonary edema. CTA chest was unremarkable.     Physical Examination:   General: stable appearing female in NAD; answered questions appropriately; A&Ox3   Cardiac: RRR; no m/r/g  Pulm: CTAB  GI: soft, NT ND  Extremities: no peripheral edema bilaterally     Labs and imaging personally reviewed.     Assessment/Plan:     Active Hospital Problems    Diagnosis    Primary Problem: Hypoxia    Hypotension    Chronic diarrhea    History of cholecystectomy    Diabetes (CMS HCC)     - admit to med/surg   - TTE to assess for cardiac origin of hypoxia   - doesn't appear to be infectious - negative respiratory panel, CXR without infiltrates  - patient has chronic diarrhea possibly 2/2 bile salt diarrhea in setting of cholecystectomy. Start cholestyramine if available while inpatient. Probably don't need to send off for GI panel. CT A/P unremarkable for acute process.   - ESR/CRP elevated, concern for PMR +/- GCA - start Prednisone 50 mg daily and monitor for improvement   - SSI +/- basal especially while on steroids   - hold anti-hypertensives   - will consider stopping antibiotics if patient remains stable, as no evidence of bacterial pneumonia on CXR   - she reports not taking Prednisone at home, although was listed on her PTA meds but wasn't sure. Will check an AM cortisol to r/o adrenal insufficiency given hypotension.   - physical therapy     I independently spent a total of 30 minutes in  direct/indirect care of this patient including initial evaluation, review of laboratory, radiology, diagnostic studies, review of medical record, order entry and coordination of care.      Allen Derry, DO  10/30/2022, 15:38  Internal Medicine, Hospitalist

## 2022-10-30 NOTE — Care Management Notes (Signed)
[facility]  Care Management Initial Evaluation    Patient Name: Joanna Black  Date of Birth: 01/18/1946  Sex: female  Date/Time of Admission: 10/29/2022  4:12 PM  Room/Bed: 107/A  Payor: [place] MEDICARE / Plan: [place] MEDICARE ADVANTAGE PPO / Product Type: PPO /   Primary Care Providers:  Genene Churn, DO, DO (General)    Pharmacy Info:   Preferred Pharmacy       [id number], [facility] - [address]    [address] [place] [address]    Phone: [phone] Fax: [phone]    Hours: Not open 24 hours    [facility] Delivery - [place], [place] - [address]    [address] [place] [address]    Phone: [phone] Fax: [phone]    Hours: Not open 24 hours          Emergency Contact Info:   Extended Emergency Contact Information  Primary Emergency Contact: Leach,[name]  Address: [address]           [place], [place] [address] [place] of [place] Phone: [phone]  Relation: Son  Secondary Emergency Contact: Valeda Malm  Address: [address]           [place], [place] [address] [place] of [place]  Home Phone: [phone]  Relation: Husband    History:   Joanna Black is a 76 y.o., female, admitted inpatient due to symptoms related to hypoxia       Height/Weight: 162.6 cm (5\' 4" ) / 92.2 kg (203 lb 4.8 oz)     LOS: 1 day   Admitting Diagnosis: Hypoxia [R09.02]    Assessment:     Provided pt with blank MPOA for consideration  Pt does not feel services are warranted in the home.  She is independent with ADLs.       10/30/22 0849   Assessment Details   Assessment Type Admission   Readmission   Is this a readmission? No   Insurance Information/Type   Insurance type Medicare   Medicare Intent to Discharge Documentation   Admit IMM given to: Patient   Admit IMM letter given date 10/29/22   Admit IMM letter time given 2057   IMM explained/reviewed with:  Patient;verbalized understanding   Employment/Financial   Patient has Prescription Coverage?   Yes        Name of Insurance Coverage for Medications [facility] Medicare   Financial Concerns none   Living Environment   Select an age group to open "lives with" row.  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  Yes   Discharge Needs Assessment   Equipment Currently Used at Home nebulizer;glucometer   Discharge Facility/Level of Care Needs Home (Patient/Family Member/other)(code 1)   Transportation Available family or friend will provide   Referral Information   Admission Type inpatient   Address Verified verified-no changes   Arrived From home or  self-care   ADVANCE DIRECTIVES   Does the Patient have an Advance Directive? No, Information Offered and Given   Patient Requests Assistance in Having Advance Directive Notarized. No, Will Do Independently   LAY CAREGIVER    Appointed Lay Caregiver? I Decline   Mutuality/Individual Preferences    Anxieties, Fears or Concerns stomach cramping - going to bathroom         Discharge Plan:  Home (Patient/Family Member/other) (code 1)      The patient will continue to be evaluated for developing discharge needs.     Case Manager: Excell Seltzer, SOCIAL WORKER

## 2022-10-30 NOTE — Nurses Notes (Signed)
Patient c/o frequent BM and nausea.  Patient to bathroom x6 after breakfast and states that this happens sometimes after she eats.  Patient reports increasing abdominal pain that is not normal for her.  Patient reports nausea, and PRN medication was administered per [facility].  DO and NP made aware of patient's symptoms. Nurse holding patient's medications at this time until nausea improves.  Darrin Luis, LPN

## 2022-10-31 ENCOUNTER — Inpatient Hospital Stay (HOSPITAL_COMMUNITY): Payer: Medicare PPO

## 2022-10-31 DIAGNOSIS — M315 Giant cell arteritis with polymyalgia rheumatica: Secondary | ICD-10-CM | POA: Diagnosis present

## 2022-10-31 DIAGNOSIS — R0600 Dyspnea, unspecified: Secondary | ICD-10-CM

## 2022-10-31 LAB — MANUAL DIFFERENTIAL
LYMPHOCYTE %: 6 % — ABNORMAL LOW (ref 20–35)
LYMPHOCYTE ABSOLUTE: 0.61 10*3/uL — ABNORMAL LOW (ref 1.00–3.80)
MONOCYTE %: 3 % (ref 0–13)
MONOCYTE ABSOLUTE: 0.31 10*3/uL (ref 0.00–1.40)
NEUTROPHIL %: 91 % — ABNORMAL HIGH (ref 50–70)
NEUTROPHIL ABSOLUTE: 9.28 10*3/uL — ABNORMAL HIGH (ref 2.40–7.60)
PLATELET ESTIMATE: ADEQUATE
RBC MORPHOLOGY COMMENT: NORMAL
WBC MORPHOLOGY COMMENT: NORMAL
WBC: 10.2 10*3/uL

## 2022-10-31 LAB — CBC WITH DIFF
HCT: 32.3 % — ABNORMAL LOW (ref 36.0–47.0)
HGB: 10.6 g/dL — ABNORMAL LOW (ref 12.0–15.0)
MCH: 30.2 pg (ref 27.0–32.0)
MCHC: 32.9 g/dL (ref 32.0–36.0)
MCV: 91.9 fL (ref 80.0–100.0)
MPV: 9.3 fL (ref 6.6–9.3)
PLATELETS: 232 10*3/uL (ref 140–450)
RBC: 3.52 10*6/uL — ABNORMAL LOW (ref 4.20–5.40)
RDW: 14.2 % (ref 12.0–15.0)
WBC: 10.2 10*3/uL (ref 4.8–10.8)

## 2022-10-31 LAB — BASIC METABOLIC PANEL
ANION GAP: 6 mmol/L (ref 4–13)
BUN/CREA RATIO: 19 (ref 6–22)
BUN: 27 mg/dL — ABNORMAL HIGH (ref 8–25)
CALCIUM: 9.1 mg/dL (ref 8.6–10.3)
CHLORIDE: 103 mmol/L (ref 96–111)
CO2 TOTAL: 23 mmol/L (ref 23–31)
CREATININE: 1.42 mg/dL — ABNORMAL HIGH (ref 0.60–1.05)
ESTIMATED GFR - FEMALE: 38 mL/min/BSA — ABNORMAL LOW (ref >=60)
GLUCOSE: 139 mg/dL — ABNORMAL HIGH (ref 65–125)
POTASSIUM: 4.8 mmol/L (ref 3.5–5.1)
SODIUM: 132 mmol/L — ABNORMAL LOW (ref 136–145)

## 2022-10-31 LAB — GI PANEL BY BIOFIRE FILM ARRAY
ADENOVIRUS F 40/41: NOT DETECTED
ASTROVIRUS: NOT DETECTED
CAMPYLOBACTER: NOT DETECTED
CLOSTRIDIUM DIFFICILE TOXIN A/B: NOT DETECTED
CRYPTOSPORIDIUM: NOT DETECTED
CYCLOSPORA CAYETANENSIS: NOT DETECTED
ENTAMOEBA HISTOLYTICA: NOT DETECTED
ENTEROAGGREGATIVE E. COLI (EAEC): NOT DETECTED
ENTEROPATHOGENIC E COLI (EPEC): DETECTED — AB
ENTEROTOXIGENIC E COLI (ETEC) LT/ST: NOT DETECTED
GIARDIA LAMBLIA: NOT DETECTED
NOROVIRUS GI/GII: NOT DETECTED
PLESIOMONAS SHIGELLOIDES: NOT DETECTED
ROTAVIRUS A: NOT DETECTED
SALMONELLA SPECIES: NOT DETECTED
SAPOVIRUS: NOT DETECTED
SHIGA-LIKE TOXIN-PRODUCING E COLI (STEC) STX1/STX2: NOT DETECTED
SHIGELLA/ENTEROINVASIVE E COLI (EIEC): NOT DETECTED
VIBRIO CHOLERAE: NOT DETECTED
VIBRIO: NOT DETECTED
YERSINIA ENTEROCOLITICA: NOT DETECTED

## 2022-10-31 LAB — COMPREHENSIVE METABOLIC PANEL, NON-FASTING
ALBUMIN: 2.5 g/dL — ABNORMAL LOW (ref 3.4–4.8)
ALKALINE PHOSPHATASE: 94 U/L (ref 55–145)
ALT (SGPT): 13 U/L (ref 8–22)
ANION GAP: 8 mmol/L (ref 4–13)
AST (SGOT): 14 U/L (ref 8–45)
BILIRUBIN TOTAL: 0.1 mg/dL — ABNORMAL LOW (ref 0.3–1.3)
BUN/CREA RATIO: 16 (ref 6–22)
BUN: 28 mg/dL — ABNORMAL HIGH (ref 8–25)
CALCIUM: 9.1 mg/dL (ref 8.6–10.3)
CHLORIDE: 103 mmol/L (ref 96–111)
CO2 TOTAL: 18 mmol/L — ABNORMAL LOW (ref 23–31)
CREATININE: 1.72 mg/dL — ABNORMAL HIGH (ref 0.60–1.05)
ESTIMATED GFR - FEMALE: 30 mL/min/BSA — ABNORMAL LOW (ref >=60)
GLUCOSE: 223 mg/dL — ABNORMAL HIGH (ref 65–125)
POTASSIUM: 4.9 mmol/L (ref 3.5–5.1)
PROTEIN TOTAL: 6.7 g/dL (ref 6.0–8.0)
SODIUM: 129 mmol/L — ABNORMAL LOW (ref 136–145)

## 2022-10-31 LAB — POC BLOOD GLUCOSE (RESULTS)
GLUCOSE, POC: 180 mg/dl — ABNORMAL HIGH (ref 60–110)
GLUCOSE, POC: 186 mg/dl — ABNORMAL HIGH (ref 60–110)
GLUCOSE, POC: 157 mg/dl — ABNORMAL HIGH (ref 60–110)

## 2022-10-31 MED ORDER — CHOLESTYRAMINE-ASPARTAME 4 GRAM ORAL POWDER FOR SUSP IN A PACKET
4.0000 g | Freq: Two times a day (BID) | ORAL | Status: DC
Start: 2022-10-31 — End: 2022-11-01
  Administered 2022-10-31 – 2022-11-01 (×3): 4 g via ORAL
  Filled 2022-10-31 (×3): qty 1

## 2022-10-31 MED ORDER — SODIUM CHLORIDE 0.9 % INTRAVENOUS SOLUTION
INTRAVENOUS | Status: AC
Start: 2022-10-31 — End: 2022-10-31

## 2022-10-31 NOTE — Care Plan (Signed)
Patient has been up to the bathroom with staff assistance. Patient is in bed with bed in lowest position, wheels locked, and side rails up x3. Medications given per MAR. Call light and fluids are within reach.     Problem: Adult Inpatient Plan of Care  Goal: Plan of Care Review  Outcome: Ongoing (see interventions/notes)  Goal: Patient-Specific Goal (Individualized)  Outcome: Ongoing (see interventions/notes)  Flowsheets (Taken 10/30/2022 2030)  Individualized Care Needs: meds, oxygen monitoring  Anxieties, Fears or Concerns: none voiced  Patient-Specific Goals (Include Timeframe): pain control, wants to go home  Plan of Care Reviewed With: patient  Goal: Absence of Hospital-Acquired Illness or Injury  Outcome: Ongoing (see interventions/notes)  Intervention: Identify and Manage Fall Risk  Recent Flowsheet Documentation  Taken 10/30/2022 2030 by Gerome Apley, RN  Safety Promotion/Fall Prevention:   activity supervised   fall prevention program maintained   nonskid shoes/slippers when out of bed  Intervention: Prevent Skin Injury  Recent Flowsheet Documentation  Taken 10/31/2022 0600 by Gerome Apley, RN  Body Position: supine, head elevated  Taken 10/31/2022 0358 by Gerome Apley, RN  Body Position: supine, head elevated  Taken 10/31/2022 0146 by Gerome Apley, RN  Body Position: side lying, left  Taken 10/30/2022 2347 by Gerome Apley, RN  Body Position: supine, head elevated  Taken 10/30/2022 2246 by Gerome Apley, RN  Skin Protection: adhesive use limited  Taken 10/30/2022 2155 by Gerome Apley, RN  Body Position: supine, head elevated  Taken 10/30/2022 2030 by Gerome Apley, RN  Body Position: supine, head elevated  Skin Protection: adhesive use limited  Intervention: Prevent and Manage VTE (Venous Thromboembolism) Risk  Recent Flowsheet Documentation  Taken 10/30/2022 2030 by Gerome Apley, RN  VTE Prevention/Management: ambulation promoted  Goal: Optimal Comfort and Wellbeing  Outcome: Ongoing (see  interventions/notes)  Intervention: Provide Person-Centered Care  Recent Flowsheet Documentation  Taken 10/30/2022 2030 by Gerome Apley, RN  Trust Relationship/Rapport:   care explained   thoughts/feelings acknowledged   questions answered  Goal: Rounds/Family Conference  Outcome: Ongoing (see interventions/notes)     Problem: Health Knowledge, Opportunity to Enhance (Adult,Obstetrics,Pediatric)  Goal: Knowledgeable about Health Subject/Topic  Description: Patient will demonstrate the desired outcomes by discharge/transition of care.  Outcome: Ongoing (see interventions/notes)  Intervention: Enhance Health Knowledge  Recent Flowsheet Documentation  Taken 10/30/2022 2030 by Gerome Apley, RN  Family/Support System Care: support provided  Supportive Measures: active listening utilized  Intervention: Enhance Health Knowledge  Recent Flowsheet Documentation  Taken 10/30/2022 2030 by Gerome Apley, RN  Family/Support System Care: support provided  Supportive Measures: active listening utilized     Problem: Fall Injury Risk  Goal: Absence of Fall and Fall-Related Injury  Outcome: Ongoing (see interventions/notes)  Intervention: Identify and Manage Contributors  Recent Flowsheet Documentation  Taken 10/30/2022 2030 by Gerome Apley, RN  Self-Care Promotion: independence encouraged  Intervention: Promote Injury-Free Environment  Recent Flowsheet Documentation  Taken 10/30/2022 2030 by Gerome Apley, RN  Safety Promotion/Fall Prevention:   activity supervised   fall prevention program maintained   nonskid shoes/slippers when out of bed     Problem: Gas Exchange Impaired  Goal: Optimal Gas Exchange  Outcome: Ongoing (see interventions/notes)  Intervention: Optimize Oxygenation and Ventilation  Recent Flowsheet Documentation  Taken 10/30/2022 2030 by Gerome Apley, RN  Head of Bed [facility]) Positioning: HOB at 30-45 degrees     Problem: Pain Acute  Goal: Optimal Pain Control and Function  Outcome: Ongoing (see  interventions/notes)  Intervention: Prevent or Manage Pain  Recent  Flowsheet Documentation  Taken 10/30/2022 2030 by Gerome Apley, RN  Sensory Stimulation Regulation: care clustered  Sleep/Rest Enhancement: regular sleep/rest pattern promoted  Bowel Elimination Promotion: ambulation promoted  Intervention: Optimize Psychosocial Wellbeing  Recent Flowsheet Documentation  Taken 10/30/2022 2030 by Gerome Apley, RN  Diversional Activities:   television   smartphone  Supportive Measures: active listening utilized

## 2022-10-31 NOTE — Care Plan (Signed)
[facility]  Physical Therapy     Patient Name: Joanna Black  Date of Birth: [birth year]  Room/Bed: 107/A      Subjective:   Patient willing to work with PT.  Pleasant and cooperative.    Objective :  See flow sheet.       10/31/22 1600   Rehab Session   Document Type therapy progress note (daily note)   Total PT Minutes: 15   Patient Effort excellent   Patient Effort, Rehab Treatment Comment excellent   Symptoms Noted During/After Treatment none   Bed Mobility Assessment/Treatment   Bed Mobility, Assistive Device bed rails   Supine-Sit Independence independent   Sit to Supine, Independence independent   Transfer Assessment/Treatment   Sit-Stand Independence stand-by assistance   Stand-Sit Independence supervision required   Sit-Stand-Sit, Assist Device walker, front wheeled   Gait Assessment/Treatment   Total Distance Ambulated 100   Independence  set up required   Assistive Device  walker, front wheeled   Distance in Feet 100   Gait Speed normal   Balance Skill Training   Sitting Balance: Static normal balance   Sitting, Dynamic (Balance) normal balance   Sit-to-Stand Balance normal balance   Standing Balance: Static normal balance   Standing Balance: Dynamic normal balance   Therapeutic Exercise/Activity   Comment gait   Physical Therapy Clinical Impression   Assessment Patient did very well with transfers and ambulation.         Assessment:  See assessment in flow sheet.    Plan:  Continue while inpatient.    Robbi Garter, PT

## 2022-10-31 NOTE — Progress Notes (Signed)
[facility]  Hospitalist  Progress Note    Date of Service:  85/07/8501  Joanna, Black, 76 y.o. female  Date of Admission:  10/29/2022  Date of Birth:  03-16-1946  PCP: Samella Parr, DO    HPI:  Patient resting in bed.  She states she is feeling much better than when she arrived at the hospital.  Shortness of breath is improving.  Patient denies nausea or vomiting.  Still attempting to obtain a stool sample.  Patient is hoping to have her echocardiogram completed today.  Kidney function slightly worse today with a BUN of 28 and creatinine 1.72.  Patient does have a history of stage III chronic kidney disease.  Blood pressure much improved after holding antihypertensives.    acetaminophen (TYLENOL) tablet, 500 mg, Oral, HS PRN  aspirin (ECOTRIN) enteric coated tablet 81 mg, 81 mg, Oral, Daily  cholecalciferol (VITAMIN D3) 1000 unit (25 mcg) tablet, 2,000 Units, Oral, Daily  cholestyramine-aspartame (PREVALITE) packet, 4 g, Oral, 2x/day-Food  enoxaparin (LOVENOX) 30 mg/0.3 mL SubQ injection, 30 mg, Subcutaneous, Daily  ezetimibe (ZETIA) tablet, 10 mg, Oral, QPM  FLUoxetine (PROzac) capsule, 20 mg, Oral, Daily  ipratropium-albuterol 0.5 mg-3 mg(2.5 mg base)/3 mL Solution for Nebulization, 3 mL, Nebulization, 4x/day  metoprolol succinate (TOPROL-XL) 24 hr extended release tablet, 25 mg, Oral, NIGHTLY  NS bolus infusion 100 mL, 100 mL, Intravenous, Give in Radiology  NS premix infusion, , Intravenous, Continuous  ondansetron (ZOFRAN) 2 mg/mL injection, 4 mg, Intravenous, Q8H PRN  pantoprazole (PROTONIX) delayed release tablet, 40 mg, Oral, Daily  predniSONE (DELTASONE) tablet 50 mg, 50 mg, Oral, Daily with Breakfast  SSIP insulin lispro (HumaLOG) 100 units/mL injection, 0-12 Units, Subcutaneous, 4x/day PRN  tiZANidine (ZANAFLEX) tablet, 4 mg, Oral, 3x/day         Allergies   Allergen Reactions    Amoxicillin     Augmentin [Amoxicillin-Pot Clavulanate] Nausea/ Vomiting    Statins-Hmg-Coa Reductase Inhibitors  Myalgia             Filed Vitals:    10/31/22 0227 10/31/22 0432 10/31/22 0800 10/31/22 0851   BP: 120/67 130/67  124/67   Pulse: 63 64  68   Resp: _0 Temp: 36.4 C (97.6 F) 36.2 C (97.1 F)  36.4 C (97.6 F)   SpO2: 92% 93% 97% 96%       Physical Exam  Vitals and nursing note reviewed.   Constitutional:       General: She is not in acute distress.     Appearance: Normal appearance. She is obese. She is not diaphoretic.   HENT:      Head: Normocephalic and atraumatic.      Right Ear: External ear normal.      Left Ear: External ear normal.      Nose: Nose normal.      Mouth/Throat:      Mouth: Mucous membranes are moist.   Eyes:      General: No scleral icterus.     Conjunctiva/sclera: Conjunctivae normal.      Pupils: Pupils are equal, round, and reactive to light.   Cardiovascular:      Rate and Rhythm: Normal rate and regular rhythm.      Heart sounds: Normal heart sounds. No murmur heard.     No friction rub. No gallop.   Pulmonary:      Effort: Pulmonary effort is normal. No respiratory distress.      Breath sounds: Normal breath  sounds. No wheezing or rales.   Chest:      Chest wall: No tenderness.   Abdominal:      General: Bowel sounds are normal. There is no distension.      Palpations: Abdomen is soft. There is no mass.      Tenderness: There is no abdominal tenderness. There is no guarding or rebound.   Musculoskeletal:         General: Normal range of motion.      Cervical back: Normal range of motion and neck supple.   Skin:     General: Skin is warm and dry.      Findings: No erythema or rash.   Neurological:      Mental Status: She is alert and oriented to person, place, and time.      Cranial Nerves: No cranial nerve deficit.   Psychiatric:         Mood and Affect: Mood and affect normal.         Behavior: Behavior normal.         Thought Content: Thought content normal.          Laboratory Data:     Results for orders placed or performed during the hospital encounter of 10/29/22 (from  the past 24 hour(s))   LACTIC ACID LEVEL W/ REFLEX FOR LEVEL >2.0   Result Value Ref Range    LACTIC ACID 2.2 0.5 - 2.2 mmol/L   RESPIRATORY VIRUS PANEL    Specimen: Nasopharyngeal Swab   Result Value Ref Range    ADENOVIRUS ARRAY Not Detected Not Detected    CORONAVIRUS 229E Not Detected Not Detected    CORONAVIRUS HKU1 Not Detected Not Detected    CORONAVIRUS NL63 Not Detected Not Detected    CORONAVIRUS OC43 Not Detected Not Detected    SARS CORONAVIRUS 2 (SARS-CoV-2) Not Detected Not Detected    METAPNEUMOVIRUS ARRAY Not Detected Not Detected    RHINOVIRUS/ENTEROVIRUS ARRAY Not Detected Not Detected    INFLUENZA A Not Detected Not Detected    INFLUENZA B ARRAY Not Detected Not Detected    PARAINFLUENZA 1 ARRAY Not Detected Not Detected    PARAINFLUENZA 2 ARRAY Not Detected Not Detected    PARAINFLUENZA 3 ARRAY Not Detected Not Detected    PARAINFLUENZA 4 ARRAY Not Detected Not Detected    RSV ARRAY Not Detected Not Detected    BORDETELLA PARAPERTUSSIS (IS 1001) Not Detected Not Detected    BORDETELLA PERTUSSIS ARRAY Not Detected Not Detected    CHLAMYDOPHILA PNEUMONIAE ARRAY Not Detected Not Detected    MYCOPLASMA PNEUMONIAE ARRAY Not Detected Not Detected   POC BLOOD GLUCOSE (RESULTS)   Result Value Ref Range    GLUCOSE, POC 199 (H) 60 - 110 mg/dl   LACTIC ACID - FIRST REFLEX   Result Value Ref Range    LACTIC ACID 2.3 (H) 0.5 - 2.2 mmol/L   POC BLOOD GLUCOSE (RESULTS)   Result Value Ref Range    GLUCOSE, POC 231 (H) 60 - 110 mg/dl   LACTIC ACID - SECOND REFLEX   Result Value Ref Range    LACTIC ACID 2.6 (H) 0.5 - 2.2 mmol/L   POC BLOOD GLUCOSE (RESULTS)   Result Value Ref Range    GLUCOSE, POC 240 (H) 60 - 110 mg/dl   COMPREHENSIVE METABOLIC PANEL, NON-FASTING   Result Value Ref Range    SODIUM 129 (L) 136 - 145 mmol/L    POTASSIUM 4.9 3.5 - 5.1 mmol/L  CHLORIDE 103 96 - 111 mmol/L    CO2 TOTAL 18 (L) 23 - 31 mmol/L    ANION GAP 8 4 - 13 mmol/L    BUN 28 (H) 8 - 25 mg/dL    CREATININE 1.72 (H) 0.60 - 1.05  mg/dL    BUN/CREA RATIO 16 6 - 22    ALBUMIN 2.5 (L) 3.4 - 4.8 g/dL     CALCIUM 9.1 8.6 - 10.3 mg/dL    GLUCOSE 223 (H) 65 - 125 mg/dL    ALKALINE PHOSPHATASE 94 55 - 145 U/L    ALT (SGPT) 13 8 - 22 U/L    AST (SGOT)  14 8 - 45 U/L    BILIRUBIN TOTAL 0.1 (L) 0.3 - 1.3 mg/dL    PROTEIN TOTAL 6.7 6.0 - 8.0 g/dL    ESTIMATED GFR - FEMALE 30 (L) >=60 mL/min/BSA   CBC WITH DIFF   Result Value Ref Range    WBC 10.2 4.8 - 10.8 x10^3/uL    RBC 3.52 (L) 4.20 - 5.40 x10^6/uL    HGB 10.6 (L) 12.0 - 15.0 g/dL    HCT 32.3 (L) 36.0 - 47.0 %    MCV 91.9 80.0 - 100.0 fL    MCH 30.2 27.0 - 32.0 pg    MCHC 32.9 32.0 - 36.0 g/dL    RDW 14.2 12.0 - 15.0 %    PLATELETS 232 140 - 450 x10^3/uL    MPV 9.3 6.6 - 9.3 fL   MANUAL DIFFERENTIAL   Result Value Ref Range    NEUTROPHIL % 91 (H) 50 - 70 %    LYMPHOCYTE % 6 (L) 20 - 35 %    MONOCYTE % 3 0 - 13 %    NEUTROPHIL ABSOLUTE 9.28 (H) 2.40 - 7.60 x10^3/uL    LYMPHOCYTE ABSOLUTE 0.61 (L) 1.00 - 3.80 x10^3/uL    MONOCYTE ABSOLUTE 0.31 0.00 - 1.40 x10^3/uL    PLATELET ESTIMATE Adequate     RBC MORPHOLOGY COMMENT Normal     WBC MORPHOLOGY COMMENT Normal     WBC 10.2 x10^3/uL   POC BLOOD GLUCOSE (RESULTS)   Result Value Ref Range    GLUCOSE, POC 180 (H) 60 - 110 mg/dl       Imaging Studies:    CT ABDOMEN PELVIS WO IV CONTRAST   Final Result by Edi, Radresults In (11/08 1019)   No acute abnormalities identified of the abdomen or pelvis.                  The CT exam was performed using one or more the following a dose reduction techniques: Automated exposure control, adjustment of the mA and/or kV according to the patient's size, or use of iterative reconstruction technique.               Radiologist location ID: [id number]         CT ANGIO CHEST FOR PULMONARY EMBOLUS W IV CONTRAST   Final Result by Edi, Radresults In (11/07 2037)   No definite pulmonary embolus but limited by motion.                  The CT exam was performed using one or more the following a dose reduction techniques:  Automated exposure control, adjustment of the mA and/or kV according to the patient's size, or use of iterative reconstruction technique.            Radiologist location ID: [id number]  XR AP MOBILE CHEST   Final Result by Edi, Radresults In (11/07 1733)      No acute cardiopulmonary process.                   Radiologist location ID: [id number]             Assessment/Plan:  Hospital Problems    1Giant cell arteritis with polymyalgia rheumatica (CMS [place])         Date Noted: 10/31/2022      Hypoxia         Date Noted: 10/29/2022      Chronic diarrhea         Date Noted: 04/21/2022      History of cholecystectomy         Date Noted: 04/21/2022      Type 2 diabetes mellitus with stage 3a chronic kidney disease, without long-term current use of insulin (CMS [facility])      Resolved Hospital Problems    Hypotension         Date Noted: 10/30/2022         Date Resolved: 10/31/2022        1. Hypoxia.  Continue respiratory therapy.  Titrate O2 as tolerated.  Obtain TTE today for further cardiac evaluation.  Patient is currently on room air.  2. Acute on chronic renal failure.  Patient has stage III renal failure at baseline.  Add IV fluids and closely monitor renal function.  Review current meds and discontinue or renally dose medications as needed.  Repeat labs in the morning.    3. Chronic diarrhea.  Obtain stool sample for further laboratory testing.  Start culture all b.i.d..  4. Diabetes.  Accu-Cheks q.a.c. and HS with house sliding scale insulin.  5. DVT prophylaxis.  Lovenox subQ daily.    Cortisol labs are still pending to rule out adrenal insufficiency.       I spent a total of (20) minutes in direct/indirect care of this patient including initial evaluation, review of laboratory, radiology, diagnostic studies, review of medical record, order entry and coordination of care.     [place] Crews, PA-C    I personally saw and examined the patient.  See PA Phineas Douglas note for additional details.  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 The patient reported yesterday significant weakness/pain/fatigue in her bilateral shoulders and hips along with pain in her temporal region. 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 Started on Prednisone 50 mg daily which she has responded significantly to. TTE is pending. Antibiotics have been stopped as she does not have infectious etiology of her symptoms. Diarrhea is chronic, will restart Cholestyramine.     Physical Examination:   General: stable appearing female in NAD; answered questions appropriately; A&Ox3   Cardiac: RRR; no m/r/g  Pulm: CTAB  GI: soft, NT ND  Extremities: no edema bilaterally     Labs and imaging personally reviewed.     Assessment/Plan:     Active Hospital Problems    Diagnosis    Primary Problem: 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)    Hypoxia  Chronic diarrhea    History of cholecystectomy    Type 2 diabetes mellitus with stage 3a chronic kidney disease, without long-term current use of insulin (CMS [facility])     - c/w Prednisone 40 mg daily with long taper as an outpatient   - probably doesn't need temporal artery biopsy since responded well to high dose steroids   - repeat BMP this afternoon   - light fluids for renal function   - TTE results pending   - hold BP meds given hypotension, although improved over last 12 hours     Anticipate discharge tomorrow.     I independently spent a total of 15 minutes in direct/indirect care of this patient including initial evaluation, review of laboratory, radiology, diagnostic studies, review of medical  record, order entry and coordination of care.      Allen Derry, DO  10/31/2022, 11:50  Internal Medicine, Hospitalist

## 2022-11-01 ENCOUNTER — Encounter (HOSPITAL_BASED_OUTPATIENT_CLINIC_OR_DEPARTMENT_OTHER): Payer: Self-pay | Admitting: Student in an Organized Health Care Education/Training Program

## 2022-11-01 ENCOUNTER — Ambulatory Visit (HOSPITAL_BASED_OUTPATIENT_CLINIC_OR_DEPARTMENT_OTHER): Payer: Self-pay | Admitting: Student in an Organized Health Care Education/Training Program

## 2022-11-01 ENCOUNTER — Inpatient Hospital Stay (HOSPITAL_COMMUNITY): Payer: Medicare PPO

## 2022-11-01 DIAGNOSIS — A040 Enteropathogenic Escherichia coli infection: Secondary | ICD-10-CM | POA: Insufficient documentation

## 2022-11-01 LAB — ECG 12-LEAD
Atrial Rate: 61 {beats}/min
Calculated P Axis: 53 degrees
Calculated R Axis: -39 degrees
Calculated T Axis: -47 degrees
PR Interval: 200 ms
QRS Duration: 122 ms
QT Interval: 468 ms
QTC Calculation: 471 ms
Ventricular rate: 61 {beats}/min

## 2022-11-01 LAB — COMPREHENSIVE METABOLIC PANEL, NON-FASTING
ALBUMIN: 2.5 g/dL — ABNORMAL LOW (ref 3.4–4.8)
ALKALINE PHOSPHATASE: 79 U/L (ref 55–145)
ALT (SGPT): 11 U/L (ref 8–22)
ANION GAP: 6 mmol/L (ref 4–13)
AST (SGOT): 9 U/L (ref 8–45)
BILIRUBIN TOTAL: 0.2 mg/dL — ABNORMAL LOW (ref 0.3–1.3)
BUN/CREA RATIO: 20 (ref 6–22)
BUN: 25 mg/dL (ref 8–25)
CALCIUM: 9.1 mg/dL (ref 8.6–10.3)
CHLORIDE: 106 mmol/L (ref 96–111)
CO2 TOTAL: 23 mmol/L (ref 23–31)
CREATININE: 1.24 mg/dL — ABNORMAL HIGH (ref 0.60–1.05)
ESTIMATED GFR - FEMALE: 45 mL/min/BSA — ABNORMAL LOW (ref >=60)
GLUCOSE: 140 mg/dL — ABNORMAL HIGH (ref 65–125)
POTASSIUM: 4.5 mmol/L (ref 3.5–5.1)
PROTEIN TOTAL: 6.1 g/dL (ref 6.0–8.0)
SODIUM: 135 mmol/L — ABNORMAL LOW (ref 136–145)

## 2022-11-01 LAB — CBC WITH DIFF
BASOPHIL #: 0.1 10*3/uL (ref 0.00–0.20)
BASOPHIL %: 1 % (ref 0–2)
EOSINOPHIL #: 0 10*3/uL (ref 0.00–4.00)
EOSINOPHIL %: 0 % (ref 0–4)
HCT: 30 % — ABNORMAL LOW (ref 36.0–47.0)
HGB: 10 g/dL — ABNORMAL LOW (ref 12.0–15.0)
LYMPHOCYTE #: 1.8 10*3/uL (ref 1.00–3.80)
LYMPHOCYTE %: 13 % — ABNORMAL LOW (ref 20–35)
MCH: 30.2 pg (ref 27.0–32.0)
MCHC: 33.2 g/dL (ref 32.0–36.0)
MCV: 91 fL (ref 80.0–100.0)
MONOCYTE #: 0.8 10*3/uL (ref 0.00–1.40)
MONOCYTE %: 6 % (ref 3–13)
MPV: 8.9 fL (ref 6.6–9.3)
NEUTROPHIL #: 11.2 10*3/uL — ABNORMAL HIGH (ref 2.40–7.60)
NEUTROPHIL %: 81 % — ABNORMAL HIGH (ref 50–70)
PLATELETS: 249 10*3/uL (ref 140–450)
RBC: 3.3 10*6/uL — ABNORMAL LOW (ref 4.20–5.40)
RDW: 14.1 % (ref 12.0–15.0)
WBC: 13.9 10*3/uL — ABNORMAL HIGH (ref 4.8–10.8)

## 2022-11-01 LAB — CORTISOL, PLASMA OR SERUM: CORTISOL: 8.4 ug/dL (ref 7.0–25.0)

## 2022-11-01 MED ORDER — CHOLESTYRAMINE-ASPARTAME 4 GRAM ORAL POWDER FOR SUSP IN A PACKET
4.0000 g | Freq: Every evening | ORAL | 0 refills | Status: DC
Start: 2022-11-01 — End: 2022-12-10

## 2022-11-01 MED ORDER — PREDNISONE 50 MG TABLET
50.0000 mg | ORAL_TABLET | Freq: Every morning | ORAL | 0 refills | Status: DC
Start: 2022-11-02 — End: 2022-11-22

## 2022-11-01 NOTE — Nurses Notes (Signed)
Patient discharged home with family.  AVS reviewed with patient/care giver.  A written copy of the AVS and discharge instructions was given to the patient/care giver.  Questions sufficiently answered as needed.  Patient/care giver encouraged to follow up with PCP as indicated.  In the event of an emergency, patient/care giver instructed to call 911 or go to the nearest emergency room. Patient stable, no s/s of pain or discomfort, patient VIA wheelchair with belong transport by CNA accompanied by son "Jenny Reichmann" and  husband to private vehicle.

## 2022-11-01 NOTE — Care Plan (Signed)
BP (!) 144/70   Pulse 59   Temp 36.3 C (97.3 F)   Resp 18   Ht 1.626 m (5\' 4" )   Wt 98.2 kg (216 lb 9.6 oz)   SpO2 96%   BMI 37.18 kg/m     Problem: Adult Inpatient Plan of Care  Goal: Absence of Hospital-Acquired Illness or Injury  Intervention: Identify and Manage Fall Risk  Recent Flowsheet Documentation  Taken 10/31/2022 2047 by 2048, LPN  Safety Promotion/Fall Prevention:   nonskid shoes/slippers when out of bed   activity supervised     Problem: Adult Inpatient Plan of Care  Goal: Absence of Hospital-Acquired Illness or Injury  Intervention: Prevent Infection  Recent Flowsheet Documentation  Taken 10/31/2022 2047 by 2048, LPN  Infection Prevention: single patient room provided     Problem: Fall Injury Risk  Goal: Absence of Fall and Fall-Related Injury  Outcome: Ongoing (see interventions/notes)  Intervention: Promote Injury-Free Environment  Recent Flowsheet Documentation  Taken 10/31/2022 2047 by 2048, LPN  Safety Promotion/Fall Prevention:   nonskid shoes/slippers when out of bed   activity supervised

## 2022-11-01 NOTE — Ancillary Notes (Signed)
RD note for inpatient. Dx: Giant cell arteritis with polymyalgia rheumatica; Enteropathogenic E. Coli infection; Chronic diarrhea; Type 2 Diabetes Mellitus with stage 3A Chronic kidney disease. PMH: Gastroesophageal reflux disease; Dysphagia; Esophageal stricture; Diverticulosis of colon; CKD (Chronic kidney disease Stage 3); Mixed hyperlipidemia; Essential hypertension; Cardiomyopathy, dilated, nonischemic; Chronic systolic congestive heart failure; Chronic GERD; S/P Cholecystectomy. Relevant meds: Cholestyramine, Zetia, Lovenox, Prednisone, Vitamin D3, Aspirin 81 mg, Prozac, Protonix, Zofran, Toprol XL, Zanaflex, Ipratopium-Albuterol, Tylenol, Insulin Lispro. Receives IV fluids at 75 mL/hr which provides 1800 mL fluid per day. Labs from 11/01/2022: WBC 13.9(H); H&H: 10.0(L) & 36.0(L); Sodium 135(L); Glucose 140(H); BUN 25; Creatinine 1.24(H); Estimated GFR 45(L); Albumin 2.5(L); Total Protein 6.1(WNL). Glucose POC 180, 186, and 157 on 10/31/2022. Patient has 1+ (Trace) edema to bilateral ankles per Nurses' Note 10/31/2022. No pressure ulcer noted. Ht = 5'4". Wt 216#, 9.6 oz on 11/01/2022, Wt on admit 10/29/2022, was 210# (+6.6#, +3.1%). Adjusted body weight = 159#. Estimated nutritional needs at adjusted body weight of 159# = 1657 kcal, 58-72 grams protein, and 1807 mL fluid per day. I&O past 2 days is noted. On 10/30/2022: 1770/700 + urine x 2 and stool x 4. On 10/31/2022: 1780/urine x 7 and stool x 4. On Diabetic diet (Carb Control, No Concentrated Sweets, Carb Consistent 60-75 gm/meal. Intakes since admit--Meals: No meals refused, consumed 0@25 %, 1@50 %, 4@75 %, 2@100 %. Meal intake is good. No changes to nutrition therapy are recommended at this time. Will monitor for changes via facility staff. Joanna Black. Joanna Lauderback,MS,RDN,LD,EdD

## 2022-11-01 NOTE — Discharge Summary (Signed)
[facility]  DISCHARGE SUMMARY    PATIENT NAME:  Joanna Black, Joanna Black  MRN:  [id number]  DOB:  10-02-1946    ENCOUNTER DATE:  10/29/2022  INPATIENT ADMISSION DATE: 10/29/2022  DISCHARGE DATE:  11/01/2022    ATTENDING PHYSICIAN: Allen Derry, DO  SERVICE: [facility] MED/SURG  PRIMARY CARE PHYSICIAN: Samella Parr, DO       [place] Caregiver Name: Aeon Koors Caregiver Contact Number: [phone]   [place] Caregiver Relationship to patient: spouse/significant other    PRIMARY DISCHARGE DIAGNOSIS: Giant cell arteritis with polymyalgia rheumatica (CMS [facility])  Active Hospital Problems    Diagnosis Date Noted    Principal Problem: 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) [M31.5] 10/31/2022    Enteropathogenic Escherichia coli infection [A04.0] 11/01/2022    Chronic diarrhea [K52.9] 04/21/2022    Type 2 diabetes mellitus with stage 3a chronic kidney disease, without long-term current use of insulin (CMS HCC) [E11.22, N18.31]       Resolved Hospital Problems    Diagnosis     Hypotension [I95.9]     Hypoxia [R09.02]     History of cholecystectomy [Z90.49]      Active Non-Hospital Problems    Diagnosis Date Noted    History of colon polyps 04/21/2022    Colon cancer screening 04/21/2022    Gastroesophageal reflux disease 04/21/2022    Dysphagia 04/21/2022    Esophageal stricture 04/21/2022    Diverticulosis of colon 04/21/2022    CKD (chronic kidney disease) stage 3, 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/min (CMS HCC) 11/29/2021    Mixed hyperlipidemia 04/27/2020    Episode of recurrent major depressive disorder (CMS [place]) 04/27/2020    BMI 35.0-35.9,adult 02/16/2020    Essential hypertension 01/28/2019    Cervical pain (neck) 07/28/2018    Thoracic back pain 07/28/2018    Chronic bilateral low back pain 07/28/2018    Cardiomyopathy, dilated, nonischemic (CMS HCC) 03/21/2016    Chronic systolic congestive heart failure (CMS HCC) 03/21/2016    Chronic GERD 02/20/2016           Current Discharge Medication List        START taking these  medications.        Details   cholestyramine-aspartame 4 gram Powder in Packet  Commonly known as: PREVALITE   Take 1 Packet (4 g total) by mouth Every evening with dinner for 30 days  Qty: 30 Each  Refills: 0     predniSONE 50 mg Tablet  Commonly known as: DELTASONE  Start taking on: November 02, 2022   Take 1 Tablet (50 mg total) by mouth Every morning with breakfast for 14 days  Qty: 14 Tablet  Refills: 0            CONTINUE these medications which have CHANGED during your visit.        Details   Trulicity 1.88 CZ/6.6 mL Pen Injector  Generic drug: dulaglutide  What changed: See the new instructions.   INJECT 0.5 ML (0.75 MG) UNDER  THE SKIN EVERY 7 DAYS  Qty: 6 mL  Refills: 3            CONTINUE these medications - NO CHANGES were made during your visit.        Details   acetaminophen 500 mg Tablet  Commonly known as: TYLENOL   Take 1 Tablet (500 mg total) by mouth Every night as needed Takes 2 at night  Refills: 0     * albuterol sulfate 90 mcg/actuation oral  inhaler  Commonly known as: PROVENTIL or VENTOLIN or PROAIR   Take 1-2 Puffs by inhalation Every 6 hours as needed  Qty: 1 Each  Refills: 3     * albuterol sulfate 2.5 mg /3 mL (0.083 %) nebulizer solution  Commonly known as: PROVENTIL   Take 3 mL (2.5 mg total) by nebulization Every 4 hours as needed for Wheezing  Qty: 90 Each  Refills: 3     aspirin 81 mg Tablet, Delayed Release (E.C.)  Commonly known as: ECOTRIN   Take 1 Tablet (81 mg total) by mouth Once a day  Refills: 0     ezetimibe 10 mg Tablet  Commonly known as: ZETIA   Take 1 Tablet (10 mg total) by mouth Every evening  Refills: 0     FLUoxetine 20 mg Capsule  Commonly known as: PROzac   TAKE 1 CAPSULE BY MOUTH EVERY DAY  Qty: 90 Capsule  Refills: 1     metoprolol succinate 25 mg Tablet Sustained Release 24 hr  Commonly known as: TOPROL-XL   TAKE 1 TABLET BY MOUTH EVERY DAY AT NIGHT  Qty: 90 Tablet  Refills: 1     omeprazole 20 mg Capsule, Delayed Release(E.C.)  Commonly known as: PRILOSEC    Take 1 Capsule (20 mg total) by mouth Once a day  Qty: 90 Capsule  Refills: 1     tiZANidine 4 mg Tablet  Commonly known as: ZANAFLEX   TAKE 1 TABLET THREE TIMES A DAY AS NEEDED FOR MUSCLE CRAMPS Strength: 4 mg  Qty: 90 Tablet  Refills: 3     VITAMIN D-3 ORAL   Take 2,000 Int'l Units/day by mouth Once a day  Refills: 0           * This list has 2 medication(s) that are the same as other medications prescribed for you. Read the directions carefully, and ask your doctor or other care provider to review them with you.                STOP taking these medications.      amLODIPine 2.5 mg Tablet  Commonly known as: NORVASC     Irbesartan 75 mg Tablet  Commonly known as: AVAPRO            Discharge med list refreshed?  YES     Allergies   Allergen Reactions    Amoxicillin     Augmentin [Amoxicillin-Pot Clavulanate] Nausea/ Vomiting    Statins-Hmg-Coa Reductase Inhibitors Myalgia     HOSPITAL PROCEDURE(S):   No orders of the defined types were placed in this encounter.      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 The patient reported significant weakness/pain/fatigue in her bilateral shoulders and hips along with pain in her temporal region in the days preceding her admission. 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 She was started on Prednisone 50 mg daily which she has responded significantly to. TTE performed during this admission was without any acute abnormalities, LVEF 55-60% and no WMA's - appeared similar to previous TTE. She received a very brief course of antibiotics  for  concerns of pneumonia, however her CXR was clear and she had no evidence of infectious etiology of her hypoxia therefore those were stopped. She was weaned to room air and was tolerating that well. Her diarrhea is chronic, however she was tested with GI panel and found to be enteropathogenic E. Coli positive - conservatively managed. She is stable on day of discharge and eager to go home. Will be discharged home with husband and son at bedside.       TRANSITION/POST DISCHARGE CARE/PENDING TESTS/REFERRALS:     1). Continue with high dose PO Prednisone 50 mg daily for at least 2-4 weeks for suspected PMR/GCA given high inflammatory markers, clinical picture and resolution of symptoms with prednisone. Will need a long taper - will defer to either PCP or Rheumatology outpatient.   2). Monitor blood glucose levels strictly while on prednisone due to risk for hyperglycemia.   3). Recommend starting Cholestyramine daily with food for chronic diarrhea issues. Remainder of care per GI recommendations.   4). Repeat BMP to monitor sodium and kidney function outpatient.   5). Follow up with PCP, Dr. Amalia Greenhouse, early next week.   6). Follow up with GI, Cardiology and Nephrology PRN.     CONDITION ON DISCHARGE:  A. Ambulation: Full ambulation  B. Self-care Ability: Complete  C. Cognitive Status Alert and Oriented x 3  D. Code status at discharge: FULL CODE       LINES/DRAINS/WOUNDS AT DISCHARGE:   Patient Lines/Drains/Airways Status       Active Line / Dialysis Catheter / Dialysis Graft / Drain / Airway / Wound       None                    DISCHARGE DISPOSITION:  Home discharge  DISCHARGE INSTRUCTIONS:  Post-Discharge Follow Up Appointments       Friday Nov 01, 2022    Return Patient Visit with Clenton Pare, MD at  1:00 PM      Thursday Dec 12, 2022    Return Patient Visit with Gillermina Hu, MD at 11:20 AM      Monday Jan 06, 2023    Return Patient Visit with Samella Parr, DO at  1:20 PM      Tuesday Feb 25, 2023     Return Patient Visit with Wanda Plump, PA-C at 10:00 AM      Thursday Apr 17, 2023    Return Patient Visit with Clenton Pare, MD at  1:30 PM      Cardiology [place], [facility]  [facility], [place] [address]  [phone] Family Medicine, [place], [place] [address]  [phone] Gastroenterology, Physician Office Building  Physician Office Building, [facility]  [place] [address]  [phone]    Nephrology, Physician Office Building  Physician Office Building, [facility]  [place] [address]  [phone]          No discharge procedures on file.       Allen Derry, DO 12:50 PM  Internal Medicine     Copies sent to Care Team         Relationship Specialty Notifications Start End    Samella Parr, [place] PCP - [place] Admissions 03/04/22     Phone: [phone] Fax: [phone]  [address] [place] [address]            Referring providers can utilize [email or web address] to access their referred [place] patient's information.

## 2022-11-01 NOTE — Nurses Notes (Signed)
Patient a/o sitting up in bed talking with family members, patient waiting to be discharge to home, patient is room air, no complaints of pain or discomfort at this time, patient has a cough with productive sputum, patient blood pressure is better 161/88, HR 57, patient takes meds whole with water, without  difficulty, patient has 2 IV'S both flush without difficulty, patient call light and fluids in reach, will continue to monitor patient.

## 2022-11-04 ENCOUNTER — Other Ambulatory Visit: Payer: Self-pay

## 2022-11-04 ENCOUNTER — Encounter (INDEPENDENT_AMBULATORY_CARE_PROVIDER_SITE_OTHER): Payer: Self-pay | Admitting: Family Medicine

## 2022-11-04 ENCOUNTER — Telehealth (HOSPITAL_COMMUNITY): Payer: Self-pay | Admitting: Family Medicine

## 2022-11-04 ENCOUNTER — Ambulatory Visit: Payer: Medicare PPO | Attending: Family Medicine | Admitting: Family Medicine

## 2022-11-04 VITALS — BP 164/90 | HR 68 | Temp 97.7°F | Resp 18 | Ht 64.0 in | Wt 210.8 lb

## 2022-11-04 DIAGNOSIS — Z6836 Body mass index (BMI) 36.0-36.9, adult: Secondary | ICD-10-CM | POA: Insufficient documentation

## 2022-11-04 DIAGNOSIS — E1122 Type 2 diabetes mellitus with diabetic chronic kidney disease: Secondary | ICD-10-CM | POA: Insufficient documentation

## 2022-11-04 DIAGNOSIS — N1831 Chronic kidney disease, stage 3a (CMS HCC): Secondary | ICD-10-CM | POA: Insufficient documentation

## 2022-11-04 DIAGNOSIS — M315 Giant cell arteritis with polymyalgia rheumatica: Secondary | ICD-10-CM | POA: Insufficient documentation

## 2022-11-04 DIAGNOSIS — Z09 Encounter for follow-up examination after completed treatment for conditions other than malignant neoplasm: Secondary | ICD-10-CM | POA: Insufficient documentation

## 2022-11-04 DIAGNOSIS — K529 Noninfective gastroenteritis and colitis, unspecified: Secondary | ICD-10-CM

## 2022-11-04 DIAGNOSIS — I10 Essential (primary) hypertension: Secondary | ICD-10-CM | POA: Insufficient documentation

## 2022-11-04 DIAGNOSIS — R0902 Hypoxemia: Secondary | ICD-10-CM

## 2022-11-04 DIAGNOSIS — I129 Hypertensive chronic kidney disease with stage 1 through stage 4 chronic kidney disease, or unspecified chronic kidney disease: Secondary | ICD-10-CM | POA: Insufficient documentation

## 2022-11-04 DIAGNOSIS — Z7689 Persons encountering health services in other specified circumstances: Secondary | ICD-10-CM

## 2022-11-04 DIAGNOSIS — Z6835 Body mass index (BMI) 35.0-35.9, adult: Secondary | ICD-10-CM

## 2022-11-04 DIAGNOSIS — E669 Obesity, unspecified: Secondary | ICD-10-CM | POA: Insufficient documentation

## 2022-11-04 DIAGNOSIS — Z7985 Long-term (current) use of injectable non-insulin antidiabetic drugs: Secondary | ICD-10-CM | POA: Insufficient documentation

## 2022-11-04 NOTE — Progress Notes (Signed)
FAMILY MEDICINE, [facility] BUILDING  [place] [address]    Transitional Care Management Visit    Name: Joanna Black  MRN: [id number]    Date: 11/04/2022  Age: 76 y.o.      Chief Complaint: Hospital Follow Up and Hospital Discharge Transition    History of Present Illness:  Joanna Black is a 76 y.o. female is seen for transitional care management.    She is here today for follow-up for transition of care for hospitalization from November 7th to November 01, 2022. She had presented to the emergency room on November 7th with shortness of breath and weakness.  She was found to be hypoxic at that time and started on oxygen.  She was diagnosed with giant cell arteritis with polymyalgia rheumatica during that hospitalization.  Her other main issues include type 2 diabetes, chronic diarrhea and hypertension.  For polymyalgia rheumatica she is now on high dose steroid and she improved significantly with steroids while she was in the hospital.  That will be maintained for a period of time and then tapered off.  She has type 2 diabetes  with last A1c stable in March of 2023. She is taking Trulicity for type 2 diabetes.  However, the addition of high-dose steroids may complicate type 2 diabetes and that will need to be monitored.  She also has decreased kidney function that will need to be monitored in the future.  She has had chronic diarrhea ongoing for some time and hospitalization has made that increased some most likely because of medications.  I advised her to get some probiotics and take those to see if that helps.  She is not hypoxic today and pulse ox is 97% room air.  Breathing is much better.  Blood pressure however is elevated.  For hypertension she is taking Toprol XL daily.  She has gained 4 lb since last visit with a BMI of 35.96 and she is not having any pain.  She appears stable today. Otherwise this patient is doing well today with no current or recent respiratory infections, fever,  chills, cough, wheeze, GI complaints, headache, chest pain, increased shortness of breath or ankle swelling.      Past Medical History:  She has a past medical history of Arthritis, Asthma, Back problem, BMI 35.0-35.9,adult (02/16/2020), CAD (coronary artery disease), Cataract, Cataracts, bilateral, CHF (congestive heart failure) (CMS HCC), Chronic bronchitis with emphysema, Chronic low back pain, Claustrophobia, Depression, Diabetes (CMS [place]), Diabetes mellitus, type 2 (CMS HCC), Esophageal reflux, Essential hypertension (01/28/2019), H/O urinary tract infection, HH (hiatus hernia), History of kidney disease, Hypercholesterolemia, Hypertension, Hypertriglyceridemia, and Type 2 diabetes mellitus (CMS [place]).    She has no past medical history of Breast CA (CMS HCC), Cancer (CMS HCC), Cervical cancer (CMS [place]), Colon cancer (CMS [place]), Endometrial cancer (CMS [place]), Ovarian cancer (CMS [place]), Treatment, or Uterine cancer (CMS [place]).    Past Surgical History:  She has a past surgical history that includes hx hysterectomy; hx carpal tunnel release; hx tubal ligation; hx gall bladder surgery/chole; hx vein stripping; hx exposure to metal shavings; Esophagogastroduodenoscopy; colonoscopy; hx tah and bso; hx heart catheterization; Cataract extraction, bilateral (Bilateral); hx cataract removal (Right, 12/30/2019); hx cataract removal (Left, 01/06/2020); and hx oophorectomy.    Problem List:  She has Type 2 diabetes mellitus with stage 3a chronic kidney disease, without long-term current use of insulin (CMS HCC); Cervical pain (neck); Thoracic back pain; Chronic bilateral low back pain; Essential hypertension; Cardiomyopathy, dilated,  nonischemic (CMS HCC); Chronic GERD; Chronic systolic congestive heart failure (CMS HCC); BMI 35.0-35.9,adult; Mixed hyperlipidemia; Moderate episode of recurrent major depressive disorder (CMS HCC); CKD (chronic kidney disease) stage 3, 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/min (CMS HCC); History of colon polyps; Colon  cancer screening; Gastroesophageal reflux disease; Dysphagia; Esophageal stricture; Chronic diarrhea; Diverticulosis of colon; Hypoxia; 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); Enteropathogenic Escherichia coli infection; and Encounter for support and coordination of transition of care on their problem list.    Medications:    acetaminophen    albuterol sulfate    albuterol sulfate    aspirin    calcium carbonate/vitamin D3 (VITAMIN D-3 ORAL)    cholestyramine-aspartame    ezetimibe    FLUoxetine    metoprolol succinate    omeprazole    predniSONE    tiZANidine    Trulicity     Review of Systems:  See HPI above.      Exam:   BP (!) 164/90 (Site: Left, Patient Position: Sitting, Cuff Size: Adult)   Pulse 68   Temp 36.5 C (97.7 F) (Temporal)   Resp 18   Ht 1.626 m (5\' 4" )   Wt 95.6 kg (210 lb 12.8 oz)   SpO2 97%   BMI 36.18 kg/m     Physical Exam  Vitals and nursing note reviewed.   Constitutional:       General: She is not in acute distress.     Appearance: Normal appearance. She is obese. She is not ill-appearing.   HENT:      Head: Normocephalic and atraumatic.      Nose: No congestion.   Eyes:      General:         Right eye: No discharge.         Left eye: No discharge.      Extraocular Movements: Extraocular movements intact.      Conjunctiva/sclera: Conjunctivae normal.      Pupils: Pupils are equal, round, and reactive to light.   Cardiovascular:      Rate and Rhythm: Normal rate and regular rhythm.      Heart sounds: Normal heart sounds.   Pulmonary:      Effort: Pulmonary effort is normal. No respiratory distress.      Breath sounds: Normal breath sounds. No wheezing or rales.   Abdominal:      General: There is no distension.   Musculoskeletal:         General: No tenderness or signs of injury.      Cervical back: Normal range of motion and neck supple.      Right lower leg: No edema.      Left lower leg: No edema.   Skin:     General: Skin is warm and dry.   Neurological:       Mental Status: She is alert and oriented to person, place, and time. Mental status is at baseline.      Motor: Weakness present.      Coordination: Coordination normal.      Gait: Gait normal.   Psychiatric:         Mood and Affect: Mood normal.         Behavior: Behavior normal.           Transition of Care Contact Information  Contact provided with this visit within 2 buisiness days   Discharge date:: 11/01/2022    Data Reviewed  Medication Reconciliation completed     Assessment/Plan:  ICD-10-CM    1. Encounter for support and coordination of transition of care/reviewed hospitalization notes from November 7th through November 10th and discussed with the patient.     1. 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) /she was diagnosed with this while in the hospital recently. M31.5 Continue prednisone 50 mg 1 p.o. q.a.m. with breakfast and re-evaluate in the near future.      2. Type 2 diabetes mellitus with stage 3a chronic kidney disease, without long-term current use of insulin (CMS HCC)  E11.22 Lab Results   Component Value Date    HA1C 5.9 03/13/2022     Lab Results   Component Value Date    GLUCOSENF 140 (H) 11/01/2022     Last A1c is stable back in March of 2023 and recent glucose was not too bad considering she has been ill and on steroids.  Continue Trulicity 0.75 mg subQ weekly and recheck in the future.    N18.31       3. Chronic diarrhea  K52.9 Advised probiotics daily over-the-counter.  Increased problems since hospitalization.      4. Hypoxia  R09.02 Improved since hospitalization with pulse ox of 97% room air today.  Continue to monitor.      5. Essential hypertension  I10 Blood pressure elevated today.  Continue Toprol XL 25 mg 1 p.o. q.day and recheck in the future.      6. BMI 35.0-35.9,adult  Z68.35 Weight elevated 4 lb since last visit with a BMI of 35.96.  Continue to monitor.      7. Stage 3a chronic kidney disease (CMS HCC)  N18.31   GFR  Lab Results   Component Value Date     GFR 45 (L) 11/01/2022     Last GFR is above and decreased.  Continue to monitor in the future.         Other transition actions (Optional) -: Discharge documentation was reviewed and Discharge documentation used to reconcile outpatient medication list    Return in about 1 week (around 11/11/2022).     Jobe Marker, DO

## 2022-11-04 NOTE — Telephone Encounter (Signed)
Transition of Care Contact Information  Discharge Date: 11/01/2022  Transition Facility Type--Hospital (Inpatient or Observation)  Facility Name--[facility]  Interactive Contact(s): Completed or attempted contact indicated by Date/Time  Clinical Staff Name/Role who Aleene Davidson, RN  Transition Note:TOCC deferred due to patient already completing hospital f/u on 11/04/2022.

## 2022-11-10 NOTE — Progress Notes (Unsigned)
[facility]  [address]  [place], [place] [address]  P: [phone]  F: [phone]       NAME:  Joanna Black  DOB:  [birth year]  AGE:  76 y.o.  MRN:  [id number]  APPT:  11/11/2022     No chief complaint on file.     Subjective:     This is a case of a 76 y.o. year old female who comes in today for ***    Past Medical History:   Diagnosis Date    Arthritis     Asthma     Back problem     BMI 35.0-35.9,adult 02/16/2020    CAD (coronary artery disease)     mild    Cataract     Cataracts, bilateral     CHF (congestive heart failure) (CMS HCC)     Chronic bronchitis with emphysema     Chronic low back pain     Claustrophobia     Depression     Diabetes (CMS HCC)     Diabetes mellitus, type 2 (CMS HCC)     Esophageal reflux     Essential hypertension 01/28/2019    H/O urinary tract infection     HH (hiatus hernia)     History of kidney disease     states, "low kidney functions."    Hypercholesterolemia     Hypertension     Hypertriglyceridemia     Type 2 diabetes mellitus (CMS HCC)          Past Surgical History:   Procedure Laterality Date    CATARACT EXTRACTION, BILATERAL Bilateral     bilateral lens implant    COLONOSCOPY      ENDOSCOPIC U/S UPPER with FNA N/A 05/10/2022    Performed by Edwin Cap, DO at [facility] OR ENDO    ESOPHAGOGASTRODUODENOSCOPY      GASTROSCOPY  05/10/2022    Performed by Edwin Cap, DO at [facility] OR ENDO    GASTROSCOPY WITH DILATION Left 09/24/2022    Performed by Joslyn Devon, MD at [facility] OR ENDO    GASTROSCOPY WITH DILATION Left 06/05/2022    Performed by Joslyn Devon, MD at [facility] OR ENDO    GASTROSCOPY WITH DILATION and bxs Left 05/07/2022    Performed by Joslyn Devon, MD at [facility] OR ENDO    HX CARPAL TUNNEL RELEASE      HX CATARACT REMOVAL Right 12/30/2019    HX CATARACT REMOVAL Left 01/06/2020    HX CHOLECYSTECTOMY      HX EXPOSURE TO METAL SHAVINGS      HX HEART CATHETERIZATION      HX HYSTERECTOMY      HX OOPHORECTOMY      HX TAH AND BSO      HX TUBAL  LIGATION      HX VEIN STRIPPING      Left leg    PHACO WITH INTRAOCULAR LENS Left 01/06/2020    Performed by Kathleen Argue, MD at [facility] OR MAIN    PHACO WITH INTRAOCULAR LENS Right 12/30/2019    Performed by Kathleen Argue, MD at [facility] OR MAIN     Family Medical History:       Problem Relation (Age of Onset)    Bone cancer Father    Diabetes Multiple family members    Hypertension (High Blood Pressure) Multiple family members    No Known Problems Mother    Stomach Cancer Paternal Grandmother  Social History     Socioeconomic History    Marital status: Married     Spouse name: Duke Salvia    Number of children: 3    Years of education: 13    Highest education level: High school graduate   Occupational History    Occupation: Retired   Tobacco Use    Smoking status: Never     Passive exposure: Never    Smokeless tobacco: Never   Vaping Use    Vaping Use: Never used   Substance and Sexual Activity    Alcohol use: No     Alcohol/week: 0.0 standard drinks of alcohol    Drug use: No    Sexual activity: Not Currently     Partners: Male   Other Topics Concern    Right hand dominant Yes    Ability to Walk 1 Flight of Steps without SOB/CP No    Ability To Do Own ADL's Yes   Social History Narrative    MARRIED.  Lives in single story home.  Heats home with electric and has well water.        April Haney, LPN  2/63/3354, 14:01     Social Determinants of Health     Financial Resource Strain: Low Risk  (11/04/2022)    Financial Resource Strain     SDOH Financial: No   Transportation Needs: Low Risk  (11/04/2022)    Transportation Needs     SDOH Transportation: No   Social Connections: Medium Risk (11/04/2022)    Social Connections     SDOH Social Isolation: 3 to 5 times a week   Intimate Partner Violence: Low Risk  (11/04/2022)    Intimate Partner Violence     SDOH Domestic Violence: No   Housing Stability: Low Risk  (11/04/2022)    Housing Stability     SDOH Housing Situation: I have housing.     SDOH Housing Worry: No      Current Outpatient Medications   Medication Sig    acetaminophen (TYLENOL) 500 mg Oral Tablet Take 1 Tablet (500 mg total) by mouth Every night as needed Takes 2 at night    albuterol sulfate (PROVENTIL OR VENTOLIN OR PROAIR) 90 mcg/actuation Inhalation oral inhaler Take 1-2 Puffs by inhalation Every 6 hours as needed    albuterol sulfate (PROVENTIL) 2.5 mg /3 mL (0.083 %) Inhalation nebulizer solution Take 3 mL (2.5 mg total) by nebulization Every 4 hours as needed for Wheezing    aspirin (ECOTRIN) 81 mg Oral Tablet, Delayed Release (E.C.) Take 1 Tablet (81 mg total) by mouth Once a day    calcium carbonate/vitamin D3 (VITAMIN D-3 ORAL) Take 2,000 Int'l Units/day by mouth Once a day    cholestyramine-aspartame (PREVALITE) 4 gram Oral Powder in Packet Take 1 Packet (4 g total) by mouth Every evening with dinner for 30 days (Patient not taking: Reported on 11/04/2022)    ezetimibe (ZETIA) 10 mg Oral Tablet Take 1 Tablet (10 mg total) by mouth Every evening    FLUoxetine (PROZAC) 20 mg Oral Capsule TAKE 1 CAPSULE BY MOUTH EVERY DAY    metoprolol succinate (TOPROL-XL) 25 mg Oral Tablet Sustained Release 24 hr TAKE 1 TABLET BY MOUTH EVERY DAY AT NIGHT    omeprazole (PRILOSEC) 20 mg Oral Capsule, Delayed Release(E.C.) Take 1 Capsule (20 mg total) by mouth Once a day    predniSONE (DELTASONE) 50 mg Oral Tablet Take 1 Tablet (50 mg total) by mouth Every morning with breakfast for 14 days  tiZANidine (ZANAFLEX) 4 mg Oral Tablet TAKE 1 TABLET THREE TIMES A DAY AS NEEDED FOR MUSCLE CRAMPS Strength: 4 mg    TRULICITY 0.75 mg/0.5 mL Subcutaneous Pen Injector INJECT 0.5 ML (0.75 MG) UNDER  THE SKIN EVERY 7 DAYS (Patient taking differently: sundays)     Review of Systems  Objective:   There were no vitals taken for this visit.      Physical Exam    PHQ Questionnaire       Assessment/Plan   1. Type 2 diabetes mellitus (CMS HCC)  ***    2. Hypertension associated with type 2 diabetes mellitus (CMS HCC)   ***    3. 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 Hyperlipidemia associated with type 2 diabetes mellitus (CMS HCC)   ***    5. 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          Health Maintenance Due   Topic Date Due    Hepatitis C screening  Never done    Adult Tdap-Td (1 - Tdap) Never done    Shingles Vaccine (2 of 3) 12/16/2013    Diabetic Retinal Exam  10/25/2021    Covid-19 Vaccine (5 - 2023-24 season) 08/23/2022       Controlled Substances Tracking      Follow up: No follow-ups on file.    The patient was given ample opportunity to ask questions and those questions were answered to the patient's satisfaction. The patient was encouraged to be involved in their own care, and all diagnoses, medications, and medication side-effects were discussed. The patient was told to contact me with any additional questions or concerns, or go to the ED in the case of an emergency.       Jeda Pardue, DO

## 2022-11-11 ENCOUNTER — Encounter (INDEPENDENT_AMBULATORY_CARE_PROVIDER_SITE_OTHER): Payer: Self-pay | Admitting: Family Medicine

## 2022-11-11 ENCOUNTER — Other Ambulatory Visit: Payer: Self-pay

## 2022-11-11 ENCOUNTER — Ambulatory Visit: Payer: Medicare PPO | Attending: Family Medicine | Admitting: Family Medicine

## 2022-11-11 VITALS — BP 136/82 | HR 54 | Temp 97.3°F | Resp 18 | Ht 64.02 in | Wt 209.6 lb

## 2022-11-11 DIAGNOSIS — F325 Major depressive disorder, single episode, in full remission: Secondary | ICD-10-CM | POA: Insufficient documentation

## 2022-11-11 DIAGNOSIS — Z6835 Body mass index (BMI) 35.0-35.9, adult: Secondary | ICD-10-CM | POA: Insufficient documentation

## 2022-11-11 DIAGNOSIS — E785 Hyperlipidemia, unspecified: Secondary | ICD-10-CM | POA: Insufficient documentation

## 2022-11-11 DIAGNOSIS — E1159 Type 2 diabetes mellitus with other circulatory complications: Secondary | ICD-10-CM | POA: Insufficient documentation

## 2022-11-11 DIAGNOSIS — M315 Giant cell arteritis with polymyalgia rheumatica: Secondary | ICD-10-CM | POA: Insufficient documentation

## 2022-11-11 DIAGNOSIS — I129 Hypertensive chronic kidney disease with stage 1 through stage 4 chronic kidney disease, or unspecified chronic kidney disease: Secondary | ICD-10-CM | POA: Insufficient documentation

## 2022-11-11 DIAGNOSIS — E1122 Type 2 diabetes mellitus with diabetic chronic kidney disease: Secondary | ICD-10-CM | POA: Insufficient documentation

## 2022-11-11 DIAGNOSIS — E119 Type 2 diabetes mellitus without complications: Secondary | ICD-10-CM | POA: Insufficient documentation

## 2022-11-11 DIAGNOSIS — N183 Chronic kidney disease, stage 3 unspecified: Secondary | ICD-10-CM | POA: Insufficient documentation

## 2022-11-11 DIAGNOSIS — M353 Polymyalgia rheumatica: Secondary | ICD-10-CM | POA: Insufficient documentation

## 2022-11-11 DIAGNOSIS — I152 Hypertension secondary to endocrine disorders: Secondary | ICD-10-CM | POA: Insufficient documentation

## 2022-11-11 DIAGNOSIS — Z09 Encounter for follow-up examination after completed treatment for conditions other than malignant neoplasm: Secondary | ICD-10-CM | POA: Insufficient documentation

## 2022-11-11 DIAGNOSIS — E1169 Type 2 diabetes mellitus with other specified complication: Secondary | ICD-10-CM | POA: Insufficient documentation

## 2022-11-13 ENCOUNTER — Encounter (INDEPENDENT_AMBULATORY_CARE_PROVIDER_SITE_OTHER): Payer: Self-pay | Admitting: Vascular & Interventional Radiology

## 2022-11-16 ENCOUNTER — Encounter (INDEPENDENT_AMBULATORY_CARE_PROVIDER_SITE_OTHER): Payer: Self-pay | Admitting: Family Medicine

## 2022-11-16 DIAGNOSIS — Z7689 Persons encountering health services in other specified circumstances: Secondary | ICD-10-CM | POA: Insufficient documentation

## 2022-11-16 HISTORY — DX: Persons encountering health services in other specified circumstances: Z76.89

## 2022-11-16 NOTE — Assessment & Plan Note (Signed)
Lab Results   Component Value Date    HA1C 5.9 03/13/2022     Lab Results   Component Value Date    GLUCOSENF 140 (H) 11/01/2022   Has been stable. Continue trulicity 0.75mg  sq weekly.

## 2022-11-22 ENCOUNTER — Other Ambulatory Visit (INDEPENDENT_AMBULATORY_CARE_PROVIDER_SITE_OTHER): Payer: Self-pay | Admitting: Family Medicine

## 2022-11-22 ENCOUNTER — Ambulatory Visit (INDEPENDENT_AMBULATORY_CARE_PROVIDER_SITE_OTHER): Payer: Self-pay | Admitting: Family Medicine

## 2022-11-22 DIAGNOSIS — M315 Giant cell arteritis with polymyalgia rheumatica: Secondary | ICD-10-CM

## 2022-11-22 MED ORDER — PREDNISONE 50 MG TABLET
50.0000 mg | ORAL_TABLET | Freq: Every morning | ORAL | 0 refills | Status: DC
Start: 2022-11-22 — End: 2022-12-10

## 2022-11-22 NOTE — Telephone Encounter (Signed)
Called and informed patient of doctors notes, patient voiced understanding and will call with update in two weeks.  Tyler Aas, [place] 11/22/2022 12:14

## 2022-11-22 NOTE — Telephone Encounter (Signed)
Please contact patient and make her aware order has been placed for refill of Prednisone 50 mg po once daily for 14 days. Pending patient's response we will need to consider tapering to lower dose. Please remind patient she has appt. with Vascular Specialist on 01/17/22. Patient to provide office update on her condition within next two weeks. Thank you.

## 2022-11-22 NOTE — Telephone Encounter (Signed)
Patient called and left message saying she was taken off prednisone and "now it is back" and she needs put back on prednisone. She would like office to call her as soon as this is done and not wait till the end of the day to get notified.  Tyler Aas, [place] 11/22/2022 08:20

## 2022-11-26 ENCOUNTER — Ambulatory Visit (HOSPITAL_BASED_OUTPATIENT_CLINIC_OR_DEPARTMENT_OTHER): Payer: Medicare PPO

## 2022-11-26 ENCOUNTER — Ambulatory Visit (INDEPENDENT_AMBULATORY_CARE_PROVIDER_SITE_OTHER): Payer: Self-pay | Admitting: Family Medicine

## 2022-11-26 DIAGNOSIS — R519 Headache, unspecified: Secondary | ICD-10-CM

## 2022-11-26 MED ORDER — NAPROXEN 500 MG TABLET
500.0000 mg | ORAL_TABLET | Freq: Two times a day (BID) | ORAL | 3 refills | Status: DC | PRN
Start: 2022-11-26 — End: 2023-01-20

## 2022-11-26 NOTE — Telephone Encounter (Signed)
Called and asked patient if she was taking anything for headaches, patient stated that she took tylonel last night and it helped. Patient stated that her eyesight is more light sensitive and has not been able to do a lot of things is okay with prescription being called in.  Tyler Aas, [place] 11/26/2022 11:25

## 2022-11-26 NOTE — Telephone Encounter (Signed)
Patient called and left message saying she is still having issues with headaches. Stated arthritis is affecting her headaches and is now starting to have issues with her eyesight. She is taking 50 mg prednisone and wanted to know what she should do.  Tyler Aas, [place] 11/26/2022 11:07

## 2022-11-26 NOTE — Telephone Encounter (Signed)
Please contact patient and determine what she is currently taking for headache. Provider can send a prescription in for patient. Thank you.     Order placed for Naproxen 500 mg po bid prn to alternate with OTC Tylenol. Thank you.

## 2022-11-26 NOTE — Telephone Encounter (Signed)
Called and informed patient of medication being ordered, patient voiced understanding.  Tyler Aas, [place] 11/26/2022 13:58

## 2022-12-10 ENCOUNTER — Other Ambulatory Visit: Payer: Self-pay

## 2022-12-10 ENCOUNTER — Encounter (INDEPENDENT_AMBULATORY_CARE_PROVIDER_SITE_OTHER): Payer: Self-pay | Admitting: Family Medicine

## 2022-12-10 ENCOUNTER — Ambulatory Visit: Payer: Medicare PPO | Attending: Family Medicine | Admitting: Family Medicine

## 2022-12-10 VITALS — BP 110/82 | HR 68 | Temp 96.9°F | Resp 18 | Ht 64.02 in | Wt 209.4 lb

## 2022-12-10 DIAGNOSIS — N183 Chronic kidney disease, stage 3 unspecified: Secondary | ICD-10-CM | POA: Insufficient documentation

## 2022-12-10 DIAGNOSIS — M315 Giant cell arteritis with polymyalgia rheumatica: Secondary | ICD-10-CM | POA: Insufficient documentation

## 2022-12-10 DIAGNOSIS — E1159 Type 2 diabetes mellitus with other circulatory complications: Secondary | ICD-10-CM | POA: Insufficient documentation

## 2022-12-10 DIAGNOSIS — E1169 Type 2 diabetes mellitus with other specified complication: Secondary | ICD-10-CM | POA: Insufficient documentation

## 2022-12-10 DIAGNOSIS — F325 Major depressive disorder, single episode, in full remission: Secondary | ICD-10-CM | POA: Insufficient documentation

## 2022-12-10 DIAGNOSIS — Z7982 Long term (current) use of aspirin: Secondary | ICD-10-CM | POA: Insufficient documentation

## 2022-12-10 DIAGNOSIS — Z6835 Body mass index (BMI) 35.0-35.9, adult: Secondary | ICD-10-CM | POA: Insufficient documentation

## 2022-12-10 DIAGNOSIS — E785 Hyperlipidemia, unspecified: Secondary | ICD-10-CM | POA: Insufficient documentation

## 2022-12-10 DIAGNOSIS — Z7985 Long-term (current) use of injectable non-insulin antidiabetic drugs: Secondary | ICD-10-CM | POA: Insufficient documentation

## 2022-12-10 DIAGNOSIS — E1122 Type 2 diabetes mellitus with diabetic chronic kidney disease: Secondary | ICD-10-CM | POA: Insufficient documentation

## 2022-12-10 DIAGNOSIS — E119 Type 2 diabetes mellitus without complications: Secondary | ICD-10-CM | POA: Insufficient documentation

## 2022-12-10 DIAGNOSIS — I152 Hypertension secondary to endocrine disorders: Secondary | ICD-10-CM | POA: Insufficient documentation

## 2022-12-10 DIAGNOSIS — Z79899 Other long term (current) drug therapy: Secondary | ICD-10-CM | POA: Insufficient documentation

## 2022-12-10 DIAGNOSIS — M353 Polymyalgia rheumatica: Secondary | ICD-10-CM | POA: Insufficient documentation

## 2022-12-10 LAB — HGA1C (HEMOGLOBIN A1C WITH EST AVG GLUCOSE)
ESTIMATED AVERAGE GLUCOSE: 171
HEMOGLOBIN A1C: 7.6

## 2022-12-10 MED ORDER — PREDNISONE 5 MG TABLET
5.0000 mg | ORAL_TABLET | Freq: Every day | ORAL | 0 refills | Status: DC
Start: 2022-12-10 — End: 2023-02-13

## 2022-12-10 NOTE — Nursing Note (Signed)
12/10/22 0900   A1C   Time Performed 0927   A1C 7.6   Initials MIW

## 2022-12-10 NOTE — Progress Notes (Signed)
[name]  [address]  [place], [name] [address]  P: [phone]  F: [phone]       NAME:  Joanna Black  DOB:  [birth year]  AGE:  76 y.o.  MRN:  [id number]  APPT:  12/10/2022     Chief Complaint   Patient presents with    Blood Work     A1C 7.6    Follow Up      Subjective:     This is a case of a 76 y.o. year old female who comes in today with her husband for follow-up concerning history of type 2 diabetes, hypertension/CKD, hyperlipidemia, polymyalgia rheumatica with concern for giant cell arteritis, major depression, and severe obesity.  Today, we discussed/reviewed patient's medications and most recent lab results.  Patient is due for repeat POC A1c today. Patient denies recent symptoms of  fever, chills, night sweats, chest pain, irregular heart beats, shortness of breath, abdominal pain, or change in bowel/bladder function.     Past Medical History:   Diagnosis Date    Arthritis     Asthma     Back problem     BMI 35.0-35.9,adult 02/16/2020    CAD (coronary artery disease)     mild    Cataract     Cataracts, bilateral     CHF (congestive heart failure) (CMS HCC)     Chronic bronchitis with emphysema     Chronic low back pain     Claustrophobia     Depression     Diabetes (CMS [place])     Diabetes mellitus, type 2 (CMS HCC)     Encounter for support and coordination of transition of care 11/16/2022    Esophageal reflux     Essential hypertension 01/28/2019    H/O urinary tract infection     HH (hiatus hernia)     History of kidney disease     states, "low kidney functions."    Hypercholesterolemia     Hypertension     Hypertriglyceridemia     Type 2 diabetes mellitus (CMS HCC)        Past Surgical History:   Procedure Laterality Date    CATARACT EXTRACTION, BILATERAL Bilateral     bilateral lens implant    COLONOSCOPY      ENDOSCOPIC U/S UPPER with FNA N/A 05/10/2022    Performed by Freddie Apley, DO at [place]  05/10/2022     Performed by Freddie Apley, DO at [place] Left 09/24/2022    Performed by Clenton Pare, MD at [place] Left 06/05/2022    Performed by Clenton Pare, MD at [place] and bxs Left 05/07/2022    Performed by Clenton Pare, MD at [place] Right 12/30/2019    HX CATARACT REMOVAL Left 01/06/2020    HX CHOLECYSTECTOMY      HX EXPOSURE TO METAL SHAVINGS      HX HEART CATHETERIZATION      HX HYSTERECTOMY      HX OOPHORECTOMY      HX TAH AND BSO      HX TUBAL LIGATION      HX VEIN STRIPPING      Left leg  PHACO WITH INTRAOCULAR LENS Left 01/06/2020    Performed by Gwendalyn Ege, MD at [place] Right 12/30/2019    Performed by Gwendalyn Ege, MD at [place] History:       Problem Relation (Age of Onset)    Bone cancer Father    Diabetes Multiple family members    Hypertension (High Blood Pressure) Multiple family members    No Known Problems Mother    Stomach Cancer Paternal Grandmother            Social History     Socioeconomic History    Marital status: Married     Spouse name: [name]    Number of children: 3    Years of education: 71    Highest education level: High school graduate   Occupational History    Occupation: Retired   Tobacco Use    Smoking status: Never     Passive exposure: Never    Smokeless tobacco: Never   Vaping Use    Vaping Use: Never used   Substance and Sexual Activity    Alcohol use: No     Alcohol/week: 0.0 standard drinks of alcohol    Drug use: No    Sexual activity: Not Currently     Partners: Male   Other Topics Concern    Right hand dominant Yes    Ability to Walk 1 Flight of Steps without SOB/CP No    Ability To Do Own ADL's Yes   Social History Narrative    MARRIED.  Lives in single story home.  Heats home with electric and has well water.        Joanna Haney, LPN   08/01/1750, 02:58     Social Determinants of Health     Financial Resource Strain: Low Risk  (11/04/2022)    Financial Resource Strain     SDOH Financial: No   Transportation Needs: Low Risk  (11/04/2022)    Transportation Needs     SDOH Transportation: No   Social Connections: Medium Risk (11/04/2022)    Social Connections     SDOH Social Isolation: 3 to 5 times a week   Intimate Partner Violence: Low Risk  (11/04/2022)    Intimate Partner Violence     SDOH Domestic Violence: No   Housing Stability: Low Risk  (11/04/2022)    Housing Stability     SDOH Housing Situation: I have housing.     SDOH Housing Worry: No     Current Outpatient Medications   Medication Sig    acetaminophen (TYLENOL) 500 mg Oral Tablet Take 1 Tablet (500 mg total) by mouth Every night as needed Takes 2 at night    albuterol sulfate (PROVENTIL OR VENTOLIN OR PROAIR) 90 mcg/actuation Inhalation oral inhaler Take 1-2 Puffs by inhalation Every 6 hours as needed    albuterol sulfate (PROVENTIL) 2.5 mg /3 mL (0.083 %) Inhalation nebulizer solution Take 3 mL (2.5 mg total) by nebulization Every 4 hours as needed for Wheezing    aspirin (ECOTRIN) 81 mg Oral Tablet, Delayed Release (E.C.) Take 1 Tablet (81 mg total) by mouth Once a day    calcium carbonate/vitamin D3 (VITAMIN D-3 ORAL) Take 2,000 Int'l Units/day by mouth Once a day    ezetimibe (ZETIA) 10 mg Oral Tablet Take 1 Tablet (10 mg total) by mouth Every evening    FLUoxetine (PROZAC) 20 mg Oral Capsule TAKE 1 CAPSULE BY  MOUTH EVERY DAY    metoprolol succinate (TOPROL-XL) 25 mg Oral Tablet Sustained Release 24 hr TAKE 1 TABLET BY MOUTH EVERY DAY AT NIGHT    naproxen (NAPROSYN) 500 mg Oral Tablet Take 1 Tablet (500 mg total) by mouth Twice per day as needed for Pain    omeprazole (PRILOSEC) 20 mg Oral Capsule, Delayed Release(E.C.) Take 1 Capsule (20 mg total) by mouth Once a day    predniSONE (DELTASONE) 5 mg Oral Tablet Take 1 Tablet (5 mg total) by mouth Once a day    tiZANidine (ZANAFLEX) 4  mg Oral Tablet TAKE 1 TABLET THREE TIMES A DAY AS NEEDED FOR MUSCLE CRAMPS Strength: 4 mg    TRULICITY 7.01 XB/9.3 mL Subcutaneous Pen Injector INJECT 0.5 ML (0.75 MG) UNDER  THE SKIN EVERY 7 DAYS (Patient taking differently: sundays)     Review of Systems   Constitutional:  Negative for activity change, appetite change, chills, fatigue, fever and unexpected weight change.   HENT:  Negative for congestion, postnasal drip, rhinorrhea, sinus pain, sore throat and trouble swallowing.    Eyes:  Negative for visual disturbance.   Respiratory:  Negative for cough, chest tightness, shortness of breath and wheezing.    Cardiovascular:  Negative for chest pain, palpitations and leg swelling.   Gastrointestinal:  Negative for abdominal pain, constipation, diarrhea, nausea and vomiting.   Genitourinary:  Negative for difficulty urinating and dysuria.   Musculoskeletal:  Negative for arthralgias, back pain and myalgias.   Skin:  Negative for rash and wound.   Neurological:  Negative for dizziness, weakness, light-headedness and headaches.   Hematological:  Negative for adenopathy.   Psychiatric/Behavioral:  Negative for confusion, decreased concentration and sleep disturbance. The patient is not nervous/anxious.    All other systems reviewed and are negative.    Objective:   BP 110/82 (Site: Left, Patient Position: Sitting, Cuff Size: Adult)   Pulse 68   Temp 36.1 C (96.9 F) (Temporal)   Resp 18   Ht 1.626 m (5' 4.02")   Wt 95 kg (209 lb 6.4 oz)   SpO2 97%   BMI 35.93 kg/m       Physical Exam  Vitals reviewed.   Constitutional:       General: She is not in acute distress.     Appearance: Normal appearance. She is obese. She is not ill-appearing, toxic-appearing or diaphoretic.   HENT:      Head: Normocephalic and atraumatic.   Eyes:      General: No scleral icterus.        Right eye: No discharge.         Left eye: No discharge.      Extraocular Movements: Extraocular movements intact.      Conjunctiva/sclera:  Conjunctivae normal.   Cardiovascular:      Rate and Rhythm: Normal rate and regular rhythm.      Pulses: Normal pulses.      Heart sounds: Normal heart sounds.   Pulmonary:      Effort: Pulmonary effort is normal. No respiratory distress.      Breath sounds: Normal breath sounds. No wheezing, rhonchi or rales.   Musculoskeletal:         General: No swelling, tenderness or deformity. Normal range of motion.      Cervical back: Normal range of motion and neck supple.      Right lower leg: No edema.      Left lower leg: No edema.   Skin:  General: Skin is warm.      Capillary Refill: Capillary refill takes less than 2 seconds.   Neurological:      General: No focal deficit present.      Mental Status: She is alert and oriented to person, place, and time. Mental status is at baseline.      Cranial Nerves: No cranial nerve deficit.      Sensory: No sensory deficit.      Motor: No weakness.      Coordination: Coordination normal.      Gait: Gait normal.      Deep Tendon Reflexes: Reflexes normal.   Psychiatric:         Mood and Affect: Mood normal.         Behavior: Behavior normal.         Thought Content: Thought content normal.         Judgment: Judgment normal.       Assessment/Plan     1. Type 2 diabetes mellitus (CMS HCC)  Diabetes Monitors  A1C - Glucose - Lipids Microalbumin   Results in Last 18 Months   Lab Test 11/29/21  0847 03/13/22  0000 10/03/22  0000 12/10/22  0000   HA1C 6.3* 5.9  --  7.6   POCTHA1C  --   --  6.3*  --    CHOLESTEROL 221*  --   --   --    HDLCHOL 57  --   --   --    LDLCHOL 124*  --   --   --    TRIG 200*  --   --   --     Results in Last 18 Months   Lab Test 04/09/22  1239   MICALBRNUR 1.0   MICALBCRERAT 12.3        Retinal Exam Date: 10/26/2019 DM Retinopathy - Negative Date--10/26/2019  Last Foot Exam: 03/13/2022  POC A1C elevated at 7.6 likely secondary to patient's recent steroid use   Plan to continue Trulicity 8.25 mg under the skin once every 7 days  - POCT Hgb A1c  - CBC/DIFF;  Future  - THYROID STIMULATING HORMONE (SENSITIVE TSH); Future  - Comp Metabolic Panel-Non Fasting; Future  - Magnesium; Future  - Hemoglobin A1C; Future  - Urine Microalbumin/Creatinine Ratio, Random; Future  - Urinalysis with Reflex Microscopic and Culture if Positive; Future    2. Hypertension associated with type 2 diabetes mellitus (CMS HCC)/CKD (chronic kidney disease) stage 3, 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/min (CMS HCC)  BP Readings from Last 2 Encounters:   12/10/22 110/82   11/11/22 136/82   Patient presents with normal blood pressure 110/82 with heart rate of 68  Continue aspirin 81 mg p.o. once daily along with Toprol-XL 25 mg p.o. nightly    3. Hyperlipidemia associated with type 2 diabetes mellitus (CMS HCC)   Lab Results   Component Value Date    CHOLESTEROL 221 (H) 11/29/2021    HDLCHOL 57 11/29/2021    LDLCHOL 124 (H) 11/29/2021    TRIG 200 (H) 11/29/2021   Continue Zetia 10 mg p.o. once every evening  - Lipid Panel; Future    4. 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 with Rheumatology on 01/28/2023  Will follow  - predniSONE (DELTASONE) 5 mg Oral Tablet; Take 1 Tablet (5 mg total) by  mouth Once a day  Dispense: 90 Tablet; Refill: 0  - C Reactive Protein (CRP) Inflammation; Future  - SEDIMENTATION RATE; Future    5. 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 once daily    6. 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    Orders Placed This Encounter    HGA1C (HEMOGLOBIN A1C WITH EST AVG GLUCOSE)    CBC/DIFF    THYROID STIMULATING HORMONE (SENSITIVE TSH)    Comp Metabolic Panel-Non Fasting    Lipid Panel    Magnesium    Hemoglobin A1C    Urine Microalbumin/Creatinine Ratio, Random    Urinalysis with Reflex Microscopic  and Culture if Positive    C Reactive Protein (CRP) Inflammation    SEDIMENTATION RATE    POCT Hgb A1c    predniSONE (DELTASONE) 5 mg Oral Tablet     Health Maintenance Due   Topic Date Due    Hepatitis C screening  Never done    Adult Tdap-Td (1 - Tdap) Never done    Shingles Vaccine (2 of 3) 12/16/2013    Diabetic Retinal Exam  10/25/2021    Covid-19 Vaccine (5 - 2023-24 season) 08/23/2022     Follow up: Return in about 3 months (around 03/13/2023) for Check Up/Labs .    The patient was given ample opportunity to ask questions and those questions were answered to the patient's satisfaction. The patient was encouraged to be involved in their own care, and all diagnoses, medications, and medication side-effects were discussed. The patient was told to contact me with any additional questions or concerns, or go to the ED in the case of an emergency.       Domnique Vanegas, DO

## 2022-12-12 ENCOUNTER — Ambulatory Visit (HOSPITAL_BASED_OUTPATIENT_CLINIC_OR_DEPARTMENT_OTHER): Payer: Self-pay | Admitting: NEPHROLOGY

## 2022-12-13 ENCOUNTER — Other Ambulatory Visit: Payer: Self-pay

## 2022-12-13 ENCOUNTER — Emergency Department
Admission: EM | Admit: 2022-12-13 | Discharge: 2022-12-13 | Disposition: A | Discharge status: Home / Self Care | Payer: Medicare PPO | Attending: Physician Assistant | Admitting: Physician Assistant

## 2022-12-13 ENCOUNTER — Encounter (HOSPITAL_COMMUNITY): Payer: Self-pay | Admitting: NURSE PRACTITIONER

## 2022-12-13 DIAGNOSIS — T162XXA Foreign body in left ear, initial encounter: Secondary | ICD-10-CM | POA: Insufficient documentation

## 2022-12-13 DIAGNOSIS — W44G1XA Audio device entering into or through a natural orifice, initial encounter: Secondary | ICD-10-CM | POA: Insufficient documentation

## 2022-12-13 MED ORDER — NEOMYCIN-POLYMYXIN-HYDROCORT 3.5 MG/ML-10,000 UNIT/ML-1 % EAR SOLUTION
3.0000 [drp] | OTIC | Status: AC
Start: 2022-12-13 — End: 2022-12-13
  Administered 2022-12-13: 3 [drp] via OTIC
  Filled 2022-12-13: qty 10

## 2022-12-13 NOTE — ED Triage Notes (Signed)
Foreign body left ear. Patient states part of hearing aid is in her ear since yesterday. She has attempted to get it out herself unsuccessfully.

## 2022-12-13 NOTE — ED Nurses Note (Signed)
Patient discharged home with family.  AVS reviewed with patient/care giver.  A written copy of the AVS and discharge instructions was given to the patient/care giver.  Questions sufficiently answered as needed.  Patient/care giver encouraged to follow up with PCP as indicated.  In the event of an emergency, patient/care giver instructed to call 911 or go to the nearest emergency room.  Patient ambulatory out of ED at this time.

## 2022-12-13 NOTE — Discharge Instructions (Addendum)
Discharge home   Use ear antibiotic drops as prescribed.  Three drops left ear 4 times a day for 1 week.  Return to the emergency department for any pain bleeding purulent drainage from the ear decreased hearing or any symptoms of concern

## 2022-12-13 NOTE — ED Provider Notes (Signed)
[facility]  Emergency Department  Provider Note      Joanna Black  1946/04/02  76 y.o.  female  Joanna Black [place] [address]   [phone] (home)  Genene Churn, DO    Chief Complaint:   Chief Complaint   Patient presents with    Foreign Body in Ear       HPI: This is a 76 y.o. female who presents to the emergency department complaining of foreign body in her left ear since last night.  She says last night she went to remove her left hearing aid and the tip of the hearing aid remained in the ear.  She has tried to remove it on her own at home but has been unsuccessful.  She is having some pain and bleeding from the ear.      Past Medical History:   Past Medical History:   Diagnosis Date    Arthritis     Asthma     Back problem     BMI 35.0-35.9,adult 02/16/2020    CAD (coronary artery disease)     mild    Cataract     Cataracts, bilateral     CHF (congestive heart failure) (CMS HCC)     Chronic bronchitis with emphysema     Chronic low back pain     Claustrophobia     Depression     Diabetes (CMS HCC)     Diabetes mellitus, type 2 (CMS HCC)     Encounter for support and coordination of transition of care 11/16/2022    Esophageal reflux     Essential hypertension 01/28/2019    H/O urinary tract infection     HH (hiatus hernia)     History of kidney disease     states, "low kidney functions."    Hypercholesterolemia     Hypertension     Hypertriglyceridemia     Type 2 diabetes mellitus (CMS HCC)        Past Surgical History:   Past Surgical History:   Procedure Laterality Date    Cataract extraction, bilateral Bilateral     Colonoscopy      Esophagogastroduodenoscopy      Hx carpal tunnel release      Hx cataract removal Right 12/30/2019    Hx cataract removal Left 01/06/2020    Hx cholecystectomy      Hx exposure to metal shavings      Hx heart catheterization      Hx hysterectomy      Hx oophorectomy      Hx tah and bso      Hx tubal ligation      Hx vein stripping         Social History:   Social  History     Tobacco Use    Smoking status: Never     Passive exposure: Never    Smokeless tobacco: Never   Vaping Use    Vaping Use: Never used   Substance Use Topics    Alcohol use: No     Alcohol/week: 0.0 standard drinks of alcohol    Drug use: No      Social History     Substance and Sexual Activity   Drug Use No       Allergies:   Allergies   Allergen Reactions    Amoxicillin     Augmentin [Amoxicillin-Pot Clavulanate] Nausea/ Vomiting    Statins-Hmg-Coa Reductase Inhibitors Myalgia       Medications: (Not  in an outpatient encounter)         Review of Systems   Constitutional:  Negative for chills, fever and malaise/fatigue.        Review of systems as below.  Additional systems reviewed in HPI.     HENT:  Negative for congestion, sinus pain, sore throat and tinnitus.    Eyes:  Negative for blurred vision, photophobia, pain and redness.   Respiratory:  Negative for cough, hemoptysis, shortness of breath and wheezing.    Cardiovascular:  Negative for chest pain, palpitations, orthopnea, leg swelling and PND.   Gastrointestinal:  Negative for abdominal pain, blood in stool, diarrhea, heartburn, nausea and vomiting.   Genitourinary:  Negative for dysuria, frequency, hematuria and urgency.   Musculoskeletal:  Negative for back pain, joint pain, myalgias and neck pain.   Skin:  Negative for rash.   Neurological:  Negative for dizziness, sensory change, speech change, focal weakness, weakness and headaches.   Endo/Heme/Allergies:  Negative for environmental allergies. Does not bruise/bleed easily.   Psychiatric/Behavioral:  Negative for depression, hallucinations, memory loss, substance abuse and suicidal ideas.          ED Triage Vitals [12/13/22 1129]   BP (Non-Invasive) (!) 138/58   Heart Rate 67   Respiratory Rate 16   Temperature 36.3 C (97.4 F)   SpO2 97 %   Weight 94.8 kg (209 lb)   Height 1.626 m (5\' 4" )       Physical Exam  HENT:      Head: Normocephalic and atraumatic.      Right Ear: External ear normal.       Ears:      Comments: Clear plastic or rubber foreign body in the left external canal.    After the foreign body was removed the canal is edematous and there is some dried blood.  The TM appears intact and there is no evidence of middle ear infection.     Nose: Nose normal.   Eyes:      Pupils: Pupils are equal, round, and reactive to light.   Cardiovascular:      Rate and Rhythm: Normal rate and regular rhythm.      Heart sounds: Normal heart sounds.   Pulmonary:      Breath sounds: Normal breath sounds.   Abdominal:      General: Bowel sounds are normal. There is no distension.      Palpations: Abdomen is soft.      Tenderness: There is no abdominal tenderness.   Musculoskeletal:         General: No tenderness or deformity. Normal range of motion.      Cervical back: Normal range of motion and neck supple.   Skin:     General: Skin is warm and dry.      Findings: No rash.   Neurological:      Mental Status: She is alert and oriented to person, place, and time.      Cranial Nerves: No cranial nerve deficit.      Deep Tendon Reflexes: Reflexes are normal and symmetric.   Psychiatric:         Mood and Affect: Affect normal.         Cognition and Memory: Memory normal.         Judgment: Judgment normal.           Labs:   No results found for this or any previous visit (from the past 12 hour(s)).  I have reviewed all labs ordered.  See course.    Imaging:  No orders to display       I have seen and reviewed all radiology images ordered. See course.    ED Course/ MDM/ Plan:   Patient was triaged, vital signs were obtained, patient was  placed in a room.  On exam, patient is alert and oriented, nontoxic on exam, and in no acute distress. Vitals were reviewed. Work-up ordered.     Medical Decision Making  Diagnosis is foreign body left external canal.    Problems Addressed:  Foreign body in ear, left, initial encounter: acute illness or injury     Details: Status post intact removal.  There is irritation of the left  external canal.  Patient will be on Cortisporin otic.    Risk  OTC drugs.  Prescription drug management.               Medications Administered in the ED   neomycin-polymyxin-hydrocortisone (CORTISPORIN) 3.5mg -10,000unit-1% otic solution (has no administration in time range)        FB Nose/Ear    Date/Time: 12/13/2022 12:09 PM    Performed by: Eugenio Hoes, PA-C  Authorized by: Eugenio Hoes, PA-C    Consent:     Consent obtained:  Verbal    Risks discussed:  Bleeding, infection, TM perforation, worsening of condition, damage to surrounding structures, incomplete removal and pain    Alternatives discussed:  No treatment  Universal protocol:     Patient identity confirmed:  Verbally with patient  Location:     Location:  Ear    Ear location:  L ear  Pre-procedure details:     Imaging:  None  Sedation:     Sedation type:  None  Anesthesia:     Topical anesthetic:  None  Procedure details:     Localization method:  Direct visualization    Removal mechanism:  Alligator forceps    Procedure complexity:  Simple    Foreign bodies recovered:  1    Description:  Rubber clear end of a hearing aid    Intact foreign body removal: yes    Post-procedure details:     Confirmation:  No additional foreign bodies on visualization    Procedure completion:  Tolerated            Clinical Impression:   Clinical Impression   Foreign body in ear, left, initial encounter (Primary)           Disposition: Discharged  New Prescriptions    No medications on file      Genene Churn, DO  [address]  [place] [address]  [phone]    In 3 days       BP (!) 138/58   Pulse 67   Temp 36.3 C (97.4 F)   Resp 16   Ht 1.626 m (5\' 4" )   Wt 94.8 kg (209 lb)   SpO2 97%   BMI 35.87 kg/m          Tanielle Emigh M Rosaleigh Brazzel, PA-C       This note was partially created using voice recognition software and is inherently subject to errors including those of syntax and "sound alike " substitutions which may escape proof reading.  In such instances,  original meaning may be extrapolated by contextual derivation.

## 2022-12-14 ENCOUNTER — Telehealth (HOSPITAL_COMMUNITY): Payer: Self-pay | Admitting: Family Medicine

## 2022-12-14 NOTE — Progress Notes (Signed)
[facility] ED Follow-Up:   Document completed and/or attempted interactive contact(s) after transition to home after emergency department stay.:   Transition Facility and relevant Date:   Discharge Date: 12/13/22  Discharge from [facility] Department?: Yes  Discharge Facility: [facility]  Contacted by: Corene Cornea, RN  Contact method: Patient/Caregiver Telephone  Contact completed: 12/14/2022 10:15 AM  Was the AVS reviewed with patient?: Yes  How is the patient recovering?: Improving  Medications prescribed: No  Interventions: No needs identified  Son will arrange follow up

## 2022-12-26 ENCOUNTER — Other Ambulatory Visit (INDEPENDENT_AMBULATORY_CARE_PROVIDER_SITE_OTHER): Payer: Self-pay | Admitting: Family Medicine

## 2022-12-28 ENCOUNTER — Other Ambulatory Visit (INDEPENDENT_AMBULATORY_CARE_PROVIDER_SITE_OTHER): Payer: Self-pay | Admitting: Family Medicine

## 2022-12-28 DIAGNOSIS — M62838 Other muscle spasm: Secondary | ICD-10-CM

## 2023-01-06 ENCOUNTER — Ambulatory Visit (INDEPENDENT_AMBULATORY_CARE_PROVIDER_SITE_OTHER): Payer: Self-pay | Admitting: Family Medicine

## 2023-01-14 ENCOUNTER — Telehealth (HOSPITAL_BASED_OUTPATIENT_CLINIC_OR_DEPARTMENT_OTHER): Payer: Self-pay | Admitting: Student in an Organized Health Care Education/Training Program

## 2023-01-14 NOTE — Telephone Encounter (Signed)
R/s'd pt's EUS to:  04/15/2023 .       Instructions reviewed with pt:  yes  Instructions/education mailed again: no - pt has packet  Address confirmed in chart:  Yes  Reminder letter mailed to pt:  No  Office follow up r/s:  yes    Sx sched notified:  Yes - Staff message  Packet moved to new day:  yes  Pt cx on original date in EPIC:  yes  Pt placed on new sched date:  yes    Additional comments:  Pt states she will be in [place] on 03/27/2023.

## 2023-01-17 ENCOUNTER — Ambulatory Visit (INDEPENDENT_AMBULATORY_CARE_PROVIDER_SITE_OTHER): Payer: Self-pay | Admitting: Vascular & Interventional Radiology

## 2023-01-17 ENCOUNTER — Encounter (INDEPENDENT_AMBULATORY_CARE_PROVIDER_SITE_OTHER): Payer: Self-pay | Admitting: Student in an Organized Health Care Education/Training Program

## 2023-01-20 ENCOUNTER — Other Ambulatory Visit (INDEPENDENT_AMBULATORY_CARE_PROVIDER_SITE_OTHER): Payer: Self-pay | Admitting: Family Medicine

## 2023-01-20 DIAGNOSIS — R519 Headache, unspecified: Secondary | ICD-10-CM

## 2023-01-20 MED ORDER — OMEPRAZOLE 20 MG CAPSULE,DELAYED RELEASE
20.0000 mg | DELAYED_RELEASE_CAPSULE | Freq: Every day | ORAL | 1 refills | Status: DC
Start: 2023-01-20 — End: 2023-04-17

## 2023-01-20 MED ORDER — FLUOXETINE 20 MG CAPSULE
20.0000 mg | ORAL_CAPSULE | Freq: Every day | ORAL | 1 refills | Status: DC
Start: 2023-01-20 — End: 2023-04-17

## 2023-01-20 MED ORDER — NAPROXEN 500 MG TABLET
500.0000 mg | ORAL_TABLET | Freq: Two times a day (BID) | ORAL | 3 refills | Status: DC | PRN
Start: 2023-01-20 — End: 2023-05-01

## 2023-01-20 MED ORDER — EZETIMIBE 10 MG TABLET
10.0000 mg | ORAL_TABLET | Freq: Every evening | ORAL | 1 refills | Status: DC
Start: 2023-01-20 — End: 2023-04-17

## 2023-01-23 ENCOUNTER — Other Ambulatory Visit: Payer: Medicare PPO | Attending: Family Medicine

## 2023-01-23 ENCOUNTER — Other Ambulatory Visit: Payer: Self-pay

## 2023-01-23 DIAGNOSIS — E119 Type 2 diabetes mellitus without complications: Secondary | ICD-10-CM

## 2023-01-23 DIAGNOSIS — E1169 Type 2 diabetes mellitus with other specified complication: Secondary | ICD-10-CM | POA: Insufficient documentation

## 2023-01-23 DIAGNOSIS — M353 Polymyalgia rheumatica: Secondary | ICD-10-CM | POA: Insufficient documentation

## 2023-01-23 DIAGNOSIS — E785 Hyperlipidemia, unspecified: Secondary | ICD-10-CM | POA: Insufficient documentation

## 2023-01-23 LAB — COMPREHENSIVE METABOLIC PANEL, NON-FASTING
ALBUMIN: 3.6 g/dL (ref 3.4–4.8)
ALKALINE PHOSPHATASE: 75 U/L (ref 55–145)
ALT (SGPT): 12 U/L (ref 8–22)
ANION GAP: 7 mmol/L (ref 4–13)
AST (SGOT): 15 U/L (ref 8–45)
BILIRUBIN TOTAL: 0.4 mg/dL (ref 0.3–1.3)
BUN/CREA RATIO: 16 (ref 6–22)
BUN: 22 mg/dL (ref 8–25)
CALCIUM: 9.3 mg/dL (ref 8.6–10.3)
CHLORIDE: 105 mmol/L (ref 96–111)
CO2 TOTAL: 25 mmol/L (ref 23–31)
CREATININE: 1.37 mg/dL — ABNORMAL HIGH (ref 0.60–1.05)
ESTIMATED GFR - FEMALE: 40 mL/min/BSA — ABNORMAL LOW (ref >=60)
GLUCOSE: 110 mg/dL (ref 65–125)
POTASSIUM: 5.4 mmol/L — ABNORMAL HIGH (ref 3.5–5.1)
PROTEIN TOTAL: 7.1 g/dL (ref 6.0–8.0)
SODIUM: 137 mmol/L (ref 136–145)

## 2023-01-23 LAB — LIPID PANEL
CHOL/HDL RATIO: 4
CHOLESTEROL: 231 mg/dL — ABNORMAL HIGH (ref 100–200)
HDL CHOL: 58 mg/dL (ref >=50)
LDL CALC: 145 mg/dL — ABNORMAL HIGH (ref <100)
NON-HDL: 173 mg/dL (ref <=190)
TRIGLYCERIDES: 157 mg/dL — ABNORMAL HIGH (ref <150)
VLDL CALC: 29 mg/dL (ref <30)

## 2023-01-23 LAB — URINALYSIS, MACRO/MICRO
BILIRUBIN: NEGATIVE mg/dL
BLOOD: NEGATIVE mg/dL
GLUCOSE: NEGATIVE mg/dL
KETONES: NEGATIVE mg/dL
LEUKOCYTES: NEGATIVE WBCs/uL
NITRITE: NEGATIVE
PH: 6 (ref 4.5–8.0)
PROTEIN: NEGATIVE mg/dL
SPECIFIC GRAVITY: 1.015 (ref 1.001–1.035)
UROBILINOGEN: 0.2 mg/dL (ref 0.2–1.0)

## 2023-01-23 LAB — CBC WITH DIFF
BASOPHIL #: 0.1 10*3/uL (ref 0.00–0.20)
BASOPHIL %: 1 % (ref 0–2)
EOSINOPHIL #: 0.6 10*3/uL (ref 0.00–4.00)
EOSINOPHIL %: 6 % — ABNORMAL HIGH (ref 0–4)
HCT: 35.2 % — ABNORMAL LOW (ref 36.0–47.0)
HGB: 11.9 g/dL — ABNORMAL LOW (ref 12.0–15.0)
LYMPHOCYTE #: 2.8 10*3/uL (ref 1.00–3.80)
LYMPHOCYTE %: 30 % (ref 20–35)
MCH: 31.7 pg (ref 27.0–32.0)
MCHC: 33.9 g/dL (ref 32.0–36.0)
MCV: 93.6 fL (ref 80.0–100.0)
MONOCYTE #: 0.6 10*3/uL (ref 0.00–1.40)
MONOCYTE %: 7 % (ref 3–13)
MPV: 8.8 fL (ref 6.6–9.3)
NEUTROPHIL #: 5.3 10*3/uL (ref 2.40–7.60)
NEUTROPHIL %: 57 % (ref 50–70)
PLATELETS: 272 10*3/uL (ref 140–450)
RBC: 3.76 10*6/uL — ABNORMAL LOW (ref 4.20–5.40)
RDW: 16.4 % — ABNORMAL HIGH (ref 12.0–15.0)
WBC: 9.5 10*3/uL (ref 4.8–10.8)

## 2023-01-23 LAB — HGA1C (HEMOGLOBIN A1C WITH EST AVG GLUCOSE)
ESTIMATED AVERAGE GLUCOSE: 137 mg/dL
HEMOGLOBIN A1C: 6.4 % — ABNORMAL HIGH (ref 4.8–6.2)

## 2023-01-23 LAB — MICROALBUMIN/CREATININE RATIO, URINE, RANDOM
CREATININE RANDOM URINE: 46 mg/dL — ABNORMAL LOW (ref 50–100)
MICROALBUMIN RANDOM URINE: <0.5 mg/dL

## 2023-01-23 LAB — SEDIMENTATION RATE: ERYTHROCYTE SEDIMENTATION RATE (ESR): 15 mm/hr (ref <39)

## 2023-01-23 LAB — C-REACTIVE PROTEIN(CRP),INFLAMMATION: CRP INFLAMMATION: 1.2 mg/L (ref <8.0)

## 2023-01-23 LAB — THYROID STIMULATING HORMONE (SENSITIVE TSH): TSH: 1.321 u[IU]/mL (ref 0.350–4.940)

## 2023-01-23 LAB — MAGNESIUM: MAGNESIUM: 2.2 mg/dL (ref 1.8–2.6)

## 2023-01-23 NOTE — Result Encounter Note (Signed)
Patient notified of lab results and provider notes.  Patient voiced understanding and no other concerns voiced at this time.    Jurline Folger, LPN

## 2023-01-24 ENCOUNTER — Telehealth (INDEPENDENT_AMBULATORY_CARE_PROVIDER_SITE_OTHER): Payer: Self-pay | Admitting: Family Medicine

## 2023-01-24 DIAGNOSIS — N390 Urinary tract infection, site not specified: Secondary | ICD-10-CM

## 2023-01-24 DIAGNOSIS — R300 Dysuria: Secondary | ICD-10-CM

## 2023-01-24 MED ORDER — CIPROFLOXACIN 500 MG TABLET
500.0000 mg | ORAL_TABLET | Freq: Two times a day (BID) | ORAL | 0 refills | Status: AC
Start: 2023-01-24 — End: 2023-01-31

## 2023-01-24 NOTE — Telephone Encounter (Signed)
Please make patient aware order placed for Cipro 500 mg by mouth twice daily for 7 days. In addition, provider placed order for repeat UA.  Thank you.

## 2023-01-24 NOTE — Telephone Encounter (Signed)
Pt said she had UA done couple days ago that was nomal, but yesterday started having some symptoms of burning and pain with urination and some urinary frequency.  Since weekend is coming up, would Dr. Amalia Greenhouse rx her some antibiotic for that.  Pharmacy:  [facility], [address]

## 2023-01-28 ENCOUNTER — Encounter (INDEPENDENT_AMBULATORY_CARE_PROVIDER_SITE_OTHER): Payer: Self-pay | Admitting: Student in an Organized Health Care Education/Training Program

## 2023-01-28 ENCOUNTER — Ambulatory Visit
Payer: Medicare PPO | Attending: Student in an Organized Health Care Education/Training Program | Admitting: Student in an Organized Health Care Education/Training Program

## 2023-01-28 ENCOUNTER — Other Ambulatory Visit: Payer: Self-pay

## 2023-01-28 VITALS — BP 124/82 | HR 73 | Temp 97.6°F | Ht 64.0 in | Wt 214.5 lb

## 2023-01-28 DIAGNOSIS — Z79899 Other long term (current) drug therapy: Secondary | ICD-10-CM | POA: Insufficient documentation

## 2023-01-28 DIAGNOSIS — I2510 Atherosclerotic heart disease of native coronary artery without angina pectoris: Secondary | ICD-10-CM | POA: Insufficient documentation

## 2023-01-28 DIAGNOSIS — Z7952 Long term (current) use of systemic steroids: Secondary | ICD-10-CM | POA: Insufficient documentation

## 2023-01-28 DIAGNOSIS — Z6836 Body mass index (BMI) 36.0-36.9, adult: Secondary | ICD-10-CM | POA: Insufficient documentation

## 2023-01-28 DIAGNOSIS — E669 Obesity, unspecified: Secondary | ICD-10-CM | POA: Insufficient documentation

## 2023-01-28 DIAGNOSIS — M353 Polymyalgia rheumatica: Secondary | ICD-10-CM

## 2023-01-28 DIAGNOSIS — I129 Hypertensive chronic kidney disease with stage 1 through stage 4 chronic kidney disease, or unspecified chronic kidney disease: Secondary | ICD-10-CM | POA: Insufficient documentation

## 2023-01-28 DIAGNOSIS — N183 Chronic kidney disease, stage 3 unspecified: Secondary | ICD-10-CM | POA: Insufficient documentation

## 2023-01-28 DIAGNOSIS — J45909 Unspecified asthma, uncomplicated: Secondary | ICD-10-CM | POA: Insufficient documentation

## 2023-01-28 MED ORDER — PREDNISONE 1 MG TABLET
ORAL_TABLET | ORAL | 0 refills | Status: DC
Start: 2023-01-28 — End: 2023-04-17

## 2023-01-28 NOTE — Progress Notes (Signed)
[facility]  [address]  [name] [facility] [id number]    HPI:  This is a case of a 77 y.o. 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    Medical conditions notable for:  -hospitalized 10/29/22-11/01/22 for acute hypoxic respiratory failure.  That have significant proximal pain and weakness in her shoulders and hips with elevated inflammatory markers.  Discharged on prednisone 50 mg daily.  -obesity BMI 36   -asthma   -coronary artery disease   -CKD stage 3   -hypertension    She was having neck and shoulder pain. She had been getting left sided frontal headaches at that time. No jaw locking or issues with her tongue. She had some blurry vision around that time, but no vision loss. Patient is currently on prednisone 5 mg daily for PMR for around a month. Shoulders are back to her baseline. She has had issues with heartburn before but it is well controlled currently on omeprazole. She has had esophageal dilations in the past.     Denies having any shortness of breath, chest pain, abdominal pain, fevers, unintential weight loss.    All systems reviewed and are otherwise unremarkable unless stated above.    Social Hx- no alcohol use, no smoking, resides in [place]   Family Hx- no family hx of RA or SLE    Current Outpatient Medications   Medication Sig Dispense Refill    acetaminophen (TYLENOL) 500 mg Oral Tablet Take 1 Tablet (500 mg total) by mouth Every night as needed Takes 2 at night      albuterol sulfate (PROVENTIL OR VENTOLIN OR PROAIR) 90 mcg/actuation Inhalation oral inhaler Take 1-2 Puffs by inhalation Every 6 hours as needed 1 Each 3    albuterol sulfate (PROVENTIL) 2.5 mg /3 mL (0.083 %) Inhalation nebulizer solution Take 3 mL (2.5 mg total) by nebulization Every 4 hours as needed for Wheezing 90 Each 3    aspirin (ECOTRIN) 81 mg Oral Tablet, Delayed Release (E.C.) Take 1 Tablet (81 mg total) by mouth Once a day      calcium carbonate/vitamin D3  (VITAMIN D-3 ORAL) Take 2,000 Int'l Units/day by mouth Once a day      ciprofloxacin HCl (CIPRO) 500 mg Oral Tablet Take 1 Tablet (500 mg total) by mouth Twice daily for 7 days 14 Tablet 0    ezetimibe (ZETIA) 10 mg Oral Tablet Take 1 Tablet (10 mg total) by mouth Every evening 90 Tablet 1    FLUoxetine (PROZAC) 20 mg Oral Capsule Take 1 Capsule (20 mg total) by mouth Once a day 90 Capsule 1    metoprolol succinate (TOPROL-XL) 25 mg Oral Tablet Sustained Release 24 hr TAKE 1 TABLET BY MOUTH EVERY DAY AT NIGHT 90 Tablet 1    naproxen (NAPROSYN) 500 mg Oral Tablet Take 1 Tablet (500 mg total) by mouth Twice per day as needed for Pain 60 Tablet 3    omeprazole (PRILOSEC) 20 mg Oral Capsule, Delayed Release(E.C.) Take 1 Capsule (20 mg total) by mouth Once a day 90 Capsule 1    predniSONE (DELTASONE) 1 mg Oral Tablet Take 4 Tablets (4 mg total) by mouth Once a day for 30 days, THEN 3 Tablets (3 mg total) Once a day for 30 days, THEN 2 Tablets (2 mg total) Once a day for 30 days, THEN 1 Tablet (1 mg total) Once a day for 30 days. 300 Tablet 0    predniSONE (DELTASONE) 5 mg Oral  Tablet Take 1 Tablet (5 mg total) by mouth Once a day 90 Tablet 0    tiZANidine (ZANAFLEX) 4 mg Oral Tablet TAKE 1 TABLET BY MOUTH THREE TIMES A DAY AS NEEDED FOR MUSCLE CRAMPS STRENGTH: 4 MG 007 Tablet 1    TRULICITY 6.22 QJ/3.3 mL Subcutaneous Pen Injector INJECT 0.5 ML (0.75 MG) UNDER  THE SKIN EVERY 7 DAYS (Patient taking differently: sundays) 6 mL 3     No current facility-administered medications for this visit.     Allergies   Allergen Reactions    Amoxicillin     Augmentin [Amoxicillin-Pot Clavulanate] Nausea/ Vomiting    Statins-Hmg-Coa Reductase Inhibitors Myalgia     Past Medical History:   Diagnosis Date    Arthritis     Asthma     Back problem     BMI 35.0-35.9,adult 02/16/2020    CAD (coronary artery disease)     mild    Cataract     Cataracts, bilateral     CHF (congestive heart failure) (CMS HCC)     Chronic bronchitis with  emphysema     Chronic low back pain     Claustrophobia     Depression     Diabetes (CMS HCC)     Diabetes mellitus, type 2 (CMS HCC)     Encounter for support and coordination of transition of care 11/16/2022    Esophageal reflux     Essential hypertension 01/28/2019    H/O urinary tract infection     HH (hiatus hernia)     History of kidney disease     states, "low kidney functions."    Hypercholesterolemia     Hypertension     Hypertriglyceridemia     Type 2 diabetes mellitus (CMS HCC)          Past Surgical History:   Procedure Laterality Date    CATARACT EXTRACTION, BILATERAL Bilateral     bilateral lens implant    COLONOSCOPY      ESOPHAGOGASTRODUODENOSCOPY      HX CARPAL TUNNEL RELEASE      HX CATARACT REMOVAL Right 12/30/2019    HX CATARACT REMOVAL Left 01/06/2020    HX CHOLECYSTECTOMY      HX EXPOSURE TO METAL SHAVINGS      HX HEART CATHETERIZATION      HX HYSTERECTOMY      HX OOPHORECTOMY      HX TAH AND BSO      HX TUBAL LIGATION      HX VEIN STRIPPING      Left leg     PHYSICAL EXAM:  VITALS: BP 124/82   Pulse 73   Temp 36.4 C (97.6 F) (Thermal Scan)   Ht 1.626 m (5\' 4" )   Wt 97.3 kg (214 lb 8.1 oz)   SpO2 96%   BMI 36.82 kg/m     General- well appearing woman resting comfortably in chair  Neurologic- no focal deficits noted, speaks in full sentences and answers questions appropriately   HEENT- non traumatic skull, no scleral icterus, normal appearing oral mucosa, no scalp tenderness,  Lungs- normal work of breathing, CTA bilaterally, no wheezes, no rhonchi  Heart- regular rate, regular rhythm, normal S1 and S2, no murmur no carotid bruit noted, normal radial pulses bilaterally  Abdomen- soft, non tender, non distended,   Skin-  no rashes noted on exposed skin, normal appearing finger nails bilaterally   Musculoskeletal- no synovitis, normal range of motion shoulders bilaterally, no tenderness to subacromial bursa,, 5/5  muscle strength in upper and lower extremities  Psych- appropriate mood and  corresponding affect     Labs/imaging:  WBC 9.5, hemoglobin 11.9, platelet count 272   ESR 15 (max value 41) , CRP 1.2 (max value 222 10/2022)  Creatinine 1.37   AST 15, ALT 12, total protein 7.1, albumin 3.6   A1c 6.4    Region BMD  (g/cm) Young Adult  T-score Age Matched  Z-score   AP Spine 1.085 -0.8 0.0   Left Hip total 0.815 -1.5 -0.5   Left Hip Femoral Neck 0.729 -2.2 -0.9     Hip total           Hip Femoral Neck           Forearm           Forearm            Note: T-Scores (Standard deviation from normal young adult mean)  Normal T-Score > -1 BMD within 1 SD of a "young normal" adult   Osteopenia (low bone mass) T-Score = -1.0 to -2.5 BMD is between 1 and 2.5 SD below that of a "young normal" adult   Osteoporosis T-Score < -2.5 BMD is 2.5 SD or more below that of a "young normal" adult      FRAX* Results:      10-Year Probability of Fracture   Major Osteoporotic Fracture  20.6%% Hip Fracture  5.4%%         Assessment and Plan:  77 y.o. 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  Patient's elevated inflammatory markers and significant shoulder pain with limited range of motion is consistent with PMR.  She also had some left frontal headaches which raises concern for giant cell arteritis.  No temporal artery biopsy was obtained.  She has an upcoming appointment with vascular surgery however given she has been on corticosteroids for 3 months there would be limited diagnostic value in a biopsy.  At this time will continue prednisone and plan to taper by 1 mg every 4 weeks.  Patient currently taking 5 mg daily.  She had not had any worsening symptoms with dose reductions of prednisone.  Patient was initially on prednisone 50 mg daily when she is discharged from the hospital.  At the time of her hospitalization in November 2023 she was also having significant shortness of breath and stomach pain which would not be, then GCA or PMR.  Unclear she also had a viral illness at  that time which may have contributed to her elevated inflammatory markers.  She had no thrombocytosis which is commonly seen in GCA.  Plan to follow-up in 3 months.  Plan repeat inflammatory markers at that time or sooner if she develops symptoms.  Patient was counseled about for prednisone taper.  She should increase her dose back to the prior dose for she was asymptomatic if she develops new girdle or shoulder pain/stiffness.  Patient was also extensively counseled about GCA symptoms.  Handouts on GCA and PMR provided.  If unable to taper prednisone then will plan to initiate Actemra.    1. Polymyalgia rheumatica (CMS HCC)  - in remission; CRP normalized  - continue prednisone the last dose being on 02/16.  Begin 4 mg prednisone on 02/17 reduced dose by 1 mg every 4 weeks thereafter.  - predniSONE (DELTASONE) 1 mg Oral Tablet; Take 4 Tablets (4 mg total) by mouth Once a day for 30 days, THEN 3 Tablets (3  mg total) Once a day for 30 days, THEN 2 Tablets (2 mg total) Once a day for 30 days, THEN 1 Tablet (1 mg total) Once a day for 30 days.  Dispense: 300 Tablet; Refill: 0  - C-REACTIVE PROTEIN(CRP),INFLAMMATION; Future  - ESR; Future      Return in about 3 months (around 04/28/2023) for In Person Visit.    Jerelyn Charles, MD

## 2023-02-10 ENCOUNTER — Ambulatory Visit (INDEPENDENT_AMBULATORY_CARE_PROVIDER_SITE_OTHER): Payer: Self-pay | Admitting: Vascular & Interventional Radiology

## 2023-02-10 ENCOUNTER — Encounter (INDEPENDENT_AMBULATORY_CARE_PROVIDER_SITE_OTHER): Payer: Self-pay | Admitting: Vascular & Interventional Radiology

## 2023-02-10 VITALS — BP 118/78 | HR 61 | Temp 97.9°F | Ht 64.0 in | Wt 215.3 lb

## 2023-02-10 DIAGNOSIS — M316 Other giant cell arteritis: Secondary | ICD-10-CM

## 2023-02-10 DIAGNOSIS — N1831 Type 2 diabetes mellitus with diabetic chronic kidney disease (CMS HCC): Secondary | ICD-10-CM

## 2023-02-10 DIAGNOSIS — I5022 Chronic systolic (congestive) heart failure: Secondary | ICD-10-CM

## 2023-02-10 DIAGNOSIS — E782 Mixed hyperlipidemia: Secondary | ICD-10-CM

## 2023-02-10 DIAGNOSIS — I10 Essential (primary) hypertension: Secondary | ICD-10-CM

## 2023-02-10 DIAGNOSIS — I70208 Unspecified atherosclerosis of native arteries of extremities, other extremity: Secondary | ICD-10-CM

## 2023-02-10 DIAGNOSIS — I420 Dilated cardiomyopathy: Secondary | ICD-10-CM

## 2023-02-10 DIAGNOSIS — M315 Giant cell arteritis with polymyalgia rheumatica: Secondary | ICD-10-CM

## 2023-02-10 NOTE — H&P (Unsigned)
[facility]  [id number]  [place] [address]  Phone: [phone]  Fax: [phone]    VASCULAR SURGERY  NOTE      Patient Name:  Joanna Black, Joanna Black  Date of visit:  02/10/2023  Date of Birth:  [birth year]    MRN:  [id number]    PCP: Samella Parr, DO    REASON FOR VISIT:  Suspected general seal artery right    HISTORY OF PRESENT ILLNESS:  77 year old female multiple medical comorbidities including hypertension, hyperlipidemia, type 2 diabetes, coronary artery disease with cardiomyopathy, nonsmoker both sides evaluation and treatment recommendations possible temporal artery biopsy for giant cell arteritis.      6 weeks ago she presented with severe left-sided head a orbital pain and blurred vision.  She had elevated sed rate.  She was started on high dose steroids and a still currently on 50 mg of prednisone a day    She has noticed significant improvement in her symptoms.  He denies any fever.      Denies any symptoms to suggest TIA or stroke.  She has had carpal tunnel surgery in the left hand but has no significant pain in the left upper extremity with use    Past Medical History:   Diagnosis Date    Arthritis     Asthma     Back problem     BMI 35.0-35.9,adult 02/16/2020    CAD (coronary artery disease)     mild    Cataract     Cataracts, bilateral     CHF (congestive heart failure) (CMS HCC)     Chronic bronchitis with emphysema     Chronic low back pain     Claustrophobia     Depression     Diabetes (CMS HCC)     Diabetes mellitus, type 2 (CMS HCC)     Encounter for support and coordination of transition of care 11/16/2022    Esophageal reflux     Essential hypertension 01/28/2019    H/O urinary tract infection     HH (hiatus hernia)     History of kidney disease     states, "low kidney functions."    Hypercholesterolemia     Hypertension     Hypertriglyceridemia     Type 2 diabetes mellitus (CMS HCC)          Past Surgical History:   Procedure Laterality Date    CATARACT  EXTRACTION, BILATERAL Bilateral     bilateral lens implant    COLONOSCOPY      ESOPHAGOGASTRODUODENOSCOPY      HX CARPAL TUNNEL RELEASE      HX CATARACT REMOVAL Right 12/30/2019    HX CATARACT REMOVAL Left 01/06/2020    HX CHOLECYSTECTOMY      HX EXPOSURE TO METAL SHAVINGS      HX HEART CATHETERIZATION      HX HYSTERECTOMY      HX OOPHORECTOMY      HX TAH AND BSO      HX TUBAL LIGATION      HX VEIN STRIPPING      Left leg         Family Medical History:       Problem Relation (Age of Onset)    Bone cancer Father    Diabetes Multiple family members    Hypertension (High Blood Pressure) Multiple family members    No Known Problems Mother    Stomach Cancer Paternal Grandmother  Social History     Socioeconomic History    Marital status: Married     Spouse name: [name]    Number of children: 3    Years of education: 13    Highest education level: High school graduate   Occupational History    Occupation: Retired   Tobacco Use    Smoking status: Never     Passive exposure: Never    Smokeless tobacco: Never   Vaping Use    Vaping status: Never Used   Substance and Sexual Activity    Alcohol use: No     Alcohol/week: 0.0 standard drinks of alcohol    Drug use: No    Sexual activity: Not Currently     Partners: Male   Other Topics Concern    Right hand dominant Yes    Ability to Walk 1 Flight of Steps without SOB/CP No    Ability To Do Own ADL's Yes   Social History Narrative    MARRIED.  Lives in single story home.  Heats home with electric and has well water.        April Haney, LPN  [id number], [id number]     Social Determinants of Health     Financial Resource Strain: Low Risk  (11/04/2022)    Financial Resource Strain     SDOH Financial: No   Transportation Needs: Low Risk  (11/04/2022)    Transportation Needs     SDOH Transportation: No   Social Connections: Medium Risk (11/04/2022)    Social Connections     SDOH Social Isolation: 3 to 5 times a week   Intimate Partner Violence: Low Risk  (11/04/2022)     Intimate Partner Violence     SDOH Domestic Violence: No   Housing Stability: Low Risk  (11/04/2022)    Housing Stability     SDOH Housing Situation: I have housing.     SDOH Housing Worry: No     Review of  Systems:     Constitutional Negative for: Fever, Chills, Weight Loss, Malaise/Fatigue, Diaphoresis, Weakness     Skin Negative for: Rash, Itching, Lesion, Mole, Changing lesion, Growing lesion, Painful lesion, Lesion drainage, Ulcers, Mass     HENT Negative for: Headaches, Hearing Loss, Tinnitus, Ear Pain, Nosebleeds, Congestion, Stridor, Sore Throat, Ear Discharge  Eyes Positive for: Photophobia  Eyes Negative for: Blurred Vision, Double Vision, Eye Pain, Eye Discharge, Eye Redness     Cardiovascular Negative for: Chest Pain, Palpitations, Orthopnea, Claudication, Leg Swelling, PND     Respiratory Negative for: Cough, Hemoptysis, Sputum Production, Shortness of Breath, Wheezing     Gastrointestinal Negative for: Heartburn, Nausea, Vomiting, Abdominal Pain, Diarrhea, Constipation, Blood in Stool, Melena     Genitourinary Negative for: Dysuria, Urgency, Frequency, Hematuria, Flank Pain     Musculoskeletal Negative for: Myalgias, Neck Pain, Joint Pain, Falls     Endo/Heme/Allergy Negative for: Easy Bruise/Bleed, Env Allergies, Polydipsia     Neurological Negative for : Dizziness, Tingling, Tremor, Sensory Change, Speech Change, Focal Weakness, Seizures, LOC     Psychological Negative for: Depression, Suicidal Ideas, Substance Abuse, Hallucinations, Nervoucs/Anxious, Insomnia, Memory Loss                          As noted above    OBJECTIVE:    Vital Signs:  BP 118/78   Pulse 61   Temp 36.6 C (97.9 F)   Ht 1.626 m (5' 4"$ )   Wt 97.7 kg (215 lb  4.8 oz)   SpO2 96%   BMI 36.96 kg/m         Physical exam :      General Exam:    General:  appears in good health, comfortable    Neck:  no thyromegaly or lymphadenopathy  No significant carotid bruit    Lungs:  Breathing nonlabored    Cardiovascular:  regular rate  and rhythm on the monitor    Abdomen:  non-distended    Extremities:  No cyanosis or deformity    Vascular  faint left radial pulse.  Normal right radial pulse.  Otherwise   pulses 2+ throughout    Neurologic: Grossly normal. Alert and oriented x3    Open and surgical wounds:  No    Diagnostic Tests:  Reviewed:  As noted above.  Last sed rate was 40.  BUN creatinine 22/1.37 feb 1    ASSESSMENT:  Possible giant cell arteritis with good response to steroids and no longer symptomatic  Multiple medical comorbidities as noted above  Evidence of reduced arterial perfusion to the left upper extremity though asymptomatic  ENCOUNTER DIAGNOSES     ICD-10-CM   1. Giant cell arteritis (CMS HCC)  M31.6   2. 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 Stage 3a chronic kidney disease (CMS HCC)  N18.31   5. Type 2 diabetes mellitus with stage 3a chronic kidney disease, without long-term current use of insulin (CMS HCC)  E11.22    N18.31   6. Mixed hyperlipidemia  E78.2   7. Essential hypertension  I10   8. Chronic systolic congestive heart failure (CMS HCC)  I50.22   9. Cardiomyopathy, dilated, nonischemic (CMS HCC)  I42.0         PLAN:   Continue medical management of suspected giant cell arteritis.      Biopsy may not be helpful at this time especially after 6 weeks of high-dose steroids.      We will recommend completing the steroid taper and only offer a biopsy if symptoms recur after steroid taper.      Patient has evidence of left upper extremity arterial insufficiency.  Based on the distribution of giant cell arteritis suggests possible that the great vessels of the head and neck may be involved.  I will get a CT angiogram head and neck to further evaluate    Patient is already on anti-platelet medication.  This was continued.  Will re-evaluate the patient again after test results or at the completion of steroid  taper    Problem List:  Patient Active Problem List    Diagnosis Date Noted    Encounter for support and coordination of transition of care 11/16/2022    Enteropathogenic Escherichia coli infection 11/01/2022    Giant cell arteritis with polymyalgia rheumatica (CMS [place]) 10/31/2022    Hypoxia 10/29/2022    History of colon polyps 04/21/2022    Colon cancer screening 04/21/2022    Gastroesophageal reflux disease 04/21/2022    Dysphagia 04/21/2022    Esophageal stricture 04/21/2022    Chronic diarrhea 04/21/2022    Diverticulosis of colon 04/21/2022    CKD (chronic kidney disease) stage 3, 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/min (CMS HCC) 11/29/2021    Mixed hyperlipidemia 04/27/2020    Moderate episode of recurrent major depressive disorder (CMS [place]) 04/27/2020    BMI 35.0-35.9,adult 02/16/2020    Essential hypertension 01/28/2019  Cervical pain (neck) 07/28/2018    Thoracic back pain 07/28/2018    Chronic bilateral low back pain 07/28/2018    Type 2 diabetes mellitus with stage 3a chronic kidney disease, without long-term current use of insulin (CMS HCC)     Cardiomyopathy, dilated, nonischemic (CMS HCC) 03/21/2016    Chronic systolic congestive heart failure (CMS HCC) 03/21/2016    Chronic GERD 02/20/2016       Sheppard Plumber, MD FACS  02/10/2023, 10:30

## 2023-02-11 ENCOUNTER — Other Ambulatory Visit (INDEPENDENT_AMBULATORY_CARE_PROVIDER_SITE_OTHER): Payer: Self-pay | Admitting: Vascular & Interventional Radiology

## 2023-02-11 DIAGNOSIS — M316 Other giant cell arteritis: Secondary | ICD-10-CM

## 2023-02-12 ENCOUNTER — Encounter (INDEPENDENT_AMBULATORY_CARE_PROVIDER_SITE_OTHER): Payer: Self-pay | Admitting: Vascular & Interventional Radiology

## 2023-02-12 NOTE — Nursing Note (Signed)
I called the patient and confirmed with her family member with ultrasound and follow-up appts.    [place]- 04/23/23 12:45pm, 1:30pm  F/U- 11:45pm-telemed      Jimmie Molly, MA  02/12/2023 10:50

## 2023-02-13 ENCOUNTER — Encounter (INDEPENDENT_AMBULATORY_CARE_PROVIDER_SITE_OTHER): Payer: Self-pay | Admitting: Student in an Organized Health Care Education/Training Program

## 2023-02-13 ENCOUNTER — Other Ambulatory Visit (INDEPENDENT_AMBULATORY_CARE_PROVIDER_SITE_OTHER): Payer: Self-pay | Admitting: Student in an Organized Health Care Education/Training Program

## 2023-02-13 DIAGNOSIS — M353 Polymyalgia rheumatica: Secondary | ICD-10-CM

## 2023-02-13 MED ORDER — PREDNISONE 5 MG TABLET
5.0000 mg | ORAL_TABLET | Freq: Every day | ORAL | 0 refills | Status: DC
Start: 2023-02-13 — End: 2023-05-01

## 2023-02-25 ENCOUNTER — Encounter (HOSPITAL_COMMUNITY): Payer: Self-pay | Admitting: Physician Assistant

## 2023-02-25 ENCOUNTER — Ambulatory Visit: Payer: Medicare PPO | Attending: Physician Assistant | Admitting: Physician Assistant

## 2023-02-25 ENCOUNTER — Other Ambulatory Visit: Payer: Self-pay

## 2023-02-25 VITALS — BP 128/66 | HR 64 | Ht 64.0 in | Wt 214.0 lb

## 2023-02-25 DIAGNOSIS — Z789 Other specified health status: Secondary | ICD-10-CM

## 2023-02-25 DIAGNOSIS — I5032 Chronic diastolic (congestive) heart failure: Secondary | ICD-10-CM | POA: Insufficient documentation

## 2023-02-25 DIAGNOSIS — E1122 Type 2 diabetes mellitus with diabetic chronic kidney disease: Secondary | ICD-10-CM | POA: Insufficient documentation

## 2023-02-25 DIAGNOSIS — Z79899 Other long term (current) drug therapy: Secondary | ICD-10-CM | POA: Insufficient documentation

## 2023-02-25 DIAGNOSIS — N183 Chronic kidney disease, stage 3 unspecified: Secondary | ICD-10-CM | POA: Insufficient documentation

## 2023-02-25 DIAGNOSIS — I130 Hypertensive heart and chronic kidney disease with heart failure and stage 1 through stage 4 chronic kidney disease, or unspecified chronic kidney disease: Secondary | ICD-10-CM | POA: Insufficient documentation

## 2023-02-25 DIAGNOSIS — I5030 Unspecified diastolic (congestive) heart failure: Secondary | ICD-10-CM

## 2023-02-25 DIAGNOSIS — I10 Essential (primary) hypertension: Secondary | ICD-10-CM

## 2023-02-25 DIAGNOSIS — E785 Hyperlipidemia, unspecified: Secondary | ICD-10-CM

## 2023-02-25 NOTE — Patient Instructions (Signed)
1.  Continue medications as prescribed.  2.  Follow-up with Cardiology in 6 months, or always sooner if needed.   3.  Please do not hesitate to call with any further questions/concerns.  4.  Always seek immediate medical attention if feeling too poorly for any reason.

## 2023-02-25 NOTE — Progress Notes (Signed)
[facility]  [address]  [place], [place] [id number]  Phone:  [phone]  Fax:  [phone]    CARDIOLOGY FOLLOW-UP    Name: Joanna Black                       Date of Birth: [birth year]   MRN:  [id number]                         Date of visit: 02/25/2023     PCP: Samella Parr, DO     Chief Complaint    Follow Up 6 Months; Hypertension; CHF       Subjective  Joanna Black is a 77 y.o. year old female who presents for Follow Up 6 Months, Hypertension, and CHF   to clinic.  She has a past medical history of GERD, anxiety/depression, hypertension, chronic kidney disease, HFpEF, hyperlipidemia with statin intolerance, and diabetes.  She is also with past medical history of lung disease, polymyalgia rheumatica, and suspected giant cell arteritis.  She was following with vascular surgery, Dr. Kallie Edward for suspected giant cell arteritis.  She denies history of known coronary artery disease.  No history of cardiac stents or bypass.  No history of heart attack.  No history of stroke.  No history of rheumatic fever or scarlet fever.  No history of liver disease.  In regards to her social history, no tobacco use, alcohol use, or illicit drug use.  In regards to her family history she has 2 brothers both with history of coronary artery disease.  One brother has history of heart attack and another brother with history of cardiac stents.  She does have drug allergies to multiple statins causing myalgias.  She has tried multiple statins in the past and cannot tolerate these.  At 1 point she was on pravastatin in September 2015.  Otherwise, she cannot recall the other means of the statins that she has tried.  She also has drug allergies to Augmentin causing nausea and vomiting and amoxicillin.     She notes that she is a history of CHF.  Approximately 6-7 years ago she was in [place].  She notes they would stay there in the winters.  She began having shortness of breath and her oxygen saturation was  low.  She was also with a lot of fluid on board.  She notes she was hospitalized for about 3 days and was treated with medications for breathing improvement and fluid removal.     Patient's most recent cardiac testing includes echocardiogram 10/31/2022.  This noted preserved ejection fraction of 50-55% with mild MR and mild TR.  Patient does have history of stress test performed 01/02/2022.  At that time, she had no stress-induced ischemia.  Ejection fraction calculated at 60 percent. Most recent carotid artery ultrasound performed 12/25/2021.  At that time she had less than 50 percent bilateral carotid artery disease.  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  CTA of the head and neck was ordered.  She notes that she did try to get this done on 2 separate occasions, but her kidney function was too poor for them to do this study.     Patient presents today for routine cardiac follow-up.  Chart review notes that since she last saw [place] on 08/27/2022, she was seen in the ER 09/08/2022.  At that time she presented for shortness of  breath and neck pain.  Troponin was negative.  No acute EKG changes.  She was diagnosed with pulmonary source of her discomfort and was recommended to follow up with her PCP.  She was again seen in the ER 10/29/2022 with complaints of shortness of breath.  Troponin was negative x1.  She was negative for COVID, influenza, and RSV.  She was ultimately admitted and treated for acute hypoxic respiratory failure.  She had extremely elevated ESR/CRP concerning for polymyalgia rheumatica/giant cell arteritis.  She was started on prednisone.  Echocardiogram performed 10/31/2022 within acceptable limits, results as above.  She received a brief course of antibiotics for concerns of pneumonia, however, chest x-ray was clear with no infectious etiology for her hypoxia, and antibiotics were discontinued.  She was found to have encephalopathic E coli with chronic  diarrhea, conservatively managed.  She was found to be hypotensive and her irbesartan and Norvasc were discontinued.  Patient was ultimately discharged 11/01/2022.    Patient presents today for routine six-month cardiac follow-up.  She denies chest pain/discomfort.  She denies fluid retention.  No complaints of worsening shortness of breath to myself today.  Recent lab work as below.  Further review of systems as below.    Recent Lab Results:  Last CBC  (Last result in the past 2 years)        WBC   HGB   HCT   MCV   Platelets      01/23/23 1157 9.5   11.9   35.2   93.6   272            Last BMP  (Last result in the past 2 years)        Na   K   Cl   CO2   BUN   Cr   Calcium   Glucose   Glucose-Fasting        01/23/23 1157 137   5.4   105   25   22   1.37   9.3  Comment: Gadolinium-containing contrast can interfere with calcium measurement.   110               Last Hepatic Panel  (Last result in the past 2 years)        Albumin   Total PTN   Total Bili   Direct Bili   Ast/SGOT   Alt/SGPT   Alk Phos        01/23/23 1157 3.6   7.1   0.4  Comment: Naproxen therapy can falsely elevate total bilirubin levels.     15   12   75             Lab Results   Component Value Date    TSH 1.321 01/23/2023     Lab Results   Component Value Date    TRIG 157 (H) 01/23/2023    HDLCHOL 58 01/23/2023    LDLCHOL 145 (H) 01/23/2023    CHOLESTEROL 231 (H) 01/23/2023     HEMOGLOBIN A1C   Date Value Ref Range Status   01/23/2023 6.4 (H) 4.8 - 6.2 % Final   12/10/2022 7.6  Final   03/13/2022 5.9  Final   11/29/2021 6.3 (H) 4.8 - 6.2 % Final   11/29/2020 6.5 (H) 4.8 - 6.2 % Final     01/23/2023:  ESR:  15.  CRP:  1.2.  Magnesium level:  2.2.    Patient Active Problem List    Diagnosis Date Noted  Encounter for support and coordination of transition of care 11/16/2022     Hospitalization from November 7th through November 01, 2022.       Enteropathogenic Escherichia coli infection 11/01/2022    Giant cell arteritis with polymyalgia rheumatica  (CMS HCC) 10/31/2022    Hypoxia 10/29/2022    History of colon polyps 04/21/2022    Colon cancer screening 04/21/2022    Gastroesophageal reflux disease 04/21/2022    Dysphagia 04/21/2022    Esophageal stricture 04/21/2022    Chronic diarrhea 04/21/2022    Diverticulosis of colon 04/21/2022    CKD (chronic kidney disease) stage 3, 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/min (CMS HCC) 11/29/2021    Mixed hyperlipidemia 04/27/2020     She is willing to try a non-statin RX for her lipids.    Will try Zetia 10 mg daily      Moderate episode of recurrent major depressive disorder (CMS HCC) 04/27/2020    BMI 35.0-35.9,adult 02/16/2020    Essential hypertension 01/28/2019    Cervical pain (neck) 07/28/2018    Thoracic back pain 07/28/2018    Chronic bilateral low back pain 07/28/2018    Type 2 diabetes mellitus with stage 3a chronic kidney disease, without long-term current use of insulin (CMS HCC)     Cardiomyopathy, dilated, nonischemic (CMS HCC) 03/21/2016    Chronic systolic congestive heart failure (CMS HCC) 03/21/2016    Chronic GERD 02/20/2016      Past Medical History:   Diagnosis Date    Arthritis     Asthma     Back problem     BMI 35.0-35.9,adult 02/16/2020    CAD (coronary artery disease)     mild    Cataract     Cataracts, bilateral     CHF (congestive heart failure) (CMS HCC)     Chronic bronchitis with emphysema     Chronic low back pain     Claustrophobia     Depression     Diabetes (CMS HCC)     Diabetes mellitus, type 2 (CMS HCC)     Encounter for support and coordination of transition of care 11/16/2022    Esophageal reflux     Essential hypertension 01/28/2019    H/O urinary tract infection     HH (hiatus hernia)     History of kidney disease     states, "low kidney functions."    Hypercholesterolemia     Hypertension     Hypertriglyceridemia     Type 2 diabetes mellitus (CMS HCC)      Allergies   Allergen Reactions    Amoxicillin     Augmentin [Amoxicillin-Pot Clavulanate] Nausea/ Vomiting    Statins-Hmg-Coa Reductase Inhibitors  Myalgia     MEDICATIONS  Outpatient Medications Prior to Visit   Medication Sig Dispense Refill    acetaminophen (TYLENOL) 500 mg Oral Tablet Take 1 Tablet (500 mg total) by mouth Every night as needed Takes 2 at night      albuterol sulfate (PROVENTIL OR VENTOLIN OR PROAIR) 90 mcg/actuation Inhalation oral inhaler Take 1-2 Puffs by inhalation Every 6 hours as needed 1 Each 3    albuterol sulfate (PROVENTIL) 2.5 mg /3 mL (0.083 %) Inhalation nebulizer solution Take 3 mL (2.5 mg total) by nebulization Every 4 hours as needed for Wheezing 90 Each 3    aspirin (ECOTRIN) 81 mg Oral Tablet, Delayed Release (E.C.) Take 1 Tablet (81 mg total) by mouth Once a day      calcium carbonate/vitamin D3 (VITAMIN D-3 ORAL) Take  2,000 Int'l Units/day by mouth Once a day      ezetimibe (ZETIA) 10 mg Oral Tablet Take 1 Tablet (10 mg total) by mouth Every evening 90 Tablet 1    FLUoxetine (PROZAC) 20 mg Oral Capsule Take 1 Capsule (20 mg total) by mouth Once a day 90 Capsule 1    metoprolol succinate (TOPROL-XL) 25 mg Oral Tablet Sustained Release 24 hr TAKE 1 TABLET BY MOUTH EVERY DAY AT NIGHT 90 Tablet 1    naproxen (NAPROSYN) 500 mg Oral Tablet Take 1 Tablet (500 mg total) by mouth Twice per day as needed for Pain 60 Tablet 3    omeprazole (PRILOSEC) 20 mg Oral Capsule, Delayed Release(E.C.) Take 1 Capsule (20 mg total) by mouth Once a day 90 Capsule 1    predniSONE (DELTASONE) 1 mg Oral Tablet Take 4 Tablets (4 mg total) by mouth Once a day for 30 days, THEN 3 Tablets (3 mg total) Once a day for 30 days, THEN 2 Tablets (2 mg total) Once a day for 30 days, THEN 1 Tablet (1 mg total) Once a day for 30 days. (Patient not taking: Reported on 02/25/2023) 300 Tablet 0    predniSONE (DELTASONE) 5 mg Oral Tablet Take 1 Tablet (5 mg total) by mouth Once a day 60 Tablet 0    tiZANidine (ZANAFLEX) 4 mg Oral Tablet TAKE 1 TABLET BY MOUTH THREE TIMES A DAY AS NEEDED FOR MUSCLE CRAMPS STRENGTH: 4 MG [id number] Tablet 1    TRULICITY [id number] mL  Subcutaneous Pen Injector INJECT 0.5 ML (0.75 MG) UNDER  THE SKIN EVERY 7 DAYS (Patient taking differently: sundays) 6 mL 3     No facility-administered medications prior to visit.     REVIEW OF SYSTEMS:   General: No fever or chills.  HEENT: No vision changes or issues with bleeding from ears, nose, or throat.    Cardiac: No chest pain, tightness, heaviness, or discomfort. No jaw, back, neck, or arm pain. No palpitations.   Resp: No dyspnea at rest or with exertion. No cough, hemoptysis, or paroxysmal nocturnal dyspnea/orthopnea.   GI: No N/V/D, melena, or bright red blood per rectum.  Ext: No lower extremity edema or claudication.    Neuro: No lightheadedness, dizziness, presyncope, syncope, focal weakness, numbness, or tingling.  All other ROS negative    Objective:   BP 128/66   Pulse 64   Ht 1.626 m ('5\' 4"'$ )   Wt 97.1 kg (214 lb)   SpO2 96%   BMI 36.73 kg/m        Physical Exam  Vitals and nursing note reviewed.   Constitutional:       General: She is not in acute distress.     Appearance: Normal appearance. She is not ill-appearing.      Comments: Sitting in bedside chair.   HENT:      Head: Atraumatic.   Eyes:      Conjunctiva/sclera: Conjunctivae normal.   Neck:      Vascular: No carotid bruit.   Cardiovascular:      Rate and Rhythm: Normal rate and regular rhythm.      Heart sounds: Normal heart sounds. No murmur heard.     No friction rub. No gallop.   Pulmonary:      Effort: Pulmonary effort is normal.      Breath sounds: Normal breath sounds. No wheezing, rhonchi or rales.   Musculoskeletal:      Right lower leg: No edema.  Left lower leg: No edema.   Skin:     General: Skin is warm and dry.      Findings: No bruising.   Neurological:      General: No focal deficit present.      Mental Status: She is alert and oriented to person, place, and time.   Psychiatric:         Mood and Affect: Mood normal.        Assessment/Plan  Assessment/Plan   1. Heart failure with preserved ejection fraction,  unspecified HF chronicity (CMS HCC)    2. Hypertension, unspecified type    3. Statin intolerance    4. Hyperlipidemia, unspecified hyperlipidemia type    5. CKD (chronic kidney disease) stage 3, 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/min (CMS HCC)      1. HFpEF.  First diagnosed approximately 6-7 years ago at outside facility in [place], of which she had to receive what sounds like IV diuretic therapy. Echocardiogram 10/31/2022 noted preserved ejection fraction of 50-55% with mild MR and mild TR.     - She currently denies excessive fluid retention or dyspnea. No complaints of PND/PNO. She does not appear overtly fluid overloaded on exam. Lung fields clear to auscultation b/l. I do not appreciate any excessive LE swelling.   - Current GDMT with beta blockade/Toprol XL 25 mg qDay.   - Previously on Arb therapy with irbesartan.  However, this was discontinued during hospitalization in November 2023 due to hypotension.   - Recommend daily weights and 2g sodium restriction diet.     2. Hypertension.   - Toprol XL 25 mg qDay.   - Continue low fat low salt diet.    3. Hyperlipidemia; Statin intolerance.  Statin intolerance. She notes that she has tried multiple statins all which cause myalgias. She is unable to recall exact names. Documented pravastatin 08/2014.   - She is with diabetes. Recommend LDL < 70, HDL > 45, and triglycerides < 150.   - Chart review notes that she has been offered alternative statin therapy, and has deferred in the past.   - Could consider PCSK9 therapy with Repatha or Praluent in the future if patient willing.   - Continue low fat low salt diet.    PLAN  Would continue to work on aggressive risk factor modification.  Patient was encouraged to call with any further questions/concerns.  Patient is to always seek immediate medical attention if feeling too poorly for any reason.  We will continue her current cardiac treatment.    Consider PCSK9 therapy for hyperlipidemia with statin intolerance.    Follow up with Cardiology in 6  months, or always sooner if needed.      Return in about 6 months (around 08/28/2023).       NOTE:  Please note that this Assessment/Plan is Cardiology/Heart & Vascular focused.  Further medical conditions such as CKD, lung disease, anxiety/depression, GERD, hyperlipidemia, type 2 diabetes, polymyalgia rheumatica, and suspected giant cell arteritis are as managed by PCP and other specialists.      NOTE:  This patient was seen independently in clinic by the APP.     NOTE:  A portion of this documentation was generated using MMODAL voice recognition software and may contain syntax/voice recognition errors. Any errors are not intentional. Please call with any questions/concerns.    Wanda Plump, PA-C  [facility]

## 2023-02-26 ENCOUNTER — Other Ambulatory Visit (INDEPENDENT_AMBULATORY_CARE_PROVIDER_SITE_OTHER): Payer: Self-pay | Admitting: Vascular & Interventional Radiology

## 2023-02-26 DIAGNOSIS — M316 Other giant cell arteritis: Secondary | ICD-10-CM

## 2023-03-06 ENCOUNTER — Other Ambulatory Visit (INDEPENDENT_AMBULATORY_CARE_PROVIDER_SITE_OTHER): Payer: Self-pay | Admitting: Family Medicine

## 2023-03-06 DIAGNOSIS — I10 Essential (primary) hypertension: Secondary | ICD-10-CM

## 2023-03-10 ENCOUNTER — Ambulatory Visit (HOSPITAL_COMMUNITY): Payer: Self-pay

## 2023-03-13 ENCOUNTER — Ambulatory Visit (INDEPENDENT_AMBULATORY_CARE_PROVIDER_SITE_OTHER): Payer: Self-pay | Admitting: Family Medicine

## 2023-03-17 ENCOUNTER — Ambulatory Visit (HOSPITAL_COMMUNITY): Payer: Self-pay

## 2023-03-26 ENCOUNTER — Telehealth (INDEPENDENT_AMBULATORY_CARE_PROVIDER_SITE_OTHER): Payer: Self-pay

## 2023-04-14 ENCOUNTER — Encounter (HOSPITAL_COMMUNITY): Payer: Self-pay | Admitting: Gastroenterology

## 2023-04-15 ENCOUNTER — Inpatient Hospital Stay
Admission: RE | Admit: 2023-04-15 | Discharge: 2023-04-15 | Disposition: A | Discharge status: Home / Self Care | Payer: Medicare PPO | Source: Ambulatory Visit | Attending: Gastroenterology | Admitting: Gastroenterology

## 2023-04-15 ENCOUNTER — Ambulatory Visit (HOSPITAL_COMMUNITY): Payer: Medicare PPO | Admitting: Certified Registered"

## 2023-04-15 ENCOUNTER — Other Ambulatory Visit: Payer: Self-pay

## 2023-04-15 ENCOUNTER — Encounter (HOSPITAL_COMMUNITY): Payer: Self-pay | Admitting: Gastroenterology

## 2023-04-15 ENCOUNTER — Ambulatory Visit (HOSPITAL_BASED_OUTPATIENT_CLINIC_OR_DEPARTMENT_OTHER): Payer: Medicare PPO | Admitting: Certified Registered"

## 2023-04-15 ENCOUNTER — Encounter (HOSPITAL_COMMUNITY)
Admission: RE | Disposition: A | Discharge status: Home / Self Care | Payer: Self-pay | Source: Ambulatory Visit | Attending: Gastroenterology

## 2023-04-15 DIAGNOSIS — D132 Benign neoplasm of duodenum: Secondary | ICD-10-CM

## 2023-04-15 DIAGNOSIS — M549 Dorsalgia, unspecified: Secondary | ICD-10-CM | POA: Insufficient documentation

## 2023-04-15 DIAGNOSIS — Z79899 Other long term (current) drug therapy: Secondary | ICD-10-CM | POA: Insufficient documentation

## 2023-04-15 DIAGNOSIS — E669 Obesity, unspecified: Secondary | ICD-10-CM | POA: Insufficient documentation

## 2023-04-15 DIAGNOSIS — I509 Heart failure, unspecified: Secondary | ICD-10-CM | POA: Insufficient documentation

## 2023-04-15 DIAGNOSIS — F329 Major depressive disorder, single episode, unspecified: Secondary | ICD-10-CM | POA: Insufficient documentation

## 2023-04-15 DIAGNOSIS — J449 Chronic obstructive pulmonary disease, unspecified: Secondary | ICD-10-CM | POA: Insufficient documentation

## 2023-04-15 DIAGNOSIS — Z794 Long term (current) use of insulin: Secondary | ICD-10-CM | POA: Insufficient documentation

## 2023-04-15 DIAGNOSIS — E785 Hyperlipidemia, unspecified: Secondary | ICD-10-CM | POA: Insufficient documentation

## 2023-04-15 DIAGNOSIS — K219 Gastro-esophageal reflux disease without esophagitis: Secondary | ICD-10-CM | POA: Insufficient documentation

## 2023-04-15 DIAGNOSIS — I2510 Atherosclerotic heart disease of native coronary artery without angina pectoris: Secondary | ICD-10-CM | POA: Insufficient documentation

## 2023-04-15 DIAGNOSIS — Z6837 Body mass index (BMI) 37.0-37.9, adult: Secondary | ICD-10-CM | POA: Insufficient documentation

## 2023-04-15 DIAGNOSIS — E119 Type 2 diabetes mellitus without complications: Secondary | ICD-10-CM | POA: Insufficient documentation

## 2023-04-15 DIAGNOSIS — Z7901 Long term (current) use of anticoagulants: Secondary | ICD-10-CM | POA: Insufficient documentation

## 2023-04-15 DIAGNOSIS — I110 Hypertensive heart disease with heart failure: Secondary | ICD-10-CM | POA: Insufficient documentation

## 2023-04-15 HISTORY — PX: ENDOSCOPIC ULTRASOUND ESOPHGEAL: WVUENDOPRO33

## 2023-04-15 LAB — POC BLOOD GLUCOSE (RESULTS): GLUCOSE, POC: 115 mg/dl — ABNORMAL HIGH (ref 70–110)

## 2023-04-15 SURGERY — ENDOSCOPIC U/S UPPER
Anesthesia: General | Wound class: Clean Contaminated Wounds-The respiratory, GI, Genital, or urinary

## 2023-04-15 MED ORDER — LACTATED RINGERS INTRAVENOUS SOLUTION
INTRAVENOUS | Status: DC
Start: 2023-04-15 — End: 2023-04-15

## 2023-04-15 MED ORDER — ALBUTEROL SULFATE HFA 90 MCG/ACTUATION AEROSOL INHALATION - EAST
INHALATION_SPRAY | Freq: Once | RESPIRATORY_TRACT | Status: DC | PRN
Start: 2023-04-15 — End: 2023-04-15
  Administered 2023-04-15: 4 via RESPIRATORY_TRACT

## 2023-04-15 MED ORDER — ONDANSETRON HCL (PF) 4 MG/2 ML INJECTION SOLUTION
INTRAMUSCULAR | Status: AC
Start: 2023-04-15 — End: 2023-04-15
  Filled 2023-04-15: qty 2

## 2023-04-15 MED ORDER — LIDOCAINE (PF) 100 MG/5 ML (2 %) INTRAVENOUS SYRINGE
INJECTION | Freq: Once | INTRAVENOUS | Status: DC | PRN
Start: 2023-04-15 — End: 2023-04-15
  Administered 2023-04-15: 100 mg via INTRAVENOUS

## 2023-04-15 MED ORDER — SIMETHICONE 40 MG/0.6 ML ORAL DROPS,SUSPENSION
Freq: Once | ORAL | Status: DC | PRN
Start: 2023-04-15 — End: 2023-04-15
  Administered 2023-04-15: 40 mg

## 2023-04-15 MED ORDER — PROPOFOL 10 MG/ML IV BOLUS
INJECTION | Freq: Once | INTRAVENOUS | Status: DC | PRN
Start: 2023-04-15 — End: 2023-04-15
  Administered 2023-04-15: 150 mg via INTRAVENOUS

## 2023-04-15 MED ORDER — ALBUTEROL SULFATE HFA 90 MCG/ACTUATION AEROSOL INHALATION - EAST
INHALATION_SPRAY | RESPIRATORY_TRACT | Status: AC
Start: 2023-04-15 — End: 2023-04-15
  Filled 2023-04-15: qty 8.5

## 2023-04-15 MED ORDER — ONDANSETRON HCL (PF) 4 MG/2 ML INJECTION SOLUTION
Freq: Once | INTRAMUSCULAR | Status: DC | PRN
Start: 2023-04-15 — End: 2023-04-15
  Administered 2023-04-15: 8 mg via INTRAVENOUS

## 2023-04-15 MED ORDER — SODIUM CHLORIDE 0.9 % (FLUSH) INJECTION SYRINGE
3.0000 mL | INJECTION | INTRAMUSCULAR | Status: DC | PRN
Start: 2023-04-15 — End: 2023-04-15
  Administered 2023-04-15: 3 mL

## 2023-04-15 MED ORDER — PROPOFOL 10 MG/ML IV BOLUS
INJECTION | INTRAVENOUS | Status: AC
Start: 2023-04-15 — End: 2023-04-15
  Filled 2023-04-15: qty 100

## 2023-04-15 SURGICAL SUPPLY — 27 items
BLOCK BITE 60FR ADULT STRAP FLXB SH LF  DISP (ENDOSCOPIC SUPPLIES) ×1 IMPLANT
BRUSH CYTO 1.5MM 140CM BLT TIP ERG HNDL ATO STP SHEATH CLBR STRL DISP PTFE BRONCHSCP (ENDOSCOPIC SUPPLIES) IMPLANT
CATH ELHMST INJ GLD PROBE 7FR 25GA .24MM 210CM BIPOLAR RND DIST TIP STD CONN INTGR DISP 2.8MM MN WRK (ENDOSCOPIC SUPPLIES) IMPLANT
CLIP HMST MR CONDITIONAL BRD CATH ROT CONTROL KNOB NO SHEATH RSL 360 235CM 2.8MM 11MM OPN (ENDOSCOPIC SUPPLIES) IMPLANT
DEVICE BIOPSY ACQUIRE S FRANSEEN 22GA US FN NEEDLE TAPER PNT CLIP STRL (ENDOSCOPIC SUPPLIES) ×1 IMPLANT
DISC USE 161419 BALLOON ENDOS ORCL OLMPS TEAR_RST ELAS GRIP STRL LF (ENDOSCOPIC SUPPLIES) IMPLANT
DISCONTINUED USE ITEM 328361 - JELLY LUB EZ BCTRST H2O SOL NG_RS FLPTP TUBE STRL 2OZ LF (MED SURG SUPPLIES) IMPLANT
FORCEPS BIOPSY HOT 240CM 2.2MM RJ 4 +2.8MM DISP (ENDOSCOPIC SUPPLIES)
FORCEPS BIOPSY HOT 240CM 2.2MM RJ 4 +2.8MM DISPO (ENDOSCOPIC SUPPLIES) IMPLANT
FORCEPS BIOPSY LRG CPC NEEDLE 240CM 2.2MM RJ 3 DISP ORNG (ENDOSCOPIC SUPPLIES) IMPLANT
FORCEPS BIOPSY NEEDLE 160CM 1.8MM RJ 4 DISP YW 2MM WRK CHNL GSPED (MED SURG SUPPLIES) IMPLANT
FORCEPS BIOPSY NEEDLE 240CM RJ 4 JMB (ENDOSCOPIC SUPPLIES) IMPLANT
GW ENDOSCOPIC .035IN 260CM DREAMWIRE ANG RX EGLD NITINOL BIL STRL DISP (ENDOSCOPIC SUPPLIES) IMPLANT
JELLY LUB EZ BCTRST H2O SOL NG_RS FLPTP TUBE STRL 2OZ LF (MED SURG SUPPLIES)
KIT RM TURNOVER STPC DISP (DRAPE/PACKS/SHEETS/OR TOWEL) ×1 IMPLANT
LIGATOR 2.8MM 8.6-11.5MM SSS7 ESOPH 1 STNG MLT BAND HNDL STRL DISP ENDOS HMSTS LF (ENDOSCOPIC SUPPLIES) IMPLANT
NEEDLE ENDOS 8- CM 19GA 1.73MM NITINOL 2.8MM BVL 1 WY STOPCOCK ASP HNDL EXP SLIMLINE 20ML (ENDOSCOPIC SUPPLIES) IMPLANT
NEEDLE ENDOS 8- CM 22GA 1.65MM NITINOL 2.4MM BVL 1 WY STOPCOCK ASP HNDL EXP SLIMLINE 20ML (ENDOSCOPIC SUPPLIES) IMPLANT
NEEDLE ENDOS 8- CM 25GA 1.52MM NITINOL 2.4MM BVL 1 WY STOPCOCK ASP HNDL EXP SLIMLINE 20ML (ENDOSCOPIC SUPPLIES) IMPLANT
NEEDLE SCLRTX 25GA 2.3MM BVL STRL DISP STAR CATH INTJCT 4MM 240CM (ENDOSCOPIC SUPPLIES) IMPLANT
NET SPEC RETR 230CM 2.5MM RTHNT STD SHEATH 6X3CM NONST LF  DISP (ENDOSCOPIC SUPPLIES) IMPLANT
PROBE ESURG 220CM 2.3MM FIAPC FLXB STR FIRE STRL DISP (SURGICAL CUTTING SUPPLIES) IMPLANT
SNARE 230CM 2.5MM 3CM PLPK ROTR RTHNT ENDOS NONST LF  DISP (ENDOSCOPIC SUPPLIES) IMPLANT
SNARE MED OVAL 240CM 2.4MM CAPTIVATR STF ENDOS PLYP 27MM DISP (ENDOSCOPIC SUPPLIES) IMPLANT
SOCK CAN MEDIVAC ASCP SPECI CNVRT NONST LF (MED SURG SUPPLIES) IMPLANT
STENT WALLFLEX 25-30MM 10FR 90MM 230CM COLON DEL SYS NITINOL 270CM STRL ACPT .035IN GW (STENTS GASTROINTESTINAL)
TRAY GASTRIC LAV 36IN 34FR ARGYLE EDLICH MONOJECT LRG PVC 4 EYE CLS END GRAD SYRG TRNSPR 140CC NONST (MED SURG SUPPLIES) IMPLANT

## 2023-04-15 NOTE — Anesthesia Preprocedure Evaluation (Addendum)
ANESTHESIA PRE-OP EVALUATION  Planned Procedure: ENDOSCOPIC U/S UPPER  Review of Systems     anesthesia history negative     patient summary reviewed          Pulmonary   COPD, moderate, asthma, shortness of breath and rescue inhaler,  orthopnea ,  Cardiovascular    Hypertension, poorly controlled, CAD, CHF, DOE, ECG reviewed,     Nonischemic cardiomyopathy    EKG  Normal sinus rhythm  Anteroseptal infarct , age undetermined  Abnormal ECG  No previous ECGs available  Confirmed by Gregary Signs ([id number]) on 12/01/2021 12:06:35 AM    ECHO  The left ventricle is small. Concentric remodeling. The left ventricular ejection fraction by visual assessment is estimated to be 55-60%.  Left ventricular systolic function is normal. No segmental/regional wall motion abnormalities identified. Left ventricular diastolic  parameters are normal.  Normal right ventricular size. Normal right ventricular systolic function. Right ventricular systolic pressure is normal.  No significant valvular heart disease.    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  No Facial hair  No Beard        Dental           (+) partials, missing             Pulmonary      (+) decreased breath sounds present        Cardiovascular    Rhythm: regular  Rate: Normal  (-) no friction rub, carotid bruit is not  present, no peripheral edema and no murmur     Other findings              Plan  ASA 3     Planned anesthesia type: general     general intravenous and total intravenous anesthesia      PONV Plan:  I plan to administer pharmcologic prophalaxis antiemetics              Intravenous induction     Anesthesia issues/risks discussed are: PONV, Dental Injuries, Intraoperative Awareness/ Recall, Aspiration, Sore Throat, Cardiac Events/MI, Difficult Airway, Stroke, Blood Loss, Eye /Visual Loss, Post-op Intubation/Ventilation, Post-op Pain Management, Post-op Agitation/Tantrum, Nerve Injuries and Post-op Cognitive Dysfunction.  Anesthetic plan and risks discussed with patient  signed consent obtained      Use of blood products discussed with patient who consented to blood products.      Patient's NPO status is appropriate for Anesthesia.           Plan discussed with CRNA and physician.

## 2023-04-15 NOTE — Anesthesia Postprocedure Evaluation (Signed)
Anesthesia Post Op Evaluation    Patient: Joanna Black  Procedure(s) with comments:  ENDOSCOPIC U/S UPPER with FNA - diabetic    Last Vitals:Temperature: 37 C (98.6 F) (04/15/23 1050)  Heart Rate: 78 (04/15/23 1050)  BP (Non-Invasive): 134/77 (04/15/23 1050)  Respiratory Rate: (!) 10 (04/15/23 1050)  SpO2: 100 % (04/15/23 1050)    No notable events documented.    Patient is sufficiently recovered from the effects of anesthesia to participate in the evaluation and has returned to their pre-procedure level.  Patient location during evaluation: PACU       Patient participation: complete - patient participated  Level of consciousness: awake and alert and responsive to verbal stimuli    Pain management: adequate  Airway patency: patent    Anesthetic complications: no  Cardiovascular status: acceptable  Respiratory status: acceptable  Hydration status: acceptable  Patient post-procedure temperature: Pt Normothermic   PONV Status: Absent

## 2023-04-15 NOTE — H&P (Signed)
[facility]  Operating/Endoscopy Suite H&P/Update    Date:   04/15/2023  Name: Joanna Black  Age: 77 y.o.    Referring Provider:    Edwin Cap, DO  [address]  [address]  [place],  [place] [address]    Primary Care Provider:    Genene Churn, DO    Chief Complaint: submucosal lesion duodenum      HPI:    On the day of endoscopy, there is no acute emesis.  On the day of endoscopy, there is no acute mental status changes.      Review of Systems:  General:  Denies fevers/chills  Cardio:  Denies CP/palpitations  Respiratory:  Denies acute changes in SOB/DOE    Past Medical History    Current Facility-Administered Medications:     LR premix infusion, , Intravenous, Continuous, Edwin Cap, DO, Last Rate: 25 mL/hr at 04/15/23 0936, New Bag at 04/15/23 0936    NS flush syringe, 3 mL, Intracatheter, Q1H PRN, Edwin Cap, DO, 3 mL at 04/15/23 8119  Allergies   Allergen Reactions    Amoxicillin  Other Adverse Reaction (Add comment)     Unknown reaction    Augmentin [Amoxicillin-Pot Clavulanate] Nausea/ Vomiting    Statins-Hmg-Coa Reductase Inhibitors Myalgia     Past Medical History:   Diagnosis Date    Arthritis     Asthma     Back problem     BMI 35.0-35.9,adult 02/16/2020    CAD (coronary artery disease)     mild    Cataract     Cataracts, bilateral     CHF (congestive heart failure) (CMS HCC)     Chronic bronchitis with emphysema (CMS HCC)     Chronic low back pain     Claustrophobia     Depression     Diabetes (CMS HCC)     Diabetes mellitus, type 2 (CMS HCC)     Encounter for support and coordination of transition of care 11/16/2022    Esophageal reflux     Essential hypertension 01/28/2019    H/O urinary tract infection     HH (hiatus hernia)     History of kidney disease     states, "low kidney functions."    Hypercholesterolemia     Hypertension     Hypertriglyceridemia     Type 2 diabetes mellitus (CMS HCC)          Past Surgical History:   Procedure Laterality Date     CATARACT EXTRACTION, BILATERAL Bilateral     bilateral lens implant    COLONOSCOPY      ESOPHAGOGASTRODUODENOSCOPY      HX CARPAL TUNNEL RELEASE      HX CATARACT REMOVAL Right 12/30/2019    HX CATARACT REMOVAL Left 01/06/2020    HX CHOLECYSTECTOMY      HX EXPOSURE TO METAL SHAVINGS      HX HEART CATHETERIZATION      HX HYSTERECTOMY      HX OOPHORECTOMY      HX TAH AND BSO      HX TUBAL LIGATION      HX VEIN STRIPPING      Left leg         Family Medical History:       Problem Relation (Age of Onset)    Bone cancer Father    Diabetes Multiple family members    Hypertension (High Blood Pressure) Multiple family members    No Known Problems Mother  Stomach Cancer Paternal Grandmother            Social History     Socioeconomic History    Marital status: Married     Spouse name: Duke Salvia    Number of children: 3    Years of education: 13    Highest education level: High school graduate   Occupational History    Occupation: Retired   Tobacco Use    Smoking status: Never     Passive exposure: Never    Smokeless tobacco: Never   Vaping Use    Vaping status: Never Used   Substance and Sexual Activity    Alcohol use: No     Alcohol/week: 0.0 standard drinks of alcohol    Drug use: No    Sexual activity: Not Currently     Partners: Male   Other Topics Concern    Right hand dominant Yes    Ability to Walk 1 Flight of Steps without SOB/CP No    Ability To Do Own ADL's Yes   Social History Narrative    MARRIED.  Lives in single story home.  Heats home with electric and has well water.        April Haney, LPN  0/98/1191, 14:01     Social Determinants of Health     Financial Resource Strain: Low Risk  (11/04/2022)    Financial Resource Strain     SDOH Financial: No   Transportation Needs: Low Risk  (11/04/2022)    Transportation Needs     SDOH Transportation: No   Social Connections: Medium Risk (11/04/2022)    Social Connections     SDOH Social Isolation: 3 to 5 times a week   Intimate Partner Violence: Low Risk  (11/04/2022)     Intimate Partner Violence     SDOH Domestic Violence: No   Housing Stability: Low Risk  (11/04/2022)    Housing Stability     SDOH Housing Situation: I have housing.     SDOH Housing Worry: No       Examination:  BP (!) 136/59   Pulse 64   Temp 36.3 C (97.3 F)   Resp 18   Ht 1.626 m ( )   Wt 98.4 kg (216 lb 14.9 oz)   SpO2 99%   BMI 37.24 kg/m        Wt Readings from Last 2 Encounters:   04/15/23 98.4 kg (216 lb 14.9 oz)   02/25/23 97.1 kg (214 lb)     General: no distress  Lungs: Thoracic excursion normal.  No audible stridor.  Cardiovascular: peripheral pulses are present  Abdomen: Soft, non-tender, non-distended  Extremities: No cyanosis or edema  Neurologic: Grossly normal      Assessment  MALANIE KOLOSKI is a 77 y.o. female with a chief complaint of submucosal lesion duodenum    Plan  Endoscopic Ultrasound - Upper    No contraindications to planned surgery

## 2023-04-15 NOTE — Anesthesia Transfer of Care (Signed)
ANESTHESIA TRANSFER OF CARE   Joanna Black is a 77 y.o. ,female, Weight: 98.4 kg (216 lb 14.9 oz)   had Procedure(s) with comments:  ENDOSCOPIC U/S UPPER with FNA - diabetic  performed  04/15/23   Primary Service: Edwin Cap, DO    Past Medical History:   Diagnosis Date    Arthritis     Asthma     Back problem     BMI 35.0-35.9,adult 02/16/2020    CAD (coronary artery disease)     mild    Cataract     Cataracts, bilateral     CHF (congestive heart failure) (CMS HCC)     Chronic bronchitis with emphysema (CMS HCC)     Chronic low back pain     Claustrophobia     Depression     Diabetes (CMS HCC)     Diabetes mellitus, type 2 (CMS HCC)     Encounter for support and coordination of transition of care 11/16/2022    Esophageal reflux     Essential hypertension 01/28/2019    H/O urinary tract infection     HH (hiatus hernia)     History of kidney disease     states, "low kidney functions."    Hypercholesterolemia     Hypertension     Hypertriglyceridemia     Type 2 diabetes mellitus (CMS HCC)       Allergy History as of 04/15/23       AMOXICILLIN-POT CLAVULANATE         Noted Status Severity Type Reaction    06/19/18 1117 Lewanda Rife 06/19/18 Active Low  Nausea/ Vomiting              STATINS-HMG-COA REDUCTASE INHIBITORS         Noted Status Severity Type Reaction    06/23/18 1201 Shelly Flatten, LPN 16/10/96 Active Low  Myalgia    06/19/18 1117 Lewanda Rife 06/19/18 Active   Myalgia              AMOXICILLIN         Noted Status Severity Type Reaction    04/15/23 0916 Octaviano Glow, RN 02/23/12 Active    Other Adverse Reaction (Add comment)    Comments: Unknown reaction     01/28/19 1621 Shelly Flatten, LPN 04/54/09 Active                     I completed my transfer of care / handoff to the receiving personnel during which we discussed:  Access, Airway, All key/critical aspects of case discussed, Analgesia, Antibiotics, Expectation of post procedure, Fluids/Product, Gave opportunity for questions and  acknowledgement of understanding, Labs and PMHx                                                                   Last OR Temp: Temperature: 37 C (98.6 F)  ABG:  POTASSIUM   Date Value Ref Range Status   01/23/2023 5.4 (H) 3.5 - 5.1 mmol/L Final     KETONES   Date Value Ref Range Status   01/23/2023 Negative Negative mg/dL Final     CALCIUM   Date Value Ref Range Status   01/23/2023 9.3 8.6 - 10.3 mg/dL Final  Comment:     Gadolinium-containing contrast can interfere with calcium measurement.     Calculated P Axis   Date Value Ref Range Status   10/30/2022 53 degrees Corrected     Calculated R Axis   Date Value Ref Range Status   10/30/2022 -39 degrees Corrected     Calculated T Axis   Date Value Ref Range Status   10/30/2022 -47 degrees Corrected     CAMPYLOBACTER   Date Value Ref Range Status   10/31/2022 Not Detected Not Detected Final     Airway:* No LDAs found *  Blood pressure 134/77, pulse 78, temperature 37 C (98.6 F), resp. rate (!) 10, height 1.626 m ( ), weight 98.4 kg (216 lb 14.9 oz), SpO2 100%, not currently breastfeeding.

## 2023-04-16 NOTE — Progress Notes (Signed)
FAMILY MEDICINE, [facility] BUILD[facility]  [address] [place] [address]  Operated by [facility]  Medicare Annual Wellness Visit    Name: Joanna Black MRN:  [id number]   Date: 04/17/2023 Age: 77 y.o.       SUBJECTIVE:   Joanna Black is a 77 y.o. female for presenting for Medicare Wellness exam.   I have reviewed and reconciled the medication list with the patient today.    Comprehensive Health Assessment:  Paper document COMPREHENSIVE HEALTH ASSESSMENT reviewed and scanned into medical record    I have reviewed and updated as appropriate the past medical, family and social history. 04/17/2023 as summarized below:  Past Medical History:   Diagnosis Date    Arthritis     Asthma     Back problem     BMI 35.0-35.9,adult 02/16/2020    CAD (coronary artery disease)     mild    Cataract     Cataracts, bilateral     CHF (congestive heart failure) (CMS HCC)     Chronic bronchitis with emphysema (CMS HCC)     Chronic low back pain     Claustrophobia     Depression     Diabetes (CMS HCC)     Diabetes mellitus, type 2 (CMS HCC)     Encounter for support and coordination of transition of care 11/16/2022    Esophageal reflux     Essential hypertension 01/28/2019    H/O urinary tract infection     HH (hiatus hernia)     History of kidney disease     states, "low kidney functions."    Hypercholesterolemia     Hypertension     Hypertriglyceridemia     Type 2 diabetes mellitus (CMS HCC)      Past Surgical History:   Procedure Laterality Date    Cataract extraction, bilateral Bilateral     Colonoscopy      Esophagogastroduodenoscopy      Hx carpal tunnel release      Hx cataract removal Right 12/30/2019    Hx cataract removal Left 01/06/2020    Hx cholecystectomy      Hx exposure to metal shavings      Hx heart catheterization      Hx hysterectomy      Hx oophorectomy      Hx tah and bso      Hx tubal ligation      Hx vein stripping       Current Outpatient Medications   Medication Sig    acetaminophen (TYLENOL)  500 mg Oral Tablet Take 1 Tablet (500 mg total) by mouth Every night as needed Takes 2 at night    albuterol sulfate (PROVENTIL OR VENTOLIN OR PROAIR) 90 mcg/actuation Inhalation oral inhaler Take 1-2 Puffs by inhalation Every 6 hours as needed    albuterol sulfate (PROVENTIL) 2.5 mg /3 mL (0.083 %) Inhalation nebulizer solution Take 3 mL (2.5 mg total) by nebulization Every 4 hours as needed for Wheezing    aspirin (ECOTRIN) 81 mg Oral Tablet, Delayed Release (E.C.) Take 1 Tablet (81 mg total) by mouth Once a day    calcium carbonate/vitamin D3 (VITAMIN D-3 ORAL) Take 2,000 Int'l Units/day by mouth Once a day    ezetimibe (ZETIA) 10 mg Oral Tablet Take 1 Tablet (10 mg total) by mouth Every evening    FLUoxetine (PROZAC) 20 mg Oral Capsule Take 1 Capsule (20 mg total) by mouth Once a day  furosemide (LASIX) 20 mg Oral Tablet Take 1 Tablet (20 mg total) by mouth Twice per day as needed    metoprolol succinate (TOPROL-XL) 25 mg Oral Tablet Sustained Release 24 hr Take 1 Tablet (25 mg total) by mouth Every night    naproxen (NAPROSYN) 500 mg Oral Tablet Take 1 Tablet (500 mg total) by mouth Twice per day as needed for Pain    omeprazole (PRILOSEC) 20 mg Oral Capsule, Delayed Release(E.C.) Take 1 Capsule (20 mg total) by mouth Once a day    predniSONE (DELTASONE) 5 mg Oral Tablet Take 1 Tablet (5 mg total) by mouth Once a day    tiZANidine (ZANAFLEX) 4 mg Oral Tablet TAKE 1 TABLET BY MOUTH THREE TIMES A DAY AS NEEDED FOR MUSCLE CRAMPS STRENGTH: 4 MG    TRULICITY 0.75 mg/0.5 mL Subcutaneous Pen Injector INJECT 0.5 ML (0.75 MG) UNDER  THE SKIN EVERY 7 DAYS (Patient taking differently: sundays)     Family Medical History:       Problem Relation (Age of Onset)    Bone cancer Father    Diabetes Multiple family members    Hypertension (High Blood Pressure) Multiple family members    No Known Problems Mother    Stomach Cancer Paternal Grandmother            Social History     Socioeconomic History    Marital status: Married      Spouse name: Duke Salvia    Number of children: 3    Years of education: 13    Highest education level: High school graduate   Occupational History    Occupation: Retired   Tobacco Use    Smoking status: Never     Passive exposure: Never    Smokeless tobacco: Never   Vaping Use    Vaping status: Never Used   Substance and Sexual Activity    Alcohol use: No     Alcohol/week: 0.0 standard drinks of alcohol    Drug use: No    Sexual activity: Not Currently     Partners: Male   Social History Narrative    MARRIED.  Lives in single story home.  Heats home with electric and has well water.        April Haney, LPN  01/10/1477, 14:01     Social Determinants of Health     Financial Resource Strain: Low Risk  (04/17/2023)    Financial Resource Strain     SDOH Financial: No   Transportation Needs: Low Risk  (04/17/2023)    Transportation Needs     SDOH Transportation: No   Social Connections: Low Risk  (04/17/2023)    Social Connections     SDOH Social Isolation: 5 or more times a week   Intimate Partner Violence: Low Risk  (04/17/2023)    Intimate Partner Violence     SDOH Domestic Violence: No   Housing Stability: Low Risk  (04/17/2023)    Housing Stability     SDOH Housing Situation: I have housing.     SDOH Housing Worry: No   Health Literacy: Medium Risk (10/29/2022)    Health Literacy     SDOH Health Literacy: Occasionally   Employment Status: Low Risk  (04/17/2023)    Employment Status     SDOH Employment: Otherwise unemployed but not seeking work (ex. Consulting civil engineer, retired, disabled, unpaid primary care giver)         List of Current Health Care Providers   Care Team  PCP       Name Type Specialty Phone Number    Genene Churn, DO Physician [facility] [phone]              Care Team       No care team found                      Health Maintenance   Topic Date Due    Hepatitis C screening  Never done    Adult Tdap-Td (1 - Tdap) Never done    Shingles Vaccine (2 of 3) 12/16/2013    Diabetic Retinal Exam  10/25/2020     Covid-19 Vaccine (5 - 2023-24 season) 08/23/2022    Diabetic A1C  07/24/2023    Diabetic Kidney Health eGFR  01/24/2024    Diabetic Kidney Health Microalb/Cr Ratio  01/24/2024    Osteoporosis screening  12/05/2030    Influenza Vaccine  Completed    Medicare Annual Wellness Visit - Calendar Year Insurers  Completed    Pneumococcal Vaccination, Age 3+  Completed    Meningococcal Vaccine  Aged Out     Medicare Wellness Assessment   Medicare initial or wellness physical in the last year?: Yes  Advance Directives   Does patient have a living will or MPOA: No           Advance directive information given to the patient today?: Patient Declined      Activities of Daily Living   Do you need help with dressing, bathing, or walking?: No   Do you need help with shopping, housekeeping, medications, or finances?: No   Do you have rugs in hallways, broken steps, or poor lighting?: No   Do you have grab bars in your bathroom, non-slip strips in your tub, and hand rails on your stairs?: Yes   Cognitive Function Screen (1=Yes, 0=No)   What is you age?: Correct   What is the time to the nearest hour?: Correct   What is the year?: Correct   What is the name of this clinic?: Correct   Can the patient recognize two persons (the doctor, the nurse, home help, etc.)?: Correct   What is the date of your birth? (day and month sufficient) : Correct   In what year did World War II end?: Incorrect   Who is the current president of the [place]?: Correct   Count from 20 down to 1?: Correct   What address did I give you earlier?: Incorrect   Total Score: 8   Interpretation of Total Score: Greater than 6 Normal   Fall Risk Screen   Do you feel unsteady when standing or walking?: Yes  Do you worry about falling?: Yes  Have you fallen in the past year?: Yes  How many times have you fallen?: 2 or more times  Were you ever injured from falling?: Yes   Depression Screen     Little interest or pleasure in doing things.: Not at all  Feeling down,  depressed, or hopeless: Not at all  PHQ 2 Total: 0  Trouble falling or staying asleep, or sleeping too much.: Nearly every day  Feeling tired or having little energy: Several Days  Poor appetite or overeating: Not at all  Feeling bad about yourself/ that you are a failure in the past 2 weeks?: Not at all  Trouble concentrating on things in the past 2 weeks?: Several Days  Moving/Speaking slowly or being fidgety or restless  in the  past 2 weeks?: Not at all  Thoughts that you would be better off DEAD, or of hurting yourself in some way.: Not at all  PHQ 9 Total: 5  Interpretation of Total Score: 5-9 Mild depression     Pain Score   Pain Score:   0 - No pain    Substance Use-Abuse Screening     Tobacco Use     In Past 12 MONTHS, how often have you used any tobacco product (for example, cigarettes, e-cigarettes, cigars, pipes, or smokeless tobacco)?: Never     Alcohol use     In the PAST 12 MONTHS, how often have you had 5 (men)/4 (women) or more drinks containing alcohol in one day?: Never     Prescription Drug Use     In the PAST 12 months, how often have you used any prescription medications just for the feeling, more than prescribed, or that were not prescribed for you? Prescriptions may include: opioids, benzodiazepines, medications for ADHD: Never           Illicit Drug Use   In the PAST 12 MONTHS, how often have you used any drugs, including marijuana, cocaine or crack, heroin, methamphetamine, hallucinogens, ecstasy/MDMA?: Never            Urine Incontinence Screen   Urinary Incontinence Screen  Do you ever leak urine when you don't want to?: YES     OBJECTIVE:   BP 126/76 (Site: Left, Patient Position: Sitting, Cuff Size: Adult)   Pulse 85   Temp 36.1 C (96.9 F) (Temporal)   Resp 18   Ht 1.626 m (5\' 4" )   Wt 99 kg (218 lb 3.2 oz)   SpO2 95%   BMI 37.45 kg/m        Physical Exam  Vitals reviewed.   Constitutional:       General: She is not in acute distress.     Appearance: Normal appearance. She is  obese. She is not ill-appearing, toxic-appearing or diaphoretic.   HENT:      Head: Normocephalic and atraumatic.      Nose: No congestion or rhinorrhea.   Eyes:      General: No scleral icterus.        Right eye: No discharge.         Left eye: No discharge.      Conjunctiva/sclera: Conjunctivae normal.   Cardiovascular:      Rate and Rhythm: Normal rate and regular rhythm.      Pulses: Normal pulses.   Pulmonary:      Effort: Pulmonary effort is normal. No respiratory distress.      Breath sounds: No wheezing, rhonchi or rales.   Musculoskeletal:         General: No swelling or tenderness. Normal range of motion.      Cervical back: Normal range of motion.      Right lower leg: No edema.      Left lower leg: No edema.   Skin:     General: Skin is warm and dry.      Coloration: Skin is not jaundiced or pale.      Findings: No bruising, erythema, lesion or rash.   Neurological:      General: No focal deficit present.      Mental Status: She is alert. Mental status is at baseline.      Cranial Nerves: No cranial nerve deficit.      Motor: No weakness.  Gait: Gait normal.   Psychiatric:         Mood and Affect: Mood normal.         Behavior: Behavior normal.         Thought Content: Thought content normal.         Judgment: Judgment normal.         Health Maintenance Due   Topic Date Due    Hepatitis C screening  Never done    Adult Tdap-Td (1 - Tdap) Never done    Shingles Vaccine (2 of 3) 12/16/2013    Diabetic Retinal Exam  10/25/2020    Covid-19 Vaccine (5 - 2023-24 season) 08/23/2022        ASSESSMENT & PLAN:     1. Encounter for Medicare annual wellness exam  - Medicare Wellness Template Complete   - Health Risk Assessment Performed and Reviewed  - Advanced Directives Discussed and Reviewed with patient   - Health Maintenance Forecast given to the patient within instruction section     2. Type 2 diabetes mellitus (CMS HCC)  Diabetes Monitors  A1C: 6.4  A1C Date: 01/23/2023  Kidney Health:   Urine  Microalbumin/Cr Ratio--12.3 Ur Microalb/Cr Ratio date--01/23/2023   eGFR --40 eGFR date--01/23/2023    Last Lipid Panel  (Last result in the past 2 years)        Cholesterol   HDL   LDL   Direct LDL   Triglycerides      01/23/23 1157 231   58   145  Comment: <100 mg/dL, Optimal  782-956 mg/dL, Near/Above Optimal  213-086 mg/dL, Borderline High  578-469 mg/dL, High  >=629 mg/dL, Very high     528            Retinal Exam Date: 10/26/2019 retinopathy status not documented  Last Foot Exam: 03/13/2022  Last A1C well controlled at 6.4  Continue Trulicity 0.75 mg under the skin once every 7 days    3. 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 once daily along with Toprol-XL 25 mg p.o. nightly, Lasix 20 mg p.o. b.i.d. p.r.n.  - metoprolol succinate (TOPROL-XL) 25 mg Oral Tablet Sustained Release 24 hr; Take 1 Tablet (25 mg total) by mouth Every night  Dispense: 90 Tablet; Refill: 1  - furosemide (LASIX) 20 mg Oral Tablet; Take 1 Tablet (20 mg total) by mouth Twice per day as needed  Dispense: 60 Tablet; Refill: 0    4. Hyperlipidemia associated with type 2 diabetes mellitus (CMS HCC)  (CMS [facility])  Lab Results   Component Value Date    CHOLESTEROL 231 (H) 01/23/2023    HDLCHOL 58 01/23/2023    LDLCHOL 145 (H) 01/23/2023    TRIG 157 (H) 01/23/2023   Continue Zetia 10 mg p.o. once every evening  - ezetimibe (ZETIA) 10 mg Oral Tablet; Take 1 Tablet (10 mg total) by mouth Every evening  Dispense: 90 Tablet; Refill: 1    6. 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 once daily  Will continue to follow closely    7.  GERD (gastroesophageal  reflux disease)  Stable  - omeprazole (PRILOSEC) 20 mg Oral Capsule, Delayed Release(E.C.); Take 1 Capsule (20 mg total) by mouth Once a day  Dispense: 90 Capsule; Refill: 1    8. 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 Written Prevention Plan completed/ updated and given to patient (see After Visit Summary).  Medication reconciliation was performed during today's encounter.     Return in about 3 months (around 07/17/2023) for Check Up/Labs .      Genene Churn, DO  FAMILY MEDICINE, [facility] BUILDING  [address] [place] [address]  Phone: [phone]  Fax: [phone]

## 2023-04-17 ENCOUNTER — Other Ambulatory Visit: Payer: Self-pay

## 2023-04-17 ENCOUNTER — Ambulatory Visit (HOSPITAL_BASED_OUTPATIENT_CLINIC_OR_DEPARTMENT_OTHER): Payer: Self-pay | Admitting: Student in an Organized Health Care Education/Training Program

## 2023-04-17 ENCOUNTER — Encounter (INDEPENDENT_AMBULATORY_CARE_PROVIDER_SITE_OTHER): Payer: Self-pay | Admitting: Family Medicine

## 2023-04-17 ENCOUNTER — Ambulatory Visit: Payer: Medicare PPO | Attending: Family Medicine | Admitting: Family Medicine

## 2023-04-17 VITALS — BP 126/76 | HR 85 | Temp 96.9°F | Resp 18 | Ht 64.0 in | Wt 218.2 lb

## 2023-04-17 DIAGNOSIS — M315 Giant cell arteritis with polymyalgia rheumatica: Secondary | ICD-10-CM

## 2023-04-17 DIAGNOSIS — Z0000 Encounter for general adult medical examination without abnormal findings: Secondary | ICD-10-CM | POA: Insufficient documentation

## 2023-04-17 DIAGNOSIS — M353 Polymyalgia rheumatica: Secondary | ICD-10-CM | POA: Insufficient documentation

## 2023-04-17 DIAGNOSIS — E119 Type 2 diabetes mellitus without complications: Secondary | ICD-10-CM

## 2023-04-17 DIAGNOSIS — I152 Hypertension secondary to endocrine disorders: Secondary | ICD-10-CM | POA: Insufficient documentation

## 2023-04-17 DIAGNOSIS — E1169 Type 2 diabetes mellitus with other specified complication: Secondary | ICD-10-CM | POA: Insufficient documentation

## 2023-04-17 DIAGNOSIS — E1122 Type 2 diabetes mellitus with diabetic chronic kidney disease: Secondary | ICD-10-CM | POA: Insufficient documentation

## 2023-04-17 DIAGNOSIS — F325 Major depressive disorder, single episode, in full remission: Secondary | ICD-10-CM | POA: Insufficient documentation

## 2023-04-17 DIAGNOSIS — Z604 Social exclusion and rejection: Secondary | ICD-10-CM | POA: Insufficient documentation

## 2023-04-17 DIAGNOSIS — N183 Chronic kidney disease, stage 3 unspecified: Secondary | ICD-10-CM | POA: Insufficient documentation

## 2023-04-17 DIAGNOSIS — E785 Hyperlipidemia, unspecified: Secondary | ICD-10-CM | POA: Insufficient documentation

## 2023-04-17 DIAGNOSIS — Z6837 Body mass index (BMI) 37.0-37.9, adult: Secondary | ICD-10-CM | POA: Insufficient documentation

## 2023-04-17 DIAGNOSIS — Z7985 Long-term (current) use of injectable non-insulin antidiabetic drugs: Secondary | ICD-10-CM | POA: Insufficient documentation

## 2023-04-17 DIAGNOSIS — E1159 Type 2 diabetes mellitus with other circulatory complications: Secondary | ICD-10-CM | POA: Insufficient documentation

## 2023-04-17 DIAGNOSIS — K219 Gastro-esophageal reflux disease without esophagitis: Secondary | ICD-10-CM | POA: Insufficient documentation

## 2023-04-17 MED ORDER — EZETIMIBE 10 MG TABLET
10.0000 mg | ORAL_TABLET | Freq: Every evening | ORAL | 1 refills | Status: DC
Start: 2023-04-17 — End: 2023-10-28

## 2023-04-17 MED ORDER — FLUOXETINE 20 MG CAPSULE
20.0000 mg | ORAL_CAPSULE | Freq: Every day | ORAL | 1 refills | Status: DC
Start: 2023-04-17 — End: 2023-09-09

## 2023-04-17 MED ORDER — FUROSEMIDE 20 MG TABLET
20.0000 mg | ORAL_TABLET | Freq: Two times a day (BID) | ORAL | 0 refills | Status: DC | PRN
Start: 2023-04-17 — End: 2023-07-03

## 2023-04-17 MED ORDER — OMEPRAZOLE 20 MG CAPSULE,DELAYED RELEASE
20.0000 mg | DELAYED_RELEASE_CAPSULE | Freq: Every day | ORAL | 1 refills | Status: DC
Start: 2023-04-17 — End: 2023-10-28

## 2023-04-17 MED ORDER — METOPROLOL SUCCINATE ER 25 MG TABLET,EXTENDED RELEASE 24 HR
25.0000 mg | ORAL_TABLET | Freq: Every evening | ORAL | 1 refills | Status: DC
Start: 2023-04-17 — End: 2023-10-28

## 2023-04-17 NOTE — Nursing Note (Signed)
04/17/23 1425   PHQ 9 (follow up)   Little interest or pleasure in doing things. 0   Feeling down, depressed, or hopeless 0   PHQ 2 Total 0   Trouble falling or staying asleep, or sleeping too much. 1   Feeling tired or having little energy 1   Poor appetite or overeating 0   Feeling bad about yourself/ that you are a failure in the past 2 weeks? 0   Trouble concentrating on things in the past 2 weeks? 1   Moving/Speaking slowly or being fidgety or restless  in the past 2 weeks? 0   Thoughts that you would be better off DEAD, or of hurting yourself in some way. 0   If you checked off any problems, how difficult have these problems made it for you to do your work, take care of things at home, or get along with other people? Not difficult at all   PHQ 9 Total 3   Interpretation of Total Score No depression

## 2023-04-17 NOTE — Nursing Note (Signed)
04/17/23 1426   Domestic Violence   Because we are aware of abuse and domestic violence today, we ask all patients: Are you being hurt, hit, or frightened by anyone at your home or in your life?  N   Basic Needs   Do you have any basic needs within your home that are not being met? (such as Food, Shelter, Civil [employer] fast streamer, Tranportation, paying for bills and/or medications) N

## 2023-04-17 NOTE — Nursing Note (Signed)
04/17/23 1429   Medicare Wellness Assessment   Medicare initial or wellness physical in the last year? Yes   Advance Directives   Does patient have a living will or MPOA No   Advance directive information given to the patient today? Patient Declined   Activities of Daily Living   Do you need help with dressing, bathing, or walking? No   Do you need help with shopping, housekeeping, medications, or finances? No   Do you have rugs in hallways, broken steps, or poor lighting? No   Do you have grab bars in your bathroom, non-slip strips in your tub, and hand rails on your stairs? Yes   Cognitive Function Screen   What is you age? 1   What is the time to the nearest hour? 1   What is the year? 1   What is the name of this clinic? 1   Can the patient recognize two persons (the doctor, the nurse, home help, etc.)? 1   What is the date of your birth? (day and month sufficient)  1   In what year did World War II end? 0   Who is the current president of the [place]? 1   Count from 20 down to 1? 1   What address did I give you earlier? 0   Total Score 8   Interpretation of Total Score Greater than 6 Normal   Depression Screen   Little interest or pleasure in doing things. 0   Feeling down, depressed, or hopeless 0   PHQ 2 Total 0   Trouble falling or staying asleep, or sleeping too much. 3   Feeling tired or having little energy 1   Poor appetite or overeating 0   Feeling bad about yourself/ that you are a failure in the past 2 weeks? 0   Trouble concentrating on things in the past 2 weeks? 1   Moving/Speaking slowly or being fidgety or restless  in the past 2 weeks? 0   Thoughts that you would be better off DEAD, or of hurting yourself in some way. 0   If you checked off any problems, how difficult have these problems made it for you to do your work, take care of things at home, or get along with other people? Not difficult at all   PHQ 9 Total 5   Interpretation of Total Score Mild depression   Pain Score   Pain  Score Zero   Substance Use Screening   In Past 12 MONTHS, how often have you used any tobacco product (for example, cigarettes, e-cigarettes, cigars, pipes, or smokeless tobacco)? Never   In the PAST 12 MONTHS, how often have you had 5 (men)/4 (women) or more drinks containing alcohol in one day? Never   In the PAST 12 months, how often have you used any prescription medications just for the feeling, more than prescribed, or that were not prescribed for you? Prescriptions may include: opioids, benzodiazepines, medications for ADHD Never   In the PAST 12 MONTHS, how often have you used any drugs, including marijuana, cocaine or crack, heroin, methamphetamine, hallucinogens, ecstasy/MDMA? Never   Fall Risk Assessment   Do you feel unsteady when standing or walking? Yes   Do you worry about falling? Yes   Have you fallen in the past year? Yes   How many times have you fallen? 2 or more times   Were you ever injured from falling? Yes   Urinary Incontinence Screen   Do you  ever leak urine when you don't want to? YES

## 2023-04-17 NOTE — Patient Instructions (Signed)
Medicare Preventive Services  Medicare coverage information Recommendation for YOU   Heart Disease and Diabetes   Lipid profile Every 5 years or more often if at risk for cardiovascular disease     Lab Results   Component Value Date    CHOLESTEROL 231 (H) 01/23/2023    HDLCHOL 58 01/23/2023    LDLCHOL 145 (H) 01/23/2023    TRIG 157 (H) 01/23/2023         Diabetes Screening    Yearly for those at risk for diabetes, 2 tests per year for those with prediabetes Last Glucose:      Diabetes Self Management Training or Medical Nutrition Therapy  For those with diabetes, up to 10 hrs initial training within a year, subsequent years up to 2 hrs of follow up training Optional for those with diabetes     Medical Nutrition Therapy  Three hours of one-on-one counseling in first year, two hours in subsequent years Optional for those with diabetes, kidney disease   Intensive Behavioral Therapy for Obesity  Face-to-face counseling, first month every week, month 2-6 every other week, month 7-12 every month if continued progress is documented Optional for those with Body Mass Index 30 or higher  Your Body mass index is 37.45 kg/m.   Tobacco Cessation (Quitting) Counseling   Covers up to 8 smoking and tobacco-use cessation counseling sessions in a 66-month period.    Optional for those that use tobacco   Cancer Screening Last Completion Date   Colorectal screening   For anyone age 15 to 105 or any age if high risk:  Screening Colonoscopy every 10 yrs if low risk,  more frequent if higher risk  OR  Cologuard Stool DNA test once every 3 years OR  Fecal Occult Blood Testing yearly OR  Flexible  Sigmoidoscopy  every 5 yr OR  CT Colonography every 5 yrs      See below for due date if applicable.   Screening Pap Test   Recommended every 3 years for all women age 19 to 36, or every five years if combined with HPV test (routine screening not needed after total hysterectomy).  Medicare covers every 2 years or yearly if high risk.  Screening  Pelvic Exam   Medicare covers every 2 years, yearly if high risk or childbearing age with abnormal Pap in last 3 yrs.     See below for due date if applicable.   Screening Mammogram   Recommended every 2 years for women age 50 to 52, or more frequent if you have a higher risk. Selectively recommended for women between 40-49 based on shared decisions about risk. Covered by Medicare up to every year for women age 69 or older   See below for due date if applicable.         Lung Cancer Screening  Annual low dose computed tomography (LDCT scan) is recommended for those age 26-80 who smoked 20 pack-years and are current smokers or quit smoking within past 15 years, after counseling by your doctor or nurse clinician about the possible benefits or harms.     See below for due date if applicable.   Vaccinations   Respiratory syncytial virus (RSV)  Age 70 years or older: Based on shared clinical decision-making with your provider.  Pneumococcal Vaccine  Recommended routinely age 36+ with one or two separate vaccines based on your risk. Recommended before age 31 if medical conditions with increased risk  Seasonal Influenza Vaccine  Once every flu season  Hepatitis B Vaccine  3 doses if risk (including anyone with diabetes or liver disease)  Shingles Vaccine  Two doses at age 36 or older  Diphtheria Tetanus Pertussis Vaccine  ONCE as adult, booster every 10 years     Immunization History   Administered Date(s) Administered    Covid-19 Vaccine,Moderna,12 Years+ 01/20/2020, 02/17/2020, 11/01/2020    Covid-19 Vaccine,Pfizer Bivalent,69mcg/0.3ml,12 yrs+ 11/14/2021    FLUZONE HD VACCINE (ADMIN) 09/25/2020    High-Dose Influenza Vaccine, 65+ 11/25/2017, 10/26/2018, 09/25/2020, 11/14/2021    INFLUENZA VIRUS VACCINE (ADMIN) 10/20/2015    Influenza Vaccine, 6 month-adult 09/09/2008    Influenza Vaccine, 65+ 09/07/2019, 10/03/2022    PREVNAR 13 01/06/2013    Pneumovax 01/06/2014    ZOSTAVAX (VARICELLA ZOSTER VACCINE) 10/21/2013     Zoster Family 10/21/2013     Shingles vaccine and Diphtheria Tetanus Pertussis vaccines are available at pharmacies or local health department without a prescription.   Other Preventative Screening  Last Completion Date   Bone Densitometry   Screening: All females ages 35 and older every 10 years if initial screening normal. Postmenopausal women ages 76-64 need screening with one or more risk factor: previous fracture, parental hip fracture, current smoker, low body weight, excessive alcohol use, Rheumatoid Arthritis   For women with diagnosed Osteoporosis, follow up is recommended every 2 years or a frequency recommended by your provider.     --12/05/2020  See below for due date if applicable.     Glaucoma Screening   Yearly if in high risk group such as diabetes, family history, African American age 84+ or Hispanic American age 72+   See your eye care provider for screening.   Hepatitis C Screening   Recommended  for those born between ages 18-79 years.     See below for due date if applicable.     HIV Testing  Recommended routinely at least ONCE, covered every year for age 63 to 5 regardless of risk, and every year for age over 13 who ask for the test or higher risk. Yearly or up to 3 times in pregnancy         See below for due date if applicable.   Abdominal Aortic Aneurysm Screening Ultrasound   Once with a family history of abdominal aortic aneurysms OR a female between 14-75 and have smoked at least 100 cigarettes in your lifetime.         See below for due date if applicable.       Your Personalized Schedule for Preventive Tests     Health Maintenance: Pending and Last Completed         Date Due Completion Date    Hepatitis C screening Never done ---    Adult Tdap-Td (1 - Tdap) Never done ---    Shingles Vaccine (2 of 3) 12/16/2013 10/21/2013    Diabetic Retinal Exam 10/25/2020 10/26/2019    Covid-19 Vaccine (5 - 2023-24 season) 08/23/2022 11/14/2021    Diabetic A1C 07/24/2023 01/23/2023    Diabetic Kidney  Health eGFR 01/24/2024 01/23/2023    Diabetic Kidney Health Microalb/Cr Ratio 01/24/2024 01/23/2023    Osteoporosis screening 12/05/2030 12/05/2020                  For Information on Advanced Directives for Health Care:  [place]:  [email or web address]  [place], [place], [place], [place] General Information: [email or web address]

## 2023-04-17 NOTE — Nursing Note (Signed)
04/17/23 1428   Fall Risk Assessment   Do you feel unsteady when standing or walking? Yes   Do you worry about falling? Yes   Have you fallen in the past year? Yes   How many times have you fallen? 2 or more times   Were you ever injured from falling? No

## 2023-04-23 ENCOUNTER — Other Ambulatory Visit (HOSPITAL_COMMUNITY): Payer: Medicare PPO

## 2023-04-23 ENCOUNTER — Other Ambulatory Visit (INDEPENDENT_AMBULATORY_CARE_PROVIDER_SITE_OTHER): Payer: Self-pay | Admitting: Primary Care

## 2023-04-23 ENCOUNTER — Inpatient Hospital Stay
Admission: RE | Admit: 2023-04-23 | Discharge: 2023-04-23 | Disposition: A | Discharge status: Home / Self Care | Payer: Medicare PPO | Source: Ambulatory Visit | Attending: Primary Care | Admitting: Primary Care

## 2023-04-23 ENCOUNTER — Other Ambulatory Visit: Payer: Self-pay

## 2023-04-23 DIAGNOSIS — M316 Other giant cell arteritis: Secondary | ICD-10-CM | POA: Insufficient documentation

## 2023-04-23 LAB — CYTOPATHOLOGY, FINE NEEDLE ASPIRATE

## 2023-04-23 NOTE — Result Encounter Note (Signed)
Resulted reviewed.  Will discuss with patient at their follow up appointment.

## 2023-04-25 ENCOUNTER — Telehealth (INDEPENDENT_AMBULATORY_CARE_PROVIDER_SITE_OTHER): Payer: Self-pay | Admitting: Family Medicine

## 2023-04-25 DIAGNOSIS — R32 Unspecified urinary incontinence: Secondary | ICD-10-CM

## 2023-04-25 DIAGNOSIS — G8929 Other chronic pain: Secondary | ICD-10-CM

## 2023-04-25 NOTE — Telephone Encounter (Signed)
Order has been placed for requested referral. Thank you.

## 2023-04-25 NOTE — Telephone Encounter (Signed)
Zenaya would like to know if you could send a referral to urology Dr. Lyman Speller in [facility] thank you

## 2023-04-27 ENCOUNTER — Other Ambulatory Visit (HOSPITAL_COMMUNITY): Payer: Self-pay | Admitting: NURSE PRACTITIONER

## 2023-04-27 ENCOUNTER — Other Ambulatory Visit
Admission: RE | Admit: 2023-04-27 | Discharge: 2023-04-27 | Disposition: A | Discharge status: Home / Self Care | Payer: Medicare PPO | Source: Ambulatory Visit | Attending: NURSE PRACTITIONER | Admitting: NURSE PRACTITIONER

## 2023-04-27 DIAGNOSIS — N390 Urinary tract infection, site not specified: Secondary | ICD-10-CM

## 2023-04-29 ENCOUNTER — Ambulatory Visit (INDEPENDENT_AMBULATORY_CARE_PROVIDER_SITE_OTHER): Payer: Medicare PPO | Admitting: Student in an Organized Health Care Education/Training Program

## 2023-05-01 ENCOUNTER — Other Ambulatory Visit (HOSPITAL_COMMUNITY): Payer: Medicare PPO

## 2023-05-01 ENCOUNTER — Other Ambulatory Visit: Payer: Self-pay

## 2023-05-01 ENCOUNTER — Encounter (INDEPENDENT_AMBULATORY_CARE_PROVIDER_SITE_OTHER): Payer: Self-pay | Admitting: Student in an Organized Health Care Education/Training Program

## 2023-05-01 ENCOUNTER — Inpatient Hospital Stay (INDEPENDENT_AMBULATORY_CARE_PROVIDER_SITE_OTHER)
Admission: RE | Admit: 2023-05-01 | Discharge: 2023-05-01 | Disposition: A | Discharge status: Home / Self Care | Payer: Medicare PPO | Source: Ambulatory Visit

## 2023-05-01 ENCOUNTER — Encounter (HOSPITAL_BASED_OUTPATIENT_CLINIC_OR_DEPARTMENT_OTHER): Payer: Self-pay | Admitting: Student in an Organized Health Care Education/Training Program

## 2023-05-01 ENCOUNTER — Other Ambulatory Visit (HOSPITAL_BASED_OUTPATIENT_CLINIC_OR_DEPARTMENT_OTHER): Payer: Self-pay | Admitting: Student in an Organized Health Care Education/Training Program

## 2023-05-01 ENCOUNTER — Ambulatory Visit
Payer: Medicare PPO | Attending: Student in an Organized Health Care Education/Training Program | Admitting: Student in an Organized Health Care Education/Training Program

## 2023-05-01 ENCOUNTER — Ambulatory Visit (INDEPENDENT_AMBULATORY_CARE_PROVIDER_SITE_OTHER): Payer: Medicare PPO | Admitting: Student in an Organized Health Care Education/Training Program

## 2023-05-01 VITALS — BP 138/98 | HR 67 | Temp 97.1°F | Ht 64.0 in | Wt 212.1 lb

## 2023-05-01 VITALS — BP 114/56 | HR 66 | Temp 97.0°F | Resp 16 | Ht 64.0 in | Wt 212.5 lb

## 2023-05-01 DIAGNOSIS — N189 Chronic kidney disease, unspecified: Secondary | ICD-10-CM

## 2023-05-01 DIAGNOSIS — K222 Esophageal obstruction: Secondary | ICD-10-CM | POA: Insufficient documentation

## 2023-05-01 DIAGNOSIS — R1310 Dysphagia, unspecified: Secondary | ICD-10-CM | POA: Insufficient documentation

## 2023-05-01 DIAGNOSIS — M545 Low back pain, unspecified: Secondary | ICD-10-CM | POA: Insufficient documentation

## 2023-05-01 DIAGNOSIS — M353 Polymyalgia rheumatica: Secondary | ICD-10-CM | POA: Insufficient documentation

## 2023-05-01 DIAGNOSIS — K5730 Diverticulosis of large intestine without perforation or abscess without bleeding: Secondary | ICD-10-CM | POA: Insufficient documentation

## 2023-05-01 DIAGNOSIS — D649 Anemia, unspecified: Secondary | ICD-10-CM

## 2023-05-01 DIAGNOSIS — Z86010 Personal history of colonic polyps: Secondary | ICD-10-CM | POA: Insufficient documentation

## 2023-05-01 DIAGNOSIS — K219 Gastro-esophageal reflux disease without esophagitis: Secondary | ICD-10-CM | POA: Insufficient documentation

## 2023-05-01 LAB — URINE CULTURE,ROUTINE: URINE CULTURE: >100000 — AB

## 2023-05-01 LAB — CBC
HCT: 36.8 % (ref 34.8–46.0)
HGB: 11.4 g/dL — ABNORMAL LOW (ref 11.5–16.0)
MCH: 30 pg (ref 26.0–32.0)
MCHC: 31 g/dL (ref 31.0–35.5)
MCV: 96.8 fL (ref 78.0–100.0)
MPV: 11.1 fL (ref 8.7–12.5)
PLATELETS: 261 10*3/uL (ref 150–400)
RBC: 3.8 10*6/uL — ABNORMAL LOW (ref 3.85–5.22)
RDW-CV: 13.7 % (ref 11.5–15.5)
WBC: 11.3 10*3/uL — ABNORMAL HIGH (ref 3.7–11.0)

## 2023-05-01 LAB — C-REACTIVE PROTEIN(CRP),INFLAMMATION: CRP INFLAMMATION: 126.9 mg/L — ABNORMAL HIGH (ref <8.0)

## 2023-05-01 LAB — BASIC METABOLIC PANEL
ANION GAP: 8 mmol/L (ref 4–13)
BUN/CREA RATIO: 18 (ref 6–22)
BUN: 27 mg/dL — ABNORMAL HIGH (ref 8–25)
CALCIUM: 9.4 mg/dL (ref 8.6–10.3)
CHLORIDE: 102 mmol/L (ref 96–111)
CO2 TOTAL: 25 mmol/L (ref 23–31)
CREATININE: 1.53 mg/dL — ABNORMAL HIGH (ref 0.60–1.05)
ESTIMATED GFR - FEMALE: 35 mL/min/BSA — ABNORMAL LOW (ref >=60)
GLUCOSE: 160 mg/dL — ABNORMAL HIGH (ref 65–125)
POTASSIUM: 4.4 mmol/L (ref 3.5–5.1)
SODIUM: 135 mmol/L — ABNORMAL LOW (ref 136–145)

## 2023-05-01 LAB — VITAMIN B12: VITAMIN B 12: 220 pg/mL (ref 200–900)

## 2023-05-01 LAB — FERRITIN: FERRITIN: 172 ng/mL (ref 5–200)

## 2023-05-01 LAB — FOLATE: FOLATE: 7.6 ng/mL (ref 7.0–31.0)

## 2023-05-01 LAB — IRON TRANSFERRIN AND TIBC
IRON (TRANSFERRIN) SATURATION: 6 % — ABNORMAL LOW (ref 20–50)
IRON: 20 ug/dL — ABNORMAL LOW (ref 45–170)
TOTAL IRON BINDING CAPACITY: 336 ug/dL (ref 224–476)
TRANSFERRIN: 240 mg/dL (ref 160–340)

## 2023-05-01 MED ORDER — PREDNISONE 1 MG TABLET
ORAL_TABLET | ORAL | 2 refills | Status: DC
Start: 2023-05-01 — End: 2023-07-04

## 2023-05-01 NOTE — Nursing Note (Signed)
Return patient s/p EUS from 04/15/2023 with no complaints for this visit.

## 2023-05-01 NOTE — Progress Notes (Signed)
[name]  [address]  [name] [facility] [id number]    HPI:  This is a case of a 77 y.o. year old female who comes in today for follow-up of polymyalgia rheumatica    Medical conditions notable for:  -hospitalized 10/29/22-11/01/22 for acute hypoxic respiratory failure.  That have significant proximal pain and weakness in her shoulders and hips with elevated inflammatory markers.  Discharged on prednisone 50 mg daily.  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 Over the last 6 months. She can only stand for a minutes. She has shoulder pain. She had significant symptoms in the shoulder and headaches while on 2 mg prednisone so she went back to 5 mg daily. She is back to 4 mg daily. She has been on 4 mg for about 3 weeks. Energy is poor.     Initial HPI listed below  She was having neck and shoulder pain. She had been getting left sided frontal headaches at that time. No jaw locking or issues with her tongue. She had some blurry vision around that time, but no vision loss. Patient is currently on prednisone 5 mg daily for PMR for around a month. Shoulders are back to her baseline. She has had issues with heartburn before but it is well controlled currently on omeprazole. She has had esophageal dilations in the past.     Denies having any shortness of breath, chest pain, abdominal pain, fevers, unintential weight loss.    All systems reviewed and are otherwise unremarkable unless stated above.    Social Hx- no alcohol use, no smoking, resides in [place]   Family Hx- no family hx of RA or SLE    Current Outpatient Medications   Medication Sig Dispense Refill    acetaminophen (TYLENOL) 500 mg Oral Tablet Take 1 Tablet (500 mg total) by mouth Every night as needed Takes 2 at night      albuterol sulfate (PROVENTIL OR VENTOLIN OR PROAIR) 90  mcg/actuation Inhalation oral inhaler Take 1-2 Puffs by inhalation Every 6 hours as needed 1 Each 3    albuterol sulfate (PROVENTIL) 2.5 mg /3 mL (0.083 %) Inhalation nebulizer solution Take 3 mL (2.5 mg total) by nebulization Every 4 hours as needed for Wheezing 90 Each 3    aspirin (ECOTRIN) 81 mg Oral Tablet, Delayed Release (E.C.) Take 1 Tablet (81 mg total) by mouth Once a day      calcium carbonate/vitamin D3 (VITAMIN D-3 ORAL) Take 2,000 Int'l Units/day by mouth Once a day      ezetimibe (ZETIA) 10 mg Oral Tablet Take 1 Tablet (10 mg total) by mouth Every evening 90 Tablet 1    FLUoxetine (PROZAC) 20 mg Oral Capsule Take 1 Capsule (20 mg total) by mouth Once a day 90 Capsule 1    furosemide (LASIX) 20 mg Oral Tablet Take 1 Tablet (20 mg total) by mouth Twice per day as needed 60 Tablet 0    metoprolol succinate (TOPROL-XL) 25 mg Oral Tablet Sustained Release 24 hr Take 1 Tablet (25 mg total) by mouth Every night 90 Tablet 1    naproxen (NAPROSYN) 500 mg Oral Tablet Take 1 Tablet (500 mg total) by mouth Twice per day as needed for Pain 60 Tablet 3    omeprazole (PRILOSEC) 20 mg Oral  Capsule, Delayed Release(E.C.) Take 1 Capsule (20 mg total) by mouth Once a day 90 Capsule 1    predniSONE (DELTASONE) 1 mg Oral Tablet Take 3 tablets daily for 21 days, then alternate daily doses of 2 tabs and 3 tabs for 21 days, then start 2 tabs of prednisone daily for 21 days 120 Tablet 2    tiZANidine (ZANAFLEX) 4 mg Oral Tablet TAKE 1 TABLET BY MOUTH THREE TIMES A DAY AS NEEDED FOR MUSCLE CRAMPS STRENGTH: 4 MG 270 Tablet 1    TRULICITY 0.75 mg/0.5 mL Subcutaneous Pen Injector INJECT 0.5 ML (0.75 MG) UNDER  THE SKIN EVERY 7 DAYS (Patient taking differently: sundays) 6 mL 3     No current facility-administered medications for this visit.     Allergies   Allergen Reactions    Amoxicillin  Other Adverse Reaction (Add comment)     Unknown reaction    Augmentin [Amoxicillin-Pot Clavulanate] Nausea/ Vomiting    Statins-Hmg-Coa  Reductase Inhibitors Myalgia     Past Medical History:   Diagnosis Date    Arthritis     Asthma     Back problem     BMI 35.0-35.9,adult 02/16/2020    CAD (coronary artery disease)     mild    Cataract     Cataracts, bilateral     CHF (congestive heart failure) (CMS HCC)     Chronic bronchitis with emphysema (CMS HCC)     Chronic low back pain     Claustrophobia     Depression     Diabetes (CMS HCC)     Diabetes mellitus, type 2 (CMS HCC)     Encounter for support and coordination of transition of care 11/16/2022    Esophageal reflux     Essential hypertension 01/28/2019    H/O urinary tract infection     HH (hiatus hernia)     History of kidney disease     states, "low kidney functions."    Hypercholesterolemia     Hypertension     Hypertriglyceridemia     Type 2 diabetes mellitus (CMS HCC)          Past Surgical History:   Procedure Laterality Date    CATARACT EXTRACTION, BILATERAL Bilateral     bilateral lens implant    COLONOSCOPY      ENDOSCOPIC ULTRASOUND ESOPHGEAL  04/15/2023    ESOPHAGOGASTRODUODENOSCOPY      HX CARPAL TUNNEL RELEASE      HX CATARACT REMOVAL Right 12/30/2019    HX CATARACT REMOVAL Left 01/06/2020    HX CHOLECYSTECTOMY      HX EXPOSURE TO METAL SHAVINGS      HX HEART CATHETERIZATION      HX HYSTERECTOMY      HX OOPHORECTOMY      HX TAH AND BSO      HX TUBAL LIGATION      HX VEIN STRIPPING      Left leg     PHYSICAL EXAM:  VITALS: BP (!) 138/98 Comment: Pt states normal with pain and over exertion  Pulse 67   Temp 36.2 C (97.1 F) (Thermal Scan)   Ht 1.626 m (5\' 4" )   Wt 96.2 kg (212 lb 1.3 oz)   SpO2 97%   BMI 36.40 kg/m     General- well appearing woman resting comfortably in chair  Neurologic- no focal deficits noted, speaks in full sentences and answers questions appropriately   HEENT- non traumatic skull, no scleral icterus, normal appearing oral mucosa,  no scalp tenderness,  Lungs- normal work of breathing, CTA bilaterally, no wheezes, no rhonchi  Heart- regular rate, regular  rhythm, normal S1 and S2, no murmur no carotid bruit noted, normal radial pulses bilaterally  Abdomen- soft, non tender, non distended,   Skin-  no rashes noted on exposed skin, normal appearing finger nails bilaterally   Musculoskeletal- no synovitis, normal range of motion shoulders bilaterally, no tenderness to subacromial bursa,, 5/5 muscle strength in upper and lower extremities  Psych- appropriate mood and corresponding affect     Labs/imaging:  WBC 11.3, hemoglobin 11.4, platelet count 261   Creatinine 1.5    Region BMD  (g/cm) Young Adult  T-score Age Matched  Z-score   AP Spine 1.085 -0.8 0.0   Left Hip total 0.815 -1.5 -0.5   Left Hip Femoral Neck 0.729 -2.2 -0.9     Hip total           Hip Femoral Neck           Forearm           Forearm            Note: T-Scores (Standard deviation from normal young adult mean)  Normal T-Score > -1 BMD within 1 SD of a "young normal" adult   Osteopenia (low bone mass) T-Score = -1.0 to -2.5 BMD is between 1 and 2.5 SD below that of a "young normal" adult   Osteoporosis T-Score < -2.5 BMD is 2.5 SD or more below that of a "young normal" adult      FRAX* Results:      10-Year Probability of Fracture   Major Osteoporotic Fracture  20.6%% Hip Fracture  5.4%%         Assessment and Plan:  77 y.o. year old female presents today for follow-up of polymyalgia rheumatica.  Patient had significantly elevated inflammatory markers last fall when she was symptomatic from scalp tenderness, headaches, shoulder and girdle stiffness which resolved with high doses of corticosteroids.  Temporal artery biopsy negative.  Patient had good response to steroid taper however developed worsening shoulder stiffness, pain, and headaches when she went from 3 mg daily prednisone to 2 mg daily prednisone.  Patient went back to 5 mg daily and taper to 4 mg daily which he was currently on.  Patient encouraged to please reach out to let us know if she develops recurrent symptoms.  Continue prednisone  taper as outlined below.  Low threshold to try to get patient transitioned to [facility] she continues to have recurrent urinary tract infections or unable to get to a dose of prednisone 3 mg or below.    Of note patient has had worsening lower back pain over the last 6 months.  She was amenable trying physical therapy.  A prescription has been placed.  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  Will update x-ray films.  Patient may benefit from seeing pain Medicine if pain remains refractory despite physical therapy.    1. Polymyalgia rheumatica (CMS HCC)  - in remission on 4 mg daily prednisone  - follow-up 11 weeks  - C-REACTIVE PROTEIN(CRP),INFLAMMATION; Future  - predniSONE (DELTASONE) 1 mg Oral Tablet; Take 3 tablets daily for 21 days, then alternate daily doses of 2 tabs and 3 tabs for 21 days, then start 2 tabs of prednisone daily for 21 days  Dispense: 120 Tablet; Refill: 2    2. Lower back pain  - Refer to Physical Therapy-External; Future  -  XR LUMBAR SPINE AP AND LAT; Future    Return in about 11 weeks (around 07/17/2023) for In Person Visit.    Feliz Beam, MD

## 2023-05-01 NOTE — Progress Notes (Signed)
[facility]                                                      Department of Gastroenterology  [address]  [place], [place] [address]    Date:   05/01/2023  Name: Joanna Black  Age: 77 y.o.    Referring Provider:    No referring provider defined for this encounter.    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 female with past medical history of cholecystectomy, colon polyps, chronic diarrhea, gastroesophageal reflux disease, esophageal stricture with dilatation, coronary artery disease, hypertension, hyperlipidemia, arthritis, obesity, diverticulosis      Clinic visit  04-19-2022   =================  Patient is here for new visit   Patient complaining of worsening dysphagia, she has history of esophageal stricture with previous dilatation and [facility], she states that she did not have dilatation to the maximum because unavailability of larger sized balloons   She is unable to tolerate liquids or solids recently, everything gets stuck in the chest  She has history of colon polyps, colonoscopy long time ago, no report available, due for surveillance  No GI cancer in family    She has cholecystectomy before, chronic diarrhea mainly after eating, previously treated with colestipol with partial improvement  She has heartburn, taking famotidine, her PCP switch PPI to famotidine due to concern for side effects, she has worsening symptoms and indigestion  No NSAIDs use  No smoking or drinking    The patient denies any abdominal pain, nausea, vomiting, abdominal bloating, constipation,  rectal bleeding, melena, hematemesis, unintentional  weight loss or other complaints.    Clinic Visit 06/15/2022:  Ms. [name] is here today for follow up s/p EGD as discussed below. She is doing better and currently denies any GI concerns. She still experiences postprandial dirrhea. Pt states she was on cholestyramine which then caused constipation.  Patient stopped taking cholestyramine due to constipation and also disliking the taste of it.  GERD is improved with famotidine.  Patient states it was better controlled with omeprazole in the past.  She has CT AP scheduled later today to evaluate duodenal segment epithelial lesion.  This was evaluated recently by EUS which was unremarkable for any malignant cells, CT AP was then ordered.        Clinic visit   May-9-24    =================  Patient is here for follow up   Patient underwent EGD October 2023 with dilatation of Schatzki's ring up to 20 mm, dysphagia improved..    EGD October 2023 showed normal upper middle esophagus, normal lower 3rd of esophagus, regular Z-line at 34 cm no esophagitis.  Low-grade narrowing of Schatzki's ring dilated from 18 mm to 20 mm with mild resistance at 20 mm, the ring was also instructed using cold forceps cm hiatal hernia Hill grade 2.  Bile in the stomach was aspirated, normal gastric body and antrum, normal duodenal bulb,  submucosal nodule in the 2nd part of the duodenum    EUS FNA April 15, 2023 for duodenal submucosal lesion showed 18 x 14 mm submucosal lesion in the 2nd portion of duodenum this is smaller than previously measured lesion biopsies were taken    Satisfactory for evaluation.  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    Comments    Immunostains for CK AE1/AE3, smooth muscle actin, S100, CD117, DOG 1, and SOX 10 have been performed, supporting the above diagnosis.  The differential diagnosis includes peripheral nerve sheath tumor,  ganglioneuroma, and mucosal Schwann cell hamartoma.     Heartburn controlled with omeprazole  Patient has iron-deficiency anemia  Not taking iron, denies any overt GI bleeding    Would like to defer colonoscopy at this time and will discuss next visit  Diarrhea resolved  The patient denies any abdominal pain, nausea, vomiting, abdominal bloating, constipation, diarrhea, rectal bleeding, melena, hematemesis, unintentional weight loss or other complaints.    Review of Systems  See HPI.  No chest pain or sob  No fever or chills     Past Medical History: She  has a past medical history of Arthritis, Asthma, Back problem, BMI 35.0-35.9,adult (02/16/2020), CAD (coronary artery disease), Cataract, Cataracts, bilateral, CHF (congestive heart failure) (CMS HCC), Chronic bronchitis with emphysema (CMS HCC), Chronic low back pain, Claustrophobia, Depression, Diabetes (CMS HCC), Diabetes mellitus, type 2 (CMS HCC), Encounter for support and coordination of transition of care (11/16/2022), Esophageal reflux, Essential hypertension (01/28/2019), H/O urinary tract infection, HH (hiatus hernia), History of kidney disease, Hypercholesterolemia, Hypertension, Hypertriglyceridemia, and Type 2 diabetes mellitus (CMS HCC).    She has no past medical history of Breast CA (CMS HCC), Cancer (CMS HCC), Cervical cancer (CMS HCC), Colon cancer (CMS HCC), Endometrial cancer (CMS HCC), Ovarian cancer (CMS HCC), or Uterine cancer (CMS HCC).      Past Surgical History: She  has a past surgical history that includes hx hysterectomy; hx carpal tunnel release; hx tubal ligation; hx gall bladder surgery/chole; hx vein stripping; hx exposure to metal shavings; Esophagogastroduodenoscopy; colonoscopy; hx tah and bso; hx heart catheterization; Cataract extraction, bilateral (Bilateral); hx cataract removal (Right, 12/30/2019); hx cataract removal (Left, 01/06/2020); hx oophorectomy; and endoscopic ultrasound esophgeal (04/15/2023).    Social History:  Her  reports that she has never smoked. She has never been exposed to tobacco smoke. She has never used smokeless tobacco. She reports that she does not drink alcohol and does not use drugs., ,     Family History: She family history includes Bone cancer in her father; Diabetes in her multiple family members; Hypertension (High Blood Pressure) in her multiple family members; No Known Problems in her mother; Stomach Cancer in her paternal grandmother.    Allergies: She is allergic to amoxicillin, augmentin [amoxicillin-pot clavulanate], and statins-hmg-coa reductase inhibitors.    Home Medication:   Current Outpatient Medications   Medication Sig    acetaminophen (TYLENOL) 500 mg Oral Tablet Take 1 Tablet (500 mg total) by mouth Every night as needed Takes 2 at night    albuterol sulfate (PROVENTIL OR VENTOLIN OR PROAIR) 90 mcg/actuation Inhalation oral inhaler Take 1-2 Puffs by inhalation Every 6 hours as needed    albuterol sulfate (PROVENTIL) 2.5 mg /3 mL (0.083 %) Inhalation nebulizer solution Take 3 mL (2.5 mg total) by nebulization Every 4 hours as needed for Wheezing  aspirin (ECOTRIN) 81 mg Oral Tablet, Delayed Release (E.C.) Take 1 Tablet (81 mg total) by mouth Once a day    calcium carbonate/vitamin D3 (VITAMIN D-3 ORAL) Take 2,000 Int'l Units/day by mouth Once a day    ezetimibe (ZETIA) 10 mg Oral Tablet Take 1 Tablet (10 mg total) by mouth Every evening    FLUoxetine (PROZAC) 20 mg Oral Capsule Take 1 Capsule (20 mg total) by mouth Once a day    furosemide (LASIX) 20 mg Oral Tablet Take 1 Tablet (20 mg total) by mouth Twice per day as needed    metoprolol succinate (TOPROL-XL) 25 mg Oral Tablet Sustained Release 24 hr Take 1 Tablet (25 mg total) by mouth Every night    naproxen (NAPROSYN) 500 mg Oral Tablet Take 1 Tablet (500 mg total) by mouth Twice per day as needed for Pain    omeprazole (PRILOSEC) 20 mg Oral Capsule, Delayed Release(E.C.) Take 1 Capsule (20 mg total) by mouth Once a day    predniSONE  (DELTASONE) 5 mg Oral Tablet Take 1 Tablet (5 mg total) by mouth Once a day    tiZANidine (ZANAFLEX) 4 mg Oral Tablet TAKE 1 TABLET BY MOUTH THREE TIMES A DAY AS NEEDED FOR MUSCLE CRAMPS STRENGTH: 4 MG (Patient not taking: Reported on 05/01/2023)    TRULICITY 0.75 mg/0.5 mL Subcutaneous Pen Injector INJECT 0.5 ML (0.75 MG) UNDER  THE SKIN EVERY 7 DAYS (Patient taking differently: sundays)       Examination:  BP (!) 114/56   Pulse 66   Temp 36.1 C (97 F) (Temporal)   Resp 16   Ht 1.626 m (5\' 4" )   Wt 96.4 kg (212 lb 8.4 oz)   SpO2 95%   BMI 36.48 kg/m        Wt Readings from Last 3 Encounters:   05/01/23 96.4 kg (212 lb 8.4 oz)   04/17/23 99 kg (218 lb 3.2 oz)   04/15/23 98.4 kg (216 lb 14.9 oz)       General: Resting comfortably, no acute distress.  Eyes: Conjunctiva normal, sclera non-icteric.  HENT: Atraumatic and normocephalic.   Neck:  Supple.   Lungs: Breathing comfortably.  No respiratory distress.  Abdomen: soft lax, not tender   Extremities: No cyanosis or edema  Neurologic: Alert, Oriented, Grossly normal  Psychiatric: Appears normal    Data/Chart/Labs/Imaging reviewed:  -CBC in April 2023 with WBC 9.2, hemoglobin 13.1, platelet 219  -BMP in April 2023 with creatinine 1.44, otherwise unremarkable  -liver panel in December 2022 normal    -vitamin-D in April 2023 was 41.5  -A1c in March 2023 was 5.9  -EGD in 2020 showed esophageal stricture, dilated distal middle and proximal esophagus, gastritis, lipoma of the 2nd part of duodenum, no pathology available, report not mentionening  the size of dilatation  -colonoscopy in 2018 with diverticulosis, biopsy taken to rule out microscopic colitis, no pathology available    -CT abdomen pelvis November 2023 with IV contrast showed no acute findings.      Fluoro esophagram barium swallow 04/25/2022:  Small hiatal hernia, with Schatzki ring measuring 1.0 cm in diameter.    EUS 05/10/2022:  Hypoechoic lesion 2nd portion of the duodenum.  This is in the submucosa,  but, focally there is invasion of the muscularis propriae.  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  Biopsied.  Pathology:  Duodenal nodule.  Satisfactory for evaluation.  Negative for malignant cells.    EGD on 06/05/2022:  Normal upper 3rd of esophagus.  Normal middle 3rd  of esophagus.  Normal lower 3rd of esophagus.  Low-grade or narrowing Schatzki ring.  Dilated from 18-19 mm with significant resistance at 19 mm.  Z-line regular, 34 cm from incisors.  No esophagitis.  3 cm hiatal hernia.  Erythematous mucosa in the antrum.  No specimens collected.  Hill grade 2 hiatal hernia.  Normal duodenal bulb.  Duodenal subepithelial lesion.  This was evaluated by EUS pending CT abdomen scheduled 06/18/2022.    EGD October 2023 showed normal upper middle esophagus, normal lower 3rd of esophagus, regular Z-line at 34 cm no esophagitis.  Low-grade narrowing of Schatzki's ring dilated from 18 mm to 20 mm with mild resistance at 20 mm, the ring was also instructed using cold forceps cm hiatal hernia Hill grade 2.  Bile in the stomach was aspirated, normal gastric body and antrum, normal duodenal bulb, submucosal nodule in the 2nd part of the duodenum    EUS FNA April 15, 2023 for duodenal submucosal lesion showed 18 x 14 mm submucosal lesion in the 2nd portion of duodenum this is smaller than previously measured lesion biopsies were taken    Satisfactory for evaluation.  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    Comments    Immunostains for CK AE1/AE3, smooth muscle actin, S100, CD117, DOG 1, and SOX 10 have been performed, supporting the above diagnosis.  The differential diagnosis includes peripheral nerve sheath tumor, ganglioneuroma, and mucosal Schwann cell hamartoma.           _________________________________________________________________________  Assessment and Plan  KALIROSE HEDRICH is a 77 y.o. female with  the following issues:    Dysphagia to solid and liquid improved s/p dilation. EGD on 05/2022 showed Schatzki ring.  Repeated EGD October 2023 with dilatation of Schatzki's ring from 18 mm to 20 mm  3 cm hiatal hernia, Hill grade  Gastroesophageal reflux disease without esophagitis currently controlled with omeprazole  Chronic iron-deficiency anemia, denies any overt GI bleeding celiac disease screening unremarkable, denies any NSAIDs use  History of colon polyps long time ago, due for surveillance.   Postprandial diarrhea, history of cholecystectomy, possible bile salt diarrhea, partial improvement with colestipol.  Patient stopped taking cholestyramine due to constipation and the taste of the medication.  Currently denies any diarrhea, taking Metamucil b.i.d.  Colonic diverticulosis without complication  Medium-sized subepithelial lesion with no bleeding was found in the 2nd portion of the duodenum.  This was recently evaluated by EUS which was negative for malignant cells.    CT abdomen pelvis November 2023 showed no acute findings.  Repeated EUS April 2024 showed 18 x 14 mm submucosal lesions FNA showed hamartomatous neural origin tumor, benign    My recommendations are as follows:    -patient canceled her colonoscopy, recommended to have colonoscopy specially in the setting of iron-deficiency anemia, patient would like to defer, will discuss again next visit  -will start oral iron daily for 3 months  -check CBC, iron studies, B12, folic acid, copper level  -CT abdomen pelvis November 2023 was unremarkable for acute findings  -celiac disease screening unremarkable  -patient currently not taking cholestyramine or colestipol, denies any diarrhea  --Avoid NSAIDs  -Anti-reflux measures discussed with patient.   -Avoid triggering foods  -Elevate head of bed at night or use more pillows  -Avoid eating 2-3 hours prior to bedtime   -continue omeprazole 20 mg p.o. daily.  Take it 30 minutes before breakfast.  -Recommend  weight loss of at least 10 % of current body weight   -Small bites, Small sips,  Swallow each bite/sip before taking next swallow and Remain upright after meals for  30 minutes after eating, slow rate of intake. Recommend to take his medication with  puree or pudding consistency if having difficulty swallowing medications   -Recommended to eat a high-fiber diet with fresh fruits and vegetables.  Suggested to increase fluid intake and to drink more water when eating more fiber.  Also suggested regular exercise.  -recommend Metamucil BID  -repeat CBC and iron studies next visit    The patient was given the opportunity to ask questions and those questions were appropriately answered. She agreed with the treatment plan and is encouraged to call with any additional questions or concerns.     Return to the clinic in 6 months or sooner as needed        Joslyn Devon, MD  [facility]  [place], [place] [address]        Note: A portion of this documentation was generated using MMODAL (voice recognition software) and may contain syntax/voice recognition errors.

## 2023-05-02 ENCOUNTER — Emergency Department (HOSPITAL_COMMUNITY): Payer: Medicare PPO

## 2023-05-02 ENCOUNTER — Other Ambulatory Visit: Payer: Self-pay

## 2023-05-02 ENCOUNTER — Encounter (HOSPITAL_COMMUNITY): Payer: Self-pay

## 2023-05-02 ENCOUNTER — Emergency Department
Admission: EM | Admit: 2023-05-02 | Discharge: 2023-05-02 | Disposition: A | Discharge status: Home / Self Care | Payer: Medicare PPO | Attending: Emergency Medicine | Admitting: Emergency Medicine

## 2023-05-02 ENCOUNTER — Other Ambulatory Visit (INDEPENDENT_AMBULATORY_CARE_PROVIDER_SITE_OTHER): Payer: Self-pay | Admitting: Student in an Organized Health Care Education/Training Program

## 2023-05-02 DIAGNOSIS — M545 Low back pain, unspecified: Secondary | ICD-10-CM

## 2023-05-02 DIAGNOSIS — R7982 Elevated C-reactive protein (CRP): Secondary | ICD-10-CM

## 2023-05-02 DIAGNOSIS — R262 Difficulty in walking, not elsewhere classified: Secondary | ICD-10-CM

## 2023-05-02 DIAGNOSIS — R252 Cramp and spasm: Secondary | ICD-10-CM | POA: Insufficient documentation

## 2023-05-02 DIAGNOSIS — M62838 Other muscle spasm: Secondary | ICD-10-CM

## 2023-05-02 MED ORDER — MAGNESIUM OXIDE 400 MG (241.3 MG MAGNESIUM) TABLET: 400.0000 mg | ORAL_TABLET | Freq: Three times a day (TID) | ORAL | 0 refills | Status: DC

## 2023-05-02 NOTE — ED Nurses Note (Signed)
Discussed magnesium and muscle cramps, Patient to pick up Magnesium at [facility]. Written and verbal discharge instructions given to patient, patient stated understanding, Declined wheelchair, Alert and oriented  no distress ambulatory to lobby.

## 2023-05-02 NOTE — ED Triage Notes (Signed)
Muscle spasms bilateral legs. Left thigh area, right lower leg area. Intermittent.   Worse at night.   Back/legs hurt when she walks/stands.   Seen at [facility] yesterday.

## 2023-05-02 NOTE — ED Provider Notes (Signed)
[facility] - Emergency Department  ED Primary Provider Note  History of Present Illness   Chief Complaint   Patient presents with    Muscle Spasm     Joanna Black is a 77 y.o. female who had concerns including Muscle Spasm.  Arrival: The patient arrived by Car    77 year old female presents for evaluation of intermittent muscle cramps.  She was states he was were more notable last evening while she was trying to sleep.  She describes cramps in the bilateral calves as well as inner thighs.  She goes on describe she has had imaging recently and was advised that she would narrowing of the lower spine, presumably spinal stenosis.  She was not have symptoms of neurogenic claudication by way of history.  There has been no new injury.  She did have laboratory studies performed just over 24 hours ago.  She was noted some relatively decreased eGFR.  Magnesium was not performed yesterday.        History Reviewed This Encounter: Medical History  Surgical History  Family History  Social History    Physical Exam   ED Triage Vitals [05/02/23 1505]   BP (Non-Invasive) (!) 156/57   Heart Rate 71   Respiratory Rate 16   Temperature 36.1 C (96.9 F)   SpO2 94 %   Weight 96.2 kg (212 lb 1.3 oz)   Height 1.626 m (5\' 4" )     Physical Exam  Vitals and nursing note reviewed.   Constitutional:       General: She is not in acute distress.     Appearance: She is well-developed.   HENT:      Head: Normocephalic and atraumatic.   Eyes:      Conjunctiva/sclera: Conjunctivae normal.   Cardiovascular:      Rate and Rhythm: Normal rate and regular rhythm.      Heart sounds: No murmur heard.  Pulmonary:      Effort: Pulmonary effort is normal. No respiratory distress.      Breath sounds: Normal breath sounds.   Abdominal:      Palpations: Abdomen is soft.      Tenderness: There is no abdominal tenderness.   Musculoskeletal:         General: No swelling.      Cervical back: Neck supple.   Skin:     General: Skin is warm  and dry.      Capillary Refill: Capillary refill takes less than 2 seconds.   Neurological:      Mental Status: She is alert.   Psychiatric:         Mood and Affect: Mood normal.       Patient Data   Labs Ordered/Reviewed - No data to display  No orders to display     Medical Decision Making        Medical Decision Making  77 year old female with intermittent cramping of the lower extremities.  She was no symptoms currently.  She will be put on a trial of oral magnesium to see if symptoms improve.  Do not suspect acute neurological etiology she was currently asymptomatic and based on her description of symptoms.  She was follow up with the primary care as needed for ongoing minor symptoms.  Return precautions reviewed prior to discharge.    Risk  OTC drugs.                   Clinical Impression   Muscle  spasm (Primary)       Disposition: Discharged

## 2023-05-03 ENCOUNTER — Telehealth (HOSPITAL_COMMUNITY): Payer: Self-pay | Admitting: Family Medicine

## 2023-05-03 NOTE — Progress Notes (Signed)
Post Ed Follow-Up    Post ED Follow-Up:   Document completed and/or attempted interactive contact(s) after transition to home after emergency department stay.:   Transition Facility and relevant Date:   Discharge Date: 05/02/23  Discharge from [facility] Emergency Department?: Yes  Discharge Facility: [facility]  Contacted by: Zadie Cleverly, RN  Contact method: Patient/Caregiver Telephone  Contact completed: 05/03/2023 10:05 AM  Was the AVS reviewed with patient?: Yes  How is the patient recovering?: Improving  Medications prescribed: Yes  Were they obtained?: Yes  Interventions: No needs identified

## 2023-05-06 ENCOUNTER — Telehealth (HOSPITAL_BASED_OUTPATIENT_CLINIC_OR_DEPARTMENT_OTHER): Payer: Self-pay | Admitting: Student in an Organized Health Care Education/Training Program

## 2023-05-06 ENCOUNTER — Other Ambulatory Visit (HOSPITAL_BASED_OUTPATIENT_CLINIC_OR_DEPARTMENT_OTHER): Payer: Self-pay | Admitting: Student in an Organized Health Care Education/Training Program

## 2023-05-06 ENCOUNTER — Telehealth (HOSPITAL_BASED_OUTPATIENT_CLINIC_OR_DEPARTMENT_OTHER): Payer: Self-pay

## 2023-05-06 MED ORDER — FERROUS SULFATE 325 MG (65 MG IRON) TABLET
325.0000 mg | ORAL_TABLET | Freq: Every day | ORAL | 0 refills | Status: DC
Start: 2023-05-06 — End: 2023-07-03

## 2023-05-06 NOTE — Telephone Encounter (Signed)
Patient is scheduled with [facility] on 05/07/23 @ 2:00 pm.

## 2023-05-06 NOTE — Telephone Encounter (Signed)
Called and spoke with patient's son, Joanna Black. Advised per Dr. Lind Covert the recent labs which showed:     Hemoglobin 11.4 stable, mild leukocytosis   Chronic kidney disease creatinine 1.5, will give referral to nephrology for further evaluation   Improved hyperkalemia   Iron-deficiency anemia, will give oral iron daily for 3 months   Normal folic acid and B12   Recommend having colonoscopy for iron-deficiency anemia, patient would like to defer colonoscopy at this time     I will see Joanna Black in the clinic again as previously scheduled to further discuss about the results in-detail. I will formulate further treatment plan at the time.   Joanna Black voiced understanding.  Horton Finer, LPN    BASIC METABOLIC PANEL  Order: [id number]   Status: Final result       Visible to patient: Yes (seen)       Dx: Chronic anemia    1 Result Note            Component  Ref Range & Units 5 d ago  (05/01/23) 3 mo ago  (01/23/23) 6 mo ago  (11/01/22) 6 mo ago  (10/31/22) 6 mo ago  (10/31/22) 6 mo ago  (10/30/22) 6 mo ago  (10/29/22)   SODIUM  136 - 145 mmol/L 135 Low  137 135 Low  132 Low  129 Low  CM 132 Low  134 Low    POTASSIUM  3.5 - 5.1 mmol/L 4.4 5.4 High  4.5 4.8 4.9 4.0 4.0   CHLORIDE  96 - 111 mmol/L 102 105 106 103 103 96 96   CO2 TOTAL  23 - 31 mmol/L 25 25 23 23 18  Low  27 29   ANION GAP  4 - 13 mmol/L 8 7 6 6 8 9 9    CALCIUM  8.6 - 10.3 mg/dL 9.4 9.3 CM 9.1 CM 9.1 CM 9.1 CM 9.4 CM 9.9 CM   Comment: Gadolinium-containing contrast can interfere with calcium measurement.   GLUCOSE  65 - 125 mg/dL 409 High  811 914 High  139 High  223 High  160 High  148 High    BUN  8 - 25 mg/dL 27 High  22 25 27  High  28 High  16 15   CREATININE  0.60 - 1.05 mg/dL 7.82 High  9.56 High  2.13 High  1.42 High  1.72 High  1.41 High  1.28 High    BUN/CREA RATIO  6 - 22 18 16 20 19 16 11 12    ESTIMATED GFR - FEMALE  >=60 mL/min/BSA 35 Low  40 Low  CM 45 Low  CM 38 Low  CM 30 Low  CM 38 Low  CM 43 Low  CM   Comment: Estimated Glomerular Filtration Rate (eGFR) is  calculated using the gender-dependent CKD-EPI (2021) equation, intended for patients 71 years of age and older.    If patient sex is not documented, or if patient sex and gender are incongruent, eGFR results calculated for both female and female will be reported. Recommend correlation and careful interpretation of patient history, keeping in mind that serum creatinine is influenced by hormone therapy.    Stage, GFR, Classification  G1, 90, Normal or High  G2, 60-89, Mildly decreased  G3a, 45-59, Mildly to moderately decreased  G3b, 30-44, Moderately to severely decreased  G4, 15-29, Severely decreased  G5, <15, Kidney failure  In the absence of kidney damage, neither G1 or G2 fulfill criteria for CKD per KDIGO.   Resulting Agency  [facility]              Specimen Collected: 05/01/23 12:10 Last Resulted: 05/01/23 12:58           IRON TRANSFERRIN AND TIBC  Order: [id number]   Status: Final result       Visible to patient: Yes (seen)       Dx: Chronic anemia    1 Result Note      Component  Ref Range & Units 5 d ago   TOTAL IRON BINDING CAPACITY  224 - 476 ug/dL 811   IRON (TRANSFERRIN) SATURATION  20 - 50 % 6 Low    IRON  45 - 170 ug/dL 20 Low    TRANSFERRIN  160 - 340 mg/dL 914   Resulting Agency [facility]              Narrative  Performed by: [place]  In hereditary hemochromatosis, serum iron is usually >150 ug/dL and % saturation is >78%. In advanced iron overload, % saturation is often >90%.      Specimen Collected: 05/01/23 12:10 Last Resulted: 05/01/23 12:58               CBC  Order: [id number]   Status: Final result       Visible to patient: Yes (seen)       Dx: Chronic anemia    1 Result Note            Component  Ref Range & Units 5 d ago  (05/01/23) 3 mo ago  (01/23/23) 6 mo ago  (11/01/22) 6 mo ago  (10/31/22) 6 mo ago  (10/31/22) 6 mo ago  (10/30/22) 6 mo ago  (10/29/22)   WBC  3.7 - 11.0 x10^3/uL 11.3 High  9.5 R 13.9 High  R 10.2 R 10.2 R 14.8 High  R 13.4 High  R   RBC  3.85 - 5.22  x10^6/uL 3.80 Low  3.76 Low  R 3.30 Low  R  3.52 Low  R 3.76 Low  R 4.20 R   HGB  11.5 - 16.0 g/dL 65.7 Low  84.6 Low  R 96.2 Low  R  10.6 Low  R 11.3 Low  R 12.7 R   HCT  34.8 - 46.0 % 36.8 35.2 Low  R 30.0 Low  R  32.3 Low  R 34.5 Low  R 38.5 R   MCV  78.0 - 100.0 fL 96.8 93.6 R 91.0 R  91.9 R 91.7 R 91.5 R   MCH  26.0 - 32.0 pg 30.0 31.7 R 30.2 R  30.2 R 29.9 R 30.1 R   MCHC  31.0 - 35.5 g/dL 95.2 84.1 R 32.4 R  40.1 R 32.7 R 32.9 R   RDW-CV  11.5 - 15.5 % 13.7         PLATELETS  150 - 400 x10^3/uL 261 272 R 249 R  232 R 243 R 251 R   MPV  8.7 - 12.5 fL 11.1 8.8 R 8.9 R  9.3 R 9.0 R 9.7 High  R   Resulting Agency [facility]              Specimen Collected: 05/01/23 12:10 Last Resulted: 05/01/23 12:29           FERRITIN  Order: [id number]   Status: Final result  Visible to patient: Yes (seen)       Dx: Chronic anemia    1 Result Note      Component  Ref Range & Units 5 d ago   FERRITIN  5 - 200 ng/mL 172   Comment: Circulating ferritin <10 ng/mL suggests iron deficiency anemia.   Resulting Agency [facility]              Specimen Collected: 05/01/23 12:10 Last Resulted: 05/01/23 13:31               FOLATE  Order: [id number]   Status: Final result       Visible to patient: Yes (seen)       Dx: Chronic anemia    1 Result Note      Component  Ref Range & Units 5 d ago   FOLATE  7.0 - 31.0 ng/mL 7.6   Comment: Folate deficiency is typically associated with serum levels <3.5 ng/mL.   Resulting Agency [facility]              Specimen Collected: 05/01/23 12:10 Last Resulted: 05/01/23 13:31               VITAMIN B12  Order: [id number]   Status: Final result       Visible to patient: Yes (seen)       Dx: Chronic anemia    1 Result Note      Component  Ref Range & Units 5 d ago   VITAMIN B 12  200 - 900 pg/mL 220   Resulting Agency [facility]              Specimen Collected: 05/01/23 12:10 Last Resulted: 05/01/23 13:31

## 2023-05-07 ENCOUNTER — Other Ambulatory Visit: Payer: Self-pay

## 2023-05-07 ENCOUNTER — Encounter (HOSPITAL_BASED_OUTPATIENT_CLINIC_OR_DEPARTMENT_OTHER): Payer: Self-pay | Admitting: Nephrology

## 2023-05-07 ENCOUNTER — Ambulatory Visit: Payer: Medicare PPO | Attending: Nephrology | Admitting: Nephrology

## 2023-05-07 VITALS — BP 155/76 | HR 57 | Temp 97.5°F | Ht 64.0 in | Wt 215.2 lb

## 2023-05-07 DIAGNOSIS — N183 Chronic kidney disease, stage 3 unspecified: Secondary | ICD-10-CM | POA: Insufficient documentation

## 2023-05-07 DIAGNOSIS — N189 Chronic kidney disease, unspecified: Secondary | ICD-10-CM | POA: Insufficient documentation

## 2023-05-07 DIAGNOSIS — M353 Polymyalgia rheumatica: Secondary | ICD-10-CM

## 2023-05-07 DIAGNOSIS — E785 Hyperlipidemia, unspecified: Secondary | ICD-10-CM

## 2023-05-07 DIAGNOSIS — I129 Hypertensive chronic kidney disease with stage 1 through stage 4 chronic kidney disease, or unspecified chronic kidney disease: Secondary | ICD-10-CM

## 2023-05-07 DIAGNOSIS — I10 Essential (primary) hypertension: Secondary | ICD-10-CM | POA: Insufficient documentation

## 2023-05-07 DIAGNOSIS — E119 Type 2 diabetes mellitus without complications: Secondary | ICD-10-CM | POA: Insufficient documentation

## 2023-05-07 DIAGNOSIS — M316 Other giant cell arteritis: Secondary | ICD-10-CM

## 2023-05-07 DIAGNOSIS — E1122 Type 2 diabetes mellitus with diabetic chronic kidney disease: Secondary | ICD-10-CM

## 2023-05-07 DIAGNOSIS — E7800 Pure hypercholesterolemia, unspecified: Secondary | ICD-10-CM | POA: Insufficient documentation

## 2023-05-07 NOTE — H&P (Signed)
NEPHROLOGY, PHYSICIAN OFFICE BUILDING  [address]  [place] [address]  Operated by [facility]  History and Physical    Name: Joanna Black MRN:  [id number]   Date: 05/07/2023 DOB:  Jun 12, 1946 (76 y.o.)           Reason for Visit: New Patient and Chronic Kidney Disease    History of Present Illness  Joanna Black is a 77 y.o. 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,LAST A1C 7.4% HAS HYPERLIPIDEMIA REFERRED FOR ABNORMAL RENAL FUNCTION,HAS BEEN USING NAPROSYN UNTIL RECENTLY    Past Medical History:   Diagnosis Date    Arthritis     Asthma     Back problem     BMI 35.0-35.9,adult 02/16/2020    CAD (coronary artery disease)     mild    Cataract     Cataracts, bilateral     CHF (congestive heart failure) (CMS HCC)     Chronic bronchitis with emphysema (CMS HCC)     Chronic low back pain     Claustrophobia     Depression     Diabetes (CMS HCC)     Diabetes mellitus, type 2 (CMS HCC)     Encounter for support and coordination of transition of care 11/16/2022    Esophageal reflux     Essential hypertension 01/28/2019    H/O urinary tract infection     HH (hiatus hernia)     History of kidney disease     states, "low kidney functions."    Hypercholesterolemia     Hypertension     Hypertriglyceridemia     Type 2 diabetes mellitus (CMS HCC)          Past Surgical History:   Procedure Laterality Date    CATARACT EXTRACTION, BILATERAL Bilateral     bilateral lens implant    COLONOSCOPY      ENDOSCOPIC ULTRASOUND ESOPHGEAL  04/15/2023    ESOPHAGOGASTRODUODENOSCOPY      HX CARPAL TUNNEL RELEASE      HX CATARACT REMOVAL Right 12/30/2019    HX CATARACT REMOVAL Left 01/06/2020    HX CHOLECYSTECTOMY      HX EXPOSURE TO METAL SHAVINGS      HX HEART CATHETERIZATION      HX HYSTERECTOMY      HX OOPHORECTOMY      HX TAH AND BSO      HX TUBAL LIGATION      HX VEIN STRIPPING      Left leg         Current  Outpatient Medications   Medication Sig    acetaminophen (TYLENOL) 500 mg Oral Tablet Take 1 Tablet (500 mg total) by mouth Every night as needed Takes 2 at night    albuterol sulfate (PROVENTIL OR VENTOLIN OR PROAIR) 90 mcg/actuation Inhalation oral inhaler Take 1-2 Puffs by inhalation Every 6 hours as needed    albuterol sulfate (PROVENTIL) 2.5 mg /3 mL (0.083 %) Inhalation nebulizer solution Take 3 mL (2.5 mg total) by nebulization Every 4 hours as needed for Wheezing    aspirin (ECOTRIN) 81 mg Oral Tablet, Delayed Release (E.C.) Take 1 Tablet (81 mg total) by mouth Once a day    calcium carbonate/vitamin D3 (VITAMIN D-3 ORAL) Take 2,000 Int'l Units/day by mouth Once a day    ezetimibe (ZETIA) 10 mg Oral Tablet Take 1 Tablet (10  mg total) by mouth Every evening    ferrous sulfate (FEOSOL) 325 mg (65 mg iron) Oral Tablet Take 1 Tablet (325 mg total) by mouth Once a day For 3 months    FLUoxetine (PROZAC) 20 mg Oral Capsule Take 1 Capsule (20 mg total) by mouth Once a day    furosemide (LASIX) 20 mg Oral Tablet Take 1 Tablet (20 mg total) by mouth Twice per day as needed    magnesium oxide (MAG-OX) 400 mg Oral Tablet Take 1 Tablet (400 mg total) by mouth Three times a day    metoprolol succinate (TOPROL-XL) 25 mg Oral Tablet Sustained Release 24 hr Take 1 Tablet (25 mg total) by mouth Every night    omeprazole (PRILOSEC) 20 mg Oral Capsule, Delayed Release(E.C.) Take 1 Capsule (20 mg total) by mouth Once a day    predniSONE (DELTASONE) 1 mg Oral Tablet Take 3 tablets daily for 21 days, then alternate daily doses of 2 tabs and 3 tabs for 21 days, then start 2 tabs of prednisone daily for 21 days    tiZANidine (ZANAFLEX) 4 mg Oral Tablet TAKE 1 TABLET BY MOUTH THREE TIMES A DAY AS NEEDED FOR MUSCLE CRAMPS STRENGTH: 4 MG    TRULICITY 0.75 mg/0.5 mL Subcutaneous Pen Injector INJECT 0.5 ML (0.75 MG) UNDER  THE SKIN EVERY 7 DAYS (Patient taking differently: sundays)     Allergies   Allergen Reactions    Amoxicillin   Other Adverse Reaction (Add comment)     Unknown reaction    Augmentin [Amoxicillin-Pot Clavulanate] Nausea/ Vomiting    Statins-Hmg-Coa Reductase Inhibitors Myalgia     Family Medical History:       Problem Relation (Age of Onset)    Bone cancer Father    Diabetes Multiple family members    Hypertension (High Blood Pressure) Multiple family members    No Known Problems Mother    Stomach Cancer Paternal Grandmother            Social History     Tobacco Use    Smoking status: Never     Passive exposure: Never    Smokeless tobacco: Never   Vaping Use    Vaping status: Never Used   Substance Use Topics    Alcohol use: No     Alcohol/week: 0.0 standard drinks of alcohol    Drug use: No       Review of Systems  Other than ROS in the HPI, all other systems were negative.    Physical Exam:  BP (!) 155/76   Pulse 57   Temp 36.4 C (97.5 F)   Ht 1.626 m (5\' 4" )   Wt 97.6 kg (215 lb 2.7 oz)   SpO2 96%   BMI 36.93 kg/m       Constitutional: appears chronically ill, moderately obese, and no distress  Eyes: Conjunctiva clear.  ENT: Head atraumatic and normocephalic  Neck: no thyromegaly or lymphadenopathy  Respiratory: Clear to auscultation bilaterally.   Cardiovascular: S1, S2 normal  Gastrointestinal: non-distended  Genitourinary: Deferred  Musculoskeletal: Head atraumatic and normocephalic  Integumentary:  Skin warm and dry  Neurologic: Grossly normal. 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    Donavan Burnet, MD

## 2023-05-09 ENCOUNTER — Other Ambulatory Visit (INDEPENDENT_AMBULATORY_CARE_PROVIDER_SITE_OTHER): Payer: Self-pay | Admitting: Family Medicine

## 2023-05-09 ENCOUNTER — Telehealth (INDEPENDENT_AMBULATORY_CARE_PROVIDER_SITE_OTHER): Payer: Self-pay | Admitting: Vascular Surgery

## 2023-05-09 DIAGNOSIS — M316 Other giant cell arteritis: Secondary | ICD-10-CM

## 2023-05-09 NOTE — Progress Notes (Signed)
[facility]  [address]  [place] [address]    Telephone Visit    Name:  MAIYAH LUBAS MRN: [id number]   Date:  05/09/2023 Age:   77 y.o.     The patient/family initiated a request for telephone service.  Verbal consent for this service was obtained from the patient/family.    Last office visit in this department: 02/10/2023      Reason for call:  Results  Call notes:  77 year old female received a seen in our office for concerns for giant cell arteritis.  She was sent for upper extremity and lower extremity arterial studies.  Those demonstrate the following:   Summary       PVR Upper Extremity at REST             No data to display                              04/23/2023     1:18 PM   Rt Segmental BP   Brachial Pressure mmHg 165   Wrist (RA) Pressure mmHg 158   Wrist (RA) Index 0.96   Wrist (UA) Pressure mmHg 160   Wrist (UA) Index 0.97                 04/23/2023     1:18 PM   Lt Segmental Bp   Brachial Pressure mmHg 155   Wrist (RA) Pressure mmHg 138   Wrist (RA) Index 0.84   Wrist (UA) Pressure mmHg 149   Wrist (UA) Index 0.9               Waveforms available in HPF.     Performing Tech:  Abbe Amsterdam, RDMS,RVT 04/23/2023, 13:19     BILATERAL UPPER EXTREMITY NON INVASIVE ARTERIAL DOPPLER STUDY-      Bilateral Upper extremity noninvasive physiologic arterial evaluation with segmental  pressures, ulnar brachial and radial brachial indices and PVR waveform analysis of the upper extremities was  performed at rest.  Ulnar-brachial indices, radial brachial indices,  digital platysmal graphic evaluation of the digits and also segmental  digital platysmal graphic evaluation of the upper extremities was  performed. Segmental pressures were also obtained.      FINDINGS-  All waveforms were multiphasic.  Ulnar brachial index, radial brachial index and PVR waveforms were normal bilaterally     ASSESSMENT-  No hemodynamically Significant Arterial Disease in both upper extremeties at  rest  Clinical correlation Suggested   Summary    Lower Extremity Pulse Volume Recordings at Rest          04/23/2023     1:16 PM   Rt Segmental BP   Right Brachial Pressure mmHg 159   Right Low Thigh Pressure mmHg 226   Right Low Thigh Index 1.35   Right Calf Pressure mmHg 244   Right Calf Index 1.45   Right Ankle (PT) Pressure mmHg 210   Right Ankle (PT) Index 1.25   Right Ankle (DP) Pressure mmHg 203   Right Ankle (DP) Index 1.21                 04/23/2023     1:16 PM   Lt Segmental BP   Left Brachial Pressure mmHg 168   Left Low Thigh Pressure mmHg 237   Left Low Thigh Index 1.41   Left Calf Pressure mmHg 209   Left  Calf Index 1.24   Left Ankle (PT) Pressure mmHg 212   Left Ankle (PT) Index 1.26   Left Ankle (DP) Pressure mmHg 192   Left Ankle (DP) Index 1.14            Waveforms available in HPF.     Performing Tech:  Abbe Amsterdam, RDMS,RVT 04/23/2023, 13:17     BILATERAL LOWER EXTREMITY NON INVASIVE ARTERIAL DOPPLER STUDY-      Bilateral lower extremity noninvasive physiologic arterial evaluation with segmental  pressures, ABIs and PVR waveform analysis of the lower extremities was  performed at rest. Ankle-brachial indices, toe brachial indices,  digital plethysmographic evaluation of the toes and also segmental  digital plethysmographic evaluation of the lower extremities was  performed. Segmental pressures were also obtained.      FINDINGS-  The Doppler waveforms were within normal limits bilaterally.  The ABI and PVR waveforms were also within normal limits bilaterally     ASSESSMENT-  There is no hemodynamically significant Arterial Disease in both lower extremeties at rest       Clinical correlation Suggested    The patient continues to have symptoms of giant cell arteritis despite being on prednisone taper.  She reports that she was decreased to 2 mg of prednisone and had symptoms.  She was restarted on 3 mg prednisone.  Her daughter is on the call with her and expresses that she has chronic kidney disease  and has an upcoming CT angiogram head and neck for evaluation of large cell arteritis.  We discussed the options of pre admission for hydration prior to the CT, she is agreeable.      ICD-10-CM    1. 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    Jonn Shingles, NP

## 2023-05-13 ENCOUNTER — Ambulatory Visit: Payer: Medicare PPO | Attending: Urology | Admitting: Urology

## 2023-05-13 ENCOUNTER — Other Ambulatory Visit: Payer: Self-pay

## 2023-05-13 DIAGNOSIS — N3946 Mixed incontinence: Secondary | ICD-10-CM | POA: Insufficient documentation

## 2023-05-13 DIAGNOSIS — N390 Urinary tract infection, site not specified: Secondary | ICD-10-CM | POA: Insufficient documentation

## 2023-05-13 DIAGNOSIS — R32 Unspecified urinary incontinence: Secondary | ICD-10-CM

## 2023-05-13 MED ORDER — TRIMETHOPRIM 100 MG TABLET
100.0000 mg | ORAL_TABLET | Freq: Every day | ORAL | 3 refills | Status: DC
Start: 2023-05-13 — End: 2024-02-20

## 2023-05-13 NOTE — Procedures (Signed)
[facility]  [address]  [place] [facility] [address]    Procedure Note    Name: Joanna Black MRN:  [id number]   Date: 05/13/2023 DOB:  1946/04/17 (76 y.o.)         81001 - URINALYSIS, AUTOMATED W/ MICROSCOPY (AMB ONLY)    Performed by: Girtha Hake, MD  Authorized by: Girtha Hake, MD    Documentation:      100 white blood cells per high-powered field   Otherwise clear  Creatinine 200      Girtha Hake, MD

## 2023-05-13 NOTE — Nursing Note (Signed)
Bladder Scan - 1 ml

## 2023-05-13 NOTE — Progress Notes (Signed)
[facility]  [address]  [address]  [place] [address]  [phone]   Urology Progress Note    Date of Service:  05/13/2023  Joanna, Black, 77 y.o. female  Date of Birth:  1946/10/01  PCP: Genene Churn, DO  Referring:  Genene Churn     HPI:  Joanna Black is a 77 y.o. White female has 2 separate urologic issues.    1. She has been having a lot of recurrent urinary tract infections had 2 infections within last 3 months.  Even though she does have some white cells in her urine today she really feels like she is uninfected right at this time.  I am going to go ahead and immediately start her on trimethoprim 100 mg a day which should take care of that issue.    2. She is having mixed urinary incontinence although the urgency component seems to be the worst part.  She has had 3 vaginal deliveries and hysterectomy 40 years ago.  BladderScan today shows 1 cc.  We will plan to go ahead with cystoscopy with Q-tip test [facility] June 3rd.  Hopefully we can resolve most of the urgency component.    Past Medical History:   Diagnosis Date    Arthritis     Asthma     Back problem     BMI 35.0-35.9,adult 02/16/2020    CAD (coronary artery disease)     mild    Cataract     Cataracts, bilateral     CHF (congestive heart failure) (CMS HCC)     Chronic bronchitis with emphysema (CMS HCC)     Chronic low back pain     Claustrophobia     Depression     Diabetes (CMS HCC)     Diabetes mellitus, type 2 (CMS HCC)     Encounter for support and coordination of transition of care 11/16/2022    Esophageal reflux     Essential hypertension 01/28/2019    H/O urinary tract infection     HH (hiatus hernia)     History of kidney disease     states, "low kidney functions."    Hypercholesterolemia     Hypertension     Hypertriglyceridemia     Type 2 diabetes mellitus (CMS HCC)       Past Surgical History:   Procedure Laterality Date    CATARACT EXTRACTION, BILATERAL  Bilateral     bilateral lens implant    COLONOSCOPY      ENDOSCOPIC ULTRASOUND ESOPHGEAL  04/15/2023    ESOPHAGOGASTRODUODENOSCOPY      HX CARPAL TUNNEL RELEASE      HX CATARACT REMOVAL Right 12/30/2019    HX CATARACT REMOVAL Left 01/06/2020    HX CHOLECYSTECTOMY      HX EXPOSURE TO METAL SHAVINGS      HX HEART CATHETERIZATION      HX HYSTERECTOMY      HX OOPHORECTOMY      HX TAH AND BSO      HX TUBAL LIGATION      HX VEIN STRIPPING      Left leg      Social History     Tobacco Use    Smoking status: Never     Passive exposure: Never    Smokeless tobacco: Never   Vaping Use    Vaping status: Never Used   Substance Use Topics    Alcohol use: No  Alcohol/week: 0.0 standard drinks of alcohol    Drug use: No       Family Medical History:       Problem Relation (Age of Onset)    Bone cancer Father    Diabetes Multiple family members    Hypertension (High Blood Pressure) Multiple family members    No Known Problems Mother    Stomach Cancer Paternal Grandmother           Outpatient Medications Marked as Taking for the 05/13/23 encounter (Office Visit) with Girtha Hake, MD   Medication Sig    aspirin (ECOTRIN) 81 mg Oral Tablet, Delayed Release (E.C.) Take 1 Tablet (81 mg total) by mouth Once a day    calcium carbonate/vitamin D3 (VITAMIN D-3 ORAL) Take 2,000 Int'l Units/day by mouth Once a day    ezetimibe (ZETIA) 10 mg Oral Tablet Take 1 Tablet (10 mg total) by mouth Every evening    ferrous sulfate (FEOSOL) 325 mg (65 mg iron) Oral Tablet Take 1 Tablet (325 mg total) by mouth Once a day For 3 months    FLUoxetine (PROZAC) 20 mg Oral Capsule Take 1 Capsule (20 mg total) by mouth Once a day    magnesium oxide (MAG-OX) 400 mg Oral Tablet Take 1 Tablet (400 mg total) by mouth Three times a day    metoprolol succinate (TOPROL-XL) 25 mg Oral Tablet Sustained Release 24 hr Take 1 Tablet (25 mg total) by mouth Every night    omeprazole (PRILOSEC) 20 mg Oral Capsule, Delayed Release(E.C.) Take 1 Capsule (20 mg total) by  mouth Once a day    predniSONE (DELTASONE) 1 mg Oral Tablet Take 3 tablets daily for 21 days, then alternate daily doses of 2 tabs and 3 tabs for 21 days, then start 2 tabs of prednisone daily for 21 days    tiZANidine (ZANAFLEX) 4 mg Oral Tablet TAKE 1 TABLET BY MOUTH THREE TIMES A DAY AS NEEDED FOR MUSCLE CRAMPS STRENGTH: 4 MG    trimethoprim (TRIMPEX) 100 mg Oral Tablet Take 1 Tablet (100 mg total) by mouth Once a day    TRULICITY 0.75 mg/0.5 mL Subcutaneous Pen Injector INJECT 0.5 ML SUBCUTANEOUSLY  EVERY 7 DAYS      Allergies   Allergen Reactions    Amoxicillin  Other Adverse Reaction (Add comment)     Unknown reaction    Augmentin [Amoxicillin-Pot Clavulanate] Nausea/ Vomiting    Statins-Hmg-Coa Reductase Inhibitors Myalgia        Review of Systems   Constitutional:  Negative for chills, fever and malaise/fatigue.   Eyes:  Negative for blurred vision.   Respiratory:  Negative for shortness of breath and wheezing.    Cardiovascular:  Negative for chest pain.   Gastrointestinal:  Negative for abdominal pain, nausea and vomiting.   Genitourinary:  Negative for dysuria, flank pain, frequency, hematuria and urgency.        Mixed urinary incontinence   Recurrent urinary tract infections   Musculoskeletal:  Negative for back pain.   Skin: Negative.    Neurological:  Negative for weakness.   Psychiatric/Behavioral:  Negative for memory loss.           There were no vitals taken for this visit.         Physical Exam  Vitals and nursing note reviewed.   Eyes:      Conjunctiva/sclera: Conjunctivae normal.   Pulmonary:      Effort: Pulmonary effort is normal. No respiratory distress.  Breath sounds: Normal breath sounds. No wheezing or rales.   Chest:      Chest wall: No tenderness.   Abdominal:      General: Bowel sounds are normal. There is no distension.      Palpations: Abdomen is soft. There is no mass.      Tenderness: There is no abdominal tenderness. There is no guarding or rebound.      Comments: Negative for  flank pain.     Musculoskeletal:      Cervical back: Normal range of motion.      Comments: Negative for low back pain.     Skin:     General: Skin is warm and dry.   Neurological:      Mental Status: She is alert and oriented to person, place, and time.   Psychiatric:         Mood and Affect: Mood and affect normal.          Data Review:  Pertinent laboratory data and imaging studies reviewed.          [facility]  [address]  [facility] [address]    Procedure Note    Name: Joanna Black MRN:  [id number]   Date: 05/13/2023 DOB:  [birth year] (76 y.o.)         81001 - URINALYSIS, AUTOMATED W/ MICROSCOPY (AMB ONLY)    Performed by: Girtha Hake, MD  Authorized by: Girtha Hake, MD    Documentation:      100 white blood cells per high-powered field   Otherwise clear  Creatinine 200      Girtha Hake, MD    Assessment  Assessment/Plan   1. Urinary incontinence    2. Recurrent urinary tract infection       1. Recurrent urinary tract infections  2. 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 Start trimethoprim 100 mg a day  2. Cystoscopy with Q-tip test [facility] June 3rd    Return in about 13 days (around 05/26/2023).     Patient was seen and evaluated and findings were communicated with referring physician.    Girtha Hake, MD

## 2023-05-14 ENCOUNTER — Encounter (INDEPENDENT_AMBULATORY_CARE_PROVIDER_SITE_OTHER): Payer: Self-pay | Admitting: Student in an Organized Health Care Education/Training Program

## 2023-05-15 ENCOUNTER — Other Ambulatory Visit (INDEPENDENT_AMBULATORY_CARE_PROVIDER_SITE_OTHER): Payer: Self-pay | Admitting: Student in an Organized Health Care Education/Training Program

## 2023-05-15 DIAGNOSIS — M545 Low back pain, unspecified: Secondary | ICD-10-CM

## 2023-05-15 DIAGNOSIS — R7982 Elevated C-reactive protein (CRP): Secondary | ICD-10-CM

## 2023-05-16 ENCOUNTER — Other Ambulatory Visit: Payer: Self-pay

## 2023-05-16 ENCOUNTER — Telehealth (INDEPENDENT_AMBULATORY_CARE_PROVIDER_SITE_OTHER): Payer: Self-pay | Admitting: Student in an Organized Health Care Education/Training Program

## 2023-05-16 ENCOUNTER — Other Ambulatory Visit: Payer: Medicare PPO | Attending: Family Medicine

## 2023-05-16 DIAGNOSIS — M545 Low back pain, unspecified: Secondary | ICD-10-CM

## 2023-05-16 DIAGNOSIS — R7982 Elevated C-reactive protein (CRP): Secondary | ICD-10-CM

## 2023-05-16 LAB — SEDIMENTATION RATE: ERYTHROCYTE SEDIMENTATION RATE (ESR): 24 mm/hr (ref <39)

## 2023-05-16 LAB — PROCALCITONIN: PROCALCITONIN: 0.06 ng/mL (ref <0.50)

## 2023-05-16 LAB — C-REACTIVE PROTEIN(CRP),INFLAMMATION: CRP INFLAMMATION: 104.2 mg/L — ABNORMAL HIGH (ref <8.0)

## 2023-05-16 NOTE — Telephone Encounter (Signed)
Per automated system at [facility], [phone], CPT [phone] MRI Lumbar Spine Combo with diagnosis code R 26.2 does NOT require a prior auth with the last 4 digits of the reference #7504 at all places of service  Baker Pierini, RN  05/16/23,  3:32 PM

## 2023-05-21 ENCOUNTER — Inpatient Hospital Stay
Admission: RE | Admit: 2023-05-21 | Discharge: 2023-05-21 | Disposition: A | Discharge status: Home / Self Care | Payer: Medicare PPO | Source: Ambulatory Visit | Attending: Student in an Organized Health Care Education/Training Program | Admitting: Student in an Organized Health Care Education/Training Program

## 2023-05-21 ENCOUNTER — Other Ambulatory Visit: Payer: Self-pay

## 2023-05-21 ENCOUNTER — Other Ambulatory Visit (INDEPENDENT_AMBULATORY_CARE_PROVIDER_SITE_OTHER): Payer: Self-pay | Admitting: Student in an Organized Health Care Education/Training Program

## 2023-05-21 DIAGNOSIS — R262 Difficulty in walking, not elsewhere classified: Secondary | ICD-10-CM

## 2023-05-22 ENCOUNTER — Telehealth (INDEPENDENT_AMBULATORY_CARE_PROVIDER_SITE_OTHER): Payer: Self-pay | Admitting: Student in an Organized Health Care Education/Training Program

## 2023-05-22 DIAGNOSIS — M545 Low back pain, unspecified: Secondary | ICD-10-CM

## 2023-05-22 DIAGNOSIS — M5410 Radiculopathy, site unspecified: Secondary | ICD-10-CM

## 2023-05-26 ENCOUNTER — Encounter (HOSPITAL_COMMUNITY): Payer: Medicare PPO | Admitting: Urology

## 2023-05-26 ENCOUNTER — Encounter (HOSPITAL_COMMUNITY)
Admission: RE | Disposition: A | Discharge status: Home / Self Care | Payer: Self-pay | Source: Ambulatory Visit | Attending: Urology

## 2023-05-26 ENCOUNTER — Inpatient Hospital Stay
Admission: RE | Admit: 2023-05-26 | Discharge: 2023-05-26 | Disposition: A | Discharge status: Home / Self Care | Payer: Medicare PPO | Source: Ambulatory Visit | Attending: Urology | Admitting: Urology

## 2023-05-26 ENCOUNTER — Other Ambulatory Visit: Payer: Self-pay

## 2023-05-26 DIAGNOSIS — N3030 Trigonitis without hematuria: Secondary | ICD-10-CM | POA: Insufficient documentation

## 2023-05-26 DIAGNOSIS — N3592 Unspecified urethral stricture, female: Secondary | ICD-10-CM | POA: Insufficient documentation

## 2023-05-26 DIAGNOSIS — N3946 Mixed incontinence: Secondary | ICD-10-CM | POA: Insufficient documentation

## 2023-05-26 DIAGNOSIS — N3641 Hypermobility of urethra: Secondary | ICD-10-CM | POA: Insufficient documentation

## 2023-05-26 DIAGNOSIS — N368 Other specified disorders of urethra: Secondary | ICD-10-CM | POA: Insufficient documentation

## 2023-05-26 SURGERY — CYSTOSCOPY
Anesthesia: Local (Nurse-Monitored) | Wound class: Clean Contaminated Wounds-The respiratory, GI, Genital, or urinary

## 2023-05-26 MED ORDER — LIDOCAINE 2 % MUCOSAL JELLY IN APPLICATOR
Freq: Once | Status: DC
Start: 2023-05-26 — End: 2023-05-26
  Filled 2023-05-26: qty 5

## 2023-05-26 MED ORDER — CONJUGATED ESTROGENS 0.625 MG/GRAM VAGINAL CREAM
0.5000 g | TOPICAL_CREAM | Freq: Every evening | VAGINAL | 5 refills | Status: DC
Start: 2023-05-26 — End: 2024-04-13

## 2023-05-26 MED ORDER — LIDOCAINE 2 % MUCOSAL JELLY IN APPLICATOR
Freq: Once | Status: DC
Start: 2023-05-26 — End: 2023-05-26
  Administered 2023-05-26: 0 mL via URETHRAL
  Filled 2023-05-26: qty 5

## 2023-05-26 MED ORDER — LIDOCAINE 2 % MUCOSAL JELLY IN APPLICATOR
Freq: Once | Status: DC | PRN
Start: 2023-05-26 — End: 2023-05-26
  Administered 2023-05-26: 5 mL via URETHRAL

## 2023-05-26 MED ORDER — CIPROFLOXACIN 250 MG TABLET
250.0000 mg | ORAL_TABLET | Freq: Two times a day (BID) | ORAL | 0 refills | Status: DC
Start: 2023-05-26 — End: 2023-07-14

## 2023-05-26 SURGICAL SUPPLY — 7 items
CONV USE ITEM 321863 - GLOVE SURG 6.5 LF PF SMTH BE_AD CUF STRL NTR 12IN PROTEXIS (GLOVES AND ACCESSORIES) ×3 IMPLANT
SET 81IN REG CLAMP N-PYRG IRRG 10 GTT/ML STR CSCP DEHP BLADDER STRL LF (MED SURG SUPPLIES) ×1 IMPLANT
SOL IV 0.9% NACL 500ML PLASTIC CONTAINR VIAFLEX LF (MEDICATIONS/SOLUTIONS) ×1 IMPLANT
SOL PREP BDINE 10% PVP 4OZ BTL PREOP SKIN PREP (MED SURG SUPPLIES) ×1 IMPLANT
SPONGE GAUZE 4X4IN MDCHC COTTON 12 PLY TY 7 LF  STRL DISP (WOUND CARE SUPPLY) ×1 IMPLANT
SPONGE LAP 18X18IN STRL (MED SURG SUPPLIES) ×1 IMPLANT
TOWEL 27X17IN COTTON STD PREWASH DELINT HI ABS BLU DISP SURG STRL LF (DRAPE/PACKS/SHEETS/OR TOWEL) ×1 IMPLANT

## 2023-05-26 NOTE — Discharge Instructions (Signed)
SURGICAL DISCHARGE INSTRUCTIONS     Dr. Meriwether, David F, MD  performed your CYSTOSCOPY today at the [facility]    [facility]   Monday through Thursday 8:00 am  Through 4:30 pm  [phone]    PLEASE SEE WRITTEN HANDOUTS AS DISCUSSED BY YOUR NURSE:  .    SIGNS AND SYMPTOMS OF A WOUND / INCISION INFECTION   Be sure to watch for the following:  Increase in redness or red streaks near or around the wound or incision.  Increase in pain that is intense or severe and cannot be relieved by the pain medication that your doctor has given you.  Increase in swelling that cannot be relieved by elevation of a body part, or by applying ice, if permitted.  Increase in drainage, or if yellow / green in color and smells bad. This could be on a dressing or a cast.  Increase in fever for longer than 24 hours, or an increase that is higher than 101 degrees Fahrenheit (normal body temperature is 98 degrees Fahrenheit). The incision may feel warm to the touch.    **CALL YOUR DOCTOR IF ONE OR MORE OF THESE SIGNS / SYMPTOMS SHOULD OCCUR.    ANESTHESIA INFORMATION   LOCAL ANESTHETIC:  You have receieved a local anesthetic, the effects should disappear in a few hours.    REMEMBER   If you experience any difficulty breathing, chest pain, bleeding that you feel is excessive, persistent nausea or vomiting or for any other concerns:  Call your physician Dr.  Meriwether, David F, MD    [facility]  at [phone] or [phone] You may also ask to have the general doctor on call paged. They are available to you 24 hours a day.    SPECIAL INSTRUCTIONS / COMMENTS       FOLLOW-UP APPOINTMENTS   Please call your surgeon's office at the number listed about  to schedule a date / time of return.

## 2023-05-26 NOTE — H&P (Signed)
[facility]  Outpatient Surgery History & Physical    Date of Service:  05/26/2023  Shiraz, Hieronymus, 77 y.o. female  Date of Admission:  05/26/2023   Date of Birth:  06-Mar-1946  PCP: Genene Churn, DO    Chief Complaint:  Mixed urinary incontinence mostly urgency    Past History:    Past Medical History:   Diagnosis Date    Arthritis     Asthma     Back problem     BMI 35.0-35.9,adult 02/16/2020    CAD (coronary artery disease)     mild    Cataract     Cataracts, bilateral     CHF (congestive heart failure) (CMS HCC)     Chronic bronchitis with emphysema (CMS HCC)     Chronic low back pain     Claustrophobia     Depression     Diabetes (CMS HCC)     Diabetes mellitus, type 2 (CMS HCC)     Encounter for support and coordination of transition of care 11/16/2022    Esophageal reflux     Essential hypertension 01/28/2019    H/O urinary tract infection     HH (hiatus hernia)     History of kidney disease     states, "low kidney functions."    Hypercholesterolemia     Hypertension     Hypertriglyceridemia     Type 2 diabetes mellitus (CMS HCC)      Past Surgical History:   Procedure Laterality Date    CATARACT EXTRACTION, BILATERAL Bilateral     bilateral lens implant    COLONOSCOPY      ENDOSCOPIC ULTRASOUND ESOPHGEAL  04/15/2023    ESOPHAGOGASTRODUODENOSCOPY      HX CARPAL TUNNEL RELEASE      HX CATARACT REMOVAL Right 12/30/2019    HX CATARACT REMOVAL Left 01/06/2020    HX CHOLECYSTECTOMY      HX EXPOSURE TO METAL SHAVINGS      HX HEART CATHETERIZATION      HX HYSTERECTOMY      HX OOPHORECTOMY      HX TAH AND BSO      HX TUBAL LIGATION      HX VEIN STRIPPING      Left leg       Allergies:    Allergies   Allergen Reactions    Amoxicillin  Other Adverse Reaction (Add comment)     Unknown reaction    Augmentin [Amoxicillin-Pot Clavulanate] Nausea/ Vomiting    Statins-Hmg-Coa Reductase Inhibitors Myalgia        Medications:    No current outpatient medications on file.        Examination:                     Exam:    General:  Well appearing, no distress, alert and oriented  Chest:  Clear  Abdomen:  Soft, positive bowel, no tenderness  Flank:  Negative    Pre-Op Diagnosis:  Mixed urinary incontinence    Planned Procedure:  Cystoscopy with Q-tip    Girtha Hake, MD

## 2023-05-26 NOTE — OR Surgeon (Signed)
[facility]  OPERATIVE REPORT      Joanna Black, Joanna Black, 77 y.o. female  Date of Procedure:  05/26/23  Date of Birth:  [birth year]  Service: Urology  PCP:  Genene Churn, DO    Procedure Performed:  Cystoscopy with Q-tip    Pre-Operative Diagnosis:  Mixed urinary incontinence     Post-Operative Diagnosis: Same    Pertinent Operative Findings:  1. Mild urethrovesical hypermobility of about 30  2. Chronic trigonitis   3. Urethral atrophy      Attending Surgeon: Girtha Hake, MD     Anesthesia Type: Local (Nurse-Monitored)    Estimated Blood Loss: None    Complications (not routinely expected or not inherent to difficulty/nature of procedure): None    Specimens:  None    Brief History:  This is a 36 year lady has had 3 vaginal deliveries and hysterectomy is having mixed urinary incontinence but most of it seems to be urgency.  She presents at this time for lower tract evaluation.           Description of Procedure:     Patient was taken procedure room she was placed in dorsal lithotomy position prepped Betadine and draped usual fashion.  Q-tip test shows down hypermobility of about 25-30 degrees.  No cystocele was noted.  The flexible cystoscope was passed per urethra into the bladder bladder was carefully examined all 4 quadrant areas.  Patient clearly has some chronic trigonitis in the bladder was not trabeculated.  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  After discussion were going to go ahead and treat her with 3 weeks of Cipro 250 mg twice a day and start her on external Premarin vaginal cream.  We will re-evaluate her systems in 3 weeks.    Girtha Hake, MD

## 2023-05-29 ENCOUNTER — Other Ambulatory Visit: Payer: Medicare PPO

## 2023-06-02 ENCOUNTER — Telehealth (INDEPENDENT_AMBULATORY_CARE_PROVIDER_SITE_OTHER): Payer: Self-pay | Admitting: Vascular & Interventional Radiology

## 2023-06-02 ENCOUNTER — Encounter (INDEPENDENT_AMBULATORY_CARE_PROVIDER_SITE_OTHER): Payer: Self-pay | Admitting: Vascular & Interventional Radiology

## 2023-06-18 ENCOUNTER — Other Ambulatory Visit (INDEPENDENT_AMBULATORY_CARE_PROVIDER_SITE_OTHER): Payer: Self-pay | Admitting: Family Medicine

## 2023-06-18 DIAGNOSIS — M62838 Other muscle spasm: Secondary | ICD-10-CM

## 2023-06-18 MED ORDER — TIZANIDINE 4 MG TABLET
ORAL_TABLET | ORAL | 1 refills | Status: DC
Start: 2023-06-18 — End: 2024-02-04

## 2023-07-02 ENCOUNTER — Encounter (HOSPITAL_BASED_OUTPATIENT_CLINIC_OR_DEPARTMENT_OTHER): Payer: Self-pay | Admitting: Student in an Organized Health Care Education/Training Program

## 2023-07-03 ENCOUNTER — Encounter (INDEPENDENT_AMBULATORY_CARE_PROVIDER_SITE_OTHER): Payer: Self-pay | Admitting: Student in an Organized Health Care Education/Training Program

## 2023-07-03 ENCOUNTER — Other Ambulatory Visit (INDEPENDENT_AMBULATORY_CARE_PROVIDER_SITE_OTHER): Payer: Self-pay | Admitting: Family Medicine

## 2023-07-03 ENCOUNTER — Other Ambulatory Visit (HOSPITAL_BASED_OUTPATIENT_CLINIC_OR_DEPARTMENT_OTHER): Payer: Self-pay | Admitting: Student in an Organized Health Care Education/Training Program

## 2023-07-03 DIAGNOSIS — J4540 Moderate persistent asthma, uncomplicated: Secondary | ICD-10-CM

## 2023-07-03 DIAGNOSIS — E1159 Type 2 diabetes mellitus with other circulatory complications: Secondary | ICD-10-CM

## 2023-07-04 ENCOUNTER — Other Ambulatory Visit (INDEPENDENT_AMBULATORY_CARE_PROVIDER_SITE_OTHER): Payer: Self-pay | Admitting: Student in an Organized Health Care Education/Training Program

## 2023-07-04 DIAGNOSIS — M353 Polymyalgia rheumatica: Secondary | ICD-10-CM

## 2023-07-04 MED ORDER — PREDNISONE 5 MG TABLET
5.0000 mg | ORAL_TABLET | Freq: Every day | ORAL | 0 refills | Status: DC
Start: 2023-07-04 — End: 2023-08-14

## 2023-07-04 NOTE — Telephone Encounter (Signed)
Schedule next available appointment.

## 2023-07-14 ENCOUNTER — Other Ambulatory Visit: Payer: Self-pay

## 2023-07-14 ENCOUNTER — Encounter (INDEPENDENT_AMBULATORY_CARE_PROVIDER_SITE_OTHER): Payer: Self-pay | Admitting: Family Medicine

## 2023-07-14 ENCOUNTER — Ambulatory Visit: Payer: Medicare PPO | Attending: Family Medicine | Admitting: Family Medicine

## 2023-07-14 VITALS — BP 122/80 | HR 81 | Temp 97.8°F | Resp 18 | Ht 64.02 in | Wt 212.8 lb

## 2023-07-14 DIAGNOSIS — E1159 Type 2 diabetes mellitus with other circulatory complications: Secondary | ICD-10-CM | POA: Insufficient documentation

## 2023-07-14 DIAGNOSIS — Z1331 Encounter for screening for depression: Secondary | ICD-10-CM | POA: Insufficient documentation

## 2023-07-14 DIAGNOSIS — Z79899 Other long term (current) drug therapy: Secondary | ICD-10-CM | POA: Insufficient documentation

## 2023-07-14 DIAGNOSIS — N183 Chronic kidney disease, stage 3 unspecified: Secondary | ICD-10-CM | POA: Insufficient documentation

## 2023-07-14 DIAGNOSIS — I129 Hypertensive chronic kidney disease with stage 1 through stage 4 chronic kidney disease, or unspecified chronic kidney disease: Secondary | ICD-10-CM | POA: Insufficient documentation

## 2023-07-14 DIAGNOSIS — E1169 Type 2 diabetes mellitus with other specified complication: Secondary | ICD-10-CM | POA: Insufficient documentation

## 2023-07-14 DIAGNOSIS — Z7985 Long-term (current) use of injectable non-insulin antidiabetic drugs: Secondary | ICD-10-CM | POA: Insufficient documentation

## 2023-07-14 DIAGNOSIS — Z6836 Body mass index (BMI) 36.0-36.9, adult: Secondary | ICD-10-CM | POA: Insufficient documentation

## 2023-07-14 DIAGNOSIS — Z1211 Encounter for screening for malignant neoplasm of colon: Secondary | ICD-10-CM | POA: Insufficient documentation

## 2023-07-14 DIAGNOSIS — E119 Type 2 diabetes mellitus without complications: Secondary | ICD-10-CM | POA: Insufficient documentation

## 2023-07-14 DIAGNOSIS — I152 Hypertension secondary to endocrine disorders: Secondary | ICD-10-CM | POA: Insufficient documentation

## 2023-07-14 DIAGNOSIS — E785 Hyperlipidemia, unspecified: Secondary | ICD-10-CM | POA: Insufficient documentation

## 2023-07-14 NOTE — Progress Notes (Signed)
[facility]  [address]  [place], [place] [address]  P: [phone]  F: [phone]       NAME:  Joanna Black  DOB:  1946/11/23  AGE:  77 y.o.  MRN:  [id number]  APPT:  07/14/2023     Chief Complaint   Patient presents with    Follow Up 3 Months    Colon Cancer Screening     Would like to have done @ [facility] if possible, if not okay with [facility]      Subjective:     This is a case of a 77 y.o. year old female who comes in today for follow-up concerning history of type 2 diabetes, hypertension/chronic kidney disease, hyperlipidemia, and severe obesity.  Overall, patient states he is doing well.  During today's visit we discussed/reviewed her medications and most recent lab results.  Patient have updated labs drawn prior next follow-up appointment. Patient denies recent symptoms of fatigue, fever, chills, night sweats, chest pain, irregular heart beats, shortness of breath, abdominal pain, or change in bowel/bladder function.       Past Medical History:   Diagnosis Date    Arthritis     Asthma     Back problem     BMI 35.0-35.9,adult 02/16/2020    CAD (coronary artery disease)     mild    Cataract     Cataracts, bilateral     CHF (congestive heart failure) (CMS HCC)     Chronic bronchitis with emphysema (CMS HCC)     Chronic low back pain     Claustrophobia     Depression     Diabetes (CMS HCC)     Diabetes mellitus, type 2 (CMS HCC)     Encounter for support and coordination of transition of care 11/16/2022    Esophageal reflux     Essential hypertension 01/28/2019    H/O urinary tract infection     HH (hiatus hernia)     History of kidney disease     states, "low kidney functions."    Hypercholesterolemia     Hypertension     Hypertriglyceridemia     Type 2 diabetes mellitus (CMS HCC)          Past Surgical History:   Procedure Laterality Date    CATARACT EXTRACTION, BILATERAL Bilateral     bilateral lens implant    COLONOSCOPY      CYSTOSCOPY N/A 05/26/2023    Performed by Girtha Hake, MD at  [facility] OR MAIN    ENDOSCOPIC U/S UPPER with FNA N/A 04/15/2023    Performed by Edwin Cap, DO at [facility] OR ENDO    ENDOSCOPIC U/S UPPER with FNA N/A 05/10/2022    Performed by Edwin Cap, DO at [facility] OR ENDO    ENDOSCOPIC ULTRASOUND ESOPHGEAL  04/15/2023    ESOPHAGOGASTRODUODENOSCOPY      GASTROSCOPY  05/10/2022    Performed by Edwin Cap, DO at [facility] OR ENDO    GASTROSCOPY WITH DILATION Left 09/24/2022    Performed by Joslyn Devon, MD at [facility] OR ENDO    GASTROSCOPY WITH DILATION Left 06/05/2022    Performed by Joslyn Devon, MD at [facility] OR ENDO    GASTROSCOPY WITH DILATION and bxs Left 05/07/2022    Performed by Joslyn Devon, MD at [facility] OR ENDO    HX CARPAL TUNNEL RELEASE      HX CATARACT REMOVAL Right 12/30/2019    HX CATARACT REMOVAL Left 01/06/2020  HX CHOLECYSTECTOMY      HX EXPOSURE TO METAL SHAVINGS      HX HEART CATHETERIZATION      HX HYSTERECTOMY      HX OOPHORECTOMY      HX TAH AND BSO      HX TUBAL LIGATION      HX VEIN STRIPPING      Left leg    PHACO WITH INTRAOCULAR LENS Left 01/06/2020    Performed by Kathleen Argue, MD at [facility] OR MAIN    PHACO WITH INTRAOCULAR LENS Right 12/30/2019    Performed by Kathleen Argue, MD at [facility] OR MAIN     Family Medical History:       Problem Relation (Age of Onset)    Bone cancer Father    Diabetes Multiple family members    Hypertension (High Blood Pressure) Multiple family members    No Known Problems Mother    Stomach Cancer Paternal Grandmother            Social History     Socioeconomic History    Marital status: Married     Spouse name: Duke Salvia    Number of children: 3    Years of education: 13    Highest education level: High school graduate   Occupational History    Occupation: Retired   Tobacco Use    Smoking status: Never     Passive exposure: Never    Smokeless tobacco: Never   Vaping Use    Vaping status: Never Used   Substance and Sexual Activity    Alcohol use: No     Alcohol/week: 0.0 standard drinks of alcohol    Drug use: No    Sexual  activity: Not Currently     Partners: Male   Other Topics Concern    Right hand dominant Yes    Ability to Walk 1 Flight of Steps without SOB/CP No    Ability To Do Own ADL's Yes   Social History Narrative    MARRIED.  Lives in single story home.  Heats home with electric and has well water.        April Haney, LPN  6/96/2952, 14:01     Social Determinants of Health     Financial Resource Strain: Low Risk  (04/17/2023)    Financial Resource Strain     SDOH Financial: No   Transportation Needs: Low Risk  (04/17/2023)    Transportation Needs     SDOH Transportation: No   Social Connections: Low Risk  (04/17/2023)    Social Connections     SDOH Social Isolation: 5 or more times a week   Intimate Partner Violence: Low Risk  (04/17/2023)    Intimate Partner Violence     SDOH Domestic Violence: No   Housing Stability: Low Risk  (04/17/2023)    Housing Stability     SDOH Housing Situation: I have housing.     SDOH Housing Worry: No     Current Outpatient Medications   Medication Sig    acetaminophen (TYLENOL) 500 mg Oral Tablet Take 1 Tablet (500 mg total) by mouth Every night as needed Takes 2 at night    albuterol sulfate (PROVENTIL OR VENTOLIN OR PROAIR) 90 mcg/actuation Inhalation oral inhaler Take 1-2 Puffs by inhalation Every 6 hours as needed    albuterol sulfate (PROVENTIL) 2.5 mg /3 mL (0.083 %) Inhalation nebulizer solution INHALE 1 VIAL BY MOUTH VIA NEBULIZER EVERY 4 HOURS AS NEEDED FOR WHEEZING    aspirin (  ECOTRIN) 81 mg Oral Tablet, Delayed Release (E.C.) Take 1 Tablet (81 mg total) by mouth Once a day    calcium carbonate/vitamin D3 (VITAMIN D-3 ORAL) Take 2,000 Int'l Units/day by mouth Once a day    conjugated estrogens (PREMARIN) 0.625 mg/gram Vaginal Cream Insert 0.5 g into the vagina Every night    ezetimibe (ZETIA) 10 mg Oral Tablet Take 1 Tablet (10 mg total) by mouth Every evening    ferrous sulfate (FEOSOL) 325 mg (65 mg iron) Oral Tablet TAKE 1 TABLET (325 MG TOTAL) BY MOUTH ONCE A DAY FOR 3 MONTHS     FLUoxetine (PROZAC) 20 mg Oral Capsule Take 1 Capsule (20 mg total) by mouth Once a day    furosemide (LASIX) 20 mg Oral Tablet TAKE 1 TABLET (20 MG TOTAL) BY MOUTH TWICE PER DAY AS NEEDED    magnesium oxide (MAG-OX) 400 mg Oral Tablet Take 1 Tablet (400 mg total) by mouth Three times a day    metoprolol succinate (TOPROL-XL) 25 mg Oral Tablet Sustained Release 24 hr Take 1 Tablet (25 mg total) by mouth Every night    omeprazole (PRILOSEC) 20 mg Oral Capsule, Delayed Release(E.C.) Take 1 Capsule (20 mg total) by mouth Once a day    predniSONE (DELTASONE) 5 mg Oral Tablet Take 1 Tablet (5 mg total) by mouth Once a day    tiZANidine (ZANAFLEX) 4 mg Oral Tablet TAKE 1 TABLET BY MOUTH THREE TIMES A DAY AS NEEDED FOR MUSCLE CRAMPS STRENGTH: 4 MG    trimethoprim (TRIMPEX) 100 mg Oral Tablet Take 1 Tablet (100 mg total) by mouth Once a day    TRULICITY 0.75 mg/0.5 mL Subcutaneous Pen Injector INJECT 0.5 ML SUBCUTANEOUSLY  EVERY 7 DAYS     Review of Systems   Constitutional:  Negative for activity change, appetite change, chills, fatigue, fever and unexpected weight change.   HENT:  Negative for congestion, postnasal drip, rhinorrhea, sinus pain, sore throat and trouble swallowing.    Eyes:  Negative for visual disturbance.   Respiratory:  Negative for cough, chest tightness, shortness of breath and wheezing.    Cardiovascular:  Negative for chest pain, palpitations and leg swelling.   Gastrointestinal:  Negative for abdominal pain, constipation, diarrhea, nausea and vomiting.   Genitourinary:  Negative for difficulty urinating and dysuria.   Musculoskeletal:  Negative for arthralgias, back pain and myalgias.   Skin:  Negative for rash and wound.   Neurological:  Negative for dizziness, weakness, light-headedness and headaches.   Hematological:  Negative for adenopathy.   Psychiatric/Behavioral:  Negative for confusion, decreased concentration and sleep disturbance. The patient is not nervous/anxious.    All other systems  reviewed and are negative.    Objective:   BP 122/80 (Site: Left Arm, Patient Position: Sitting, Cuff Size: Adult)   Pulse 81   Temp 36.6 C (97.8 F) (Temporal)   Resp 18   Ht 1.626 m (5' 4.02")   Wt 96.5 kg (212 lb 12.8 oz)   SpO2 97%   BMI 36.51 kg/m       Physical Exam  Vitals reviewed.   Constitutional:       General: She is not in acute distress.     Appearance: Normal appearance. She is obese. She is not ill-appearing, toxic-appearing or diaphoretic.   HENT:      Head: Normocephalic and atraumatic.   Eyes:      General: No scleral icterus.        Right eye: No discharge.  Left eye: No discharge.      Extraocular Movements: Extraocular movements intact.      Conjunctiva/sclera: Conjunctivae normal.   Cardiovascular:      Rate and Rhythm: Normal rate and regular rhythm.      Pulses: Normal pulses.      Heart sounds: Normal heart sounds.   Pulmonary:      Effort: Pulmonary effort is normal. No respiratory distress.      Breath sounds: Normal breath sounds. No wheezing, rhonchi or rales.   Musculoskeletal:         General: No swelling, tenderness or deformity. Normal range of motion.      Cervical back: Normal range of motion and neck supple.      Right lower leg: No edema.      Left lower leg: No edema.   Skin:     General: Skin is warm.      Capillary Refill: Capillary refill takes less than 2 seconds.   Neurological:      General: No focal deficit present.      Mental Status: She is alert and oriented to person, place, and time. Mental status is at baseline.      Cranial Nerves: No cranial nerve deficit.      Sensory: No sensory deficit.      Motor: No weakness.      Coordination: Coordination normal.      Gait: Gait normal.      Deep Tendon Reflexes: Reflexes normal.   Psychiatric:         Mood and Affect: Mood normal.         Behavior: Behavior normal.         Thought Content: Thought content normal.         Judgment: Judgment normal.         PHQ Questionnaire  Little interest or pleasure in  doing things.: Not at all  Feeling down, depressed, or hopeless: Not at all  PHQ 2 Total: 0  Trouble falling or staying asleep, or sleeping too much.: Not at all  Feeling tired or having little energy: Not at all  Poor appetite or overeating: Not at all  Feeling bad about yourself/ that you are a failure in the past 2 weeks?: Not at all  Trouble concentrating on things in the past 2 weeks?: Not at all  Moving/Speaking slowly or being fidgety or restless  in the past 2 weeks?: Not at all  Thoughts that you would be better off DEAD, or of hurting yourself in some way.: Not at all  PHQ 9 Total: 0  Interpretation of Total Score: 0-4 No depression    Assessment/Plan     1. Type 2 diabetes mellitus (CMS HCC)  Diabetes Monitors  A1C: 6.4  A1C Date: 01/23/2023  Kidney Health:   Urine Microalbumin/Cr Ratio--12.3 Ur Microalb/Cr Ratio date--01/23/2023   eGFR --35 eGFR date--05/01/2023    Last Lipid Panel  (Last result in the past 2 years)        Cholesterol   HDL   LDL   Direct LDL   Triglycerides      01/23/23 1157 231   58   145  Comment: <100 mg/dL, Optimal  811-914 mg/dL, Near/Above Optimal  782-956 mg/dL, Borderline High  213-086 mg/dL, High  >=578 mg/dL, Very high     469            Retinal Exam Date: 10/26/2019 retinopathy status not documented  Last Foot Exam: 03/13/2022  Last A1C well controlled at 6.4  Continue Trulicity 0.5 mg under the skin once every 7 days  - CBC/DIFF; Future  - THYROID STIMULATING HORMONE (SENSITIVE TSH); Future  - Comp Metabolic Panel-Non Fasting; Future  - Magnesium; Future  - Hemoglobin A1C; Future  - Urine Microalbumin/Creatinine Ratio, Random; Future    2. Hypertension associated with type 2 diabetes mellitus (CMS HCC)  (CMS HCC)/CKD (chronic kidney disease) stage 3, 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/min (CMS HCC)  BP Readings from Last 2 Encounters:   07/14/23 122/80   05/26/23 (!) 159/67   Patient presents with normal blood pressure 122/80 with heart rate of 81   Continue Toprol-XL 25 mg p.o. nightly along  with Lasix 20 mg p.o. b.i.d. p.r.n.    3. Hyperlipidemia associated with type 2 diabetes mellitus (CMS HCC)  (CMS HCC)  Lab Results   Component Value Date    CHOLESTEROL 231 (H) 01/23/2023    HDLCHOL 58 01/23/2023    LDLCHOL 145 (H) 01/23/2023    TRIG 157 (H) 01/23/2023   Continue Zetia 10 mg p.o. once every evening  - Lipid Panel; Future    4. Colon cancer screening  - Refer to [facility] Future    5. 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    Orders Placed This Encounter    CBC/DIFF    THYROID STIMULATING HORMONE (SENSITIVE TSH)    Comp Metabolic Panel-Non Fasting    Lipid Panel    Magnesium    Hemoglobin A1C    Urine Microalbumin/Creatinine Ratio, Random    Refer to [facility]     Health Maintenance Due   Topic Date Due    Hepatitis C screening  Never done    Adult Tdap-Td (1 - Tdap) Never done    Shingles Vaccine (2 of 3) 12/16/2013    Diabetic Retinal Exam  10/25/2020    Covid-19 Vaccine (5 - 2023-24 season) 08/23/2022     Follow up: Return in about 3 months (around 10/14/2023) for Check Up/Labs .    The patient was given ample opportunity to ask questions and those questions were answered to the patient's satisfaction. The patient was encouraged to be involved in their own care, and all diagnoses, medications, and medication side-effects were discussed. The patient was told to contact me with any additional questions or concerns, or go to the ED in the case of an emergency.       Ellesse Antenucci, DO

## 2023-07-17 ENCOUNTER — Ambulatory Visit (INDEPENDENT_AMBULATORY_CARE_PROVIDER_SITE_OTHER): Payer: Self-pay | Admitting: Family Medicine

## 2023-07-21 ENCOUNTER — Encounter (HOSPITAL_BASED_OUTPATIENT_CLINIC_OR_DEPARTMENT_OTHER): Payer: Self-pay | Admitting: Neurological Surgery

## 2023-07-21 ENCOUNTER — Inpatient Hospital Stay (HOSPITAL_BASED_OUTPATIENT_CLINIC_OR_DEPARTMENT_OTHER)
Admission: RE | Admit: 2023-07-21 | Discharge: 2023-07-21 | Disposition: A | Discharge status: Home / Self Care | Payer: Medicare PPO | Source: Ambulatory Visit | Admitting: Radiology

## 2023-07-21 ENCOUNTER — Ambulatory Visit: Payer: Medicare PPO | Attending: Neurological Surgery

## 2023-07-21 ENCOUNTER — Other Ambulatory Visit: Payer: Self-pay

## 2023-07-21 DIAGNOSIS — M4316 Spondylolisthesis, lumbar region: Secondary | ICD-10-CM

## 2023-07-21 DIAGNOSIS — M5410 Radiculopathy, site unspecified: Secondary | ICD-10-CM

## 2023-07-21 DIAGNOSIS — M5116 Intervertebral disc disorders with radiculopathy, lumbar region: Secondary | ICD-10-CM

## 2023-07-21 DIAGNOSIS — M4807 Spinal stenosis, lumbosacral region: Secondary | ICD-10-CM

## 2023-07-21 DIAGNOSIS — M545 Low back pain, unspecified: Secondary | ICD-10-CM

## 2023-07-22 ENCOUNTER — Ambulatory Visit: Payer: Medicare PPO | Admitting: Student in an Organized Health Care Education/Training Program

## 2023-07-22 DIAGNOSIS — Z79899 Other long term (current) drug therapy: Secondary | ICD-10-CM

## 2023-07-22 DIAGNOSIS — M353 Polymyalgia rheumatica: Secondary | ICD-10-CM

## 2023-07-22 NOTE — Progress Notes (Signed)
TELEMEDICINE DOCUMENTATION:    Patient Location:  MyChart video visit from home address: [address] [place] [facility] [address]    Patient/family aware of provider location:  yes  Patient/family consent for telemedicine:  yes  Examination observed and performed by:  Feliz Beam, MD      HPI:  This is a case of a 77 y.o. year old female who comes in today for follow-up of polymyalgia rheumatica    Medical conditions notable for:  -hospitalized 10/29/22-11/01/22 for acute hypoxic respiratory failure.  That have significant proximal pain and weakness in her shoulders and hips with elevated inflammatory markers.  Discharged on prednisone 50 mg daily.  -obesity BMI 36   -asthma   -coronary artery disease   -CKD stage 3   -hypertension    Polymyalgia rheumatica regimen   -prednisone 4 mg daily (she has been on this dose for a month)    Patient has been doing good for the past 2-3 weeks. Lower back is doing alright and she is going to try to see pain medicine. She denied having any scalp tenderness, headaches, fevers, or jaw claudication.  She feels like her vision is a little bit worse and she was needing to squint but denies having any loss of vision.    Initial HPI listed below  She was having neck and shoulder pain. She had been getting left sided frontal headaches at that time. No jaw locking or issues with her tongue. She had some blurry vision around that time, but no vision loss. Patient is currently on prednisone 5 mg daily for PMR for around a month. Shoulders are back to her baseline. She has had issues with heartburn before but it is well controlled currently on omeprazole. She has had esophageal dilations in the past.     Denies having any shortness of breath, chest pain, abdominal pain, fevers, unintential weight loss.    All systems reviewed and are otherwise unremarkable unless stated above.    Social Hx- no alcohol use, no smoking, resides in [place]   Family Hx- no family hx of RA or SLE    Current  Outpatient Medications   Medication Sig Dispense Refill    acetaminophen (TYLENOL) 500 mg Oral Tablet Take 1 Tablet (500 mg total) by mouth Every night as needed Takes 2 at night      albuterol sulfate (PROVENTIL OR VENTOLIN OR PROAIR) 90 mcg/actuation Inhalation oral inhaler Take 1-2 Puffs by inhalation Every 6 hours as needed 1 Each 3    albuterol sulfate (PROVENTIL) 2.5 mg /3 mL (0.083 %) Inhalation nebulizer solution INHALE 1 VIAL BY MOUTH VIA NEBULIZER EVERY 4 HOURS AS NEEDED FOR WHEEZING 225 mL 3    aspirin (ECOTRIN) 81 mg Oral Tablet, Delayed Release (E.C.) Take 1 Tablet (81 mg total) by mouth Once a day      calcium carbonate/vitamin D3 (VITAMIN D-3 ORAL) Take 2,000 Int'l Units/day by mouth Once a day      conjugated estrogens (PREMARIN) 0.625 mg/gram Vaginal Cream Insert 0.5 g into the vagina Every night 30 g 5    ezetimibe (ZETIA) 10 mg Oral Tablet Take 1 Tablet (10 mg total) by mouth Every evening 90 Tablet 1    ferrous sulfate (FEOSOL) 325 mg (65 mg iron) Oral Tablet TAKE 1 TABLET (325 MG TOTAL) BY MOUTH ONCE A DAY FOR 3 MONTHS 90 Tablet 0    FLUoxetine (PROZAC) 20 mg Oral Capsule Take 1 Capsule (20 mg total) by mouth Once a day  90 Capsule 1    furosemide (LASIX) 20 mg Oral Tablet TAKE 1 TABLET (20 MG TOTAL) BY MOUTH TWICE PER DAY AS NEEDED 180 Tablet 1    magnesium oxide (MAG-OX) 400 mg Oral Tablet Take 1 Tablet (400 mg total) by mouth Three times a day 30 Tablet 0    metoprolol succinate (TOPROL-XL) 25 mg Oral Tablet Sustained Release 24 hr Take 1 Tablet (25 mg total) by mouth Every night 90 Tablet 1    omeprazole (PRILOSEC) 20 mg Oral Capsule, Delayed Release(E.C.) Take 1 Capsule (20 mg total) by mouth Once a day 90 Capsule 1    predniSONE (DELTASONE) 5 mg Oral Tablet Take 1 Tablet (5 mg total) by mouth Once a day 30 Tablet 0    tiZANidine (ZANAFLEX) 4 mg Oral Tablet TAKE 1 TABLET BY MOUTH THREE TIMES A DAY AS NEEDED FOR MUSCLE CRAMPS STRENGTH: 4 MG 270 Tablet 1    trimethoprim (TRIMPEX) 100 mg Oral  Tablet Take 1 Tablet (100 mg total) by mouth Once a day 90 Tablet 3    TRULICITY 0.75 mg/0.5 mL Subcutaneous Pen Injector INJECT 0.5 ML SUBCUTANEOUSLY  EVERY 7 DAYS 6 mL 3     No current facility-administered medications for this visit.     Allergies   Allergen Reactions    Amoxicillin  Other Adverse Reaction (Add comment)     Unknown reaction    Augmentin [Amoxicillin-Pot Clavulanate] Nausea/ Vomiting    Statins-Hmg-Coa Reductase Inhibitors Myalgia     Past Medical History:   Diagnosis Date    Arthritis     Asthma     Back problem     BMI 35.0-35.9,adult 02/16/2020    CAD (coronary artery disease)     mild    Cataract     Cataracts, bilateral     CHF (congestive heart failure) (CMS HCC)     Chronic bronchitis with emphysema (CMS HCC)     Chronic low back pain     Claustrophobia     Depression     Diabetes (CMS HCC)     Diabetes mellitus, type 2 (CMS HCC)     Encounter for support and coordination of transition of care 11/16/2022    Esophageal reflux     Essential hypertension 01/28/2019    H/O urinary tract infection     HH (hiatus hernia)     History of kidney disease     states, "low kidney functions."    Hypercholesterolemia     Hypertension     Hypertriglyceridemia     Type 2 diabetes mellitus (CMS HCC)          Past Surgical History:   Procedure Laterality Date    CATARACT EXTRACTION, BILATERAL Bilateral     bilateral lens implant    COLONOSCOPY      ENDOSCOPIC ULTRASOUND ESOPHGEAL  04/15/2023    ESOPHAGOGASTRODUODENOSCOPY      HX CARPAL TUNNEL RELEASE      HX CATARACT REMOVAL Right 12/30/2019    HX CATARACT REMOVAL Left 01/06/2020    HX CHOLECYSTECTOMY      HX EXPOSURE TO METAL SHAVINGS      HX HEART CATHETERIZATION      HX HYSTERECTOMY      HX OOPHORECTOMY      HX TAH AND BSO      HX TUBAL LIGATION      HX VEIN STRIPPING      Left leg     PHYSICAL EXAM:  General- well appearing woman resting  comfortably in chair      Labs/imaging:  WBC 11.3, hemoglobin 11.4, platelet count 261   Creatinine 1.5    Region  BMD  (g/cm) Young Adult  T-score Age Matched  Z-score   AP Spine 1.085 -0.8 0.0   Left Hip total 0.815 -1.5 -0.5   Left Hip Femoral Neck 0.729 -2.2 -0.9     Hip total           Hip Femoral Neck           Forearm           Forearm            Note: T-Scores (Standard deviation from normal young adult mean)  Normal T-Score > -1 BMD within 1 SD of a "young normal" adult   Osteopenia (low bone mass) T-Score = -1.0 to -2.5 BMD is between 1 and 2.5 SD below that of a "young normal" adult   Osteoporosis T-Score < -2.5 BMD is 2.5 SD or more below that of a "young normal" adult      FRAX* Results:      10-Year Probability of Fracture   Major Osteoporotic Fracture  20.6%% Hip Fracture  5.4%%         Assessment and Plan:  77 y.o. year old female presents today for follow-up of polymyalgia rheumatica.  Patient had significantly elevated inflammatory markers last fall when she was symptomatic from scalp tenderness, headaches, shoulder and girdle stiffness which resolved with high doses of corticosteroids.  Temporal artery biopsy negative.  Patient had good response to steroid taper however developed worsening shoulder stiffness, pain, and headaches when she went from 3 mg daily prednisone to 2 mg daily prednisone ultimately was restarted at 5 mg daily.  Inflammatory markers in May when she was having symptoms were significantly elevated but she had no GCA like symptoms.  Will recheck inflammatory markers later this week.  Recommend reducing dose of prednisone from 4 mg to 3 mg as patient has done well over the last 3 weeks.  Tentatively would alternate doses of 3 mg and 2 mg in the future if patient tolerates the daily 3 mg dose.  Plan follow-up in October.  Patient will send a message September 1st let me know how she was doing.      1. Polymyalgia rheumatica (CMS HCC)  2. High-risk medication use  - in remission on 4 mg daily prednisone and Kevzara  - follow-up blood work; CRP significantly elevated at last visit in May when  she had active symptoms.  - prednisone taper as outlined above    Return for already scheduled.    Feliz Beam, MD

## 2023-07-23 NOTE — H&P (Signed)
Department of Neurosurgery  Consultation    Name:  Joanna Black     MR#:    [id number]   DOB:  02-11-1946     Date/Time: 07/23/2023, 12:54  ______________________________________________________________________    Assessment:   ENCOUNTER DIAGNOSES     ICD-10-CM   1. Lower back pain  M54.50   2. Radiculopathy, unspecified spinal region  M54.10        IMPRESSION: Joanna Black is a 77 year old female who presents to clinic with complaints of back pain and bilateral lower extremity pain L>R. 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 Will exhaust conservative management with injection therapy at this time.      Plan:     -- Dyamic XR performed today. No abnormal movement of L4-5 spondylithesis appreciated.     -- Patient referred to [facility].     -- Patient will RTC on PRN basis for any new or worsening symptoms.     Patient evaluated independently in clinic.     Daneil Dan, PA-C  [facility] Department of Neurosurgery   07/23/2023 12:54 PM   _______________________________________________________________________    Chief Complaint:  Back pain and Leg pain    History of Present Illness:  Joanna Black  is a 77 y.o. female who has been referred to the [facility] with complaints of back pain and bilateral leg pain. She states back pain > leg pain. Pain travels down bilateral lower extremities in non-dermatomal pattern. She states pain has been ongoing for 40 years with no inciting event. She characterizes her pain as a dull ache across her low back that becomes sharp with activity with dull aching/cramping sensations extending into her legs. She currently rates her pain a 2/10. Aggravating factors include walking, bending and standing. Alleviating factors include sitting and lying. She has tried OTC pain medications with no relief of symptoms. She has not been involved with recent physical therapy or injection  therapy. Pertinent negatives include bowel/bladder incontinence, saddle paresthesias and gait instability. She denies any other questions or concerns at this time.     Conservative treatment to date:  Time, Rest, NSAID'S    Past Medical/Surgical History:  Past Medical History:   Diagnosis Date    Arthritis     Asthma     Back problem     BMI 35.0-35.9,adult 02/16/2020    CAD (coronary artery disease)     mild    Cataract     Cataracts, bilateral     CHF (congestive heart failure) (CMS HCC)     Chronic bronchitis with emphysema (CMS HCC)     Chronic low back pain     Claustrophobia     Depression     Diabetes (CMS HCC)     Diabetes mellitus, type 2 (CMS HCC)     Encounter for support and coordination of transition of care 11/16/2022    Esophageal reflux     Essential hypertension 01/28/2019    H/O urinary tract infection     HH (hiatus hernia)     History of kidney disease     states, "low kidney functions."    Hypercholesterolemia     Hypertension     Hypertriglyceridemia     Type 2 diabetes mellitus (CMS HCC)           Past Surgical History:   Procedure Laterality Date    CATARACT EXTRACTION, BILATERAL Bilateral  bilateral lens implant    COLONOSCOPY      ENDOSCOPIC ULTRASOUND ESOPHGEAL  04/15/2023    ESOPHAGOGASTRODUODENOSCOPY      HX CARPAL TUNNEL RELEASE      HX CATARACT REMOVAL Right 12/30/2019    HX CATARACT REMOVAL Left 01/06/2020    HX CHOLECYSTECTOMY      HX EXPOSURE TO METAL SHAVINGS      HX HEART CATHETERIZATION      HX HYSTERECTOMY      HX OOPHORECTOMY      HX TAH AND BSO      HX TUBAL LIGATION      HX VEIN STRIPPING      Left leg            Family History:  Family Medical History:       Problem Relation (Age of Onset)    Bone cancer Father    Diabetes Multiple family members    Hypertension (High Blood Pressure) Multiple family members    No Known Problems Mother    Stomach Cancer Paternal Grandmother              Allergies:  Allergies   Allergen Reactions    Amoxicillin  Other Adverse Reaction (Add  comment)     Unknown reaction    Augmentin [Amoxicillin-Pot Clavulanate] Nausea/ Vomiting    Statins-Hmg-Coa Reductase Inhibitors Myalgia       Home Medications:  Prior to Admission medications    Medication Sig Start Date End Date Taking? Authorizing Provider   acetaminophen (TYLENOL) 500 mg Oral Tablet Take 1 Tablet (500 mg total) by mouth Every night as needed Takes 2 at night   Yes Provider, Historical   albuterol sulfate (PROVENTIL OR VENTOLIN OR PROAIR) 90 mcg/actuation Inhalation oral inhaler Take 1-2 Puffs by inhalation Every 6 hours as needed 12/19/21  Yes Oney, Augusto Gamble A, PA-C   albuterol sulfate (PROVENTIL) 2.5 mg /3 mL (0.083 %) Inhalation nebulizer solution INHALE 1 VIAL BY MOUTH VIA NEBULIZER EVERY 4 HOURS AS NEEDED FOR WHEEZING 07/04/23  Yes Virdi, Wendie Chess, MD   aspirin (ECOTRIN) 81 mg Oral Tablet, Delayed Release (E.C.) Take 1 Tablet (81 mg total) by mouth Once a day   Yes Provider, Historical   calcium carbonate/vitamin D3 (VITAMIN D-3 ORAL) Take 2,000 Int'l Units/day by mouth Once a day   Yes Provider, Historical   conjugated estrogens (PREMARIN) 0.625 mg/gram Vaginal Cream Insert 0.5 g into the vagina Every night 05/26/23 05/25/24 Yes Meriwether, Candace Cruise, MD   ezetimibe (ZETIA) 10 mg Oral Tablet Take 1 Tablet (10 mg total) by mouth Every evening 04/17/23  Yes Dennison, Zane, DO   ferrous sulfate (FEOSOL) 325 mg (65 mg iron) Oral Tablet TAKE 1 TABLET (325 MG TOTAL) BY MOUTH ONCE A DAY FOR 3 MONTHS 07/03/23  Yes Shaikh, [place], APRN,NP-C   FLUoxetine (PROZAC) 20 mg Oral Capsule Take 1 Capsule (20 mg total) by mouth Once a day 04/17/23  Yes Dennison, Zane, DO   furosemide (LASIX) 20 mg Oral Tablet TAKE 1 TABLET (20 MG TOTAL) BY MOUTH TWICE PER DAY AS NEEDED 07/03/23  Yes Dennison, Zane, DO   magnesium oxide (MAG-OX) 400 mg Oral Tablet Take 1 Tablet (400 mg total) by mouth Three times a day 05/02/23  Yes Sheliah Plane, MD   metoprolol succinate (TOPROL-XL) 25 mg Oral Tablet Sustained Release 24 hr Take 1 Tablet  (25 mg total) by mouth Every night 04/17/23  Yes Dennison, Zane, DO   omeprazole (PRILOSEC) 20 mg Oral Capsule, Delayed  Release(E.C.) Take 1 Capsule (20 mg total) by mouth Once a day 04/17/23  Yes Dennison, Zane, DO   predniSONE (DELTASONE) 5 mg Oral Tablet Take 1 Tablet (5 mg total) by mouth Once a day 07/04/23  Yes Feliz Beam, MD   tiZANidine (ZANAFLEX) 4 mg Oral Tablet TAKE 1 TABLET BY MOUTH THREE TIMES A DAY AS NEEDED FOR MUSCLE CRAMPS STRENGTH: 4 MG 06/18/23  Yes Dennison, Zane, DO   trimethoprim (TRIMPEX) 100 mg Oral Tablet Take 1 Tablet (100 mg total) by mouth Once a day 05/13/23 05/12/24 Yes Meriwether, Candace Cruise, MD   TRULICITY 0.75 mg/0.5 mL Subcutaneous Pen Injector INJECT 0.5 ML SUBCUTANEOUSLY  EVERY 7 DAYS 05/12/23  Yes Dennison, Zane, DO   albuterol sulfate (PROVENTIL) 2.5 mg /3 mL (0.083 %) Inhalation nebulizer solution Take 3 mL (2.5 mg total) by nebulization Every 4 hours as needed for Wheezing 06/12/22 07/04/23  Ernestina Columbia, MD   ciprofloxacin HCl (CIPRO) 250 mg Oral Tablet Take 1 Tablet (250 mg total) by mouth Twice daily for 21 days 05/26/23 07/14/23  Girtha Hake, MD   ferrous sulfate (FEOSOL) 325 mg (65 mg iron) Oral Tablet Take 1 Tablet (325 mg total) by mouth Once a day For 3 months 05/06/23 07/03/23  Joslyn Devon, MD   furosemide (LASIX) 20 mg Oral Tablet Take 1 Tablet (20 mg total) by mouth Twice per day as needed 04/17/23 07/03/23  Genene Churn, DO   predniSONE (DELTASONE) 1 mg Oral Tablet Take 3 tablets daily for 21 days, then alternate daily doses of 2 tabs and 3 tabs for 21 days, then start 2 tabs of prednisone daily for 21 days 05/01/23 07/04/23  Feliz Beam, MD       Social History:   Social History     Tobacco Use    Smoking status: Never     Passive exposure: Never    Smokeless tobacco: Never   Vaping Use    Vaping status: Never Used   Substance Use Topics    Alcohol use: No     Alcohol/week: 0.0 standard drinks of alcohol    Drug use: No        Review of  Systems:  Constitutional: no weight loss, fever, night sweats  GI: No change in bowel habits, No Nausea, vomitting, diarrhea, or constipation  GU: Negative for dysuria, frequency and incontinence  Neuro: negative    Physical Exam:  BP 128/70   Pulse 67   Temp 36.6 C (97.9 F)   Resp 16   Ht 1.626 m (5\' 4" )   Wt 97.2 kg (214 lb 4.6 oz)   SpO2 95%   BMI 36.78 kg/m     Neurologic Exam:     Alert and oriented x3 with normal speech.    Sensation:  Intact to light touch in lower extremities.    Gait: Normal    Motor examination:   Leg Right Left   Iliopsoas (L2) 5/5 5/5        Quadricep (L3/4) 5/5 5/5   Anterior Tibialis (L5) 5/5 5/5   Gastroc-soleus (S1) 5/5 5/5        Imaging:  I reviewed the study(ies) myself and made my own interpretation.  My interpretation can be found in the HPI and/or Assessment.  The following is the radiology report:    MRI SPINE LUMBOSACRAL WO CONTRAST     FINDINGS: Multiplanar multisequence images lumbar spine obtained without IV contrast enhancement. There is grade 1 anterolisthesis L4 on L5. There is  no bone edema. There is disc desiccation at multiple levels throughout the lumbar spine. Mild disc space narrowing is seen. Spinal cord is normal with conus terminating at L1.     There is mild disc bulging L1-2 but no significant spinal stenosis.     Mild disc bulging and degenerative facet arthropathy L2-3 without significant spinal stenosis.     Degenerative facet changes and mild disc bulging L3-4 with mild central canal stenosis and foraminal narrowing.     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     Degenerative facet changes L5-S1 with disc bulging more prominent towards the left side. There is a central disc protrusion or herniation superimposed. There is moderate left foraminal narrowing. There is mild central canal stenosis.     Paraspinal soft  tissues appear normal.     No findings to suggest infectious or inflammatory process of the lumbar spine.     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    On the day of the encounter, a total of  20 minutes was spent by me on this patient encounter including review of historical information, examination, documentation and post-visit activities.

## 2023-07-30 ENCOUNTER — Ambulatory Visit: Payer: Medicare PPO | Attending: Surgery | Admitting: Surgery

## 2023-07-30 ENCOUNTER — Other Ambulatory Visit (HOSPITAL_COMMUNITY): Payer: Medicare PPO

## 2023-07-30 ENCOUNTER — Ambulatory Visit (INDEPENDENT_AMBULATORY_CARE_PROVIDER_SITE_OTHER): Payer: Medicare PPO | Admitting: Student in an Organized Health Care Education/Training Program

## 2023-07-30 ENCOUNTER — Encounter (HOSPITAL_COMMUNITY): Payer: Self-pay | Admitting: Surgery

## 2023-07-30 ENCOUNTER — Other Ambulatory Visit: Payer: Self-pay

## 2023-07-30 VITALS — Ht 64.0 in | Wt 214.0 lb

## 2023-07-30 DIAGNOSIS — Z1211 Encounter for screening for malignant neoplasm of colon: Secondary | ICD-10-CM

## 2023-07-30 DIAGNOSIS — M353 Polymyalgia rheumatica: Secondary | ICD-10-CM

## 2023-07-30 DIAGNOSIS — Z8719 Personal history of other diseases of the digestive system: Secondary | ICD-10-CM | POA: Insufficient documentation

## 2023-07-30 LAB — C-REACTIVE PROTEIN(CRP),INFLAMMATION: CRP INFLAMMATION: 69.1 mg/L — ABNORMAL HIGH (ref <8.0)

## 2023-07-30 LAB — SEDIMENTATION RATE: ERYTHROCYTE SEDIMENTATION RATE (ESR): 20 mm/hr (ref <39)

## 2023-07-30 NOTE — H&P (Signed)
[facility]  GENERAL SURGERY, [facility]  [address]  [place] [address]  [phone]   General Surgery History & Physical    Date of Service:  07/30/2023  Joanna Black, Joanna Black, 77 y.o. female  Date of Birth:  [birth year]  PCP: Genene Churn, DO  Referring:  Genene Churn     Chief Complaint:  Colon cancer screening    HPI:  Joanna Black is a 77 y.o. White female who presents for  evaluation for colon cancer screening.  The patient denies rectal bleeding or change in bowel habits.  She denies unexplained weight loss or family history of colon cancer.  Patient underwent colonoscopy 6 years ago.  The report is in the record.  She had a normal colon at that time.  Patient reports she was recently using iron supplements.  She did have black stool during that time.  Stool became normal color after she stopped using iron.  She has no other complaints today.      Past Medical History:   Diagnosis Date    Arthritis     Asthma     Back problem     BMI 35.0-35.9,adult 02/16/2020    CAD (coronary artery disease)     mild    Cataract     Cataracts, bilateral     CHF (congestive heart failure) (CMS HCC)     Chronic bronchitis with emphysema (CMS HCC)     Chronic low back pain     Claustrophobia     Depression     Diabetes (CMS HCC)     Diabetes mellitus, type 2 (CMS HCC)     Encounter for support and coordination of transition of care 11/16/2022    Esophageal reflux     Essential hypertension 01/28/2019    H/O urinary tract infection     HH (hiatus hernia)     History of kidney disease     states, "low kidney functions."    Hypercholesterolemia     Hypertension     Hypertriglyceridemia     Type 2 diabetes mellitus (CMS HCC)       Past Surgical History:   Procedure Laterality Date    CATARACT EXTRACTION, BILATERAL Bilateral     bilateral lens implant    COLONOSCOPY      ENDOSCOPIC ULTRASOUND ESOPHGEAL  04/15/2023    ESOPHAGOGASTRODUODENOSCOPY      HX CARPAL TUNNEL RELEASE      HX CATARACT  REMOVAL Right 12/30/2019    HX CATARACT REMOVAL Left 01/06/2020    HX CHOLECYSTECTOMY      HX EXPOSURE TO METAL SHAVINGS      HX HEART CATHETERIZATION      HX HYSTERECTOMY      HX OOPHORECTOMY      HX TAH AND BSO      HX TUBAL LIGATION      HX VEIN STRIPPING      Left leg      Social History     Tobacco Use    Smoking status: Never     Passive exposure: Never    Smokeless tobacco: Never   Vaping Use    Vaping status: Never Used   Substance Use Topics    Alcohol use: No     Alcohol/week: 0.0 standard drinks of alcohol    Drug use: No       Family Medical History:       Problem Relation (Age of Onset)    Bone  cancer Father    Diabetes Multiple family members    Hypertension (High Blood Pressure) Multiple family members    No Known Problems Mother    Stomach Cancer Paternal Grandmother           Outpatient Medications Marked as Taking for the 07/30/23 encounter (Office Visit) with Amedeo Gory, DO   Medication Sig    albuterol sulfate (PROVENTIL OR VENTOLIN OR PROAIR) 90 mcg/actuation Inhalation oral inhaler Take 1-2 Puffs by inhalation Every 6 hours as needed    albuterol sulfate (PROVENTIL) 2.5 mg /3 mL (0.083 %) Inhalation nebulizer solution INHALE 1 VIAL BY MOUTH VIA NEBULIZER EVERY 4 HOURS AS NEEDED FOR WHEEZING    aspirin (ECOTRIN) 81 mg Oral Tablet, Delayed Release (E.C.) Take 1 Tablet (81 mg total) by mouth Once a day    calcium carbonate/vitamin D3 (VITAMIN D-3 ORAL) Take 2,000 Int'l Units/day by mouth Once a day    ezetimibe (ZETIA) 10 mg Oral Tablet Take 1 Tablet (10 mg total) by mouth Every evening    FLUoxetine (PROZAC) 20 mg Oral Capsule Take 1 Capsule (20 mg total) by mouth Once a day    metoprolol succinate (TOPROL-XL) 25 mg Oral Tablet Sustained Release 24 hr Take 1 Tablet (25 mg total) by mouth Every night    omeprazole (PRILOSEC) 20 mg Oral Capsule, Delayed Release(E.C.) Take 1 Capsule (20 mg total) by mouth Once a day    predniSONE (DELTASONE) 5 mg Oral Tablet Take 1 Tablet (5 mg total) by  mouth Once a day    tiZANidine (ZANAFLEX) 4 mg Oral Tablet TAKE 1 TABLET BY MOUTH THREE TIMES A DAY AS NEEDED FOR MUSCLE CRAMPS STRENGTH: 4 MG    trimethoprim (TRIMPEX) 100 mg Oral Tablet Take 1 Tablet (100 mg total) by mouth Once a day    TRULICITY 0.75 mg/0.5 mL Subcutaneous Pen Injector INJECT 0.5 ML SUBCUTANEOUSLY  EVERY 7 DAYS      Allergies   Allergen Reactions    Amoxicillin  Other Adverse Reaction (Add comment)     Unknown reaction    Augmentin [Amoxicillin-Pot Clavulanate] Nausea/ Vomiting    Statins-Hmg-Coa Reductase Inhibitors Myalgia        Review of Systems   Constitutional:  Negative for chills, fever and weight loss.   HENT:  Negative for hearing loss and tinnitus.    Eyes:  Negative for blurred vision, double vision and pain.   Respiratory:  Negative for cough, hemoptysis and shortness of breath.    Cardiovascular:  Negative for chest pain, palpitations, claudication and leg swelling.   Gastrointestinal:  Negative for abdominal pain, blood in stool, constipation, diarrhea, heartburn, melena, nausea and vomiting.   Genitourinary:  Negative for dysuria, frequency and hematuria.   Musculoskeletal:  Negative for back pain and neck pain.   Skin:  Negative for itching and rash.   Neurological:  Negative for speech change, focal weakness and weakness.   Endo/Heme/Allergies:  Negative for environmental allergies. Does not bruise/bleed easily.   Psychiatric/Behavioral:  Negative for depression and hallucinations.           Ht 1.626 m (5\' 4" )   Wt 97.1 kg (214 lb)   BMI 36.73 kg/m          Physical Exam  Constitutional:       General: She is not in acute distress.     Appearance: She is not diaphoretic.   HENT:      Head: Normocephalic and atraumatic.   Eyes:  Pupils: Pupils are equal, round, and reactive to light.   Neck:      Thyroid: No thyromegaly.      Trachea: No tracheal deviation.   Cardiovascular:      Rate and Rhythm: Normal rate.   Pulmonary:      Effort: Pulmonary effort is normal. No  respiratory distress.   Abdominal:      General: There is no distension.      Palpations: Abdomen is soft. There is no mass.      Tenderness: There is no abdominal tenderness. There is no guarding or rebound.   Musculoskeletal:         General: Normal range of motion.      Cervical back: Normal range of motion and neck supple.   Skin:     General: Skin is warm.      Findings: No erythema.   Neurological:      General: No focal deficit present.      Mental Status: She is alert and oriented to person, place, and time.   Psychiatric:         Mood and Affect: Mood and affect normal.         Behavior: Behavior normal.          Data Review:  Pertinent laboratory data and imaging studies reviewed.      Assessment/Plan:  Assessment/Plan   1. Colon cancer screening         Colonoscopy is up-to-date.  There is no indication for diagnostic colonoscopy at this time.  Patient can have another screening colonoscopy in 4 years, if she chooses, at the age of 73.  She can follow up sooner as needed.    Return if symptoms worsen or fail to improve.     Patient was seen and evaluated and findings were communicated with referring physician.    Amedeo Gory, DO

## 2023-08-01 ENCOUNTER — Encounter (INDEPENDENT_AMBULATORY_CARE_PROVIDER_SITE_OTHER): Payer: Self-pay | Admitting: Student in an Organized Health Care Education/Training Program

## 2023-08-05 ENCOUNTER — Other Ambulatory Visit: Payer: Self-pay

## 2023-08-05 ENCOUNTER — Other Ambulatory Visit: Payer: Medicare PPO | Attending: Student in an Organized Health Care Education/Training Program

## 2023-08-05 DIAGNOSIS — M353 Polymyalgia rheumatica: Secondary | ICD-10-CM | POA: Insufficient documentation

## 2023-08-05 LAB — C-REACTIVE PROTEIN(CRP),INFLAMMATION: CRP INFLAMMATION: 29.4 mg/L — ABNORMAL HIGH (ref <8.0)

## 2023-08-05 LAB — SEDIMENTATION RATE: ERYTHROCYTE SEDIMENTATION RATE (ESR): 15 mm/hr (ref <39)

## 2023-08-06 ENCOUNTER — Encounter (INDEPENDENT_AMBULATORY_CARE_PROVIDER_SITE_OTHER): Payer: Self-pay | Admitting: Emergency Medicine

## 2023-08-06 ENCOUNTER — Other Ambulatory Visit (INDEPENDENT_AMBULATORY_CARE_PROVIDER_SITE_OTHER): Payer: Self-pay | Admitting: Family Medicine

## 2023-08-06 ENCOUNTER — Ambulatory Visit: Payer: Medicare PPO | Attending: Emergency Medicine | Admitting: Emergency Medicine

## 2023-08-06 ENCOUNTER — Other Ambulatory Visit (INDEPENDENT_AMBULATORY_CARE_PROVIDER_SITE_OTHER): Payer: Self-pay | Admitting: Student in an Organized Health Care Education/Training Program

## 2023-08-06 VITALS — BP 146/49 | HR 63 | Temp 97.6°F | Ht 64.0 in | Wt 215.6 lb

## 2023-08-06 DIAGNOSIS — I10 Essential (primary) hypertension: Secondary | ICD-10-CM

## 2023-08-06 DIAGNOSIS — M5136 Other intervertebral disc degeneration, lumbar region: Secondary | ICD-10-CM | POA: Insufficient documentation

## 2023-08-06 DIAGNOSIS — M545 Low back pain, unspecified: Secondary | ICD-10-CM | POA: Insufficient documentation

## 2023-08-06 DIAGNOSIS — M353 Polymyalgia rheumatica: Secondary | ICD-10-CM

## 2023-08-06 DIAGNOSIS — M4726 Other spondylosis with radiculopathy, lumbar region: Secondary | ICD-10-CM

## 2023-08-06 DIAGNOSIS — M47816 Spondylosis without myelopathy or radiculopathy, lumbar region: Secondary | ICD-10-CM | POA: Insufficient documentation

## 2023-08-06 DIAGNOSIS — M5410 Radiculopathy, site unspecified: Secondary | ICD-10-CM | POA: Insufficient documentation

## 2023-08-06 DIAGNOSIS — M5116 Intervertebral disc disorders with radiculopathy, lumbar region: Secondary | ICD-10-CM

## 2023-08-06 NOTE — H&P (Signed)
Pain Management, [facility]  [address]  [place] [address]  [phone]    Patient Name:  Joanna Black       MRN:  [id number]   DOB: 12/22/1946   Date of Service:  08/06/2023     CC: New Patient    HPI: Joanna Black is a 77 y.o. female who presents to [place] at [facility] for initial evaluation of the above-stated pain complaint as referred by Daneil Dan, PA-C.  Onset: years ago  Location: posterior, lumbar with radiation into both LE  Quality: sharp, cramping, stabbing, electric shock, and squeezing  Pain score is between 5 and 9 out of 10 on the verbal pain scale.  Frequency: constant and varies  Worse with bending, lifting, sitting, standing, walking, and weather and temperature changes  Better with rest  Incontinence of the bladder not present.  Denies associated anticoagulant use .   The pain makes the patient feel exhausted    Goal for pain management includes  Improve pain and mobility.    Have you had therapy for the condition you are being seen for today?: No  Have you tried an anti-inflammatory for at least 3 weeks and failed? : No  Are your symptoms worsening?: No  Are your symptoms worsening?: No  Do you have weakness in your arms/legs? : Yes  Do you have numbness/tingling in your arms/legs? : No  Do you have current imaging (xrays, MRI's, CT's)?: Yes  If yes, what tests have you completed?: MRI  Where were the tests performed?: [place]  Entered by (initials): hng       Forbes Cellar, DO 08/06/2023, 13:25      The patient reports participating in conservative therapies including physical therapy and medication management without adequate pain relief.  Pt states that they continue a daily stretching and exercise program that they performs in the morning and evening that they learned at physical therapy.  Pt reports that despite this continued activity and the use of over-the-counter pain medications they continues to have  chronic low back pain and lower extremity pain.  Pt reports the pain does interfere with activities of daily living and with sleep.    PT   PAST SURGICAL HISTORY  Past Surgical History:   Procedure Laterality Date    CATARACT EXTRACTION, BILATERAL Bilateral     bilateral lens implant    COLONOSCOPY      CYSTOSCOPY N/A 05/26/2023    Performed by Girtha Hake, MD at [facility] OR MAIN    ENDOSCOPIC U/S UPPER with FNA N/A 04/15/2023    Performed by Edwin Cap, DO at [facility] OR ENDO    ENDOSCOPIC U/S UPPER with FNA N/A 05/10/2022    Performed by Edwin Cap, DO at [facility] OR ENDO    ENDOSCOPIC ULTRASOUND ESOPHGEAL  04/15/2023    ESOPHAGOGASTRODUODENOSCOPY      GASTROSCOPY  05/10/2022    Performed by Edwin Cap, DO at [facility] OR ENDO    GASTROSCOPY WITH DILATION Left 09/24/2022    Performed by Joslyn Devon, MD at [facility] OR ENDO    GASTROSCOPY WITH DILATION Left 06/05/2022    Performed by Joslyn Devon, MD at [facility] OR ENDO    GASTROSCOPY WITH DILATION and bxs Left 05/07/2022    Performed by Joslyn Devon, MD at [facility] OR ENDO    HX CARPAL TUNNEL RELEASE      HX CATARACT REMOVAL Right 12/30/2019    HX CATARACT  REMOVAL Left 01/06/2020    HX CHOLECYSTECTOMY      HX EXPOSURE TO METAL SHAVINGS      HX HEART CATHETERIZATION      HX HYSTERECTOMY      HX OOPHORECTOMY      HX TAH AND BSO      HX TUBAL LIGATION      HX VEIN STRIPPING      Left leg    PHACO WITH INTRAOCULAR LENS Left 01/06/2020    Performed by Kathleen Argue, MD at [facility] OR MAIN    PHACO WITH INTRAOCULAR LENS Right 12/30/2019    Performed by Kathleen Argue, MD at [facility] OR MAIN       PAST MEDICAL HISTORY  Past Medical History:   Diagnosis Date    Arthritis     Asthma     Back problem     BMI 35.0-35.9,adult 02/16/2020    CAD (coronary artery disease)     mild    Cataract     Cataracts, bilateral     CHF (congestive heart failure) (CMS HCC)     Chronic bronchitis with emphysema (CMS HCC)     Chronic low back pain     Claustrophobia     Depression     Diabetes (CMS  HCC)     Diabetes mellitus, type 2 (CMS HCC)     Encounter for support and coordination of transition of care 11/16/2022    Esophageal reflux     Essential hypertension 01/28/2019    H/O urinary tract infection     HH (hiatus hernia)     History of kidney disease     states, "low kidney functions."    Hypercholesterolemia     Hypertension     Hypertriglyceridemia     Type 2 diabetes mellitus (CMS HCC)            FAMILY HEALTH HISTORY:  Family Medical History:       Problem Relation (Age of Onset)    Bone cancer Father    Diabetes Multiple family members    Hypertension (High Blood Pressure) Multiple family members    No Known Problems Mother    Stomach Cancer Paternal Grandmother            PSYCHOSOCIAL HX:   Social/Lifestyle History:         MEDICATIONS:  Outpatient Medications Marked as Taking for the 08/06/23 encounter (Office Visit) with Forbes Cellar, DO   Medication Sig    aspirin (ECOTRIN) 81 mg Oral Tablet, Delayed Release (E.C.) Take 1 Tablet (81 mg total) by mouth Once a day    calcium carbonate/vitamin D3 (VITAMIN D-3 ORAL) Take 2,000 Int'l Units/day by mouth Once a day    conjugated estrogens (PREMARIN) 0.625 mg/gram Vaginal Cream Insert 0.5 g into the vagina Every night    ezetimibe (ZETIA) 10 mg Oral Tablet Take 1 Tablet (10 mg total) by mouth Every evening    FLUoxetine (PROZAC) 20 mg Oral Capsule Take 1 Capsule (20 mg total) by mouth Once a day    metoprolol succinate (TOPROL-XL) 25 mg Oral Tablet Sustained Release 24 hr Take 1 Tablet (25 mg total) by mouth Every night    omeprazole (PRILOSEC) 20 mg Oral Capsule, Delayed Release(E.C.) Take 1 Capsule (20 mg total) by mouth Once a day    predniSONE (DELTASONE) 5 mg Oral Tablet Take 1 Tablet (5 mg total) by mouth Once a day    trimethoprim (TRIMPEX) 100 mg Oral Tablet Take 1 Tablet (100  mg total) by mouth Once a day    TRULICITY 0.75 mg/0.5 mL Subcutaneous Pen Injector INJECT 0.5 ML SUBCUTANEOUSLY  EVERY 7 DAYS       ALLERGIES:  Allergies   Allergen  Reactions    Amoxicillin  Other Adverse Reaction (Add comment)     Unknown reaction    Augmentin [Amoxicillin-Pot Clavulanate] Nausea/ Vomiting    Statins-Hmg-Coa Reductase Inhibitors Myalgia       REVIEW OF SYSTEMS:  13 systems were reviewed and are positive as follows:      Constitutional: Negative for fever, chills, night sweats, or weight changes.   Eyes: Negative for vision changes.  Cardiovascular: Negative for chest pain or palpitations.  Respiratory: Negative for shortness of breath.  Gastrointestinal: Negative for abdominal pain, nausea, or vomiting.  Genitourinary: Negative for bladder incontinence.   Neurological: Negative for bowel or bladder incontinence, saddle paresthesias, numbness, tingling, headaches, or dizziness.   Skin: Negative for rash.  Psychiatric: Negative for sleep changes.   All other systems reviewed and are negative. Please see scanned new patient packet.    PHYSICAL EXAMINATION:    Vitals: BP (!) 146/49   Pulse 63   Temp 36.4 C (97.6 F)   Ht 1.626 m (5\' 4" )   Wt 97.8 kg (215 lb 9.8 oz)   SpO2 97%   BMI 37.01 kg/m      Body mass index is 37.01 kg/m.    GENERAL: A elderly female, sitting upright, communicative, cooperative, resting in a seated position., in no acute distress and speech is fluent.  PSYCHOLOGICAL: Is alert and oriented, mood and affect is congruent.  There is no abnormality of affect on my inspection. The patient does answer questions appropriately and there are no aberrant pain behaviors.  SKIN: The neck and low back dry, intact, without erythema or rash and without edema.  HEAD/EARS/NOSE/THROAT:  OP clear / well-visualized, normocephalic, atraumatic, pupils are midline, no pupillary constrictions or dilatation at midline and trachea is midline  NECK: No lesions  HEART: Normal heart rate, regular rhythm. and no murmurs, rubs, or gallops.  CHEST WALL AND LUNGS: Chest excursions symmetric bilaterally. Lung sounds clear to auscultation bilaterally,  and no wheezes,  crackles, or rhonchi.  ABDOMEN: The abdominal sounds are present. Soft and non-tender.  NEUROLOGICAL:   2nd: PERRL  3rd,4th,6th:  EOMI, no nystagmus  5th: Facial sensation intact  7th: not assessed appears intact  8th: Hearing grossly intact  9th/ 10th: not assessed   11th: Normal shoulder shrug  12th: not assessed    Motor is 5/5 across all joints of the BUE and BLE;  sensation intact to light touch at BUE and BLE;  reflexes are 1+ at the bilateral patellar tendons. (-) Spurling's FABER (flexion, abduction, external rotation)     maneuver bilaterally (+) Facet loading Lumbar and cervical  MUSCULOSKELETAL: Muscle bulk is well-maintained. TTP.    DATA REVIEW:  Labs:     Lab A1C Results:  HEMOGLOBIN A1C   Date Value Ref Range Status   01/23/2023 6.4 (H) 4.8 - 6.2 % Final   12/10/2022 7.6  Final   03/13/2022 5.9  Final   11/29/2021 6.3 (H) 4.8 - 6.2 % Final   11/29/2020 6.5 (H) 4.8 - 6.2 % Final       POCT A1C Results:      03/13/2022    11:00 AM 10/03/2022     4:00 PM 12/10/2022     9:00 AM   POCT A1c   Time Performed  09:27   A1C 5.9 6.3 7.6       Lab Results   Component Value Date    POCTHA1C 6.3 (A) 10/03/2022     No components found for: "HA1CPOC"     Recent Results (from the past [id number] hour(s))   MRI SPINE LUMBOSACRAL WO CONTRAST    Collection Time: 05/21/23  1:53 PM    Narrative    Cleopatra R Titzer    PROCEDURE DESCRIPTION: MRI SPINE LUMBOSACRAL WO CONTRAST    CLINICAL INDICATION: R26.2: Ambulatory dysfunction    COMPARISON: No prior studies were compared.      FINDINGS: Multiplanar multisequence images lumbar spine obtained without IV contrast enhancement. There is grade 1 anterolisthesis L4 on L5. There is no bone edema. There is disc desiccation at multiple levels throughout the lumbar spine. Mild disc space narrowing is seen. Spinal cord is normal with conus terminating at L1.    There is mild disc bulging L1-2 but no significant spinal stenosis.    Mild disc bulging and degenerative facet arthropathy L2-3  without significant spinal stenosis.    Degenerative facet changes and mild disc bulging L3-4 with mild central canal stenosis and foraminal narrowing.    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    Degenerative facet changes L5-S1 with disc bulging more prominent towards the left side. There is a central disc protrusion or herniation superimposed. There is moderate left foraminal narrowing. There is mild central canal stenosis.    Paraspinal soft tissues appear normal.    No findings to suggest infectious or inflammatory process of the lumbar spine.      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            Radiologist location ID: [id number]       No results found for this or any previous visit (from the past [id number] hour(s)).      ICD-10-CM    1. Radiculopathy, unspecified spinal region  M54.10 EPIDURAL STEROID INJECTION - CAUDAL     EPIDURAL STEROID INJECTION - CAUDAL      2. Lower back pain  M54.50 EPIDURAL STEROID INJECTION - CAUDAL     EPIDURAL STEROID INJECTION - CAUDAL      3. DDD (degenerative disc disease), lumbar  M51.36 EPIDURAL STEROID INJECTION - CAUDAL      4. Lumbar spondylosis  M47.816 EPIDURAL STEROID INJECTION - CAUDAL           ASSESSMENT:    Low back pain    Lumbar radicular syndrome    Lumbar disc disease    Lumbar facet arthropathy    Cervical facet arthropathy      PLAN:   Orders Placed This Encounter    EPIDURAL STEROID INJECTION - CAUDAL    EPIDURAL STEROID INJECTION - CAUDAL      ESI under fluoroscopy and Level/Laterality Caudal with Cath    Consider  MBB Cervical under fluoroscopy and MBB Lumbar under fluoroscopy    The patient verbalized understanding of the above plan and the patient wishes to move forward with the  above noted plan.    Follow up: Return in 4 weeks (on 09/03/2023).    Forbes Cellar, DO 08/06/2023, 13:25

## 2023-08-06 NOTE — Nursing Note (Signed)
Pain and Function:     Joanna Black Pain Rating Scale     On a scale of 0-10, during the past 24 hours, pain has interfered with you usual activity: 0     On a scale of 0-10, during the past 24 hours, pain has interfered with your sleep: 0    On a scale of 0-10, during the past 24 hours, pain has affected your mood: 8     On a scale of 0-10, during the past 24 hours, pain has contributed to your stress: 5     On a scale of 0-10, what is your overall pain Rating: 8

## 2023-08-06 NOTE — Telephone Encounter (Signed)
Prescriber not at this practice.

## 2023-08-07 ENCOUNTER — Encounter (HOSPITAL_BASED_OUTPATIENT_CLINIC_OR_DEPARTMENT_OTHER): Payer: Self-pay

## 2023-08-11 ENCOUNTER — Telehealth (HOSPITAL_BASED_OUTPATIENT_CLINIC_OR_DEPARTMENT_OTHER): Payer: Self-pay | Admitting: Emergency Medicine

## 2023-08-11 NOTE — Telephone Encounter (Signed)
Called patient to schedule Caudal. Spoke with her son, they want to wait to schedule at [facility].       Joanna Black

## 2023-08-13 ENCOUNTER — Telehealth (HOSPITAL_BASED_OUTPATIENT_CLINIC_OR_DEPARTMENT_OTHER): Payer: Self-pay | Admitting: Neurological Surgery

## 2023-08-13 NOTE — Telephone Encounter (Addendum)
Called and spoke to patients son Joanna Black and scheduled patient for 10/27/23 at 1:30 PM. Patients son stated they are waiting to do the injection because Dr. Allyson Sabal is going to do it in [place] but he isn't starting injections at that location until October.     Joanna Black    ----- Message from Clide Dales sent at 07/29/2023 11:40 AM EDT -----    ----- Message -----  From: Joanna Black  Sent: 07/28/2023   4:08 PM EDT  To: Joanna Black      ----- Message -----  From: Daneil Dan, Cordelia Poche  Sent: 07/28/2023  10:16 AM EDT  To: [facility] Clinical Support Staff    Can we get this patient a follow up with Dr. Idolina Primer after her injection?    Thanks!    Daneil Dan, PA-C

## 2023-08-14 ENCOUNTER — Ambulatory Visit: Payer: Medicare PPO | Attending: Nephrology | Admitting: Nephrology

## 2023-08-14 ENCOUNTER — Encounter (INDEPENDENT_AMBULATORY_CARE_PROVIDER_SITE_OTHER): Payer: Self-pay | Admitting: Student in an Organized Health Care Education/Training Program

## 2023-08-14 ENCOUNTER — Ambulatory Visit (INDEPENDENT_AMBULATORY_CARE_PROVIDER_SITE_OTHER): Payer: Medicare PPO | Admitting: Student in an Organized Health Care Education/Training Program

## 2023-08-14 ENCOUNTER — Encounter (HOSPITAL_BASED_OUTPATIENT_CLINIC_OR_DEPARTMENT_OTHER): Payer: Self-pay | Admitting: Nephrology

## 2023-08-14 ENCOUNTER — Other Ambulatory Visit: Payer: Self-pay

## 2023-08-14 VITALS — BP 122/70 | HR 72 | Temp 97.7°F | Ht 64.0 in | Wt 217.8 lb

## 2023-08-14 VITALS — BP 166/84 | HR 79 | Temp 97.1°F | Ht 64.0 in | Wt 218.5 lb

## 2023-08-14 DIAGNOSIS — E1122 Type 2 diabetes mellitus with diabetic chronic kidney disease: Secondary | ICD-10-CM | POA: Insufficient documentation

## 2023-08-14 DIAGNOSIS — Z7985 Long-term (current) use of injectable non-insulin antidiabetic drugs: Secondary | ICD-10-CM | POA: Insufficient documentation

## 2023-08-14 DIAGNOSIS — M810 Age-related osteoporosis without current pathological fracture: Secondary | ICD-10-CM | POA: Insufficient documentation

## 2023-08-14 DIAGNOSIS — M353 Polymyalgia rheumatica: Secondary | ICD-10-CM

## 2023-08-14 DIAGNOSIS — I130 Hypertensive heart and chronic kidney disease with heart failure and stage 1 through stage 4 chronic kidney disease, or unspecified chronic kidney disease: Secondary | ICD-10-CM | POA: Insufficient documentation

## 2023-08-14 DIAGNOSIS — I129 Hypertensive chronic kidney disease with stage 1 through stage 4 chronic kidney disease, or unspecified chronic kidney disease: Secondary | ICD-10-CM

## 2023-08-14 DIAGNOSIS — Z79899 Other long term (current) drug therapy: Secondary | ICD-10-CM | POA: Insufficient documentation

## 2023-08-14 DIAGNOSIS — N1832 Chronic kidney disease, stage 3b (CMS HCC): Secondary | ICD-10-CM

## 2023-08-14 DIAGNOSIS — Z7952 Long term (current) use of systemic steroids: Secondary | ICD-10-CM

## 2023-08-14 DIAGNOSIS — I2510 Atherosclerotic heart disease of native coronary artery without angina pectoris: Secondary | ICD-10-CM | POA: Insufficient documentation

## 2023-08-14 DIAGNOSIS — R7982 Elevated C-reactive protein (CRP): Secondary | ICD-10-CM

## 2023-08-14 DIAGNOSIS — I509 Heart failure, unspecified: Secondary | ICD-10-CM | POA: Insufficient documentation

## 2023-08-14 DIAGNOSIS — N183 Chronic kidney disease, stage 3 unspecified: Secondary | ICD-10-CM | POA: Insufficient documentation

## 2023-08-14 DIAGNOSIS — E119 Type 2 diabetes mellitus without complications: Secondary | ICD-10-CM

## 2023-08-14 MED ORDER — PREDNISONE 5 MG TABLET
ORAL_TABLET | ORAL | 0 refills | Status: DC
Start: 2023-08-14 — End: 2023-11-10

## 2023-08-14 MED ORDER — KEVZARA 200 MG/1.14 ML SUBCUTANEOUS PEN INJECTOR
200.0000 mg | PEN_INJECTOR | SUBCUTANEOUS | 11 refills | Status: DC
Start: 2023-08-14 — End: 2024-02-20
  Filled 2023-08-20: qty 2.28, 28d supply, fill #0

## 2023-08-14 NOTE — Progress Notes (Signed)
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   Objective :  BP (!) 166/84   Pulse 79   Temp 36.2 C (97.1 F)   Ht 1.626 m (5\' 4" )   Wt 99.1 kg (218 lb 7.6 oz)   SpO2 96%   BMI 37.50 kg/m       ALERT IN NO DISTRESS  HEART REGULAR S1 S2  LUNGS CLEAR  EXT NO PEDAL EDEMA  Data reviewed:LAST CREAT 1.53    Current Outpatient Medications   Medication Sig    acetaminophen (TYLENOL) 500 mg Oral Tablet Take 1 Tablet (500 mg total) by mouth Every night as needed Takes 2 at night    albuterol sulfate (PROVENTIL OR VENTOLIN OR PROAIR) 90 mcg/actuation Inhalation oral inhaler Take 1-2 Puffs by inhalation Every 6 hours as needed    albuterol sulfate (PROVENTIL) 2.5 mg /3 mL (0.083 %) Inhalation nebulizer solution INHALE 1 VIAL BY MOUTH VIA NEBULIZER EVERY 4 HOURS AS NEEDED FOR WHEEZING    aspirin (ECOTRIN) 81 mg Oral Tablet, Delayed Release (E.C.) Take 1 Tablet (81 mg total) by mouth Once a day    calcium carbonate/vitamin D3 (VITAMIN D-3 ORAL) Take 2,000 Int'l Units/day by mouth Once a day    conjugated estrogens (PREMARIN) 0.625 mg/gram Vaginal Cream Insert 0.5 g into the vagina Every night    ezetimibe (ZETIA) 10 mg Oral Tablet Take 1 Tablet (10 mg total) by mouth Every evening    ferrous sulfate (FEOSOL) 325 mg (65 mg iron) Oral Tablet TAKE 1 TABLET (325 MG TOTAL) BY MOUTH ONCE A DAY FOR 3 MONTHS    FLUoxetine (PROZAC) 20 mg Oral Capsule Take 1 Capsule (20 mg total) by mouth Once a day    furosemide (LASIX) 20 mg Oral Tablet TAKE 1 TABLET (20 MG TOTAL) BY MOUTH TWICE PER DAY AS NEEDED    magnesium oxide (MAG-OX) 400 mg Oral Tablet Take 1 Tablet (400 mg total) by mouth Three times a day    metoprolol succinate (TOPROL-XL) 25 mg Oral Tablet Sustained  Release 24 hr Take 1 Tablet (25 mg total) by mouth Every night    omeprazole (PRILOSEC) 20 mg Oral Capsule, Delayed Release(E.C.) Take 1 Capsule (20 mg total) by mouth Once a day    predniSONE (DELTASONE) 5 mg Oral Tablet Take 1 Tablet (5 mg total) by mouth Once a day    tiZANidine (ZANAFLEX) 4 mg Oral Tablet TAKE 1 TABLET BY MOUTH THREE TIMES A DAY AS NEEDED FOR MUSCLE CRAMPS STRENGTH: 4 MG    trimethoprim (TRIMPEX) 100 mg Oral Tablet Take 1 Tablet (100 mg total) by mouth Once a day    TRULICITY 0.75 mg/0.5 mL Subcutaneous Pen Injector INJECT 0.5 ML SUBCUTANEOUSLY  EVERY 7 DAYS     Assessment/Plan  Problem List Items Addressed This Visit    None  Visit Diagnoses       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

## 2023-08-14 NOTE — Patient Instructions (Addendum)
Start taking 6 mg prednisone daily (one 5 mg tablet and one 1 mg tablet)    Will taper prednisone once you start the Kevzara injections.     Please call [facility] to ask if you have had the Shingrix vaccine (shingles)

## 2023-08-14 NOTE — Progress Notes (Unsigned)
[name]  [address]  [name] [facility] [id number]    HPI:  This is a case of a 77 y.o. year old female who comes in today for follow-up of polymyalgia rheumatica    Medical conditions notable for:  -hospitalized 10/29/22-11/01/22 for acute hypoxic respiratory failure.  That have significant proximal pain and weakness in her shoulders and hips with elevated inflammatory markers.  Discharged on prednisone 50 mg daily.  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 Over the last 6 months. She can only stand for a minutes. She has shoulder pain. She had significant symptoms in the shoulder and headaches while on 2 mg prednisone so she went back to 5 mg daily. She is back to 4 mg daily. She has been on 4 mg for about 3 weeks. Energy is poor.     Initial HPI listed below  She was having neck and shoulder pain. She had been getting left sided frontal headaches at that time. No jaw locking or issues with her tongue. She had some blurry vision around that time, but no vision loss. Patient is currently on prednisone 5 mg daily for PMR for around a month. Shoulders are back to her baseline. She has had issues with heartburn before but it is well controlled currently on omeprazole. She has had esophageal dilations in the past.     Denies having any shortness of breath, chest pain, abdominal pain, fevers, unintential weight loss.    All systems reviewed and are otherwise unremarkable unless stated above.    Social Hx- no alcohol use, no smoking, resides in [place]   Family Hx- no family hx of RA or SLE    Current Outpatient Medications   Medication Sig Dispense Refill    acetaminophen (TYLENOL) 500 mg Oral Tablet Take 1 Tablet (500 mg total) by mouth Every night as needed Takes 2 at night      albuterol sulfate (PROVENTIL OR VENTOLIN OR PROAIR) 90  mcg/actuation Inhalation oral inhaler Take 1-2 Puffs by inhalation Every 6 hours as needed 1 Each 3    albuterol sulfate (PROVENTIL) 2.5 mg /3 mL (0.083 %) Inhalation nebulizer solution INHALE 1 VIAL BY MOUTH VIA NEBULIZER EVERY 4 HOURS AS NEEDED FOR WHEEZING 225 mL 3    aspirin (ECOTRIN) 81 mg Oral Tablet, Delayed Release (E.C.) Take 1 Tablet (81 mg total) by mouth Once a day      calcium carbonate/vitamin D3 (VITAMIN D-3 ORAL) Take 2,000 Int'l Units/day by mouth Once a day      conjugated estrogens (PREMARIN) 0.625 mg/gram Vaginal Cream Insert 0.5 g into the vagina Every night 30 g 5    ezetimibe (ZETIA) 10 mg Oral Tablet Take 1 Tablet (10 mg total) by mouth Every evening 90 Tablet 1    ferrous sulfate (FEOSOL) 325 mg (65 mg iron) Oral Tablet TAKE 1 TABLET (325 MG TOTAL) BY MOUTH ONCE A DAY FOR 3 MONTHS 90 Tablet 0    FLUoxetine (PROZAC) 20 mg Oral Capsule Take 1 Capsule (20 mg total) by mouth Once a day 90 Capsule 1    furosemide (LASIX) 20 mg Oral Tablet TAKE 1 TABLET (20 MG TOTAL) BY MOUTH TWICE PER DAY AS NEEDED 180 Tablet 1    magnesium oxide (MAG-OX) 400 mg Oral Tablet Take 1  Tablet (400 mg total) by mouth Three times a day 30 Tablet 0    metoprolol succinate (TOPROL-XL) 25 mg Oral Tablet Sustained Release 24 hr Take 1 Tablet (25 mg total) by mouth Every night 90 Tablet 1    omeprazole (PRILOSEC) 20 mg Oral Capsule, Delayed Release(E.C.) Take 1 Capsule (20 mg total) by mouth Once a day 90 Capsule 1    predniSONE (DELTASONE) 5 mg Oral Tablet Take one 5 mg with your 1 mg prednisone tabs which you have at home for a total daily dose of 6 mg daily. 30 Tablet 0    sarilumab (KEVZARA) 200 mg/1.14 mL Subcutaneous Pen Injector Inject 1.1 mL (192.9825 mg total) under the skin Every 14 days 2 Each 11    tiZANidine (ZANAFLEX) 4 mg Oral Tablet TAKE 1 TABLET BY MOUTH THREE TIMES A DAY AS NEEDED FOR MUSCLE CRAMPS STRENGTH: 4 MG 270 Tablet 1    trimethoprim (TRIMPEX) 100 mg Oral Tablet Take 1 Tablet (100 mg total) by mouth  Once a day 90 Tablet 3    TRULICITY 0.75 mg/0.5 mL Subcutaneous Pen Injector INJECT 0.5 ML SUBCUTANEOUSLY  EVERY 7 DAYS 6 mL 3     No current facility-administered medications for this visit.     Allergies   Allergen Reactions    Amoxicillin  Other Adverse Reaction (Add comment)     Unknown reaction    Augmentin [Amoxicillin-Pot Clavulanate] Nausea/ Vomiting    Statins-Hmg-Coa Reductase Inhibitors Myalgia     Past Medical History:   Diagnosis Date    Arthritis     Asthma     Back problem     BMI 35.0-35.9,adult 02/16/2020    CAD (coronary artery disease)     mild    Cataract     Cataracts, bilateral     CHF (congestive heart failure) (CMS HCC)     Chronic bronchitis with emphysema (CMS HCC)     Chronic low back pain     Claustrophobia     Depression     Diabetes (CMS HCC)     Diabetes mellitus, type 2 (CMS HCC)     Encounter for support and coordination of transition of care 11/16/2022    Esophageal reflux     Essential hypertension 01/28/2019    H/O urinary tract infection     HH (hiatus hernia)     History of kidney disease     states, "low kidney functions."    Hypercholesterolemia     Hypertension     Hypertriglyceridemia     Type 2 diabetes mellitus (CMS HCC)          Past Surgical History:   Procedure Laterality Date    CATARACT EXTRACTION, BILATERAL Bilateral     bilateral lens implant    COLONOSCOPY      ENDOSCOPIC ULTRASOUND ESOPHGEAL  04/15/2023    ESOPHAGOGASTRODUODENOSCOPY      HX CARPAL TUNNEL RELEASE      HX CATARACT REMOVAL Right 12/30/2019    HX CATARACT REMOVAL Left 01/06/2020    HX CHOLECYSTECTOMY      HX EXPOSURE TO METAL SHAVINGS      HX HEART CATHETERIZATION      HX HYSTERECTOMY      HX OOPHORECTOMY      HX TAH AND BSO      HX TUBAL LIGATION      HX VEIN STRIPPING      Left leg     PHYSICAL EXAM:  VITALS: BP 122/70   Pulse  72   Temp 36.5 C (97.7 F) (Thermal Scan)   Ht 1.626 m (5\' 4" )   Wt 98.8 kg (217 lb 13 oz)   BMI 37.39 kg/m     General- well appearing woman resting comfortably in  chair  Neurologic- no focal deficits noted, speaks in full sentences and answers questions appropriately   HEENT- non traumatic skull, no scleral icterus, normal appearing oral mucosa, no scalp tenderness,  Lungs- normal work of breathing, CTA bilaterally, no wheezes, no rhonchi  Heart- regular rate, regular rhythm, normal S1 and S2, no murmur no carotid bruit noted, normal radial pulses bilaterally  Abdomen- soft, non tender, non distended,   Skin-  no rashes noted on exposed skin, normal appearing finger nails bilaterally   Musculoskeletal- no synovitis, normal range of motion shoulders bilaterally, no tenderness to subacromial bursa,, 5/5 muscle strength in upper and lower extremities  Psych- appropriate mood and corresponding affect     Labs/imaging:  WBC 11.3, hemoglobin 11.4, platelet count 261   Creatinine 1.5    ESR 15, CRP 29 (CRP 70 from 2 weeks prior)    Region BMD  (g/cm) Young Adult  T-score Age Matched  Z-score   AP Spine 1.085 -0.8 0.0   Left Hip total 0.815 -1.5 -0.5   Left Hip Femoral Neck 0.729 -2.2 -0.9     Hip total           Hip Femoral Neck           Forearm           Forearm            Note: T-Scores (Standard deviation from normal young adult mean)  Normal T-Score > -1 BMD within 1 SD of a "young normal" adult   Osteopenia (low bone mass) T-Score = -1.0 to -2.5 BMD is between 1 and 2.5 SD below that of a "young normal" adult   Osteoporosis T-Score < -2.5 BMD is 2.5 SD or more below that of a "young normal" adult      FRAX* Results:      10-Year Probability of Fracture   Major Osteoporotic Fracture  20.6%% Hip Fracture  5.4%%         Assessment and Plan:  76 y.o. year old female presents today for follow-up of polymyalgia rheumatica.  Patient had significantly elevated inflammatory markers last fall when she was symptomatic from scalp tenderness, headaches, shoulder and girdle stiffness which resolved with high doses of corticosteroids.  Temporal artery biopsy negative.  Patient had good  response to steroid taper however developed worsening shoulder stiffness, pain, and headaches when she went from 3 mg daily prednisone to 2 mg daily prednisone.  Patient had another reoccurrence of symptoms with prednisone taper.  She was currently on 6 mg prednisone and doing well.  Recommend initiation of Kevzara given she has been refractory to cortisone tapers.  Plan to continue prednisone 6 mg daily final taper to be determined once Kevzara started.  Will plan to initiate Prolia for treatment of osteoporosis.  Handout on Prolia provided.      1. Polymyalgia rheumatica (CMS HCC)  - MONOCLONAL GAMMOPATHY PROFILE WITH SPEP, FLC, AND IMMUNOTYPING REFLEX; Future  - CBC; Future  - BASIC METABOLIC PANEL; Future  - HEPATITIS B SURFACE ANTIGEN; Future  - HEPATITIS B SURFACE ANTIBODY; Future  - HEPATITIS C ANTIBODY SCREEN WITH REFLEX TO HCV PCR; Future  - QUANTIFERON TB GOLD PLUS, BLOOD; Future  - sarilumab (KEVZARA) 200 mg/1.14 mL Subcutaneous  Pen Injector; Inject 1.1 mL (192.9825 mg total) under the skin Every 14 days  Dispense: 2 Each; Refill: 11  - predniSONE (DELTASONE) 5 mg Oral Tablet; Take one 5 mg with your 1 mg prednisone tabs which you have at home for a total daily dose of 6 mg daily.  Dispense: 30 Tablet; Refill: 0  - C-REACTIVE PROTEIN(CRP),INFLAMMATION; Future    2. Elevated C-reactive protein (CRP)  - MONOCLONAL GAMMOPATHY PROFILE WITH SPEP, FLC, AND IMMUNOTYPING REFLEX; Future  - CBC; Future  - BASIC METABOLIC PANEL; Future    3. Osteoporosis, unspecified osteoporosis type, unspecified pathological fracture presence  - MONOCLONAL GAMMOPATHY PROFILE WITH SPEP, FLC, AND IMMUNOTYPING REFLEX; Future  - CBC; Future  - BASIC METABOLIC PANEL; Future  - VITAMIN D 25 TOTAL; Future  Prolia Justification    Osteoporosis treatment start date:  07/2023    Failed/Intolerance to other treatments:  Creatinine clearance less than 35    Reason for discontinuation of previous therapies:  Creatinine clearance less than  35    Indications for Prolia:  Creatinine clearance less than 35, osteoporosis    Contraindications to alternative therapies:  As stated above 3 times  Treatment: Prolia 60 mg/mL SQ injection repeat every six (6) months    Labs:  Completed  Currently taking calcium at least 1,000 mg calcium daily Yes  Currently taking vitamin D 1,000 units daily: Yes    Diagnosis: Osteoporosis at High Risk for fracture.    Documentation to support medical necessity:  DEXA scan above    Feliz Beam, MD  08/15/2023, 11:57       Return in about 3 months (around 11/14/2023) for In Person Visit.    Feliz Beam, MD

## 2023-08-15 ENCOUNTER — Other Ambulatory Visit: Payer: Self-pay

## 2023-08-15 ENCOUNTER — Encounter (INDEPENDENT_AMBULATORY_CARE_PROVIDER_SITE_OTHER): Payer: Self-pay | Admitting: Student in an Organized Health Care Education/Training Program

## 2023-08-15 ENCOUNTER — Telehealth (INDEPENDENT_AMBULATORY_CARE_PROVIDER_SITE_OTHER): Payer: Self-pay | Admitting: Student in an Organized Health Care Education/Training Program

## 2023-08-15 DIAGNOSIS — M810 Age-related osteoporosis without current pathological fracture: Secondary | ICD-10-CM

## 2023-08-15 NOTE — Telephone Encounter (Signed)
[id number] Prolia has been approved from 08/15/2023 to 08/14/2024  Visit # 2  Site: [facility]  Auth # [id number]  Approval scanned into media     Joanna Black Joanna Denman George, LPN  6/43/32,  9:51 PM   Please forward order to [facility] infusion center, thank you

## 2023-08-15 NOTE — Telephone Encounter (Signed)
NEW SITE OF SERVICE    [phone] Prolia has been approved from 08/15/2023 to 08/14/2024  Visit # 2  Site: [facility] # [id number]    Prolia Justification: 08/14/2023 clinic note  Kailen Name Antionette Britlee Skolnik, LPN  08/21/55,  2:13 PM

## 2023-08-15 NOTE — Telephone Encounter (Signed)
Prior authorization for [facility] submitted electronically on 08/15/2023 through EPA. Waiting for response from payor.    Sheela Stack, Pharmacy Technician

## 2023-08-18 ENCOUNTER — Other Ambulatory Visit (HOSPITAL_COMMUNITY): Payer: Medicare PPO

## 2023-08-18 ENCOUNTER — Other Ambulatory Visit: Payer: Medicare PPO | Attending: Student in an Organized Health Care Education/Training Program

## 2023-08-18 ENCOUNTER — Other Ambulatory Visit: Payer: Self-pay

## 2023-08-18 DIAGNOSIS — M353 Polymyalgia rheumatica: Secondary | ICD-10-CM | POA: Insufficient documentation

## 2023-08-18 DIAGNOSIS — E1122 Type 2 diabetes mellitus with diabetic chronic kidney disease: Secondary | ICD-10-CM | POA: Insufficient documentation

## 2023-08-18 DIAGNOSIS — N183 Chronic kidney disease, stage 3 unspecified: Secondary | ICD-10-CM | POA: Insufficient documentation

## 2023-08-18 DIAGNOSIS — E1169 Type 2 diabetes mellitus with other specified complication: Secondary | ICD-10-CM | POA: Insufficient documentation

## 2023-08-18 DIAGNOSIS — M810 Age-related osteoporosis without current pathological fracture: Secondary | ICD-10-CM | POA: Insufficient documentation

## 2023-08-18 DIAGNOSIS — E119 Type 2 diabetes mellitus without complications: Secondary | ICD-10-CM | POA: Insufficient documentation

## 2023-08-18 DIAGNOSIS — E785 Hyperlipidemia, unspecified: Secondary | ICD-10-CM | POA: Insufficient documentation

## 2023-08-18 DIAGNOSIS — R7982 Elevated C-reactive protein (CRP): Secondary | ICD-10-CM | POA: Insufficient documentation

## 2023-08-18 LAB — CBC WITH DIFF
BASOPHIL #: 0.1 10*3/uL (ref 0.00–0.20)
BASOPHIL %: 1 % (ref 0–2)
EOSINOPHIL #: 0.5 10*3/uL (ref 0.00–4.00)
EOSINOPHIL %: 5 % — ABNORMAL HIGH (ref 0–4)
HCT: 33.8 % — ABNORMAL LOW (ref 36.0–47.0)
HGB: 10.9 g/dL — ABNORMAL LOW (ref 12.0–15.0)
LYMPHOCYTE #: 2.4 10*3/uL (ref 1.00–3.80)
LYMPHOCYTE %: 25 % (ref 20–35)
MCH: 28.9 pg (ref 27.0–32.0)
MCHC: 32.1 g/dL (ref 32.0–36.0)
MCV: 89.8 fL (ref 80.0–100.0)
MONOCYTE #: 0.6 10*3/uL (ref 0.00–1.40)
MONOCYTE %: 7 % (ref 3–13)
MPV: 8.2 fL (ref 6.6–9.3)
NEUTROPHIL #: 5.8 10*3/uL (ref 2.40–7.60)
NEUTROPHIL %: 62 % (ref 50–70)
PLATELETS: 424 10*3/uL (ref 140–450)
RBC: 3.76 10*6/uL — ABNORMAL LOW (ref 4.20–5.40)
RDW: 18.6 % — ABNORMAL HIGH (ref 12.0–15.0)
WBC: 9.4 10*3/uL (ref 4.8–10.8)

## 2023-08-18 LAB — MICROALBUMIN/CREATININE RATIO, URINE, RANDOM
CREATININE RANDOM URINE: 172 mg/dL
MICROALBUMIN RANDOM URINE: 2.7 mg/dL
MICROALBUMIN/CREATININE RATIO RANDOM URINE: 15.7 mg/g (ref <30.0)

## 2023-08-18 LAB — COMPREHENSIVE METABOLIC PANEL, NON-FASTING
ALBUMIN: 3.1 g/dL — ABNORMAL LOW (ref 3.4–4.8)
ALKALINE PHOSPHATASE: 90 U/L (ref 55–145)
ALT (SGPT): 12 U/L (ref 8–22)
ANION GAP: 13 mmol/L (ref 4–13)
AST (SGOT): 14 U/L (ref 8–45)
BILIRUBIN TOTAL: 0.3 mg/dL (ref 0.3–1.3)
BUN/CREA RATIO: 16 (ref 6–22)
BUN: 29 mg/dL — ABNORMAL HIGH (ref 8–25)
CALCIUM: 9.1 mg/dL (ref 8.6–10.3)
CHLORIDE: 100 mmol/L (ref 96–111)
CO2 TOTAL: 26 mmol/L (ref 23–31)
CREATININE: 1.87 mg/dL — ABNORMAL HIGH (ref 0.60–1.05)
ESTIMATED GFR - FEMALE: 27 mL/min/BSA — ABNORMAL LOW (ref >=60)
GLUCOSE: 137 mg/dL — ABNORMAL HIGH (ref 65–125)
POTASSIUM: 4.1 mmol/L (ref 3.5–5.1)
PROTEIN TOTAL: 6.9 g/dL (ref 6.0–8.0)
SODIUM: 139 mmol/L (ref 136–145)

## 2023-08-18 LAB — LIPID PANEL
CHOL/HDL RATIO: 3.7
CHOLESTEROL: 159 mg/dL (ref 100–200)
HDL CHOL: 43 mg/dL — ABNORMAL LOW (ref >=50)
LDL CALC: 88 mg/dL (ref <100)
NON-HDL: 116 mg/dL (ref <=190)
TRIGLYCERIDES: 160 mg/dL — ABNORMAL HIGH (ref <150)
VLDL CALC: 25 mg/dL (ref <30)

## 2023-08-18 LAB — HGA1C (HEMOGLOBIN A1C WITH EST AVG GLUCOSE)
ESTIMATED AVERAGE GLUCOSE: 157 mg/dL
HEMOGLOBIN A1C: 7.1 % — ABNORMAL HIGH (ref 4.8–6.2)

## 2023-08-18 LAB — MAGNESIUM: MAGNESIUM: 2 mg/dL (ref 1.8–2.6)

## 2023-08-18 LAB — THYROID STIMULATING HORMONE (SENSITIVE TSH): TSH: 3.095 u[IU]/mL (ref 0.350–4.940)

## 2023-08-18 LAB — VITAMIN D 25 TOTAL: VITAMIN D 25, TOTAL: 50.8 ng/mL (ref 20.0–100.0)

## 2023-08-18 LAB — C-REACTIVE PROTEIN(CRP),INFLAMMATION: CRP INFLAMMATION: 34.5 mg/L — ABNORMAL HIGH (ref <8.0)

## 2023-08-19 ENCOUNTER — Other Ambulatory Visit: Payer: Self-pay

## 2023-08-19 ENCOUNTER — Encounter (INDEPENDENT_AMBULATORY_CARE_PROVIDER_SITE_OTHER): Payer: Self-pay | Admitting: Student in an Organized Health Care Education/Training Program

## 2023-08-19 LAB — PROTEIN FOR ELECTROPHORESIS: PROTEIN TOTAL: 6.3 g/dL (ref 5.6–7.6)

## 2023-08-19 LAB — HEPATITIS C ANTIBODY SCREEN WITH REFLEX TO HCV PCR: HCV ANTIBODY QUALITATIVE: NEGATIVE

## 2023-08-19 LAB — HEPATITIS B SURFACE ANTIGEN: HBV SURFACE ANTIGEN QUALITATIVE: NEGATIVE

## 2023-08-19 LAB — HEPATITIS B SURFACE ANTIBODY: HBV SURFACE ANTIBODY QUANTITATIVE: 0.11 m[IU]/mL (ref <8.00)

## 2023-08-19 LAB — KAPPA AND LAMBDA FREE LIGHT CHAINS, SERUM
KAPPA FREE LIGHT CHAINS: 10.43 mg/dL — ABNORMAL HIGH (ref 1.25–3.25)
KAPPA/LAMBDA FLC RATIO: 4.68 — ABNORMAL HIGH (ref 0.80–2.10)
LAMBDA FREE LIGHT CHAINS: 2.23 mg/dL (ref 0.60–2.70)

## 2023-08-19 LAB — ALBUMIN FOR ELECTROPHORESIS: ALBUMIN: 3.1 g/dL — ABNORMAL LOW (ref 3.4–4.8)

## 2023-08-19 NOTE — Telephone Encounter (Signed)
Prior authorization for Joanna Black resubmitted electronically on 08/19/2023 through CoverMyMeds. Key [id number]. Waiting for response from payor.    Sheela Stack, Pharmacy Technician

## 2023-08-20 ENCOUNTER — Other Ambulatory Visit (INDEPENDENT_AMBULATORY_CARE_PROVIDER_SITE_OTHER): Payer: Self-pay | Admitting: Student in an Organized Health Care Education/Training Program

## 2023-08-20 ENCOUNTER — Telehealth (INDEPENDENT_AMBULATORY_CARE_PROVIDER_SITE_OTHER): Payer: Self-pay | Admitting: Student in an Organized Health Care Education/Training Program

## 2023-08-20 ENCOUNTER — Other Ambulatory Visit: Payer: Self-pay

## 2023-08-20 DIAGNOSIS — D472 Monoclonal gammopathy: Secondary | ICD-10-CM

## 2023-08-20 DIAGNOSIS — R7982 Elevated C-reactive protein (CRP): Secondary | ICD-10-CM

## 2023-08-20 LAB — MONOCLONAL GAMMOPATHY PROFILE WITH IMMUNOTYPING REFLEX
ALBUMIN: 3.1 g/dL — ABNORMAL LOW (ref 3.4–4.8)
KAPPA FREE LIGHT CHAINS: 10.43 mg/dL — ABNORMAL HIGH (ref 1.25–3.25)
KAPPA/LAMBDA FLC RATIO: 4.68 — ABNORMAL HIGH (ref 0.80–2.10)
LAMBDA FREE LIGHT CHAINS: 2.23 mg/dL (ref 0.60–2.70)
TOTAL PROTEIN: 6.3 g/dL

## 2023-08-20 LAB — IMMUNOSUBTRACTION, SERUM: PATHOLOGIST INTERPRETATION (IMMUNOTYPING): ABNORMAL — AB

## 2023-08-20 LAB — QUANTIFERON TB2: QFT TB2: 0 IU/mL

## 2023-08-20 LAB — QUANTIFERON TB1: QFT TB1: 0.01 IU/mL

## 2023-08-20 LAB — QUANTIFERON
QFT MITOGEN: >10 IU/mL
QFT NIL: 0.0001 IU/mL
QFT QUALITATIVE: NEGATIVE
QFT TB1: 0.01 IU/mL
QFT TB2: 0 IU/mL

## 2023-08-20 LAB — QUANTIFERON TB MITOGEN: QFT MITOGEN: >10 IU/mL

## 2023-08-20 LAB — QUANTIFERON TB NIL: QFT NIL: 0.0001 IU/mL

## 2023-08-20 NOTE — Telephone Encounter (Signed)
Spoke to patient's son Jonny Ruiz. Notified him that I have mailed out the Kevzara PAP to assist with high copay. Advised to complete top portion and return in yellow envelope. He demonstrated understanding.  Madlyn Frankel Scarcelli, LPN  1/61/0960,  16:34

## 2023-08-20 NOTE — Result Encounter Note (Signed)
Left message on voicemail for patient to contact office about lab results.  April Haney, LPN

## 2023-08-20 NOTE — Telephone Encounter (Signed)
-----   Message from Carson G sent at 08/20/2023  3:51 PM EDT -----  PAP  ----- Message -----  From: Sheela Stack, Pharmacy Technician  Sent: 08/20/2023   3:14 PM EDT  To: [place] [facility] Authorization Nurse    ##Please assist patient in applying for the PAP.##

## 2023-08-20 NOTE — Telephone Encounter (Signed)
Specialty Pharmacy Note    Prior Authorization for medication Carlis Abbott has been approved by payor [employer] until 12/23/2023.  Approval notice has been scanned into Epic and can be found in the Media tab. Message to clinic to look into PAP due to high copay.     This signed encounter has been routed to the provider's office noting the completion of prior authorization and informing that further action is needed from clinic due to patient needing assistance enrolling into the PAP due to high copay.    If you have any questions, don't hesitate to contact the Specialty Pharmacy Sheela Stack, Pharmacy Technician

## 2023-08-20 NOTE — Telephone Encounter (Signed)
[name]; primary contact for patient called and updated regarding the SPEP findings.  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  Overall ratio does not look overwhelmingly concerning but she should be evaluated for MGUS or multiple myeloma by Hematology.  Will plan to have patient complete UPEP non urgently.

## 2023-08-21 ENCOUNTER — Other Ambulatory Visit (HOSPITAL_COMMUNITY): Payer: Medicare PPO

## 2023-08-22 NOTE — Result Encounter Note (Signed)
Patient notified of lab results and provider notes.  Patient voiced understanding and no other concerns voiced at this time.    April Haney, LPN

## 2023-08-27 ENCOUNTER — Other Ambulatory Visit: Payer: Self-pay

## 2023-09-08 ENCOUNTER — Ambulatory Visit (HOSPITAL_COMMUNITY): Payer: Self-pay | Admitting: Physician Assistant

## 2023-09-09 ENCOUNTER — Other Ambulatory Visit (INDEPENDENT_AMBULATORY_CARE_PROVIDER_SITE_OTHER): Payer: Self-pay | Admitting: Family Medicine

## 2023-09-09 DIAGNOSIS — F325 Major depressive disorder, single episode, in full remission: Secondary | ICD-10-CM

## 2023-09-09 MED ORDER — FLUOXETINE 20 MG CAPSULE
20.0000 mg | ORAL_CAPSULE | Freq: Every day | ORAL | 1 refills | Status: DC
Start: 2023-09-09 — End: 2024-05-24

## 2023-09-12 ENCOUNTER — Telehealth (HOSPITAL_BASED_OUTPATIENT_CLINIC_OR_DEPARTMENT_OTHER): Payer: Self-pay | Admitting: Emergency Medicine

## 2023-09-12 ENCOUNTER — Ambulatory Visit (HOSPITAL_COMMUNITY): Payer: Self-pay | Admitting: Internal Medicine

## 2023-09-12 NOTE — Telephone Encounter (Signed)
Called patient to schedule CAUDAL 09/23/23 at 10 arrive at 8 AT [place], follow up 10/13/23 at 245 with [facility] in [place] with her son.   Went over procedure instructions with patient who verbalized understanding and had no further questions. Verified insurance [place] [employer]IC[employer] and medication holds,  NONE. Mailed instructions and appointment reminders to patient.         Otilio Connors

## 2023-09-15 ENCOUNTER — Ambulatory Visit (HOSPITAL_COMMUNITY): Payer: Medicare PPO

## 2023-09-17 ENCOUNTER — Encounter (INDEPENDENT_AMBULATORY_CARE_PROVIDER_SITE_OTHER): Payer: Self-pay | Admitting: Student in an Organized Health Care Education/Training Program

## 2023-09-17 ENCOUNTER — Inpatient Hospital Stay (HOSPITAL_COMMUNITY)
Admission: RE | Admit: 2023-09-17 | Discharge: 2023-09-17 | Disposition: A | Discharge status: Home / Self Care | Payer: Medicare PPO | Source: Ambulatory Visit

## 2023-09-17 ENCOUNTER — Ambulatory Visit: Payer: Medicare PPO | Attending: Internal Medicine | Admitting: Internal Medicine

## 2023-09-17 ENCOUNTER — Other Ambulatory Visit: Payer: Self-pay

## 2023-09-17 VITALS — BP 149/74 | HR 100 | Temp 98.0°F | Resp 16 | Ht 64.0 in | Wt 214.7 lb

## 2023-09-17 DIAGNOSIS — D649 Anemia, unspecified: Secondary | ICD-10-CM

## 2023-09-17 DIAGNOSIS — I130 Hypertensive heart and chronic kidney disease with heart failure and stage 1 through stage 4 chronic kidney disease, or unspecified chronic kidney disease: Secondary | ICD-10-CM | POA: Insufficient documentation

## 2023-09-17 DIAGNOSIS — E1136 Type 2 diabetes mellitus with diabetic cataract: Secondary | ICD-10-CM | POA: Insufficient documentation

## 2023-09-17 DIAGNOSIS — D472 Monoclonal gammopathy: Secondary | ICD-10-CM | POA: Insufficient documentation

## 2023-09-17 DIAGNOSIS — E8809 Other disorders of plasma-protein metabolism, not elsewhere classified: Secondary | ICD-10-CM

## 2023-09-17 DIAGNOSIS — E1122 Type 2 diabetes mellitus with diabetic chronic kidney disease: Secondary | ICD-10-CM | POA: Insufficient documentation

## 2023-09-17 LAB — IRON TRANSFERRIN AND TIBC
IRON (TRANSFERRIN) SATURATION: 7 % — ABNORMAL LOW (ref 20–50)
IRON: 22 ug/dL — ABNORMAL LOW (ref 45–170)
TOTAL IRON BINDING CAPACITY: 329 ug/dL (ref 224–476)
TRANSFERRIN: 235 mg/dL (ref 160–340)

## 2023-09-17 LAB — HEPATITIS A (HAV) IGM ANTIBODY: HAV IGM: NEGATIVE

## 2023-09-17 LAB — LDH: LDH: 237 U/L — ABNORMAL HIGH (ref 125–220)

## 2023-09-17 LAB — HEPATITIS B SURFACE ANTIBODY: HBV SURFACE ANTIBODY QUANTITATIVE: 0.26 m[IU]/mL (ref <8.00)

## 2023-09-17 LAB — HEPATITIS C ANTIBODY SCREEN WITH REFLEX TO HCV PCR: HCV ANTIBODY QUALITATIVE: NEGATIVE

## 2023-09-17 LAB — FERRITIN: FERRITIN: 251 ng/mL — ABNORMAL HIGH (ref 5–200)

## 2023-09-17 LAB — HEPATITIS B CORE IGM, AB: HBV CORE IGM ANTIBODY QUALITATIVE: NEGATIVE

## 2023-09-17 LAB — ALBUMIN FOR ELECTROPHORESIS: ALBUMIN: 3.3 g/dL — ABNORMAL LOW (ref 3.4–4.8)

## 2023-09-17 LAB — PROTEIN FOR ELECTROPHORESIS: PROTEIN TOTAL: 7.4 g/dL (ref 5.6–7.6)

## 2023-09-17 LAB — HEPATITIS B SURFACE ANTIGEN: HBV SURFACE ANTIGEN QUALITATIVE: NEGATIVE

## 2023-09-17 NOTE — Cancer Center Note (Signed)
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 female accompanied by her husband who provided some of the history.  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  History of anemia and chronic kidney disease.  She denies any bone pains.  No weight loss.  No headache, dizziness or falls.  No other complaints    REVIEW OF SYSTEMS:  General: (-) pain. (-) fevers (-) chills. (-) weight loss. (-) fatigue.  Lymphatic: (-) palpable masses. (-) night sweats.  Heme: (-) easy bruising (-) bleeding.  (-) recurrent infections.   HEENT. (-) vision changes (-) hearing changes. (-) dysphagia. (-) sore throat.   Heart: (-) chest pain. (-) palpitation. (-) orthopnea. (-) LE edema.   Lungs: (-) dyspnea (on exertion) (-) hemoptysis. (-) cough.   Abdomen: (-) poor appetite. (-) abdominal pain. (-) nausea (-) vomiting. (-) diarrhea. (-) constipation.   GU: (-) dysuria (-) Urgency. (-) Hematuria.   MS. (-) joint pain (-) ext swelling. (-) Back pain.    Dermatologic: (-) rashes. (-) pruritus.   Psychiatric: (-) Depression. (-) anxiety. (-) insomnia.   Neurologic: (-) headaches. (-) neuropathy. (-) weakness. (-) memory problems.  Other review of systems negative.     PAST MEDICAL HISTORY:  Past Medical History:   Diagnosis Date    Arthritis     Asthma     Back problem     BMI 35.0-35.9,adult 02/16/2020    CAD (coronary artery disease)     mild    Cataract     Cataracts, bilateral     CHF (congestive heart failure) (CMS HCC)     Chronic bronchitis with emphysema (CMS HCC)     Chronic low back pain     Claustrophobia      Depression     Diabetes (CMS HCC)     Diabetes mellitus, type 2 (CMS HCC)     Encounter for support and coordination of transition of care 11/16/2022    Esophageal reflux     Essential hypertension 01/28/2019    H/O urinary tract infection     HH (hiatus hernia)     History of kidney disease     states, "low kidney functions."    Hypercholesterolemia     Hypertension     Hypertriglyceridemia     Type 2 diabetes mellitus (CMS HCC)          MEDICATIONS:  Current Outpatient Medications   Medication Sig    acetaminophen (TYLENOL) 500 mg Oral Tablet Take 1 Tablet (500 mg total) by mouth Every night as needed Takes 2 at night    albuterol sulfate (PROVENTIL OR VENTOLIN OR PROAIR) 90 mcg/actuation Inhalation oral inhaler Take 1-2 Puffs by inhalation Every 6 hours as needed    albuterol sulfate (PROVENTIL) 2.5 mg /3 mL (0.083 %) Inhalation nebulizer solution INHALE 1 VIAL BY MOUTH VIA NEBULIZER EVERY 4 HOURS AS NEEDED FOR WHEEZING    aspirin (ECOTRIN) 81 mg Oral Tablet, Delayed Release (E.C.) Take 1 Tablet (81 mg total) by mouth Once a day    calcium carbonate/vitamin D3 (VITAMIN D-3  ORAL) Take 2,000 Int'l Units/day by mouth Once a day    conjugated estrogens (PREMARIN) 0.625 mg/gram Vaginal Cream Insert 0.5 g into the vagina Every night    ezetimibe (ZETIA) 10 mg Oral Tablet Take 1 Tablet (10 mg total) by mouth Every evening    ferrous sulfate (FEOSOL) 325 mg (65 mg iron) Oral Tablet TAKE 1 TABLET (325 MG TOTAL) BY MOUTH ONCE A DAY FOR 3 MONTHS    FLUoxetine (PROZAC) 20 mg Oral Capsule Take 1 Capsule (20 mg total) by mouth Once a day    furosemide (LASIX) 20 mg Oral Tablet TAKE 1 TABLET (20 MG TOTAL) BY MOUTH TWICE PER DAY AS NEEDED    magnesium oxide (MAG-OX) 400 mg Oral Tablet Take 1 Tablet (400 mg total) by mouth Three times a day    metoprolol succinate (TOPROL-XL) 25 mg Oral Tablet Sustained Release 24 hr Take 1 Tablet (25 mg total) by mouth Every night    omeprazole (PRILOSEC) 20 mg Oral Capsule, Delayed  Release(E.C.) Take 1 Capsule (20 mg total) by mouth Once a day    predniSONE (DELTASONE) 5 mg Oral Tablet Take one 5 mg with your 1 mg prednisone tabs which you have at home for a total daily dose of 6 mg daily.    sarilumab (KEVZARA) 200 mg/1.14 mL Subcutaneous Pen Injector Inject 1 pen (200 mg) subcutaneously (under the skin) once every 14 days    tiZANidine (ZANAFLEX) 4 mg Oral Tablet TAKE 1 TABLET BY MOUTH THREE TIMES A DAY AS NEEDED FOR MUSCLE CRAMPS STRENGTH: 4 MG    trimethoprim (TRIMPEX) 100 mg Oral Tablet Take 1 Tablet (100 mg total) by mouth Once a day    TRULICITY 0.75 mg/0.5 mL Subcutaneous Pen Injector INJECT 0.5 ML SUBCUTANEOUSLY  EVERY 7 DAYS       ALLERGIES:  Allergies   Allergen Reactions    Amoxicillin  Other Adverse Reaction (Add comment)     Unknown reaction    Augmentin [Amoxicillin-Pot Clavulanate] Nausea/ Vomiting    Statins-Hmg-Coa Reductase Inhibitors Myalgia     PAST SURGICAL HISTORY:  Past Surgical History:   Procedure Laterality Date    CATARACT EXTRACTION, BILATERAL Bilateral     bilateral lens implant    COLONOSCOPY      ENDOSCOPIC ULTRASOUND ESOPHGEAL  04/15/2023    ESOPHAGOGASTRODUODENOSCOPY      HX CARPAL TUNNEL RELEASE      HX CATARACT REMOVAL Right 12/30/2019    HX CATARACT REMOVAL Left 01/06/2020    HX CHOLECYSTECTOMY      HX EXPOSURE TO METAL SHAVINGS      HX HEART CATHETERIZATION      HX HYSTERECTOMY      HX OOPHORECTOMY      HX TAH AND BSO      HX TUBAL LIGATION      HX VEIN STRIPPING      Left leg         SOCIAL HISTORY:  Social History     Socioeconomic History    Marital status: Married     Spouse name: Duke Salvia    Number of children: 3    Years of education: 13    Highest education level: High school graduate   Occupational History    Occupation: Retired   Tobacco Use    Smoking status: Never     Passive exposure: Never    Smokeless tobacco: Never   Vaping Use    Vaping status: Never Used   Substance and Sexual Activity  Alcohol use: No     Alcohol/week: 0.0 standard  drinks of alcohol    Drug use: No    Sexual activity: Not Currently     Partners: Male   Other Topics Concern    Uses Cane Not Asked    Uses walker Not Asked    Uses wheelchair Not Asked    Right hand dominant Yes    Left hand dominant Not Asked    Ambidextrous Not Asked    Shift Work Not Asked    Unusual Sleep-Wake Schedule Not Asked    Ability to Walk 1 Flight of Steps without SOB/CP No    Routine Exercise Not Asked    Ability to Walk 2 Flight of Steps without SOB/CP Not Asked    Unable to Ambulate Not Asked    Total Care Not Asked    Ability To Do Own ADL's Yes    Uses Walker Not Asked    Other Activity Level Not Asked    Uses Cane Not Asked   Social History Narrative    MARRIED.  Lives in single story home.  Heats home with electric and has well water.        April Haney, LPN  5/40/9811, 14:01     Social Determinants of Health     Financial Resource Strain: Low Risk  (04/17/2023)    Financial Resource Strain     SDOH Financial: No   Transportation Needs: Low Risk  (04/17/2023)    Transportation Needs     SDOH Transportation: No   Social Connections: Low Risk  (04/17/2023)    Social Connections     SDOH Social Isolation: 5 or more times a week   Intimate Partner Violence: Low Risk  (04/17/2023)    Intimate Partner Violence     SDOH Domestic Violence: No   Housing Stability: Low Risk  (04/17/2023)    Housing Stability     SDOH Housing Situation: I have housing.     SDOH Housing Worry: No     FAMILY HISTORY:  Family Medical History:       Problem Relation (Age of Onset)    Bone cancer Father    Diabetes Multiple family members    Hypertension (High Blood Pressure) Multiple family members    No Known Problems Mother    Stomach Cancer Paternal Grandmother              PHYSICAL EXAMINATION:  Vitals:    09/17/23 1500   BP: (!) 149/74   Pulse: 100   Resp: 16   Temp: 36.7 C (98 F)   SpO2: 100%   Weight: 97.4 kg (214 lb 11.7 oz)   Height: 1.626 m (5\' 4" )   BMI: 36.94   ECOG PS 0 - Fully active, able to carry on all  pre-disease performance without restriction.     General: appears in good health sitting comfortably in the chair  Eyes: Pupils equal and round, reactive to light and accomodation.   HENT:ENT without erythema or injection, mucous membranes moist.  Neck: No JVD or thyromegaly or lymphadenopathy  Lungs: clear to auscultation and percussion bilaterally.   Cardiovascular:    Heart regular rate and rhythm without murmer and    Vascular   pulses 2+ throughout  Abdomen: soft, non-tender, bowel sounds normal, non-tender and non-distended  Extremities: extremities normal, atraumatic, no cyanosis or edema  Skin: Skin warm and dry  Neurologic: CN II - XII grossly intact   Psychiatric: normal affect, behavior, memory,  thought content, judgement, and speech.        LABORATORY:  No results found for this or any previous visit (from the past 72 hour(s)).    IMAGING:  I have reviewed the imaging studies and discussed them with the patient.    ASSESSMENT/PLAN: 77 y.o. 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    Patient Active Problem List   Diagnosis    Type 2 diabetes mellitus with stage 3a chronic kidney disease, without long-term current use of insulin (CMS HCC)    Cervical pain (neck)    Thoracic back pain    Chronic bilateral low back pain    Essential hypertension    Cardiomyopathy, dilated, nonischemic (CMS HCC)    Chronic GERD    Chronic systolic congestive heart failure (CMS HCC)    BMI 35.0-35.9,adult    Mixed hyperlipidemia    CKD (chronic kidney disease) stage 3, 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/min (CMS HCC)    History of colon polyps    Colon cancer screening    Gastroesophageal reflux disease    Dysphagia    Schatzki's ring    Chronic diarrhea    Diverticulosis of colon    Hypoxia    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 Density of the M protein is 0.5 g/dL, estimated from the tracing.   - immuno typing showed IgG kappa monoclonal immunoglobulin identified with immunosubtraction in this serum specimen. The clone migrates in the mid-gamma region.   - kappa/lambda free light chain ratio 4.68  - noticed history of chronic kidney disease and normocytic anemia  - rule out multiple myeloma  - patient does not want to have bone marrow biopsy done  - proceed with PET-CT head to toe  - order SPEP UPEP/quantitative immunoglobulins  - return to clinic in about 4-5 weeks to discuss the PET-CT result and further recommendations    Normocytic anemia  - noticed iron deficiency in the past  - repeat iron studies      On the day of the encounter, a total of  60 minutes was spent on this patient encounter including review of historical information, examination, documentation and post-visit activities.      Standley Dakins MD  [facility]

## 2023-09-18 ENCOUNTER — Encounter (HOSPITAL_COMMUNITY): Payer: Self-pay | Admitting: Internal Medicine

## 2023-09-18 LAB — IMMUNOGLOBULIN G (IGG), SERUM: IMMUNOGLOBULIN G (IGG): 1533 mg/dL (ref 610–1616)

## 2023-09-18 LAB — IMMUNOGLOBULIN A (IGA), SERUM: IMMUNOGLOBULIN A (IGA): 138 mg/dL (ref 85–499)

## 2023-09-18 LAB — MONOCLONAL GAMMOPATHY PROFILE WITH IMMUNOTYPING REFLEX
ALBUMIN: 3.3 g/dL — ABNORMAL LOW (ref 3.4–4.8)
KAPPA FREE LIGHT CHAINS: 14.93 mg/dL — ABNORMAL HIGH (ref 1.25–3.25)
KAPPA/LAMBDA FLC RATIO: 5.95 — ABNORMAL HIGH (ref 0.80–2.10)
LAMBDA FREE LIGHT CHAINS: 2.51 mg/dL (ref 0.60–2.70)
TOTAL PROTEIN: 7.4 g/dL

## 2023-09-18 LAB — IMMUNOGLOBULIN M (IGM), SERUM: IMMUNOGLOBULIN M (IGM): 28 mg/dL — ABNORMAL LOW (ref 35–242)

## 2023-09-18 LAB — KAPPA AND LAMBDA FREE LIGHT CHAINS, SERUM
KAPPA FREE LIGHT CHAINS: 14.93 mg/dL — ABNORMAL HIGH (ref 1.25–3.25)
KAPPA/LAMBDA FLC RATIO: 5.95 — ABNORMAL HIGH (ref 0.80–2.10)
LAMBDA FREE LIGHT CHAINS: 2.51 mg/dL (ref 0.60–2.70)

## 2023-09-18 NOTE — Progress Notes (Signed)
Navigation: Introduced myself to patient. Gave and reviewed Pet scan instructions. Emphasized the importance of following instructions. Reviewed with patient to have nothing by mouth including, Bisma Klett, cigarettes, candy, cough drops, something to drink or eat 4 hours prior to the test. I let him know if he does they will send him home and reschedule his appt. Also, reviewed that nobody can come back with patient, and to not bring children with them for appt.

## 2023-09-23 ENCOUNTER — Other Ambulatory Visit: Payer: Self-pay

## 2023-09-23 ENCOUNTER — Encounter (HOSPITAL_BASED_OUTPATIENT_CLINIC_OR_DEPARTMENT_OTHER): Payer: Self-pay | Admitting: Emergency Medicine

## 2023-09-23 ENCOUNTER — Other Ambulatory Visit (HOSPITAL_BASED_OUTPATIENT_CLINIC_OR_DEPARTMENT_OTHER): Payer: Self-pay | Admitting: Emergency Medicine

## 2023-09-23 ENCOUNTER — Encounter (HOSPITAL_COMMUNITY): Payer: Self-pay | Admitting: Emergency Medicine

## 2023-09-23 ENCOUNTER — Inpatient Hospital Stay
Admission: RE | Admit: 2023-09-23 | Discharge: 2023-09-23 | Disposition: A | Discharge status: Home / Self Care | Payer: Medicare PPO | Source: Ambulatory Visit | Attending: Emergency Medicine | Admitting: Emergency Medicine

## 2023-09-23 ENCOUNTER — Other Ambulatory Visit (HOSPITAL_COMMUNITY): Payer: Self-pay | Admitting: Emergency Medicine

## 2023-09-23 ENCOUNTER — Encounter (HOSPITAL_COMMUNITY)
Admission: RE | Disposition: A | Discharge status: Home / Self Care | Payer: Self-pay | Source: Ambulatory Visit | Attending: Emergency Medicine

## 2023-09-23 DIAGNOSIS — M5416 Radiculopathy, lumbar region: Secondary | ICD-10-CM | POA: Insufficient documentation

## 2023-09-23 DIAGNOSIS — R52 Pain, unspecified: Secondary | ICD-10-CM

## 2023-09-23 DIAGNOSIS — M545 Low back pain, unspecified: Secondary | ICD-10-CM | POA: Insufficient documentation

## 2023-09-23 SURGERY — PAIN SERVICE BLOCK INJECTION CAUDAL EPIDURAL WITH IMAGING
Anesthesia: Local (Nurse-Monitored)

## 2023-09-23 MED ORDER — LIDOCAINE 1 %-EPINEPHRINE 1:100,000 INJECTION SOLUTION
Freq: Once | INTRAMUSCULAR | Status: DC | PRN
Start: 2023-09-23 — End: 2023-09-23
  Administered 2023-09-23: 30 mL via INTRAMUSCULAR

## 2023-09-23 MED ORDER — METHYLPREDNISOLONE ACETATE 40 MG/ML SUSPENSION FOR INJECTION
Freq: Once | INTRAMUSCULAR | Status: DC | PRN
Start: 2023-09-23 — End: 2023-09-23
  Administered 2023-09-23: 80 mg via INTRAMUSCULAR

## 2023-09-23 NOTE — OR PostOp (Signed)
80% relief reported by patient post procedure.  Pollyann Samples, RN  09/23/2023, 10:08

## 2023-09-23 NOTE — Discharge Instructions (Signed)
[place] [facility]  AT [facility] [place], [place] [address] - Phone: [phone])      Complications (Reasons to call):  Temperature greater than 101 degrees  Unusual redness or swelling at the injection site  Persistent nausea or vomiting or headache  Persistent weakness, numbness or bleeding  Loss of bowel or bladder control  If you are unable to reach your physician and your symptoms are severe, have yourself brought to the nearest Emergency Department or call 911    You may experience:  You may also experience a temporary increase in the level of your pain  You may experience weakness, tingling or heavy feelings in your legs the first few hours after your procedure, requiring you to be cautious when ambulating (walking).   Do NOT drive a car or operate heavy machinery for 24 hours after your procedure    Medications:  Most medications held for your procedure may be resumed one day after the procedure. Please ask your physician if you have questions about specific medications or the procedure itself.    Comfort measures:  You may use ice over the injection site  AVOID HEAT for the first 24 hours  After that ice or heat may be used as needed. DO NOT apply LONGER than 20 minutes and wait 20 minutes before reapplication  Avoid sitting in a bathtub, hot tub, pool, etc. for 3 days after your procedure    Activity:  Rest at home for the next 24 hours  Then increase activity as tolerated  Typically it is ok to return to work one day after the procedure

## 2023-09-23 NOTE — OR Surgeon (Signed)
[facility]  [address]  [place] [address]  [phone]        PATIENT NAME: Joanna Black   CHART LKGMWN:[id number]  DATE OF BIRTH: 1946/09/30  DATE OF SERVICE:   09/23/2023    PREOPERATIVE DIAGNOSIS:   Patient Active Problem List   Diagnosis    Type 2 diabetes mellitus with stage 3a chronic kidney disease, without long-term current use of insulin (CMS HCC)    Cervical pain (neck)    Thoracic back pain    Chronic bilateral low back pain    Essential hypertension    Cardiomyopathy, dilated, nonischemic (CMS HCC)    Chronic GERD    Chronic systolic congestive heart failure (CMS HCC)    BMI 35.0-35.9,adult    Mixed hyperlipidemia    CKD (chronic kidney disease) stage 3, 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/min (CMS HCC)    History of colon polyps    Colon cancer screening    Gastroesophageal reflux disease    Dysphagia    Schatzki's ring    Chronic diarrhea    Diverticulosis of colon    Hypoxia    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)    Enteropathogenic Escherichia coli infection    Encounter for support and coordination of transition of care    Chronic anemia    Osteoporosis     Lumbar radiculopathy    POSTOPERATIVE DIAGNOSIS: SAME    Procedure:   1) Caudal epidural steroid injection with catheter   2) Fluoroscopic needle guidance      PHYSICIAN:  Forbes Cellar, DO    MEDICATIONS INJECTED: 3 mL of 1%Lidocaine,  80mg DepoMedrol(methylprednisolone)  and 3 mL of sterile, preservative-free normal saline    LOCAL ANESTHETIC INJECTED: 4 mL of 1% lidocaine    SEDATION MEDICATIONS: None    ESTIMATED BLOOD LOSS: None    COMPLICATIONS: None    TECHNIQUE: A time-out was taken to identify the correct patient and procedure side prior to starting the procedure. \  TIMEOUT:  Correct patient? Yes  Correct site? Yes  Correct side? Yes  Correct position? Yes  Correct procedure? Yes  Correct medication? Yes  Site marked? Yes  H&P note completed? Yes  Consents verified? Yes  Relevant lab results available?  Yes  Allergies reviewed? Yes  Is all required equipment for the procedure available? Yes  Is documentation verified? Yes  Is Time Out verified by doctor and nurse? Yes  Forbes Cellar, DO    Lying in the prone position, the patient was prepped and draped in sterile fashion using DuraPrep and a fenestrated drape. Appropriate anatomic landmarks were determined using a lateral fluoroscopic image. Local anesthetic was given by raising a wheal and going down to the hub of a 27-gauge 1.25-inch needle. A 17 gauge -gauge, 3.5inch Tuohy needle was introduced through the sacral hiatus. The needle was advanced caudal to the inferior sacroiliac joint line. A  21 -gauge flexible epidural catheter was then advanced through the needle to the L4-5 interspace. Omnipaque 240 was injected to confirm placement in the appropriate epidural space, and to show that there was no vascular runoff. The medication was then injected slowly. The catheter was then removed with the tip intact.     The procedure was completed without complications and was tolerated well. The patient was monitored after the procedure. The patient (or responsible party) was given post-procedure and discharge instructions to follow at home. The patient was discharged in stable condition. A follow-up plan was made.  Forbes Cellar, DO 09/23/2023 09:38

## 2023-09-23 NOTE — OR PostOp (Signed)
DVPRS OVERALL PAIN RATING POST PROCEDURE  0/10

## 2023-09-23 NOTE — Nursing Note (Signed)
[place] Pain Rating Scale     On a scale of 0-10, during the past 24 hours, pain has interfered with you usual activity:       On a scale of 0-10, during the past 24 hours, pain has interfered with your sleep:      On a scale of 0-10, during the past 24 hours, pain has affected your mood:       On a scale of 0-10, during the past 24 hours, pain has contributed to your stress:       On a scale of 0-10, what is your overall pain Rating:

## 2023-09-23 NOTE — OR Nursing (Signed)
Medications provided to physician on sterile field.   See MD note for exact amount administered.

## 2023-09-24 ENCOUNTER — Telehealth (INDEPENDENT_AMBULATORY_CARE_PROVIDER_SITE_OTHER): Payer: Self-pay | Admitting: Emergency Medicine

## 2023-09-24 NOTE — Telephone Encounter (Signed)
Called patient regarding CAUDAL 09/23/23 . No answer, left message.       Joanna Black

## 2023-09-25 ENCOUNTER — Ambulatory Visit
Admission: RE | Admit: 2023-09-25 | Discharge: 2023-09-25 | Disposition: A | Discharge status: Home / Self Care | Payer: Medicare PPO | Source: Ambulatory Visit | Attending: Family Medicine | Admitting: Family Medicine

## 2023-09-25 ENCOUNTER — Other Ambulatory Visit (HOSPITAL_COMMUNITY): Payer: Medicare PPO

## 2023-09-25 ENCOUNTER — Other Ambulatory Visit: Payer: Self-pay

## 2023-09-25 DIAGNOSIS — M810 Age-related osteoporosis without current pathological fracture: Secondary | ICD-10-CM | POA: Insufficient documentation

## 2023-09-25 LAB — PHOSPHORUS: PHOSPHORUS: 4 mg/dL (ref 2.3–4.0)

## 2023-09-25 LAB — MAGNESIUM: MAGNESIUM: 2.1 mg/dL (ref 1.8–2.6)

## 2023-09-25 LAB — CALCIUM: CALCIUM: 9.6 mg/dL (ref 8.6–10.3)

## 2023-09-25 NOTE — Nurses Notes (Signed)
In reviewing consent for Prolia patient stated that she had injection to her back here at [facility] in OR 2 days ago. Patient did not received injection today due to having steroid injection to back on Monday, Dr. Sallee Lange messaged and it was decided that patient should reschedule. Patient with understanding. Rescheduled and patient will return. Labs WNL. Will not need to repeat. Dereck Ligas

## 2023-10-03 ENCOUNTER — Ambulatory Visit: Payer: Medicare PPO

## 2023-10-03 ENCOUNTER — Telehealth (HOSPITAL_COMMUNITY): Payer: Self-pay | Admitting: Family Medicine

## 2023-10-03 NOTE — Telephone Encounter (Signed)
The patient was contacted  about her missed appointment. She stated that she is currently receiving corticosteroid injections and wishes to hold off on her Prolia injections at this time. She will contact OPN when the treatment is to be restarted.

## 2023-10-06 ENCOUNTER — Other Ambulatory Visit (INDEPENDENT_AMBULATORY_CARE_PROVIDER_SITE_OTHER): Payer: Self-pay | Admitting: Family Medicine

## 2023-10-07 ENCOUNTER — Ambulatory Visit: Payer: Medicare PPO | Attending: Physician Assistant | Admitting: Physician Assistant

## 2023-10-07 ENCOUNTER — Other Ambulatory Visit: Payer: Self-pay

## 2023-10-07 ENCOUNTER — Encounter (HOSPITAL_COMMUNITY): Payer: Self-pay | Admitting: Physician Assistant

## 2023-10-07 VITALS — BP 144/82 | HR 83 | Ht 64.0 in | Wt 211.0 lb

## 2023-10-07 DIAGNOSIS — Z8249 Family history of ischemic heart disease and other diseases of the circulatory system: Secondary | ICD-10-CM | POA: Insufficient documentation

## 2023-10-07 DIAGNOSIS — N184 Chronic kidney disease, stage 4 (severe): Secondary | ICD-10-CM | POA: Insufficient documentation

## 2023-10-07 DIAGNOSIS — I779 Disorder of arteries and arterioles, unspecified: Secondary | ICD-10-CM | POA: Insufficient documentation

## 2023-10-07 DIAGNOSIS — Z7982 Long term (current) use of aspirin: Secondary | ICD-10-CM | POA: Insufficient documentation

## 2023-10-07 DIAGNOSIS — I4510 Unspecified right bundle-branch block: Secondary | ICD-10-CM | POA: Insufficient documentation

## 2023-10-07 DIAGNOSIS — I517 Cardiomegaly: Secondary | ICD-10-CM | POA: Insufficient documentation

## 2023-10-07 DIAGNOSIS — I10 Essential (primary) hypertension: Secondary | ICD-10-CM | POA: Insufficient documentation

## 2023-10-07 DIAGNOSIS — E785 Hyperlipidemia, unspecified: Secondary | ICD-10-CM | POA: Insufficient documentation

## 2023-10-07 DIAGNOSIS — I130 Hypertensive heart and chronic kidney disease with heart failure and stage 1 through stage 4 chronic kidney disease, or unspecified chronic kidney disease: Secondary | ICD-10-CM | POA: Insufficient documentation

## 2023-10-07 DIAGNOSIS — I5030 Unspecified diastolic (congestive) heart failure: Secondary | ICD-10-CM | POA: Insufficient documentation

## 2023-10-07 DIAGNOSIS — Z79899 Other long term (current) drug therapy: Secondary | ICD-10-CM | POA: Insufficient documentation

## 2023-10-07 DIAGNOSIS — I5032 Chronic diastolic (congestive) heart failure: Secondary | ICD-10-CM | POA: Insufficient documentation

## 2023-10-07 DIAGNOSIS — Z789 Other specified health status: Secondary | ICD-10-CM | POA: Insufficient documentation

## 2023-10-07 DIAGNOSIS — I6509 Occlusion and stenosis of unspecified vertebral artery: Secondary | ICD-10-CM | POA: Insufficient documentation

## 2023-10-07 NOTE — Progress Notes (Signed)
[facility]  [address]  [place], [place] [address]  Phone:  [phone]  Fax:  [phone]    CARDIOLOGY FOLLOW-UP    Name: Joanna Black                       Date of Birth: 12-13-1946   MRN:  [id number]                         Date of visit: 10/07/2023     PCP: Genene Churn, DO     Chief Complaint    Follow Up 6 Months       Subjective  Joanna HARBERTS is a 77 y.o. year old female who presents for Follow Up 6 Months (heart failure with preserved ejection fraction,)   to clinic.  She has a past medical history of GERD, anxiety/depression, hypertension, chronic kidney disease, HFpEF, hyperlipidemia with statin intolerance, and diabetes.  She is also with past medical history of lung disease, polymyalgia rheumatica, and plasma cell dyscracia.  She has followed with vascular surgery, Dr. 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  She denies history of known coronary artery disease.  No history of cardiac stents or bypass.  No history of heart attack.  No history of stroke.  No history of rheumatic fever or scarlet fever.  No history of liver disease.  In regards to her social history, no tobacco use, alcohol use, or illicit drug use.  In regards to her family history she has 2 brothers both with history of coronary artery disease.  One brother has history of heart attack and another brother with history of cardiac stents.  She does have drug allergies to multiple statins causing myalgias.  She has tried multiple statins in the past and cannot tolerate these.  At 1 point she was on pravastatin in September 2015.  Otherwise, she cannot recall the other means of the statins that she has tried.  She also has drug allergies to Augmentin causing nausea and vomiting and amoxicillin.      She notes that she is a history of CHF.  Approximately 6-7 years ago she was in [place].  She notes they would stay there in the winters.  She began having shortness of breath and her oxygen saturation was  low.  She was also with a lot of fluid on board.  She notes she was hospitalized for about 3 days and was treated with medications for breathing improvement and fluid removal.      Patient's most recent cardiac testing includes echocardiogram 10/31/2022.  This noted preserved ejection fraction of 50-55% with mild MR and mild TR.  Patient does have history of stress test performed 01/02/2022.  At that time, she had no stress-induced ischemia.  Ejection fraction calculated at 60 percent. Most recent carotid artery ultrasound performed 12/25/2021.  At that time she had less than 50 percent bilateral carotid artery disease.  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  CTA of the head and neck was ordered.  She notes that she did try to get this done on 2 separate occasions, but her kidney function was too poor for them to do this study.  Ultrasound upper extremities 04/23/2023 noted no hemodynamic significant arterial disease in bilateral upper extremities at rest.    Patient was followed with vascular surgery, Dr. Doneen Poisson, concerns for giant cell arteritis.  Upper extremity ultrasounds  04/23/2023 performed as above, no significant obstructive hemodynamic significant arterial stenosis bilateral upper extremities.  Patient has been on prednisone taper in the past.  CTA head and neck was recommended, however, I believe this could not be performed due to renal status.  Per patient's medication list, she remains on prednisone at this time.    Patient presents today for routine 6 month cardiac follow-up.  From a cardiac standpoint patient doing relatively well.  No complaints of chest pain or fluid retention.  She has been struggling with more frequent headaches and trouble with focusing her vision.  She attributes this to giant cell arteritis.  I will send a staff message over to her vascular surgery team in regards to this.  She offers no further cardiac complaints or concerns to  myself today.  Recent lab results as below. Further ROS as below.    Recent Lab Results:  Last CBC  (Last result in the past 2 years)        WBC   HGB   HCT   MCV   Platelets      08/18/23 0916 9.4   10.9   33.8   89.8   424            Last BMP  (Last result in the past 2 years)        Na   K   Cl   CO2   BUN   Cr   Calcium   Glucose   Glucose-Fasting        09/25/23 1252             9.6  Comment: Gadolinium-containing contrast can interfere with calcium measurement.                   Last Hepatic Panel  (Last result in the past 2 years)        Albumin   Total PTN   Total Bili   Direct Bili   Ast/SGOT   Alt/SGPT   Alk Phos        09/17/23 1546 3.3                   09/17/23 1546   7.4                 09/17/23 1546 3.3                         Lab Results   Component Value Date    TSH 3.095 08/18/2023     Lab Results   Component Value Date    TRIG 160 (H) 08/18/2023    HDLCHOL 43 (L) 08/18/2023    LDLCHOL 88 08/18/2023    CHOLESTEROL 159 08/18/2023     HEMOGLOBIN A1C   Date Value Ref Range Status   08/18/2023 7.1 (H) 4.8 - 6.2 % Final   01/23/2023 6.4 (H) 4.8 - 6.2 % Final   12/10/2022 7.6  Final   03/13/2022 5.9  Final   11/29/2021 6.3 (H) 4.8 - 6.2 % Final     09/25/2023:   Phosphorus level 4.0.    Magnesium level 2.1.    09/17/2023:   TIBC 329.    Iron saturation 7.    Iron level 22.    Transferrin 235.     Patient Active Problem List    Diagnosis Date Noted    Osteoporosis 08/14/2023    Chronic anemia 05/01/2023    Encounter for  support and coordination of transition of care 11/16/2022     Hospitalization from November 7th through November 01, 2022.       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    Chronic systolic congestive heart failure (CMS HCC) 03/21/2016    Chronic GERD 02/20/2016      Past Medical History:   Diagnosis Date    Arthritis     Asthma     Back problem     BMI 35.0-35.9,adult 02/16/2020    CAD (coronary artery disease)     mild    Cataract     Cataracts, bilateral     CHF (congestive heart failure) (CMS HCC)     Chronic bronchitis with emphysema (CMS HCC)     Chronic low back pain     Claustrophobia     Depression     Diabetes (CMS HCC)     Diabetes mellitus, type 2 (CMS HCC)     Encounter for support and coordination of transition of care 11/16/2022    Esophageal reflux     Essential hypertension 01/28/2019    H/O urinary tract infection     HH (hiatus hernia)     History of kidney disease     states, "low kidney functions."    Hypercholesterolemia     Hypertension     Hypertriglyceridemia     Type 2 diabetes mellitus (CMS HCC)      Allergies   Allergen Reactions    Amoxicillin  Other Adverse Reaction (Add comment)     Unknown reaction    Augmentin [Amoxicillin-Pot Clavulanate] Nausea/ Vomiting    Statins-Hmg-Coa Reductase Inhibitors Myalgia     MEDICATIONS  Outpatient Medications Prior to Visit   Medication Sig Dispense Refill    acetaminophen (TYLENOL) 500 mg Oral Tablet Take 1 Tablet (500 mg total) by mouth Every night as needed Takes 2 at night      albuterol sulfate (PROVENTIL OR VENTOLIN OR  PROAIR) 90 mcg/actuation Inhalation oral inhaler Take 1-2 Puffs by inhalation Every 6 hours as needed 1 Each 3    albuterol sulfate (PROVENTIL) 2.5 mg /3 mL (0.083 %) Inhalation nebulizer solution INHALE 1 VIAL BY MOUTH VIA NEBULIZER EVERY 4 HOURS AS NEEDED FOR WHEEZING 225 mL 3    aspirin (ECOTRIN) 81 mg Oral Tablet, Delayed Release (E.C.) Take 1 Tablet (81 mg total) by mouth Once a day      calcium carbonate/vitamin D3 (VITAMIN D-3 ORAL) Take 2,000 Int'l Units/day  by mouth Once a day      conjugated estrogens (PREMARIN) 0.625 mg/gram Vaginal Cream Insert 0.5 g into the vagina Every night 30 g 5    ezetimibe (ZETIA) 10 mg Oral Tablet Take 1 Tablet (10 mg total) by mouth Every evening 90 Tablet 1    ferrous sulfate (FEOSOL) 325 mg (65 mg iron) Oral Tablet TAKE 1 TABLET (325 MG TOTAL) BY MOUTH ONCE A DAY FOR 3 MONTHS (Patient not taking: Reported on 10/07/2023) 90 Tablet 0    FLUoxetine (PROZAC) 20 mg Oral Capsule Take 1 Capsule (20 mg total) by mouth Once a day 90 Capsule 1    furosemide (LASIX) 20 mg Oral Tablet TAKE 1 TABLET (20 MG TOTAL) BY MOUTH TWICE PER DAY AS NEEDED 180 Tablet 1    magnesium oxide (MAG-OX) 400 mg Oral Tablet Take 1 Tablet (400 mg total) by mouth Three times a day (Patient not taking: Reported on 10/07/2023) 30 Tablet 0    metoprolol succinate (TOPROL-XL) 25 mg Oral Tablet Sustained Release 24 hr Take 1 Tablet (25 mg total) by mouth Every night 90 Tablet 1    omeprazole (PRILOSEC) 20 mg Oral Capsule, Delayed Release(E.C.) Take 1 Capsule (20 mg total) by mouth Once a day 90 Capsule 1    predniSONE (DELTASONE) 5 mg Oral Tablet Take one 5 mg with your 1 mg prednisone tabs which you have at home for a total daily dose of 6 mg daily. 30 Tablet 0    sarilumab (KEVZARA) 200 mg/1.14 mL Subcutaneous Pen Injector Inject 1 pen (200 mg) subcutaneously (under the skin) once every 14 days (Patient not taking: Reported on 10/07/2023) 2.28 mL 11    tiZANidine (ZANAFLEX) 4 mg Oral Tablet TAKE 1 TABLET BY MOUTH  THREE TIMES A DAY AS NEEDED FOR MUSCLE CRAMPS STRENGTH: 4 MG 270 Tablet 1    trimethoprim (TRIMPEX) 100 mg Oral Tablet Take 1 Tablet (100 mg total) by mouth Once a day 90 Tablet 3    TRULICITY 0.75 mg/0.5 mL Subcutaneous Pen Injector INJECT 0.5 ML SUBCUTANEOUSLY  EVERY 7 DAYS 6 mL 3     No facility-administered medications prior to visit.     REVIEW OF SYSTEMS:   General: No fever or chills.  HEENT: No vision changes or issues with bleeding from ears, nose, or throat.    Cardiac: No reproducible chest pain, tightness, heaviness, or discomfort with exertion. No palpitations.   Resp: No dyspnea at rest or with exertion. No cough, hemoptysis, or paroxysmal nocturnal dyspnea/orthopnea.   GI: No N/V/D, melena, or bright red blood per rectum.  Ext: No lower extremity edema or claudication.    Neuro: No lightheadedness, dizziness, presyncope, syncope, focal weakness, numbness, or tingling.  All other ROS negative    Objective:   BP (!) 144/82   Pulse 83   Ht 1.626 m (5\' 4" )   Wt 95.7 kg (211 lb)   SpO2 96%   BMI 36.22 kg/m        Physical Exam  Vitals and nursing note reviewed.   Constitutional:       General: She is not in acute distress.     Appearance: Normal appearance. She is not ill-appearing.      Comments: Sitting in bedside chair.   HENT:      Head: Atraumatic.   Eyes:      Conjunctiva/sclera: Conjunctivae normal.   Neck:      Vascular: No carotid bruit.   Cardiovascular:      Rate  and Rhythm: Normal rate and regular rhythm.      Heart sounds: Normal heart sounds. No murmur heard.     No friction rub. No gallop.   Pulmonary:      Effort: Pulmonary effort is normal.      Breath sounds: Normal breath sounds. No wheezing, rhonchi or rales.   Musculoskeletal:      Right lower leg: No edema.      Left lower leg: No edema.   Skin:     General: Skin is warm and dry.      Findings: No bruising.   Neurological:      General: No focal deficit present.      Mental Status: She is alert and oriented to person, place, and  time.   Psychiatric:         Mood and Affect: Mood normal.       EKG today noting NSR at 62bpm with no acute ST/T wave changes. Incomplete RBBB, which is a new finding on today's EKG. Left axis deviation and changes consistent with LVH with repolarization abnormality present, and have been present on prior EKGs. Patient with T wave inversions present leads II, II, aVF, as well a s leads V4-V6 with biphasic T wave in V3. These T wave changes have been present on prior EKGs. When today's EKG is compared to prior EKG, I see no significant changes, other than new incomplete RBBB.     Assessment/Plan  Assessment/Plan   1. Heart failure with preserved ejection fraction, unspecified HF chronicity (CMS HCC)    2. Hypertension, unspecified type    3. Hyperlipidemia, unspecified hyperlipidemia type    4. Statin intolerance    5. Vertebral artery disease (CMS HCC)    6. Incomplete RBBB    7. LVH (left ventricular hypertrophy)    8. CKD (chronic kidney disease) stage 4, GFR 15-29 ml/min (CMS HCC)      Orders Placed This Encounter    EKG (93000) SAME DAY IN CLINIC    TRANSTHORACIC ECHOCARDIOGRAM - ADULT    CAROTID ARTERY DUPLEX      1. HFpEF.  First diagnosed approximately 6-7 years ago at outside facility in [place], of which she had to receive what sounds like IV diuretic therapy. Echocardiogram 10/31/2022 noted preserved ejection fraction of 50-55% with mild MR and mild TR.     - She currently denies excessive fluid retention or dyspnea. No complaints of PND/PNO. She does not appear overtly fluid overloaded on exam. Lung fields clear to auscultation b/l. I do not appreciate any excessive LE swelling.   - Current GDMT with beta blockade/Toprol XL 25 mg qDay.   - Previously on Arb therapy with irbesartan.  However, this was discontinued during hospitalization in November 2023 due to hypotension. Patient also with stage IV CKD.   - Recommend daily weights.   - We will update echocardiogram for re-evaluation of EF, wall motion, and  valvular status prior to next appointment.     2. Hypertension.  Intake BP 144/82.    - continue Toprol XL 25 mg qDay.   - Continue low fat low salt diet.     3. Hyperlipidemia; Statin intolerance.  Statin intolerance. She notes that she has tried multiple statins all which cause myalgias. She is unable to recall exact names. Documented pravastatin 08/2014.   - Currently on Zetia 10 mg qDay.    - She is with diabetes. Recommend LDL < 70, HDL > 45, and triglycerides < 150.   -  Chart review notes that she has been offered alternative statin therapy, and has deferred in the past.   - Could consider PCSK9 therapy with Repatha or Praluent in the future if patient willing.   - Continue low fat low salt diet.    4. Vertebral artery stenosis.  Patient did have carotid artery ultrasound performed 12/25/2021.  At that time she had less than 50 percent bilateral carotid artery disease.  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  CTA of the head and neck was ordered.  She notes that she did try to get this done on 2 separate occasions, but her kidney function was too poor for them to do this study.    - Will plan to repeat CAUS at patient's convenience.   - ASA 81 mg qDay.   - Statin intolerance, currently on Zetia 10 mg qDay.    5. Incomplete RBBB; LVH.   EKG today noting NSR at 62bpm with no acute ST/T wave changes. Incomplete RBBB, which is a new finding on today's EKG. Left axis deviation and changes consistent with LVH with repolarization abnormality present, and have been present on prior EKGs. Patient with T wave inversions present leads II, II, aVF, as well a s leads V4-V6 with biphasic T wave in V3. These T wave changes have been present on prior EKGs. When today's EKG is compared to prior EKG, I see no significant changes, other than new incomplete RBBB.    - No complaints of chest pain with exertion.  No active chest pain.  No syncope complaints.     - We will update  echocardiogram for re-evaluation of EF, wall motion, and valvular status.     - Recommend routine EKGs for monitoring.    PLAN  Would continue to work on aggressive risk factor modification.  Patient was encouraged to call with any further questions/concerns.  Patient is to always seek immediate medical attention if feeling too poorly for any reason.  Will continue current cardiac treatment at this time. No medication changes today.  Plan for repeat CAUS and echocardiogram at patient's convenience.  Will notify her of CAUS results via phone. If results warrant sooner follow-up, additional testing recommendations, etc., we can make adjustments accordingly.  We will message over to vascular surgery to let him know of patient has increasing headache frequency.  Otherwise, patient to follow-up with Cardiology in 8 months, or always sooner if needed.      Return in about 8 months (around 06/06/2024).       NOTE:  Please note that this Assessment/Plan is Cardiology/Heart & Vascular focused.  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      NOTE:  This patient was seen independently in clinic by the APP.     NOTE:  A portion of this documentation was generated using MMODAL voice recognition software and may contain syntax/voice recognition errors. Any errors are not intentional. Please call with any questions/concerns.    Fuller Mandril, PA-C  [facility]

## 2023-10-07 NOTE — Progress Notes (Signed)
PAIN MANAGEMENT, [facility] OFFICE BUILDING  [address]  [place] [address]  Operated by [facility]    Name: Joanna Black MRN:  [id number]   Date: 10/13/2023 DOB:  July 17, 1946 (76 y.o.)         CC: Follow Up After Injection     Obtained history from: patient    SUBJECTIVE:  Joanna Black is a 77 y.o. female  who RETURNS to [facility] for follow up evaluation.  Last seen 09/23/23 for Caudal ESI with catheter per Dr. Allyson Sabal.  She reports no relief following procedure.  Currently rates pain 7/10, located across low back.  Describes as aching, sharp, hot, worse with walking, lifting, bending, standing, and sitting.  Pain improves with ice, rest, and lying.  Denies radicular symptoms. States follows with rheumatology provider for treatment of polymyalgia rheumatica and giant cell arteritis.  Has upcoming pulmonology appointment for evaluation of SOB.  PET scan later this week per oncology provider following abnormal blood work.   As previously documented, patient has participated in conservative treatment in the past such as physical therapy and medication management without adequate relief.  Continues to have moderate to severe pain which does interfere with daily activities and sleep.    Procedures:  09/23/23 Caudal ESI with catheter- No relief    Patient's past history and current information (including medical, surgical, family, social, allergy, medication) were reviewed personally today.       REVIEW OF SYSTEMS:  Other than ROS in the HPI, all other review of systems were negative except for: bladder leakage (ongoing) and visual changes.  Denies new numbness/weakness, bowel incontinence, saddle anesthesia.    PHYSICAL EXAMINATION:    Vitals: BP (!) 142/60   Pulse 69   Temp (!) 35.9 C (96.6 F)   Ht 1.626 m (5\' 4" )   Wt 97 kg (213 lb 13.5 oz)   SpO2 98%   BMI 36.71 kg/m       GENERAL EXAMINATION:  resting in seated position. No acute distress.   EYES: conjunctiva  clear  SKIN:  warm and dry  HEAD/EARS/NOSE/THROAT:   normocephalic, atraumatic  NECK:   symmetric, trachea midline  CHEST WALL AND LUNGS:   Unlabored breathing, symmetrical chest excursions.  PSYCHOLOGICAL:  Affect normal. Behavior appropriate.  NEUROLOGICAL/MUSCULOSKELETAL:  Alert and oriented x3,  memory grossly intact  Visual Inspection: No appreciable muscular atrophy  Motor is 5/5 across all joints of the BLE  Sensation: normal  Coordination: Normal  Gait:Nonantalgic with use of no assistive device  Palpation: TTP: lumbar paraspinals and right gluteal region  lumbar facet loading  Positive  Compression testing: mildly positive over right SIJ    RADIOLOGICAL/ LAB FINDINGS:    Lab A1C Results:  HEMOGLOBIN A1C   Date Value Ref Range Status   08/18/2023 7.1 (H) 4.8 - 6.2 % Final   01/23/2023 6.4 (H) 4.8 - 6.2 % Final   12/10/2022 7.6  Final   03/13/2022 5.9  Final   11/29/2021 6.3 (H) 4.8 - 6.2 % Final       POCT A1C Results:      03/13/2022    11:00 AM 10/03/2022     4:00 PM 12/10/2022     9:00 AM   POCT A1c   Time Performed   09:27   A1C 5.9 6.3 7.6       Lab Results   Component Value Date    POCTHA1C 6.3 (A) 10/03/2022  MRI SPINE LUMBOSACRAL WO CONTRAST     Collection Time: 05/21/23  1:53 PM     Narrative     Joanna Black     PROCEDURE DESCRIPTION: MRI SPINE LUMBOSACRAL WO CONTRAST     CLINICAL INDICATION: R26.2: Ambulatory dysfunction     COMPARISON: No prior studies were compared.        FINDINGS: Multiplanar multisequence images lumbar spine obtained without IV contrast enhancement. There is grade 1 anterolisthesis L4 on L5. There is no bone edema. There is disc desiccation at multiple levels throughout the lumbar spine. Mild disc space narrowing is seen. Spinal cord is normal with conus terminating at L1.     There is mild disc bulging L1-2 but no significant spinal stenosis.     Mild disc bulging and degenerative facet arthropathy L2-3 without significant spinal stenosis.     Degenerative facet  changes and mild disc bulging L3-4 with mild central canal stenosis and foraminal narrowing.     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     Degenerative facet changes L5-S1 with disc bulging more prominent towards the left side. There is a central disc protrusion or herniation superimposed. There is moderate left foraminal narrowing. There is mild central canal stenosis.     Paraspinal soft tissues appear normal.     No findings to suggest infectious or inflammatory process of the lumbar spine.        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                 Radiologist location ID: [id number]         ASSESSMENT:     Low back pain     Lumbar disc disease     Lumbar facet arthropathy     Lumbar spondylosis     Lumbar spinal stenosis        PLAN:   Bilateral L4-5, L5-S1 MBBs. Two sets, 2 weeks apart at [facility] per Dr. Allyson Sabal. Schedule after 11/07/23.  If MBBs confirmatory, Left L4-5, L5-S1 RFA.  Right side 2 weeks later.     Of note, upcoming PET scan per Dr. Ninfa Linden, [facility], following Plasma cell dyscrasia diagnosis.     Blood thinner: ASA 81 mg.       Consider:  Right SIJ injection      Our impression, treatment recommendations and plan from today's visit were reviewed in detail with the patient in the office. All of the patient's questions were answered. The procedure was explained using a spine model,  including technique, benefits, alternatives and the associated risks including but not limited to pain at the injection site, infection, bleeding, nerve damage, reactions to medications administered, and failure of the pain to improve.  The patient verbalized understanding and wishes to move forward with the above noted plan.    Follow  up:  Return 4 weeks after ablations.    Patient was seen independently.    Judi Cong, APRN,FNP-BC

## 2023-10-07 NOTE — Patient Instructions (Addendum)
1.  Continue medications as prescribed.  2.  Will plan for updated echocardiogram (ultrasound of heart) and carotid artery ultrasound to be done at your convenience. This can be done at [facility]. You will be called to schedule this testing.  3.  Will notify you of testing results via phone. If you do not hear from our office within 5-7 days of completion of carotid artery ultrasound, please contact us for results. Should results warrant sooner follow-up, additional testing recommendations, etc., we can make adjustments accordingly.  4.  Otherwise, follow-up with Cardiology in 8 months, or always sooner if needed.   5.  Please do not hesitate to call with any further questions/concerns.  6.  Always seek immediate medical attention if feeling too poorly for any reason.

## 2023-10-09 ENCOUNTER — Telehealth (HOSPITAL_COMMUNITY): Payer: Self-pay | Admitting: Physician Assistant

## 2023-10-09 ENCOUNTER — Other Ambulatory Visit (HOSPITAL_COMMUNITY): Payer: Self-pay | Admitting: Nurse Practitioner

## 2023-10-09 ENCOUNTER — Other Ambulatory Visit: Payer: Self-pay

## 2023-10-09 ENCOUNTER — Inpatient Hospital Stay
Admission: RE | Admit: 2023-10-09 | Discharge: 2023-10-09 | Disposition: A | Discharge status: Home / Self Care | Payer: Medicare PPO | Source: Ambulatory Visit | Attending: Internal Medicine | Admitting: Internal Medicine

## 2023-10-09 DIAGNOSIS — E8809 Other disorders of plasma-protein metabolism, not elsewhere classified: Secondary | ICD-10-CM

## 2023-10-09 DIAGNOSIS — D472 Monoclonal gammopathy: Secondary | ICD-10-CM

## 2023-10-09 LAB — POC BLOOD GLUCOSE (RESULTS): GLUCOSE, POC: 114 mg/dL — ABNORMAL HIGH (ref 70–110)

## 2023-10-09 MED ORDER — DIAZEPAM 5 MG TABLET
5.0000 mg | ORAL_TABLET | Freq: Once | ORAL | 0 refills | Status: DC | PRN
Start: 2023-10-09 — End: 2023-12-22

## 2023-10-09 NOTE — Telephone Encounter (Signed)
Spoke to pt's son, advised of appointment for echo and carotid artery duplex on 10/16/23 @5pm , he voiced understanding.Ephriam Jenkins, LPN

## 2023-10-13 ENCOUNTER — Encounter (INDEPENDENT_AMBULATORY_CARE_PROVIDER_SITE_OTHER): Payer: Self-pay | Admitting: Neurological Surgery

## 2023-10-13 ENCOUNTER — Other Ambulatory Visit: Payer: Self-pay

## 2023-10-13 ENCOUNTER — Ambulatory Visit: Payer: Medicare PPO | Attending: Neurological Surgery | Admitting: Neurological Surgery

## 2023-10-13 VITALS — BP 142/60 | HR 69 | Temp 96.6°F | Ht 64.0 in | Wt 213.8 lb

## 2023-10-13 DIAGNOSIS — M47816 Spondylosis without myelopathy or radiculopathy, lumbar region: Secondary | ICD-10-CM | POA: Insufficient documentation

## 2023-10-13 DIAGNOSIS — M545 Low back pain, unspecified: Secondary | ICD-10-CM | POA: Insufficient documentation

## 2023-10-13 NOTE — Nursing Note (Signed)
Pain and Function:     [place] Pain Rating Scale     On a scale of 0-10, during the past 24 hours, pain has interfered with you usual activity:   5    On a scale of 0-10, during the past 24 hours, pain has interfered with your sleep:  0    On a scale of 0-10, during the past 24 hours, pain has affected your mood:   3    On a scale of 0-10, during the past 24 hours, pain has contributed to your stress:   3    On a scale of 0-10, what is your overall pain Rating:  7           Pain Same  Last seen on 09/23/23 for procedure injection   0 % of relief for now   Lowest level of pain 3 /10  Highest level of pain 10 /10  Heather Roberts, [place]

## 2023-10-14 ENCOUNTER — Ambulatory Visit (INDEPENDENT_AMBULATORY_CARE_PROVIDER_SITE_OTHER): Payer: Self-pay | Admitting: Family Medicine

## 2023-10-16 ENCOUNTER — Ambulatory Visit (HOSPITAL_COMMUNITY): Payer: Self-pay

## 2023-10-16 ENCOUNTER — Inpatient Hospital Stay
Admission: RE | Admit: 2023-10-16 | Discharge: 2023-10-16 | Disposition: A | Discharge status: Home / Self Care | Payer: Medicare PPO | Source: Ambulatory Visit | Attending: Internal Medicine | Admitting: Internal Medicine

## 2023-10-16 ENCOUNTER — Other Ambulatory Visit: Payer: Self-pay

## 2023-10-16 ENCOUNTER — Ambulatory Visit (HOSPITAL_COMMUNITY): Payer: Self-pay | Admitting: Internal Medicine

## 2023-10-16 DIAGNOSIS — D472 Monoclonal gammopathy: Secondary | ICD-10-CM | POA: Insufficient documentation

## 2023-10-16 LAB — POC BLOOD GLUCOSE (RESULTS): GLUCOSE, POC: 128 mg/dL — ABNORMAL HIGH (ref 70–110)

## 2023-10-23 ENCOUNTER — Ambulatory Visit (INDEPENDENT_AMBULATORY_CARE_PROVIDER_SITE_OTHER): Payer: Self-pay | Admitting: Family Medicine

## 2023-10-23 ENCOUNTER — Other Ambulatory Visit: Payer: Self-pay

## 2023-10-23 ENCOUNTER — Ambulatory Visit
Payer: Medicare PPO | Attending: Student in an Organized Health Care Education/Training Program | Admitting: Internal Medicine

## 2023-10-23 DIAGNOSIS — D472 Monoclonal gammopathy: Secondary | ICD-10-CM

## 2023-10-23 DIAGNOSIS — Z712 Person consulting for explanation of examination or test findings: Secondary | ICD-10-CM

## 2023-10-23 DIAGNOSIS — E8809 Other disorders of plasma-protein metabolism, not elsewhere classified: Secondary | ICD-10-CM

## 2023-10-23 DIAGNOSIS — J069 Acute upper respiratory infection, unspecified: Secondary | ICD-10-CM

## 2023-10-23 MED ORDER — AZITHROMYCIN 250 MG TABLET
ORAL_TABLET | ORAL | 0 refills | Status: DC
Start: 2023-10-23 — End: 2023-10-28

## 2023-10-23 NOTE — Telephone Encounter (Signed)
Patient called and left a message asking if PCP could call in an antibiotic, she has the same thing her husband had last week.  Hal Morales, [place] 10/23/2023 10:17

## 2023-10-23 NOTE — Cancer Center Note (Signed)
TELEMEDICINE DOCUMENTATION:    Patient Location:  MyChart telephone visit from home address: [address] [place] [facility] [address]    Patient/family aware of provider location:  yes  Patient/family consent for telemedicine:  yes  Examination observed and performed by:   Patient was not examined       Encounter Date: 10/23/2023   4:00 PM EDT    Name:  Joanna Black  Age: 77 y.o.  DOB: 02/02/1946  Sex: female    Discussed patient's son regarding PET scan result.  No evidence of lytic bone lesion on PET-CT.  Patient did not want to have bone marrow biopsy done.  Return to clinic in 3 months for repeat labs.     Telemedicine visit encounter time: 5 min    Standley Dakins, MD

## 2023-10-23 NOTE — Telephone Encounter (Signed)
LVM with call back #.    Loman Chroman LPN

## 2023-10-23 NOTE — Telephone Encounter (Signed)
Patient called back; informed her of doctors notes, patient voiced understanding.  Hal Morales, [place] 10/23/2023 11:18

## 2023-10-23 NOTE — Telephone Encounter (Signed)
Order has been placed for Abx. to pharmacy. Thanks.

## 2023-10-27 ENCOUNTER — Ambulatory Visit (HOSPITAL_BASED_OUTPATIENT_CLINIC_OR_DEPARTMENT_OTHER): Payer: Self-pay | Admitting: Neurological Surgery

## 2023-10-27 NOTE — Progress Notes (Unsigned)
[facility]  [address]  [place], [place] [address]  P: [phone]  F: [phone]       NAME:  Joanna Black  DOB:  [birth year]  AGE:  77 y.o.  MRN:  [id number]  APPT:  10/28/2023     No chief complaint on file.       Subjective:     This is a case of a 77 y.o. year old female who comes in today for ***    Past Medical History:   Diagnosis Date    Arthritis     Asthma     Back problem     BMI 35.0-35.9,adult 02/16/2020    CAD (coronary artery disease)     mild    Cataract     Cataracts, bilateral     CHF (congestive heart failure) (CMS HCC)     Chronic bronchitis with emphysema (CMS HCC)     Chronic low back pain     Claustrophobia     Depression     Diabetes (CMS HCC)     Diabetes mellitus, type 2 (CMS HCC)     Encounter for support and coordination of transition of care 11/16/2022    Esophageal reflux     Essential hypertension 01/28/2019    H/O urinary tract infection     HH (hiatus hernia)     History of kidney disease     states, "low kidney functions."    Hypercholesterolemia     Hypertension     Hypertriglyceridemia     Type 2 diabetes mellitus (CMS HCC)          Past Surgical History:   Procedure Laterality Date    (10AM) CAUDAL EPIDURAL INJECTION  **DIABETIC** N/A 09/23/2023    Performed by Forbes Cellar, DO at [facility] OR MAIN    CATARACT EXTRACTION, BILATERAL Bilateral     bilateral lens implant    COLONOSCOPY      CYSTOSCOPY N/A 05/26/2023    Performed by Girtha Hake, MD at [facility] OR MAIN    ENDOSCOPIC U/S UPPER with FNA N/A 04/15/2023    Performed by Edwin Cap, DO at [facility] OR ENDO    ENDOSCOPIC U/S UPPER with FNA N/A 05/10/2022    Performed by Edwin Cap, DO at [facility] OR ENDO    ENDOSCOPIC ULTRASOUND ESOPHGEAL  04/15/2023    ESOPHAGOGASTRODUODENOSCOPY      GASTROSCOPY  05/10/2022    Performed by Edwin Cap, DO at [facility] OR ENDO    GASTROSCOPY WITH DILATION Left 09/24/2022    Performed by Joslyn Devon, MD at [facility] OR ENDO    GASTROSCOPY WITH DILATION Left 06/05/2022     Performed by Joslyn Devon, MD at [facility] OR ENDO    GASTROSCOPY WITH DILATION and bxs Left 05/07/2022    Performed by Joslyn Devon, MD at [facility] OR ENDO    HX CARPAL TUNNEL RELEASE      HX CATARACT REMOVAL Right 12/30/2019    HX CATARACT REMOVAL Left 01/06/2020    HX CHOLECYSTECTOMY      HX EXPOSURE TO METAL SHAVINGS      HX HEART CATHETERIZATION      HX HYSTERECTOMY      HX OOPHORECTOMY      HX TAH AND BSO      HX TUBAL LIGATION      HX VEIN STRIPPING      Left leg    PHACO WITH INTRAOCULAR LENS Left 01/06/2020    Performed by  Kathleen Argue, MD at [facility] OR MAIN    [facility] WITH INTRAOCULAR LENS Right 12/30/2019    Performed by Kathleen Argue, MD at [facility] OR MAIN     Family Medical History:       Problem Relation (Age of Onset)    Bone cancer Father    Diabetes Multiple family members    Hypertension (High Blood Pressure) Multiple family members    No Known Problems Mother    Stomach Cancer Paternal Grandmother            Social History     Socioeconomic History    Marital status: Married     Spouse name: Duke Salvia    Number of children: 3    Years of education: 13    Highest education level: High school graduate   Occupational History    Occupation: Retired   Tobacco Use    Smoking status: Never     Passive exposure: Never    Smokeless tobacco: Never   Vaping Use    Vaping status: Never Used   Substance and Sexual Activity    Alcohol use: No     Alcohol/week: 0.0 standard drinks of alcohol    Drug use: No    Sexual activity: Not Currently     Partners: Male   Other Topics Concern    Right hand dominant Yes    Ability to Walk 1 Flight of Steps without SOB/CP No    Ability To Do Own ADL's Yes   Social History Narrative    MARRIED.  Lives in single story home.  Heats home with electric and has well water.        April Haney, LPN  1/61/0960, 14:01     Social Determinants of Health     Financial Resource Strain: Low Risk  (04/17/2023)    Financial Resource Strain     SDOH Financial: No   Transportation Needs: Low Risk   (04/17/2023)    Transportation Needs     SDOH Transportation: No   Social Connections: Low Risk  (04/17/2023)    Social Connections     SDOH Social Isolation: 5 or more times a week   Intimate Partner Violence: Low Risk  (04/17/2023)    Intimate Partner Violence     SDOH Domestic Violence: No   Housing Stability: Low Risk  (04/17/2023)    Housing Stability     SDOH Housing Situation: I have housing.     SDOH Housing Worry: No     Current Outpatient Medications   Medication Sig    acetaminophen (TYLENOL) 500 mg Oral Tablet Take 1 Tablet (500 mg total) by mouth Every night as needed Takes 2 at night    albuterol sulfate (PROVENTIL OR VENTOLIN OR PROAIR) 90 mcg/actuation Inhalation oral inhaler Take 1-2 Puffs by inhalation Every 6 hours as needed    albuterol sulfate (PROVENTIL) 2.5 mg /3 mL (0.083 %) Inhalation nebulizer solution INHALE 1 VIAL BY MOUTH VIA NEBULIZER EVERY 4 HOURS AS NEEDED FOR WHEEZING    aspirin (ECOTRIN) 81 mg Oral Tablet, Delayed Release (E.C.) Take 1 Tablet (81 mg total) by mouth Once a day    azithromycin (ZITHROMAX) 250 mg Oral Tablet Take 500 mg (2 tab) on day 1; take 250 mg (1 tab) on days 2-5.    calcium carbonate/vitamin D3 (VITAMIN D-3 ORAL) Take 2,000 Int'l Units/day by mouth Once a day    conjugated estrogens (PREMARIN) 0.625 mg/gram Vaginal Cream Insert 0.5 g into the vagina Every night  diazePAM (VALIUM) 5 mg Oral Tablet Take 1 Tablet (5 mg total) by mouth Once, as needed for Anxiety for up to 1 dose    ezetimibe (ZETIA) 10 mg Oral Tablet Take 1 Tablet (10 mg total) by mouth Every evening    ferrous sulfate (FEOSOL) 325 mg (65 mg iron) Oral Tablet TAKE 1 TABLET (325 MG TOTAL) BY MOUTH ONCE A DAY FOR 3 MONTHS (Patient not taking: Reported on 10/07/2023)    FLUoxetine (PROZAC) 20 mg Oral Capsule Take 1 Capsule (20 mg total) by mouth Once a day    furosemide (LASIX) 20 mg Oral Tablet TAKE 1 TABLET (20 MG TOTAL) BY MOUTH TWICE PER DAY AS NEEDED    magnesium oxide (MAG-OX) 400 mg Oral Tablet  Take 1 Tablet (400 mg total) by mouth Three times a day (Patient not taking: Reported on 10/07/2023)    metoprolol succinate (TOPROL-XL) 25 mg Oral Tablet Sustained Release 24 hr Take 1 Tablet (25 mg total) by mouth Every night    omeprazole (PRILOSEC) 20 mg Oral Capsule, Delayed Release(E.C.) Take 1 Capsule (20 mg total) by mouth Once a day    predniSONE (DELTASONE) 5 mg Oral Tablet Take one 5 mg with your 1 mg prednisone tabs which you have at home for a total daily dose of 6 mg daily.    sarilumab (KEVZARA) 200 mg/1.14 mL Subcutaneous Pen Injector Inject 1 pen (200 mg) subcutaneously (under the skin) once every 14 days (Patient not taking: Reported on 10/07/2023)    tiZANidine (ZANAFLEX) 4 mg Oral Tablet TAKE 1 TABLET BY MOUTH THREE TIMES A DAY AS NEEDED FOR MUSCLE CRAMPS STRENGTH: 4 MG    trimethoprim (TRIMPEX) 100 mg Oral Tablet Take 1 Tablet (100 mg total) by mouth Once a day    TRULICITY 0.75 mg/0.5 mL Subcutaneous Pen Injector INJECT 0.5 ML SUBCUTANEOUSLY  EVERY 7 DAYS     Review of Systems  Objective:   There were no vitals taken for this visit.      Physical Exam    PHQ Questionnaire       Assessment/Plan   There are no diagnoses linked to this encounter.  {bmigant:32679}  No orders of the defined types were placed in this encounter.                Health Maintenance Due   Topic Date Due    Adult Tdap-Td (1 - Tdap) Never done    Diabetic Retinal Exam  10/25/2020    Osteoporosis screening  12/05/2022    Influenza Vaccine (1) 08/24/2023    Covid-19 Vaccine (5 - 2023-24 season) 08/24/2023    Shingles Vaccine (2 of 2) 10/13/2023       Controlled Substances Tracking      Follow up: No follow-ups on file.    The patient was given ample opportunity to ask questions and those questions were answered to the patient's satisfaction. The patient was encouraged to be involved in their own care, and all diagnoses, medications, and medication side-effects were discussed. The patient was told to contact me with any  additional questions or concerns, or go to the ED in the case of an emergency.       Adolpho Meenach, DO

## 2023-10-28 ENCOUNTER — Encounter (INDEPENDENT_AMBULATORY_CARE_PROVIDER_SITE_OTHER): Payer: Self-pay | Admitting: Family Medicine

## 2023-10-28 ENCOUNTER — Inpatient Hospital Stay
Admission: RE | Admit: 2023-10-28 | Discharge: 2023-10-28 | Disposition: A | Discharge status: Home / Self Care | Payer: Medicare PPO | Source: Ambulatory Visit | Attending: Physician Assistant | Admitting: Physician Assistant

## 2023-10-28 ENCOUNTER — Other Ambulatory Visit: Payer: Self-pay

## 2023-10-28 ENCOUNTER — Ambulatory Visit: Payer: Medicare PPO | Attending: Family Medicine | Admitting: Family Medicine

## 2023-10-28 ENCOUNTER — Inpatient Hospital Stay (HOSPITAL_BASED_OUTPATIENT_CLINIC_OR_DEPARTMENT_OTHER)
Admission: RE | Admit: 2023-10-28 | Discharge: 2023-10-28 | Disposition: A | Discharge status: Home / Self Care | Payer: Medicare PPO | Source: Ambulatory Visit | Attending: Family Medicine | Admitting: Family Medicine

## 2023-10-28 VITALS — BP 120/84 | HR 71 | Temp 97.6°F | Resp 18 | Ht 64.02 in | Wt 217.4 lb

## 2023-10-28 DIAGNOSIS — Z1331 Encounter for screening for depression: Secondary | ICD-10-CM | POA: Insufficient documentation

## 2023-10-28 DIAGNOSIS — I779 Disorder of arteries and arterioles, unspecified: Secondary | ICD-10-CM

## 2023-10-28 DIAGNOSIS — K219 Gastro-esophageal reflux disease without esophagitis: Secondary | ICD-10-CM | POA: Insufficient documentation

## 2023-10-28 DIAGNOSIS — E785 Hyperlipidemia, unspecified: Secondary | ICD-10-CM

## 2023-10-28 DIAGNOSIS — Z7985 Long-term (current) use of injectable non-insulin antidiabetic drugs: Secondary | ICD-10-CM | POA: Insufficient documentation

## 2023-10-28 DIAGNOSIS — N184 Chronic kidney disease, stage 4 (severe): Secondary | ICD-10-CM

## 2023-10-28 DIAGNOSIS — E1169 Type 2 diabetes mellitus with other specified complication: Secondary | ICD-10-CM | POA: Insufficient documentation

## 2023-10-28 DIAGNOSIS — I152 Hypertension secondary to endocrine disorders: Secondary | ICD-10-CM | POA: Insufficient documentation

## 2023-10-28 DIAGNOSIS — N183 Chronic kidney disease, stage 3 unspecified: Secondary | ICD-10-CM | POA: Insufficient documentation

## 2023-10-28 DIAGNOSIS — I517 Cardiomegaly: Secondary | ICD-10-CM | POA: Insufficient documentation

## 2023-10-28 DIAGNOSIS — I4510 Unspecified right bundle-branch block: Secondary | ICD-10-CM | POA: Insufficient documentation

## 2023-10-28 DIAGNOSIS — I129 Hypertensive chronic kidney disease with stage 1 through stage 4 chronic kidney disease, or unspecified chronic kidney disease: Secondary | ICD-10-CM | POA: Insufficient documentation

## 2023-10-28 DIAGNOSIS — Z789 Other specified health status: Secondary | ICD-10-CM

## 2023-10-28 DIAGNOSIS — I5030 Unspecified diastolic (congestive) heart failure: Secondary | ICD-10-CM | POA: Insufficient documentation

## 2023-10-28 DIAGNOSIS — Z6837 Body mass index (BMI) 37.0-37.9, adult: Secondary | ICD-10-CM | POA: Insufficient documentation

## 2023-10-28 DIAGNOSIS — I10 Essential (primary) hypertension: Secondary | ICD-10-CM | POA: Insufficient documentation

## 2023-10-28 DIAGNOSIS — E1159 Type 2 diabetes mellitus with other circulatory complications: Secondary | ICD-10-CM | POA: Insufficient documentation

## 2023-10-28 DIAGNOSIS — E1122 Type 2 diabetes mellitus with diabetic chronic kidney disease: Secondary | ICD-10-CM | POA: Insufficient documentation

## 2023-10-28 DIAGNOSIS — Z79899 Other long term (current) drug therapy: Secondary | ICD-10-CM | POA: Insufficient documentation

## 2023-10-28 DIAGNOSIS — E119 Type 2 diabetes mellitus without complications: Secondary | ICD-10-CM | POA: Insufficient documentation

## 2023-10-28 DIAGNOSIS — Z7982 Long term (current) use of aspirin: Secondary | ICD-10-CM | POA: Insufficient documentation

## 2023-10-28 MED ORDER — METOPROLOL SUCCINATE ER 25 MG TABLET,EXTENDED RELEASE 24 HR
25.0000 mg | ORAL_TABLET | Freq: Every evening | ORAL | 1 refills | Status: DC
Start: 2023-10-28 — End: 2024-04-26

## 2023-10-28 MED ORDER — OMEPRAZOLE 20 MG CAPSULE,DELAYED RELEASE
20.0000 mg | DELAYED_RELEASE_CAPSULE | Freq: Every day | ORAL | 1 refills | Status: DC
Start: 2023-10-28 — End: 2023-11-18

## 2023-10-28 MED ORDER — EZETIMIBE 10 MG TABLET
10.0000 mg | ORAL_TABLET | Freq: Every evening | ORAL | 1 refills | Status: DC
Start: 2023-10-28 — End: 2024-02-04

## 2023-10-30 DIAGNOSIS — I6523 Occlusion and stenosis of bilateral carotid arteries: Secondary | ICD-10-CM

## 2023-11-04 ENCOUNTER — Other Ambulatory Visit (INDEPENDENT_AMBULATORY_CARE_PROVIDER_SITE_OTHER): Payer: Self-pay | Admitting: Family Medicine

## 2023-11-04 DIAGNOSIS — R519 Headache, unspecified: Secondary | ICD-10-CM

## 2023-11-06 ENCOUNTER — Other Ambulatory Visit (INDEPENDENT_AMBULATORY_CARE_PROVIDER_SITE_OTHER): Payer: Self-pay | Admitting: Student in an Organized Health Care Education/Training Program

## 2023-11-06 DIAGNOSIS — M353 Polymyalgia rheumatica: Secondary | ICD-10-CM

## 2023-11-06 NOTE — Telephone Encounter (Signed)
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  Labs were completed on 08/18/2023  and results are listed below.  The next scheduled appointment date is 12/02/2023.  [facility] [place] is requesting a refill on Prednisone.  Message has been routed to Dr. Lynnea Maizes.    Lab Results   Component Value Date/Time    WBC 9.4 08/18/2023 09:16 AM    HGB 10.9 (L) 08/18/2023 09:16 AM    HCT 33.8 (L) 08/18/2023 09:16 AM    PLTCNT 424 08/18/2023 09:16 AM    RBC 3.76 (L) 08/18/2023 09:16 AM    MCV 89.8 08/18/2023 09:16 AM    MCHC 32.1 08/18/2023 09:16 AM    MCH 28.9 08/18/2023 09:16 AM    RDW 18.6 (H) 08/18/2023 09:16 AM    MPV 8.2 08/18/2023 09:16 AM      Lab Results   Component Value Date    CREATININE 1.87 (H) 08/18/2023      Hepatic Function  Lab Results   Component Value Date    ALBUMIN 3.3 (L) 09/17/2023    TOTALPROTEIN 7.4 09/17/2023    ALKPHOS 90 08/18/2023    PROTHROMTME 13.5 (H) 10/29/2022    INR 1.19 10/29/2022    AST 14 08/18/2023    ALT 12 08/18/2023    BILIRUBINCON 0.3 10/29/2022     Jana Half, LPN  66/44/0347,  14:51

## 2023-11-10 ENCOUNTER — Other Ambulatory Visit (INDEPENDENT_AMBULATORY_CARE_PROVIDER_SITE_OTHER): Payer: Self-pay | Admitting: Family Medicine

## 2023-11-10 DIAGNOSIS — J4540 Moderate persistent asthma, uncomplicated: Secondary | ICD-10-CM

## 2023-11-10 DIAGNOSIS — M353 Polymyalgia rheumatica: Secondary | ICD-10-CM

## 2023-11-10 MED ORDER — ALBUTEROL SULFATE 2.5 MG/3 ML (0.083 %) SOLUTION FOR NEBULIZATION
2.5000 mg | INHALATION_SOLUTION | Freq: Three times a day (TID) | RESPIRATORY_TRACT | 5 refills | Status: AC | PRN
Start: 2023-11-10 — End: ?

## 2023-11-10 MED ORDER — PREDNISONE 5 MG TABLET
ORAL_TABLET | ORAL | 0 refills | Status: DC
Start: 2023-11-10 — End: 2023-12-02

## 2023-11-13 ENCOUNTER — Ambulatory Visit (INDEPENDENT_AMBULATORY_CARE_PROVIDER_SITE_OTHER): Payer: Self-pay | Admitting: Family Medicine

## 2023-11-13 ENCOUNTER — Telehealth (INDEPENDENT_AMBULATORY_CARE_PROVIDER_SITE_OTHER): Payer: Self-pay | Admitting: Emergency Medicine

## 2023-11-13 DIAGNOSIS — R0600 Dyspnea, unspecified: Secondary | ICD-10-CM

## 2023-11-13 NOTE — Telephone Encounter (Signed)
Called patient to schedule L4/5, L5/S1 BIL MBB(#1 12/30/23, #2 01/13/24) RFA(R 01/27/24, L 02/10/24) all at 1145 arrive at 11 at [facility], follow up 03/01/24 at 130 with [facility] in [place].   Spoke with her son.     Went over procedure instructions with patient who verbalized understanding and had no further questions. Verified insurance [place] MEDICARE BUT MAY CHANGE IN JAN, TOLD HIM TO UPLOAD NEW CARD ON MY CHART and medication holds, NONE. Mailed instructions and appointment reminders to patient.         Otilio Connors

## 2023-11-13 NOTE — Telephone Encounter (Signed)
Patient called and said she is having difficulty breathing, it is not severe but she has been having issues with it in the past. Patient wanted to know if PCP could order a chest xray. Informed patient PCP was on vacation this week but will send message to PCP and will call back as soon something is sent back. Patient voiced understanding.  Hal Morales, [place] 11/13/2023 15:32

## 2023-11-14 NOTE — Telephone Encounter (Signed)
Please contact patient and get her scheduled for this coming Tue or Wed if possible. Thanks.

## 2023-11-14 NOTE — Telephone Encounter (Signed)
Called and asked patient if she could come in Tuesday or Wednesday per PCP, patient was agreeable to come in Tuesday and is scheduled.  Hal Morales, [place] 11/14/2023 12:45

## 2023-11-14 NOTE — Telephone Encounter (Signed)
Called and informed patient of doctors notes, patient voiced understanding and stated she is doing better this morning. Patient also stated she is still having some issues with shortness of breath, informed patient if something changes over weekend or next week to get CXR done. Patient voiced understanding and is worried that the shortness of breath maybe a stomach issues and wanted to know if she should be seen sooner?  Hal Morales, [place] 11/14/2023 09:39

## 2023-11-14 NOTE — Telephone Encounter (Signed)
Please make patient aware order has been placed for CXR. Pending results and patient's condition we may need to get her in for earlier follow up. She is currently scheduled in 01/2024. Thanks.

## 2023-11-17 NOTE — Progress Notes (Unsigned)
[facility]  [address]  [place], [place] [address]  P: [phone]  F: [phone]       NAME:  Joanna Black  DOB:  1946/02/05  AGE:  77 y.o.  MRN:  [id number]  APPT:  11/18/2023     No chief complaint on file.       Subjective:     This is a case of a 77 y.o. year old female who comes in today for ***    Past Medical History:   Diagnosis Date    Arthritis     Asthma     Back problem     BMI 35.0-35.9,adult 02/16/2020    CAD (coronary artery disease)     mild    Cataract     Cataracts, bilateral     CHF (congestive heart failure) (CMS HCC)     Chronic bronchitis with emphysema (CMS HCC)     Chronic low back pain     Claustrophobia     Depression     Diabetes (CMS HCC)     Diabetes mellitus, type 2 (CMS HCC)     Encounter for support and coordination of transition of care 11/16/2022    Esophageal reflux     Essential hypertension 01/28/2019    H/O urinary tract infection     HH (hiatus hernia)     History of kidney disease     states, "low kidney functions."    Hypercholesterolemia     Hypertension     Hypertriglyceridemia     Type 2 diabetes mellitus (CMS HCC)          Past Surgical History:   Procedure Laterality Date    (10AM) CAUDAL EPIDURAL INJECTION  **DIABETIC** N/A 09/23/2023    Performed by Forbes Cellar, DO at [facility] OR MAIN    CATARACT EXTRACTION, BILATERAL Bilateral     bilateral lens implant    COLONOSCOPY      CYSTOSCOPY N/A 05/26/2023    Performed by Girtha Hake, MD at [facility] OR MAIN    ENDOSCOPIC U/S UPPER with FNA N/A 04/15/2023    Performed by Edwin Cap, DO at [facility] OR ENDO    ENDOSCOPIC U/S UPPER with FNA N/A 05/10/2022    Performed by Edwin Cap, DO at [facility] OR ENDO    ENDOSCOPIC ULTRASOUND ESOPHGEAL  04/15/2023    ESOPHAGOGASTRODUODENOSCOPY      GASTROSCOPY  05/10/2022    Performed by Edwin Cap, DO at [facility] OR ENDO    GASTROSCOPY WITH DILATION Left 09/24/2022    Performed by Joslyn Devon, MD at [facility] OR ENDO    GASTROSCOPY WITH DILATION Left 06/05/2022     Performed by Joslyn Devon, MD at [facility] OR ENDO    GASTROSCOPY WITH DILATION and bxs Left 05/07/2022    Performed by Joslyn Devon, MD at [facility] OR ENDO    HX CARPAL TUNNEL RELEASE      HX CATARACT REMOVAL Right 12/30/2019    HX CATARACT REMOVAL Left 01/06/2020    HX CHOLECYSTECTOMY      HX EXPOSURE TO METAL SHAVINGS      HX HEART CATHETERIZATION      HX HYSTERECTOMY      HX OOPHORECTOMY      HX TAH AND BSO      HX TUBAL LIGATION      HX VEIN STRIPPING      Left leg    PHACO WITH INTRAOCULAR LENS Left 01/06/2020    Performed by  Kathleen Argue, MD at [facility] OR MAIN    [facility] WITH INTRAOCULAR LENS Right 12/30/2019    Performed by Kathleen Argue, MD at [facility] OR MAIN     Family Medical History:       Problem Relation (Age of Onset)    Bone cancer Father    Diabetes Multiple family members    Hypertension (High Blood Pressure) Multiple family members    No Known Problems Mother    Stomach Cancer Paternal Grandmother            Social History     Socioeconomic History    Marital status: Married     Spouse name: Duke Salvia    Number of children: 3    Years of education: 13    Highest education level: High school graduate   Occupational History    Occupation: Retired   Tobacco Use    Smoking status: Never     Passive exposure: Never    Smokeless tobacco: Never   Vaping Use    Vaping status: Never Used   Substance and Sexual Activity    Alcohol use: No     Alcohol/week: 0.0 standard drinks of alcohol    Drug use: No    Sexual activity: Not Currently     Partners: Male   Other Topics Concern    Right hand dominant Yes    Ability to Walk 1 Flight of Steps without SOB/CP No    Ability To Do Own ADL's Yes   Social History Narrative    MARRIED.  Lives in single story home.  Heats home with electric and has well water.        April Haney, LPN  03/06/1760, 14:01     Social Determinants of Health     Financial Resource Strain: Low Risk  (04/17/2023)    Financial Resource Strain     SDOH Financial: No   Transportation Needs: Low Risk   (04/17/2023)    Transportation Needs     SDOH Transportation: No   Social Connections: Low Risk  (04/17/2023)    Social Connections     SDOH Social Isolation: 5 or more times a week   Intimate Partner Violence: Low Risk  (04/17/2023)    Intimate Partner Violence     SDOH Domestic Violence: No   Housing Stability: Low Risk  (04/17/2023)    Housing Stability     SDOH Housing Situation: I have housing.     SDOH Housing Worry: No     Current Outpatient Medications   Medication Sig    acetaminophen (TYLENOL) 500 mg Oral Tablet Take 1 Tablet (500 mg total) by mouth Every night as needed Takes 2 at night    albuterol sulfate (PROVENTIL OR VENTOLIN OR PROAIR) 90 mcg/actuation Inhalation oral inhaler Take 1-2 Puffs by inhalation Every 6 hours as needed    albuterol sulfate (PROVENTIL) 2.5 mg /3 mL (0.083 %) Inhalation nebulizer solution Take 3 mL (2.5 mg total) by nebulization Three times a day as needed for Wheezing    aspirin (ECOTRIN) 81 mg Oral Tablet, Delayed Release (E.C.) Take 1 Tablet (81 mg total) by mouth Once a day    calcium carbonate/vitamin D3 (VITAMIN D-3 ORAL) Take 2,000 Int'l Units/day by mouth Once a day    conjugated estrogens (PREMARIN) 0.625 mg/gram Vaginal Cream Insert 0.5 g into the vagina Every night    diazePAM (VALIUM) 5 mg Oral Tablet Take 1 Tablet (5 mg total) by mouth Once, as needed for Anxiety for  up to 1 dose    ezetimibe (ZETIA) 10 mg Oral Tablet Take 1 Tablet (10 mg total) by mouth Every evening    ferrous sulfate (FEOSOL) 325 mg (65 mg iron) Oral Tablet TAKE 1 TABLET (325 MG TOTAL) BY MOUTH ONCE A DAY FOR 3 MONTHS    FLUoxetine (PROZAC) 20 mg Oral Capsule Take 1 Capsule (20 mg total) by mouth Once a day    furosemide (LASIX) 20 mg Oral Tablet TAKE 1 TABLET (20 MG TOTAL) BY MOUTH TWICE PER DAY AS NEEDED    magnesium oxide (MAG-OX) 400 mg Oral Tablet Take 1 Tablet (400 mg total) by mouth Three times a day    metoprolol succinate (TOPROL-XL) 25 mg Oral Tablet Sustained Release 24 hr Take 1 Tablet  (25 mg total) by mouth Every night    omeprazole (PRILOSEC) 20 mg Oral Capsule, Delayed Release(E.C.) Take 1 Capsule (20 mg total) by mouth Once a day    predniSONE (DELTASONE) 5 mg Oral Tablet Take one 5 mg with your 1 mg prednisone tabs which you have at home for a total daily dose of 6 mg daily.    sarilumab (KEVZARA) 200 mg/1.14 mL Subcutaneous Pen Injector Inject 1 pen (200 mg) subcutaneously (under the skin) once every 14 days    tiZANidine (ZANAFLEX) 4 mg Oral Tablet TAKE 1 TABLET BY MOUTH THREE TIMES A DAY AS NEEDED FOR MUSCLE CRAMPS STRENGTH: 4 MG    trimethoprim (TRIMPEX) 100 mg Oral Tablet Take 1 Tablet (100 mg total) by mouth Once a day    TRULICITY 0.75 mg/0.5 mL Subcutaneous Pen Injector INJECT 0.5 ML SUBCUTANEOUSLY  EVERY 7 DAYS     Review of Systems  Objective:   There were no vitals taken for this visit.      Physical Exam    PHQ Questionnaire       Assessment/Plan   There are no diagnoses linked to this encounter.  {bmigant:32679}  No orders of the defined types were placed in this encounter.                Health Maintenance Due   Topic Date Due    Adult Tdap-Td (1 - Tdap) Never done    RSV Pregnant or 60+ (1 - 1-dose 75+ series) Never done    Diabetic Retinal Exam  10/25/2020    Osteoporosis screening  12/05/2022    Influenza Vaccine (1) 08/24/2023    Covid-19 Vaccine (5 - 2024-25 season) 08/24/2023    Shingles Vaccine (2 of 2) 10/13/2023       Controlled Substances Tracking      Follow up: No follow-ups on file.    The patient was given ample opportunity to ask questions and those questions were answered to the patient's satisfaction. The patient was encouraged to be involved in their own care, and all diagnoses, medications, and medication side-effects were discussed. The patient was told to contact me with any additional questions or concerns, or go to the ED in the case of an emergency.       Karyme Mcconathy, DO

## 2023-11-18 ENCOUNTER — Other Ambulatory Visit: Payer: Self-pay

## 2023-11-18 ENCOUNTER — Ambulatory Visit: Payer: Medicare PPO | Attending: Family Medicine | Admitting: Family Medicine

## 2023-11-18 ENCOUNTER — Encounter (INDEPENDENT_AMBULATORY_CARE_PROVIDER_SITE_OTHER): Payer: Self-pay | Admitting: Family Medicine

## 2023-11-18 VITALS — BP 126/84 | HR 70 | Temp 97.5°F | Resp 18 | Ht 64.02 in | Wt 217.8 lb

## 2023-11-18 DIAGNOSIS — E1169 Type 2 diabetes mellitus with other specified complication: Secondary | ICD-10-CM | POA: Insufficient documentation

## 2023-11-18 DIAGNOSIS — Z79899 Other long term (current) drug therapy: Secondary | ICD-10-CM | POA: Insufficient documentation

## 2023-11-18 DIAGNOSIS — E1122 Type 2 diabetes mellitus with diabetic chronic kidney disease: Secondary | ICD-10-CM | POA: Insufficient documentation

## 2023-11-18 DIAGNOSIS — Z6837 Body mass index (BMI) 37.0-37.9, adult: Secondary | ICD-10-CM | POA: Insufficient documentation

## 2023-11-18 DIAGNOSIS — E785 Hyperlipidemia, unspecified: Secondary | ICD-10-CM | POA: Insufficient documentation

## 2023-11-18 DIAGNOSIS — I152 Hypertension secondary to endocrine disorders: Secondary | ICD-10-CM | POA: Insufficient documentation

## 2023-11-18 DIAGNOSIS — Z7985 Long-term (current) use of injectable non-insulin antidiabetic drugs: Secondary | ICD-10-CM | POA: Insufficient documentation

## 2023-11-18 DIAGNOSIS — N183 Chronic kidney disease, stage 3 unspecified: Secondary | ICD-10-CM | POA: Insufficient documentation

## 2023-11-18 DIAGNOSIS — E1159 Type 2 diabetes mellitus with other circulatory complications: Secondary | ICD-10-CM | POA: Insufficient documentation

## 2023-11-18 DIAGNOSIS — E119 Type 2 diabetes mellitus without complications: Secondary | ICD-10-CM | POA: Insufficient documentation

## 2023-11-18 DIAGNOSIS — K219 Gastro-esophageal reflux disease without esophagitis: Secondary | ICD-10-CM | POA: Insufficient documentation

## 2023-11-18 DIAGNOSIS — R0609 Other forms of dyspnea: Secondary | ICD-10-CM | POA: Insufficient documentation

## 2023-11-18 MED ORDER — OMEPRAZOLE 20 MG CAPSULE,DELAYED RELEASE
20.0000 mg | DELAYED_RELEASE_CAPSULE | Freq: Two times a day (BID) | ORAL | 1 refills | Status: DC
Start: 2023-11-18 — End: 2023-12-11

## 2023-11-19 ENCOUNTER — Other Ambulatory Visit: Payer: Self-pay

## 2023-11-19 ENCOUNTER — Telehealth (INDEPENDENT_AMBULATORY_CARE_PROVIDER_SITE_OTHER): Payer: Self-pay | Admitting: Family Medicine

## 2023-11-19 NOTE — Telephone Encounter (Signed)
Pt was called to let her know that she has been scheduled for an ECG and PFT Starting at 1:00 pm @ [facility] on Dec. 3. Spoke to patients son and he verbally understood.

## 2023-11-25 ENCOUNTER — Inpatient Hospital Stay (HOSPITAL_COMMUNITY)
Admission: RE | Admit: 2023-11-25 | Discharge: 2023-11-25 | Disposition: A | Discharge status: Home / Self Care | Payer: Medicare PPO | Source: Ambulatory Visit | Attending: Family Medicine | Admitting: Family Medicine

## 2023-11-25 ENCOUNTER — Other Ambulatory Visit: Payer: Self-pay

## 2023-11-25 ENCOUNTER — Inpatient Hospital Stay
Admission: RE | Admit: 2023-11-25 | Discharge: 2023-11-25 | Disposition: A | Discharge status: Home / Self Care | Payer: Medicare PPO | Source: Ambulatory Visit | Attending: Family Medicine | Admitting: Family Medicine

## 2023-11-25 DIAGNOSIS — R0609 Other forms of dyspnea: Secondary | ICD-10-CM

## 2023-11-25 MED ORDER — ALBUTEROL SULFATE 2.5 MG/3 ML (0.083 %) SOLUTION FOR NEBULIZATION
2.5000 mg | INHALATION_SOLUTION | RESPIRATORY_TRACT | Status: AC
Start: 2023-11-25 — End: 2023-11-25
  Administered 2023-11-25: 2.5 mg via RESPIRATORY_TRACT
  Filled 2023-11-25: qty 3

## 2023-11-26 DIAGNOSIS — R0609 Other forms of dyspnea: Secondary | ICD-10-CM | POA: Insufficient documentation

## 2023-11-28 LAB — ECG 12-LEAD
Atrial Rate: 58 {beats}/min
Calculated P Axis: 55 degrees
Calculated R Axis: -41 degrees
Calculated T Axis: -37 degrees
PR Interval: 200 ms
QRS Duration: 114 ms
QT Interval: 440 ms
QTC Calculation: 431 ms
Ventricular rate: 58 {beats}/min

## 2023-12-02 ENCOUNTER — Other Ambulatory Visit (INDEPENDENT_AMBULATORY_CARE_PROVIDER_SITE_OTHER): Payer: Self-pay | Admitting: Family Medicine

## 2023-12-02 ENCOUNTER — Other Ambulatory Visit: Payer: Self-pay

## 2023-12-02 ENCOUNTER — Ambulatory Visit (INDEPENDENT_AMBULATORY_CARE_PROVIDER_SITE_OTHER): Payer: Medicare PPO

## 2023-12-02 ENCOUNTER — Encounter (INDEPENDENT_AMBULATORY_CARE_PROVIDER_SITE_OTHER): Payer: Self-pay | Admitting: Student in an Organized Health Care Education/Training Program

## 2023-12-02 ENCOUNTER — Ambulatory Visit
Payer: Medicare PPO | Attending: Student in an Organized Health Care Education/Training Program | Admitting: Student in an Organized Health Care Education/Training Program

## 2023-12-02 VITALS — BP 114/78 | HR 75 | Temp 97.8°F | Ht 64.0 in | Wt 217.6 lb

## 2023-12-02 DIAGNOSIS — J449 Chronic obstructive pulmonary disease, unspecified: Secondary | ICD-10-CM

## 2023-12-02 DIAGNOSIS — M353 Polymyalgia rheumatica: Secondary | ICD-10-CM | POA: Insufficient documentation

## 2023-12-02 DIAGNOSIS — D472 Monoclonal gammopathy: Secondary | ICD-10-CM | POA: Insufficient documentation

## 2023-12-02 DIAGNOSIS — M810 Age-related osteoporosis without current pathological fracture: Secondary | ICD-10-CM | POA: Insufficient documentation

## 2023-12-02 DIAGNOSIS — J984 Other disorders of lung: Secondary | ICD-10-CM

## 2023-12-02 DIAGNOSIS — R001 Bradycardia, unspecified: Secondary | ICD-10-CM

## 2023-12-02 DIAGNOSIS — E1159 Type 2 diabetes mellitus with other circulatory complications: Secondary | ICD-10-CM

## 2023-12-02 DIAGNOSIS — R0600 Dyspnea, unspecified: Secondary | ICD-10-CM

## 2023-12-02 DIAGNOSIS — Z23 Encounter for immunization: Secondary | ICD-10-CM | POA: Insufficient documentation

## 2023-12-02 LAB — C-REACTIVE PROTEIN(CRP),INFLAMMATION: CRP INFLAMMATION: 7.5 mg/L (ref <8.0)

## 2023-12-02 MED ORDER — PREDNISONE 5 MG TABLET
5.0000 mg | ORAL_TABLET | Freq: Every day | ORAL | 0 refills | Status: AC
Start: 2023-12-02 — End: 2024-01-01

## 2023-12-02 MED ORDER — PREDNISONE 1 MG TABLET
ORAL_TABLET | ORAL | 0 refills | Status: DC
Start: 2023-12-02 — End: 2024-02-20

## 2023-12-02 NOTE — Patient Instructions (Addendum)
Prednisone taper provided    Prolia should be started as soon as you are comfortable (osteoporosis medication)

## 2023-12-02 NOTE — Nursing Note (Signed)
Venipuncture performed in office.  [name], MA 12/02/2023, 14:45

## 2023-12-02 NOTE — Progress Notes (Signed)
[facility]  [address]  [facility] [id number]    HPI:  This is a case of a 77 y.o. year old female who comes in today for follow-up of polymyalgia rheumatica    Medical conditions notable for:  -hospitalized 10/29/22-11/01/22 for acute hypoxic respiratory failure.  That have significant proximal pain and weakness in her shoulders and hips with elevated inflammatory markers.  Discharged on prednisone 50 mg daily.  -obesity BMI 36   -asthma   -coronary artery disease   -CKD stage 3   -hypertension    Polymyalgia rheumatica regimen   -prednisone 5 mg daily    Since last visit patient did not start taking Kevzara.  She would continue to take prednisone.  She reduce the dose from 6 mg to 5 mg about 3 weeks ago.  She had a bad flare-up of joint pain particularly in her shoulders and elbows and neck in September which responded well to higher doses of corticosteroids.  Today feeling very well.  Pain rated at 0/10.  She has follow up with pain medicine for injections in her back.    Initial HPI listed below  She was having neck and shoulder pain. She had been getting left sided frontal headaches at that time. No jaw locking or issues with her tongue. She had some blurry vision around that time, but no vision loss. Patient is currently on prednisone 5 mg daily for PMR for around a month. Shoulders are back to her baseline. She has had issues with heartburn before but it is well controlled currently on omeprazole. She has had esophageal dilations in the past.     Denies having any shortness of breath, chest pain, abdominal pain, fevers, unintential weight loss.    All systems reviewed and are otherwise unremarkable unless stated above.    Social Hx- no alcohol use, no smoking, resides in [place]   Family Hx- no family hx of RA or SLE    Current Outpatient Medications   Medication Sig Dispense Refill    acetaminophen (TYLENOL) 500 mg Oral Tablet Take 1 Tablet (500 mg total) by mouth Every  night as needed Takes 2 at night      albuterol sulfate (PROVENTIL OR VENTOLIN OR PROAIR) 90 mcg/actuation Inhalation oral inhaler Take 1-2 Puffs by inhalation Every 6 hours as needed 1 Each 3    albuterol sulfate (PROVENTIL) 2.5 mg /3 mL (0.083 %) Inhalation nebulizer solution Take 3 mL (2.5 mg total) by nebulization Three times a day as needed for Wheezing 300 mL 5    aspirin (ECOTRIN) 81 mg Oral Tablet, Delayed Release (E.C.) Take 1 Tablet (81 mg total) by mouth Once a day      calcium carbonate/vitamin D3 (VITAMIN D-3 ORAL) Take 2,000 Int'l Units/day by mouth Once a day      conjugated estrogens (PREMARIN) 0.625 mg/gram Vaginal Cream Insert 0.5 g into the vagina Every night (Patient not taking: Reported on 12/02/2023) 30 g 5    diazePAM (VALIUM) 5 mg Oral Tablet Take 1 Tablet (5 mg total) by mouth Once, as needed for Anxiety for up to 1 dose 1 Tablet 0    ezetimibe (ZETIA) 10 mg Oral Tablet Take 1 Tablet (10 mg total) by mouth Every evening 90 Tablet 1    ferrous sulfate (FEOSOL) 325 mg (65 mg iron) Oral Tablet TAKE 1 TABLET (325 MG TOTAL) BY MOUTH ONCE A DAY FOR 3 MONTHS (Patient not taking: Reported on 12/02/2023) 90 Tablet 0  FLUoxetine (PROZAC) 20 mg Oral Capsule Take 1 Capsule (20 mg total) by mouth Once a day 90 Capsule 1    furosemide (LASIX) 20 mg Oral Tablet TAKE 1 TABLET (20 MG TOTAL) BY MOUTH TWICE PER DAY AS NEEDED 180 Tablet 1    magnesium oxide (MAG-OX) 400 mg Oral Tablet Take 1 Tablet (400 mg total) by mouth Three times a day 30 Tablet 0    metoprolol succinate (TOPROL-XL) 25 mg Oral Tablet Sustained Release 24 hr Take 1 Tablet (25 mg total) by mouth Every night 90 Tablet 1    omeprazole (PRILOSEC) 20 mg Oral Capsule, Delayed Release(E.C.) Take 1 Capsule (20 mg total) by mouth Twice daily 180 Capsule 1    predniSONE (DELTASONE) 1 mg Oral Tablet Take 4 Tablets (4 mg total) by mouth Once a day for 50 days, THEN 3 Tablets (3 mg total) Once a day for 50 days. 350 Tablet 0    predniSONE (DELTASONE) 5  mg Oral Tablet Take 1 Tablet (5 mg total) by mouth Once a day for 30 days 30 Tablet 0    sarilumab (KEVZARA) 200 mg/1.14 mL Subcutaneous Pen Injector Inject 1 pen (200 mg) subcutaneously (under the skin) once every 14 days (Patient not taking: Reported on 12/02/2023) 2.28 mL 11    tiZANidine (ZANAFLEX) 4 mg Oral Tablet TAKE 1 TABLET BY MOUTH THREE TIMES A DAY AS NEEDED FOR MUSCLE CRAMPS STRENGTH: 4 MG 270 Tablet 1    trimethoprim (TRIMPEX) 100 mg Oral Tablet Take 1 Tablet (100 mg total) by mouth Once a day 90 Tablet 3    TRULICITY 0.75 mg/0.5 mL Subcutaneous Pen Injector INJECT 0.5 ML SUBCUTANEOUSLY  EVERY 7 DAYS 6 mL 3     No current facility-administered medications for this visit.     Allergies   Allergen Reactions    Amoxicillin  Other Adverse Reaction (Add comment)     Unknown reaction    Augmentin [Amoxicillin-Pot Clavulanate] Nausea/ Vomiting    Statins-Hmg-Coa Reductase Inhibitors Myalgia     Past Medical History:   Diagnosis Date    Arthritis     Asthma     Back problem     BMI 35.0-35.9,adult 02/16/2020    CAD (coronary artery disease)     mild    Cataract     Cataracts, bilateral     CHF (congestive heart failure) (CMS HCC)     Chronic bronchitis with emphysema (CMS HCC)     Chronic low back pain     Claustrophobia     Depression     Diabetes (CMS HCC)     Diabetes mellitus, type 2 (CMS HCC)     Encounter for support and coordination of transition of care 11/16/2022    Esophageal reflux     Essential hypertension 01/28/2019    H/O urinary tract infection     HH (hiatus hernia)     History of kidney disease     states, "low kidney functions."    Hypercholesterolemia     Hypertension     Hypertriglyceridemia     Type 2 diabetes mellitus (CMS HCC)          Past Surgical History:   Procedure Laterality Date    CATARACT EXTRACTION, BILATERAL Bilateral     bilateral lens implant    COLONOSCOPY      ENDOSCOPIC ULTRASOUND ESOPHGEAL  04/15/2023    ESOPHAGOGASTRODUODENOSCOPY      HX CARPAL TUNNEL RELEASE      HX  CATARACT REMOVAL  Right 12/30/2019    HX CATARACT REMOVAL Left 01/06/2020    HX CHOLECYSTECTOMY      HX EXPOSURE TO METAL SHAVINGS      HX HEART CATHETERIZATION      HX HYSTERECTOMY      HX OOPHORECTOMY      HX TAH AND BSO      HX TUBAL LIGATION      HX VEIN STRIPPING      Left leg     PHYSICAL EXAM:  VITALS: BP 114/78   Pulse 75   Temp 36.6 C (97.8 F) (Thermal Scan)   Ht 1.626 m (5\' 4" )   Wt 98.7 kg (217 lb 9.5 oz)   SpO2 95%   BMI 37.35 kg/m     General- well appearing woman resting comfortably in chair  Neurologic- no focal deficits noted, speaks in full sentences and answers questions appropriately   Skin-  no rashes noted on exposed skin, normal appearing finger nails bilaterally   Musculoskeletal- no synovitis, normal range of motion shoulders bilaterally,5/5 muscle strength in upper and lower extremities  Psych- appropriate mood and corresponding affect     Labs/imaging:  Labs reviewed from 08/18/2023   WBC 9.4, hemoglobin 10.9, platelet count 424 (baseline platelet count typically in the 200's)      ESR 15, CRP 29 (CRP 70 from 2 weeks prior)    Region BMD  (g/cm) Young Adult  T-score Age Matched  Z-score   AP Spine 1.085 -0.8 0.0   Left Hip total 0.815 -1.5 -0.5   Left Hip Femoral Neck 0.729 -2.2 -0.9     Hip total           Hip Femoral Neck           Forearm           Forearm            Note: T-Scores (Standard deviation from normal young adult mean)  Normal T-Score > -1 BMD within 1 SD of a "young normal" adult   Osteopenia (low bone mass) T-Score = -1.0 to -2.5 BMD is between 1 and 2.5 SD below that of a "young normal" adult   Osteoporosis T-Score < -2.5 BMD is 2.5 SD or more below that of a "young normal" adult      FRAX* Results:      10-Year Probability of Fracture   Major Osteoporotic Fracture  20.6%% Hip Fracture  5.4%%         Assessment and Plan:  77 y.o. year old female presents today for follow-up of polymyalgia rheumatica.  Patient had significantly elevated inflammatory markers last  fall when she was symptomatic from scalp tenderness, headaches, shoulder and girdle stiffness which resolved with high doses of corticosteroids.  Temporal artery biopsy negative.      Patient had good response to steroid taper however developed worsening shoulder stiffness, pain, and headaches when she went from 3 mg daily prednisone to 2 mg daily prednisone.  Patient had another reoccurrence of symptoms with prednisone taper.  She was currently on 6 mg prednisone and doing well.  It was recommended to start Kevzara due to symptoms being refractory to corticosteroid taper.  Carlis Abbott was approved however patient ultimately did not start.  She is also unsure what the co-pay was.  Recommended holding on Kevzara continue slow corticosteroid dose to see how patient does.  Plan outlined below    Oncology note from 09/17/2023 reviewed.  1. Polymyalgia rheumatica (CMS HCC)  - PET  scan from 10/16/2023 not reveal any evidence of inflammatory periarticular arthritis  - at this point would recommend continuation of corticosteroids with slow taper.  If unable to taper patient below 5 mg prednisone daily then would recommend  re-initiation of Kevzara.  Patient counseled in detail about Kevzara.  - recommend 5 mg daily prednisone for 30 days Then reduce dose to 4 mg for 50 days, then 3 mg for 50 days (prolonged course due to her having flare when on 1 mg dose reduction q. 3 weeks regimen)  - C-REACTIVE PROTEIN(CRP),INFLAMMATION; Future  - predniSONE (DELTASONE) 5 mg Oral Tablet; Take 1 Tablet (5 mg total) by mouth Once a day for 30 days  Dispense: 30 Tablet; Refill: 0  - predniSONE (DELTASONE) 1 mg Oral Tablet; Take 4 Tablets (4 mg total) by mouth Once a day for 50 days, THEN 3 Tablets (3 mg total) Once a day for 50 days.  Dispense: 350 Tablet; Refill: 0    Osteoporosis  - patient counseled that recommend initiation of Prolia.  She would like to wait until she has back injections.  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 Encounter for immunization    - Flu Vaccine High-Dose, 65+,0.5 mL IM (Admin)      On the day of the encounter, a total of at least 21 minutes was spent on this patient encounter including review of historical information, examination, documentation and post-visit activities.      Return in about 3 months (around 03/01/2024) for In Person Visit, PMR.    Feliz Beam, MD

## 2023-12-11 ENCOUNTER — Ambulatory Visit
Payer: Medicare PPO | Attending: Student in an Organized Health Care Education/Training Program | Admitting: Student in an Organized Health Care Education/Training Program

## 2023-12-11 ENCOUNTER — Encounter (HOSPITAL_BASED_OUTPATIENT_CLINIC_OR_DEPARTMENT_OTHER): Payer: Self-pay | Admitting: Student in an Organized Health Care Education/Training Program

## 2023-12-11 ENCOUNTER — Other Ambulatory Visit: Payer: Self-pay

## 2023-12-11 VITALS — BP 148/77 | HR 85 | Temp 97.8°F | Resp 16 | Ht 65.0 in | Wt 215.4 lb

## 2023-12-11 DIAGNOSIS — K222 Esophageal obstruction: Secondary | ICD-10-CM | POA: Insufficient documentation

## 2023-12-11 DIAGNOSIS — D509 Iron deficiency anemia, unspecified: Secondary | ICD-10-CM | POA: Insufficient documentation

## 2023-12-11 DIAGNOSIS — K219 Gastro-esophageal reflux disease without esophagitis: Secondary | ICD-10-CM | POA: Insufficient documentation

## 2023-12-11 DIAGNOSIS — Z860100 Personal history of colon polyps, unspecified: Secondary | ICD-10-CM | POA: Insufficient documentation

## 2023-12-11 DIAGNOSIS — K5730 Diverticulosis of large intestine without perforation or abscess without bleeding: Secondary | ICD-10-CM | POA: Insufficient documentation

## 2023-12-11 DIAGNOSIS — R142 Eructation: Secondary | ICD-10-CM | POA: Insufficient documentation

## 2023-12-11 DIAGNOSIS — R140 Abdominal distension (gaseous): Secondary | ICD-10-CM | POA: Insufficient documentation

## 2023-12-11 MED ORDER — FERROUS SULFATE 325 MG (65 MG IRON) TABLET
325.0000 mg | ORAL_TABLET | Freq: Every day | ORAL | 0 refills | Status: AC
Start: 2023-12-11 — End: 2024-04-13

## 2023-12-11 MED ORDER — OMEPRAZOLE 20 MG CAPSULE,DELAYED RELEASE
20.0000 mg | DELAYED_RELEASE_CAPSULE | Freq: Two times a day (BID) | ORAL | 3 refills | Status: DC
Start: 2023-12-11 — End: 2024-03-10

## 2023-12-11 NOTE — Progress Notes (Signed)
[facility]                                                      Department of Gastroenterology  [address]  [place], [place] [address]    Date:   12/11/2023  Name: Joanna Black  Age: 77 y.o.    Referring Provider:    No referring provider defined for this encounter.    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 female with past medical history of cholecystectomy, colon polyps, chronic diarrhea, gastroesophageal reflux disease, esophageal stricture with dilatation, coronary artery disease, hypertension, hyperlipidemia, arthritis, obesity, diverticulosis      Clinic visit  04-19-2022   =================  Patient is here for new visit   Patient complaining of worsening dysphagia, she has history of esophageal stricture with previous dilatation and [facility], she states that she did not have dilatation to the maximum because unavailability of larger sized balloons   She is unable to tolerate liquids or solids recently, everything gets stuck in the chest  She has history of colon polyps, colonoscopy long time ago, no report available, due for surveillance  No GI cancer in family    She has cholecystectomy before, chronic diarrhea mainly after eating, previously treated with colestipol with partial improvement  She has heartburn, taking famotidine, her PCP switch PPI to famotidine due to concern for side effects, she has worsening symptoms and indigestion  No NSAIDs use  No smoking or drinking    The patient denies any abdominal pain, nausea, vomiting, abdominal bloating, constipation,  rectal bleeding, melena,  hematemesis, unintentional weight loss or other complaints.    Clinic Visit 06/15/2022:  Ms. [name] is here today for follow up s/p EGD as discussed below. She is doing better and currently denies any GI concerns. She still experiences postprandial dirrhea. Pt states she was on cholestyramine which then caused constipation.  Patient stopped taking cholestyramine due to constipation and also disliking the taste of it.  GERD is improved with famotidine.  Patient states it was better controlled with omeprazole in the past.  She has CT AP scheduled later today to evaluate duodenal segment epithelial lesion.  This was evaluated recently by EUS which was unremarkable for any malignant cells, CT AP was then ordered.        Clinic visit   May-9-24    =================  Patient is here for follow up   Patient underwent EGD October 2023 with dilatation of Schatzki's ring up to 20 mm, dysphagia improved..    EGD October 2023 showed normal upper middle esophagus, normal lower 3rd of esophagus, regular Z-line at 34 cm no esophagitis.  Low-grade narrowing of Schatzki's ring dilated from 18 mm to 20 mm with mild resistance at 20 mm, the ring was also instructed using cold forceps cm hiatal hernia Hill grade 2.  Bile in the stomach was aspirated, normal gastric body and antrum, normal  duodenal bulb, submucosal nodule in the 2nd part of the duodenum    EUS FNA April 15, 2023 for duodenal submucosal lesion showed 18 x 14 mm submucosal lesion in the 2nd portion of duodenum this is smaller than previously measured lesion biopsies were taken    Satisfactory for evaluation.  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    Comments    Immunostains for CK AE1/AE3, smooth muscle actin, S100, CD117, DOG 1, and SOX 10 have been performed, supporting the above diagnosis.  The differential diagnosis includes  peripheral nerve sheath tumor, ganglioneuroma, and mucosal Schwann cell hamartoma.     Heartburn controlled with omeprazole  Patient has iron-deficiency anemia  Not taking iron, denies any overt GI bleeding    Would like to defer colonoscopy at this time and will discuss next visit  Diarrhea resolved  The patient denies any abdominal pain, nausea, vomiting, abdominal bloating, constipation, diarrhea, rectal bleeding, melena, hematemesis, unintentional weight loss or other complaints.        Clinic visit  December-19-24   =================  Patient is here for follow up   Heartburn controlled with omeprazole  Has iron-deficiency anemia, repeat CBC and iron studies was prescribed oral iron last visit  Recommend to continue oral iron daily for another 3-6 months  Patient would like to have her colonoscopy with her local GI doctor, recommended to be completed for iron-deficiency anemia  Diarrhea resolved, has regular bowel movements  Denies current dysphagia  Patient increased her omeprazole 20 mg p.o. b.i.d. due to worsening bloating and belching, symptoms improved with PPI b.i.d. requesting refill      The patient denies any  difficulty with swallowing, abdominal pain, nausea, vomiting,  constipation, diarrhea, rectal bleeding, melena, hematemesis, unintentional weight loss or other complaints.    Review of Systems  See HPI.  No chest pain or sob  No fever or chills     Past Medical History: She  has a past medical history of Arthritis, Asthma, Back problem, BMI 35.0-35.9,adult (02/16/2020), CAD (coronary artery disease), Cataract, Cataracts, bilateral, CHF (congestive heart failure) (CMS HCC), Chronic bronchitis with emphysema (CMS HCC), Chronic low back pain, Claustrophobia, Depression, Diabetes (CMS HCC), Diabetes mellitus, type 2 (CMS HCC), Encounter for support and coordination of transition of care (11/16/2022), Esophageal reflux, Essential hypertension (01/28/2019), H/O urinary tract infection, HH (hiatus  hernia), History of kidney disease, Hypercholesterolemia, Hypertension, Hypertriglyceridemia, and Type 2 diabetes mellitus (CMS HCC).    She has no past medical history of Breast CA (CMS HCC), Cancer (CMS HCC), Cervical cancer (CMS HCC), Colon cancer (CMS HCC), Endometrial cancer (CMS HCC), Ovarian cancer (CMS HCC), or Uterine cancer (CMS HCC).      Past Surgical History: She  has a past surgical history that includes hx hysterectomy; hx carpal tunnel release; hx tubal ligation; hx gall bladder surgery/chole; hx vein stripping; hx exposure to metal shavings; Esophagogastroduodenoscopy; colonoscopy; hx tah and bso; hx heart catheterization; Cataract extraction, bilateral (Bilateral); hx cataract removal (Right, 12/30/2019); hx cataract removal (Left, 01/06/2020); hx oophorectomy; and endoscopic ultrasound esophgeal (04/15/2023).    Social History: Her  reports that she has never smoked. She has never been exposed to tobacco smoke. She has never used smokeless tobacco. She reports that she does not drink alcohol and does not use drugs., ,     Family History: She family history includes Bone cancer in her father;  Diabetes in her multiple family members; Hypertension (High Blood Pressure) in her multiple family members; No Known Problems in her mother; Stomach Cancer in her paternal grandmother.    Allergies: She is allergic to amoxicillin, augmentin [amoxicillin-pot clavulanate], and statins-hmg-coa reductase inhibitors.    Home Medication:   Current Outpatient Medications   Medication Sig    acetaminophen (TYLENOL) 500 mg Oral Tablet Take 1 Tablet (500 mg total) by mouth Every night as needed Takes 2 at night    albuterol sulfate (PROVENTIL OR VENTOLIN OR PROAIR) 90 mcg/actuation Inhalation oral inhaler Take 1-2 Puffs by inhalation Every 6 hours as needed    albuterol sulfate (PROVENTIL) 2.5 mg /3 mL (0.083 %) Inhalation nebulizer solution Take 3 mL (2.5 mg total) by nebulization Three times a day as needed for  Wheezing    aspirin (ECOTRIN) 81 mg Oral Tablet, Delayed Release (E.C.) Take 1 Tablet (81 mg total) by mouth Once a day    calcium carbonate/vitamin D3 (VITAMIN D-3 ORAL) Take 2,000 Int'l Units/day by mouth Once a day    conjugated estrogens (PREMARIN) 0.625 mg/gram Vaginal Cream Insert 0.5 g into the vagina Every night (Patient not taking: Reported on 12/02/2023)    diazePAM (VALIUM) 5 mg Oral Tablet Take 1 Tablet (5 mg total) by mouth Once, as needed for Anxiety for up to 1 dose    ezetimibe (ZETIA) 10 mg Oral Tablet Take 1 Tablet (10 mg total) by mouth Every evening    ferrous sulfate (FEOSOL) 325 mg (65 mg iron) Oral Tablet TAKE 1 TABLET (325 MG TOTAL) BY MOUTH ONCE A DAY FOR 3 MONTHS (Patient not taking: Reported on 12/02/2023)    FLUoxetine (PROZAC) 20 mg Oral Capsule Take 1 Capsule (20 mg total) by mouth Once a day    furosemide (LASIX) 20 mg Oral Tablet TAKE 1 TABLET (20 MG TOTAL) BY MOUTH TWICE PER DAY AS NEEDED    magnesium oxide (MAG-OX) 400 mg Oral Tablet Take 1 Tablet (400 mg total) by mouth Three times a day    metoprolol succinate (TOPROL-XL) 25 mg Oral Tablet Sustained Release 24 hr Take 1 Tablet (25 mg total) by mouth Every night    omeprazole (PRILOSEC) 20 mg Oral Capsule, Delayed Release(E.C.) Take 1 Capsule (20 mg total) by mouth Twice daily    predniSONE (DELTASONE) 1 mg Oral Tablet Take 4 Tablets (4 mg total) by mouth Once a day for 50 days, THEN 3 Tablets (3 mg total) Once a day for 50 days.    predniSONE (DELTASONE) 5 mg Oral Tablet Take 1 Tablet (5 mg total) by mouth Once a day for 30 days    sarilumab (KEVZARA) 200 mg/1.14 mL Subcutaneous Pen Injector Inject 1 pen (200 mg) subcutaneously (under the skin) once every 14 days (Patient not taking: Reported on 12/02/2023)    tiZANidine (ZANAFLEX) 4 mg Oral Tablet TAKE 1 TABLET BY MOUTH THREE TIMES A DAY AS NEEDED FOR MUSCLE CRAMPS STRENGTH: 4 MG    trimethoprim (TRIMPEX) 100 mg Oral Tablet Take 1 Tablet (100 mg total) by mouth Once a day     TRULICITY 0.75 mg/0.5 mL Subcutaneous Pen Injector INJECT 0.5 ML SUBCUTANEOUSLY  EVERY 7 DAYS       Examination:  BP (!) 148/77   Pulse 85   Temp 36.6 C (97.8 F) (Temporal)   Resp 16   Ht 1.651 m (5\' 5" )   Wt 97.7 kg (215 lb 6.2 oz)   SpO2 94%   BMI 35.84 kg/m  Wt Readings from Last 3 Encounters:   12/11/23 97.7 kg (215 lb 6.2 oz)   12/02/23 98.7 kg (217 lb 9.5 oz)   11/18/23 98.8 kg (217 lb 12.8 oz)       General: Resting comfortably, no acute distress.  Eyes: Conjunctiva normal, sclera non-icteric.  HENT: Atraumatic and normocephalic.   Neck:  Supple.   Lungs: Breathing comfortably.  No respiratory distress.  Abdomen: soft lax, not tender   Extremities: No cyanosis or edema  Neurologic: Alert, Oriented, Grossly normal  Psychiatric: Appears normal    Data/Chart/Labs/Imaging reviewed:  -CBC in April 2023 with WBC 9.2, hemoglobin 13.1, platelet 219  -BMP in April 2023 with creatinine 1.44, otherwise unremarkable  -liver panel in December 2022 normal    -vitamin-D in April 2023 was 41.5  -A1c in March 2023 was 5.9  -EGD in 2020 showed esophageal stricture, dilated distal middle and proximal esophagus, gastritis, lipoma of the 2nd part of duodenum, no pathology available, report not mentionening  the size of dilatation  -colonoscopy in 2018 with diverticulosis, biopsy taken to rule out microscopic colitis, no pathology available    -CT abdomen pelvis November 2023 with IV contrast showed no acute findings.      Fluoro esophagram barium swallow 04/25/2022:  Small hiatal hernia, with Schatzki ring measuring 1.0 cm in diameter.    EUS 05/10/2022:  Hypoechoic lesion 2nd portion of the duodenum.  This is in the submucosa, but, focally there is invasion of the muscularis propriae.  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  Biopsied.  Pathology:  Duodenal nodule.  Satisfactory for evaluation.  Negative for malignant cells.    EGD on 06/05/2022:  Normal upper 3rd of esophagus.  Normal middle 3rd of esophagus.  Normal lower 3rd of  esophagus.  Low-grade or narrowing Schatzki ring.  Dilated from 18-19 mm with significant resistance at 19 mm.  Z-line regular, 34 cm from incisors.  No esophagitis.  3 cm hiatal hernia.  Erythematous mucosa in the antrum.  No specimens collected.  Hill grade 2 hiatal hernia.  Normal duodenal bulb.  Duodenal subepithelial lesion.  This was evaluated by EUS pending CT abdomen scheduled 06/18/2022.    EGD October 2023 showed normal upper middle esophagus, normal lower 3rd of esophagus, regular Z-line at 34 cm no esophagitis.  Low-grade narrowing of Schatzki's ring dilated from 18 mm to 20 mm with mild resistance at 20 mm, the ring was also instructed using cold forceps cm hiatal hernia Hill grade 2.  Bile in the stomach was aspirated, normal gastric body and antrum, normal duodenal bulb, submucosal nodule in the 2nd part of the duodenum    EUS FNA April 15, 2023 for duodenal submucosal lesion showed 18 x 14 mm submucosal lesion in the 2nd portion of duodenum this is smaller than previously measured lesion biopsies were taken    Satisfactory for evaluation.  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    Comments    Immunostains for CK AE1/AE3, smooth muscle actin, S100, CD117, DOG 1, and SOX 10 have been performed, supporting the above diagnosis.  The differential diagnosis includes peripheral nerve sheath tumor, ganglioneuroma, and mucosal Schwann cell hamartoma.           _________________________________________________________________________  Assessment and Plan  CHEN MINSER is a 77 y.o. female with the following issues:    Dysphagia to solid and liquid improved s/p dilation.  EGD on 05/2022 showed Schatzki ring.  Repeated EGD October 2023 with dilatation of Schatzki's ring from 18 mm to 20 mm, patient denies current dysphagia  3 cm hiatal hernia, Hill grade  Patient has recent  worsening bloating and belching likely secondary to hiatal hernia, symptoms improved by increasing omeprazole 20 mg p.o. b.i.d.  Gastroesophageal reflux disease without esophagitis currently controlled with omeprazole 20 mg p.o. b.i.d.  Chronic iron-deficiency anemia, denies any overt GI bleeding celiac disease screening unremarkable, denies any NSAIDs use  History of colon polyps long time ago around 7 years ago, due for surveillance.   History of postprandial diarrhea, history of cholecystectomy, possible bile salt diarrhea, partial improvement with colestipol.  Patient stopped taking cholestyramine due to constipation and the taste of the medication.  Currently denies any diarrhea, taking Metamucil b.i.d.  Colonic diverticulosis without complication  Medium-sized subepithelial lesion with no bleeding was found in the 2nd portion of the duodenum.  This was recently evaluated by EUS which was negative for malignant cells.    CT abdomen pelvis November 2023 showed no acute findings.  Repeated EUS April 2024 showed 18 x 14 mm submucosal lesions FNA showed hamartomatous neural origin tumor, benign    My recommendations are as follows:    -recommended patient to complete colonoscopy for iron-deficiency anemia and history of colon polyps, she would like to have her colonoscopy with local GI doctor as she lives 2-3 hours from here, recommended to be completed  -will continue oral iron daily for 3 months  -check CBC, iron studies, ordered  -unremarkable B12 and folic acid May 2024  -CT abdomen pelvis November 2023 was unremarkable for acute findings  -celiac disease screening unremarkable  -patient currently not taking cholestyramine or colestipol, denies any diarrhea  --Avoid NSAIDs  -Anti-reflux measures discussed with patient.   -Avoid triggering foods  -Elevate head of bed at night or use more pillows  -Avoid eating 2-3 hours prior to bedtime   -continue omeprazole 20 mg p.o. once to twice daily.  Take it 30 minutes  before breakfast.  -Recommend weight loss of at least 10 % of current body weight   -Small bites, Small sips,  Swallow each bite/sip before taking next swallow and Remain upright after meals for  30 minutes after eating, slow rate of intake. Recommend to take his medication with  puree or pudding consistency if having difficulty swallowing medications   -Recommended to eat a high-fiber diet with fresh fruits and vegetables.  Suggested to increase fluid intake and to drink more water when eating more fiber.  Also suggested regular exercise.  -recommend Metamucil BID  -will give refill for omeprazole    The patient was given the opportunity to ask questions and those questions were appropriately answered. The patient agreed with the treatment plan and is encouraged to call with any additional questions or concerns.       Return to the clinic in 1 year or sooner as needed        Joslyn Devon, MD, MPH   [facility] - Gastroenterology  [place], [place]  [address]          Note: A portion of this documentation was generated using MMODAL (voice recognition software) and may contain syntax/voice recognition errors.

## 2023-12-12 ENCOUNTER — Other Ambulatory Visit (INDEPENDENT_AMBULATORY_CARE_PROVIDER_SITE_OTHER): Payer: Self-pay | Admitting: Family Medicine

## 2023-12-12 ENCOUNTER — Encounter (INDEPENDENT_AMBULATORY_CARE_PROVIDER_SITE_OTHER): Payer: Self-pay | Admitting: Emergency Medicine

## 2023-12-12 DIAGNOSIS — K219 Gastro-esophageal reflux disease without esophagitis: Secondary | ICD-10-CM

## 2023-12-12 MED ORDER — OMEPRAZOLE 40 MG CAPSULE,DELAYED RELEASE
40.0000 mg | DELAYED_RELEASE_CAPSULE | Freq: Every day | ORAL | 1 refills | Status: DC
Start: 2023-12-12 — End: 2024-05-24

## 2023-12-22 ENCOUNTER — Encounter (HOSPITAL_BASED_OUTPATIENT_CLINIC_OR_DEPARTMENT_OTHER): Payer: Self-pay | Admitting: Student in an Organized Health Care Education/Training Program

## 2023-12-22 ENCOUNTER — Ambulatory Visit (INDEPENDENT_AMBULATORY_CARE_PROVIDER_SITE_OTHER): Payer: Self-pay | Admitting: Family Medicine

## 2023-12-22 DIAGNOSIS — F419 Anxiety disorder, unspecified: Secondary | ICD-10-CM

## 2023-12-22 MED ORDER — DIAZEPAM 5 MG TABLET
5.0000 mg | ORAL_TABLET | Freq: Once | ORAL | 0 refills | Status: DC | PRN
Start: 2023-12-22 — End: 2024-01-28

## 2023-12-22 NOTE — Telephone Encounter (Signed)
Patient stopped by front desk and wanted to know if PCP could prescribe 1 pill for anxiety the morning of Jan. 07 for a shot in her back. Patient would like medication called [facility].  Hal Morales, [place] 12/22/2023 11:15

## 2023-12-22 NOTE — Telephone Encounter (Signed)
Order has been placed for requested prescription. Thank you.

## 2023-12-26 ENCOUNTER — Ambulatory Visit (INDEPENDENT_AMBULATORY_CARE_PROVIDER_SITE_OTHER): Payer: Medicare PPO

## 2023-12-31 ENCOUNTER — Telehealth (HOSPITAL_BASED_OUTPATIENT_CLINIC_OR_DEPARTMENT_OTHER): Payer: Self-pay

## 2023-12-31 NOTE — Telephone Encounter (Signed)
Called patient to reschedule L4/5, L5/S1 BIL MBB(#1 12/30/23, #2 01/13/24) RFA(L 01/27/24, R 02/10/24) to (#1 /21/25, #2 01/27/24) RFA(L 02/10/24, R 02/24/24) all at 1145 arrive at 10 due to weather cancellations. Follow up 03/15/24 at 130 with [facility] in [place].   Spoke with her son.       Otilio Connors

## 2024-01-02 ENCOUNTER — Telehealth (HOSPITAL_BASED_OUTPATIENT_CLINIC_OR_DEPARTMENT_OTHER): Payer: Self-pay | Admitting: Emergency Medicine

## 2024-01-02 NOTE — Telephone Encounter (Addendum)
Patient's son called to reschedule L4/5, L5/S1 BIL MBB(#1 01/13/24, #2 01/27/24) RFA(L 02/10/24, R 02/24/24) to (#1 01/27/24, #2 02/10/24) RFA(L 02/24/24, R 03/09/24) all at 1145 arrive at 10, follow up 03/29/24 at 130 with [facility].       Otilio Connors

## 2024-01-03 ENCOUNTER — Other Ambulatory Visit (INDEPENDENT_AMBULATORY_CARE_PROVIDER_SITE_OTHER): Payer: Self-pay | Admitting: Family Medicine

## 2024-01-03 DIAGNOSIS — E1159 Type 2 diabetes mellitus with other circulatory complications: Secondary | ICD-10-CM

## 2024-01-06 ENCOUNTER — Other Ambulatory Visit (INDEPENDENT_AMBULATORY_CARE_PROVIDER_SITE_OTHER): Payer: Self-pay | Admitting: Student in an Organized Health Care Education/Training Program

## 2024-01-06 DIAGNOSIS — M353 Polymyalgia rheumatica: Secondary | ICD-10-CM

## 2024-01-06 NOTE — Telephone Encounter (Signed)
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  Labs were completed on 08/18/2023  and results are listed below.  The next scheduled appointment date is 03/02/2024.  [facility] [place] [facility].  Message has been routed to Dr.Wriston.    Lab Results   Component Value Date/Time    WBC 9.4 08/18/2023 09:16 AM    HGB 10.9 (L) 08/18/2023 09:16 AM    HCT 33.8 (L) 08/18/2023 09:16 AM    PLTCNT 424 08/18/2023 09:16 AM    RBC 3.76 (L) 08/18/2023 09:16 AM    MCV 89.8 08/18/2023 09:16 AM    MCHC 32.1 08/18/2023 09:16 AM    MCH 28.9 08/18/2023 09:16 AM    RDW 18.6 (H) 08/18/2023 09:16 AM    MPV 8.2 08/18/2023 09:16 AM      Lab Results   Component Value Date    CREATININE 1.87 (H) 08/18/2023      Hepatic Function  Lab Results   Component Value Date    ALBUMIN 3.3 (L) 09/17/2023    TOTALPROTEIN 7.4 09/17/2023    ALKPHOS 90 08/18/2023    PROTHROMTME 13.5 (H) 10/29/2022    INR 1.19 10/29/2022    AST 14 08/18/2023    ALT 12 08/18/2023    BILIRUBINCON 0.3 10/29/2022       Kalman Drape, LPN  1/61/0960,  13:50

## 2024-01-09 ENCOUNTER — Ambulatory Visit (INDEPENDENT_AMBULATORY_CARE_PROVIDER_SITE_OTHER)
Admission: RE | Admit: 2024-01-09 | Discharge: 2024-01-09 | Disposition: A | Discharge status: Home / Self Care | Payer: Medicare PPO | Source: Ambulatory Visit | Attending: Family Medicine | Admitting: Family Medicine

## 2024-01-09 ENCOUNTER — Ambulatory Visit
Admission: RE | Admit: 2024-01-09 | Discharge: 2024-01-09 | Disposition: A | Discharge status: Home / Self Care | Payer: Medicare PPO | Source: Ambulatory Visit | Attending: Family Medicine | Admitting: Family Medicine

## 2024-01-09 ENCOUNTER — Other Ambulatory Visit: Payer: Self-pay

## 2024-01-09 DIAGNOSIS — R0609 Other forms of dyspnea: Secondary | ICD-10-CM | POA: Insufficient documentation

## 2024-01-09 MED ORDER — SODIUM CHLORIDE 0.9 % (FLUSH) INJECTION SYRINGE
10.0000 mL | INJECTION | INTRAMUSCULAR | Status: AC
Start: 2024-01-09 — End: 2024-01-09
  Administered 2024-01-09: 30 mL via INTRAVENOUS

## 2024-01-09 MED ORDER — REGADENOSON 0.4 MG/5 ML INTRAVENOUS SYRINGE
0.4000 mg | INJECTION | Freq: Once | INTRAVENOUS | Status: AC | PRN
Start: 2024-01-09 — End: 2024-01-09
  Administered 2024-01-09: 0.4 mg via INTRAVENOUS

## 2024-01-12 ENCOUNTER — Ambulatory Visit (HOSPITAL_COMMUNITY): Payer: Self-pay | Admitting: Physician Assistant

## 2024-01-12 LAB — MYOCARDIAL PERFUSION COMPLETE: Nuc Stress EF: 63 %

## 2024-01-13 ENCOUNTER — Other Ambulatory Visit (HOSPITAL_BASED_OUTPATIENT_CLINIC_OR_DEPARTMENT_OTHER): Payer: Self-pay | Admitting: Student in an Organized Health Care Education/Training Program

## 2024-01-13 DIAGNOSIS — K222 Esophageal obstruction: Secondary | ICD-10-CM

## 2024-01-13 DIAGNOSIS — R1310 Dysphagia, unspecified: Secondary | ICD-10-CM

## 2024-01-20 ENCOUNTER — Telehealth (HOSPITAL_BASED_OUTPATIENT_CLINIC_OR_DEPARTMENT_OTHER): Payer: Self-pay | Admitting: Student in an Organized Health Care Education/Training Program

## 2024-01-20 NOTE — Telephone Encounter (Signed)
Dr. Lind Covert ordered an EGD for pt.    Pt is sched on 02/10/2024 .  Pt must be NPO after midnight.     -If they have any questions, they may feel free to contact us to discuss any questions they may have.    -The day before procedure, PAT will call to confirm the exact time.  Times may change pending on the sx sched that day.    -Day of procedure pt aware they need to arrive  1.25 hours early on 2nd floor OPS with a driver.    -Pt will not be able to drive for 24 hrs.    -Doc will speak to the pt before and after procedure.    -Clearance needed:   No    Instructions/education mailed: yes  Address confirmed in chart:  No    -Additional comments:

## 2024-01-22 ENCOUNTER — Other Ambulatory Visit (INDEPENDENT_AMBULATORY_CARE_PROVIDER_SITE_OTHER): Payer: Self-pay | Admitting: Family Medicine

## 2024-01-27 ENCOUNTER — Ambulatory Visit (HOSPITAL_COMMUNITY): Payer: Self-pay | Admitting: Internal Medicine

## 2024-01-27 ENCOUNTER — Inpatient Hospital Stay (HOSPITAL_COMMUNITY)
Admission: RE | Admit: 2024-01-27 | Discharge: 2024-01-27 | Disposition: A | Discharge status: Home / Self Care | Payer: Medicare PPO | Source: Ambulatory Visit

## 2024-01-27 ENCOUNTER — Other Ambulatory Visit: Payer: Self-pay

## 2024-01-27 ENCOUNTER — Encounter (HOSPITAL_COMMUNITY): Payer: Self-pay | Admitting: Emergency Medicine

## 2024-01-27 ENCOUNTER — Ambulatory Visit
Admission: RE | Admit: 2024-01-27 | Discharge: 2024-01-27 | Disposition: A | Discharge status: Home / Self Care | Payer: Medicare PPO | Source: Ambulatory Visit | Attending: Emergency Medicine | Admitting: Emergency Medicine

## 2024-01-27 ENCOUNTER — Other Ambulatory Visit (HOSPITAL_BASED_OUTPATIENT_CLINIC_OR_DEPARTMENT_OTHER): Payer: Self-pay | Admitting: Emergency Medicine

## 2024-01-27 ENCOUNTER — Ambulatory Visit (HOSPITAL_COMMUNITY): Payer: Medicare PPO | Admitting: Physician Assistant

## 2024-01-27 ENCOUNTER — Encounter (HOSPITAL_COMMUNITY)
Admission: RE | Disposition: A | Discharge status: Home / Self Care | Payer: Self-pay | Source: Ambulatory Visit | Attending: Emergency Medicine

## 2024-01-27 DIAGNOSIS — R52 Pain, unspecified: Secondary | ICD-10-CM

## 2024-01-27 DIAGNOSIS — M47816 Spondylosis without myelopathy or radiculopathy, lumbar region: Secondary | ICD-10-CM

## 2024-01-27 SURGERY — PAIN SERVICE BLOCK INJECTION FACET MEDIAL BRANCH LUMBAR/THORACIC
Anesthesia: Local (Nurse-Monitored) | Laterality: Bilateral | Wound class: Clean Wound: Uninfected operative wounds in which no inflammation occurred

## 2024-01-27 MED ORDER — BUPIVACAINE (PF) 0.5 % (5 MG/ML) INJECTION SOLUTION
Freq: Once | INTRAMUSCULAR | Status: DC | PRN
Start: 2024-01-27 — End: 2024-01-27
  Administered 2024-01-27: 30 mL via INTRAMUSCULAR

## 2024-01-27 SURGICAL SUPPLY — 7 items
APPL ISPRP CHG 10.5ML CHLRPRP HI-LT ORNG PREP STRL LF (MED SURG SUPPLIES) ×1 IMPLANT
GLOVE SURG 8 LF  PF SMOOTH BEAD CUF STRL CRM 11.8IN PROTEXIS PI MICRO PLISPRN THK7.9 MIL MICRO (GLOVES AND ACCESSORIES) ×1 IMPLANT
NEEDLE HYPO  30GA 1IN REG WL PRCSNGL POLYPROP REG BVL LL HUB DEHP-FR TAN STRL LF  DISP (MED SURG SUPPLIES) ×1 IMPLANT
NEEDLE SPINAL CAROLINA BLU 2.5IN 25GA QUINCKE FN METAL PLASTIC STY REM WNG STRL LF  DISP (MED SURG SUPPLIES) IMPLANT
NEEDLE SPINAL CAROLINA BLU 3.5IN 25GA QUINCKE FN METAL PLASTIC STY REM WNG STRL LF  DISP (MED SURG SUPPLIES) ×2 IMPLANT
SYRINGE LL 3ML LF  STRL GRAD N-PYRG DEHP-FR PVC FREE MED DISP CLR (MED SURG SUPPLIES) ×1 IMPLANT
TRAY UNIV NERVE BLOCK DISP SNG USE LF STRL (MED SURG SUPPLIES) ×1 IMPLANT

## 2024-01-27 NOTE — Nurses Notes (Signed)
[place] Pain Rating Scale     On a scale of 0-10, during the past 24 hours, pain has interfered with you usual activity: 8     On a scale of 0-10, during the past 24 hours, pain has interfered with your sleep: 0    On a scale of 0-10, during the past 24 hours, pain has affected your mood: 6     On a scale of 0-10, during the past 24 hours, pain has contributed to your stress: 8     On a scale of 0-10, what is your overall pain Rating: 4

## 2024-01-27 NOTE — OR Surgeon (Signed)
[facility]  [address]  [place] [address]  [phone]        PATIENT NAME: Joanna Black   CHART WGNFAO:[id number]  DATE OF BIRTH: 09-29-1946  DATE OF SERVICE:   01/27/2024  SITE OF SERVICE:  [facility] hospital      PREOPERATIVE DIAGNOSIS:   Patient Active Problem List   Diagnosis    Type 2 diabetes mellitus with stage 3a chronic kidney disease, without long-term current use of insulin (CMS HCC)    Cervical pain (neck)    Thoracic back pain    Chronic bilateral low back pain    Essential hypertension    Cardiomyopathy, dilated, nonischemic (CMS HCC)    Chronic GERD    Chronic systolic congestive heart failure (CMS HCC)    BMI 35.0-35.9,adult    Mixed hyperlipidemia    CKD (chronic kidney disease) stage 3, 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/min (CMS HCC)    History of colon polyps    Colon cancer screening    Gastroesophageal reflux disease    Dysphagia    Schatzki's ring    Chronic diarrhea    Diverticulosis of colon    Hypoxia    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    TIMEOUT:  Correct patient? Yes  Correct site? Yes  Correct side? Yes  Correct position? Yes  Correct procedure? Yes  Correct medication? Yes  Site marked? Yes  H&P note completed? Yes  Consents verified? Yes  Relevant lab results available? Yes  Allergies reviewed? Yes  Is all required equipment for the procedure available? Yes  Is documentation  verified? Yes  Is Time Out verified by doctor and nurse? Yes    The skin was prepped and draped in the usual sterile fashion using chlorhexidine.  Anatomical landmarks were identified with the aid of fluoroscopy in PA and Oblique views, and the skin and subcutaneous tissue were infiltrated with a total of 5mL of 1% Lidocaine via a 25-gauge 1.5inch needle at each of the intended entry sites.  A 25ga Quincke tip needle was then advanced toward above mentioned Medial Branch with the aid of fluoroscopy such that the tip was located at the respective positions of the superior medial border of the transverse processes with the posterior elements at each level.  Needle placement was confirmed with the aid of fluoroscopy.  After negative aspiration, 0.53mL of  0.5% Bupivacaine were injected at each level. The procedure waswell tolerated and all needles were rinsed with 1% lidocaine and removed intact.    Patient was observed in recovery and discharged home with written instruction under supervision in stable condition.    Forbes Cellar, DO 01/27/2024, 10:20    Division of Pain Medicine

## 2024-01-27 NOTE — OR PostOp (Signed)
90% relief reported by patient post procedure.  Orlene Och, LPN  01/27/9562, 87:56    [facility] OVERALL PAIN RATING POST PROCEDURE  2 of 10

## 2024-01-27 NOTE — Discharge Instructions (Signed)
 [facility]  AT [facility] - [place], [place] [address] - Phone: [phone])      Complications (Reasons to call):  Temperature greater than 101 degrees  Unusual redness or swelling at the injection site  Persistent nausea or vomiting or headache  Persistent weakness, numbness or bleeding  Loss of bowel or bladder control  If you are unable to reach your physician and your symptoms are severe, have yourself brought to the nearest Emergency Department or call 911    You may experience:  You may also experience a temporary increase in the level of your pain  You may experience weakness, tingling or heavy feelings in your legs the first few hours after your procedure, requiring you to be cautious when ambulating (walking).   Do NOT drive a car or operate heavy machinery for 24 hours after your procedure    Medications:  Most medications held for your procedure may be resumed one day after the procedure. Please ask your physician if you have questions about specific medications or the procedure itself.    Comfort measures:  You may use ice over the injection site  AVOID HEAT for the first 24 hours  After that ice or heat may be used as needed. DO NOT apply LONGER than 20 minutes and wait 20 minutes before reapplication  Avoid sitting in a bathtub, hot tub, pool, etc. for 3 days after your procedure    Activity:  Rest at home for the next 24 hours  Then increase activity as tolerated  Typically it is ok to return to work one day after the procedure

## 2024-01-27 NOTE — OR Nursing (Signed)
 Meds on field, see MD notes for amounts used.

## 2024-01-28 ENCOUNTER — Telehealth (HOSPITAL_COMMUNITY): Payer: Self-pay | Admitting: Emergency Medicine

## 2024-01-28 ENCOUNTER — Telehealth (INDEPENDENT_AMBULATORY_CARE_PROVIDER_SITE_OTHER): Payer: Self-pay | Admitting: Emergency Medicine

## 2024-01-28 ENCOUNTER — Other Ambulatory Visit (INDEPENDENT_AMBULATORY_CARE_PROVIDER_SITE_OTHER): Payer: Self-pay | Admitting: Family Medicine

## 2024-01-28 DIAGNOSIS — F419 Anxiety disorder, unspecified: Secondary | ICD-10-CM

## 2024-01-28 MED ORDER — NAPROXEN 500 MG TABLET
500.0000 mg | ORAL_TABLET | Freq: Two times a day (BID) | ORAL | 3 refills | Status: DC
Start: 2024-01-28 — End: 2024-04-13

## 2024-01-28 MED ORDER — DIAZEPAM 5 MG TABLET
5.0000 mg | ORAL_TABLET | Freq: Once | ORAL | 0 refills | Status: AC | PRN
Start: 2024-01-28 — End: ?

## 2024-01-28 NOTE — Telephone Encounter (Signed)
Called to see how patient was doing after their BIL LMBB 1ST SET injection on 11/25/24 with Dr. Allyson Sabal, and what percent of relief they had. No answer, left message to return our call  Arvid Right, [place]

## 2024-01-28 NOTE — Telephone Encounter (Signed)
Spoke with patient regarding L4/5, L5/S1 BIL MBB(#1 01/27/24) Patient states 90% X 12 hours. Rates pain 1/10.     CURRENT PAIN SCORE:5/10.       Joanna Black

## 2024-01-29 ENCOUNTER — Ambulatory Visit (INDEPENDENT_AMBULATORY_CARE_PROVIDER_SITE_OTHER): Payer: Self-pay | Admitting: Family Medicine

## 2024-01-29 ENCOUNTER — Telehealth (HOSPITAL_BASED_OUTPATIENT_CLINIC_OR_DEPARTMENT_OTHER): Payer: Self-pay | Admitting: Student in an Organized Health Care Education/Training Program

## 2024-01-29 NOTE — Telephone Encounter (Signed)
R/s'd pt's EGD to:  06/02/2024 .       Instructions reviewed with pt:  yes  Instructions/education mailed again: yes  Address confirmed in chart:  Yes  Reminder letter mailed to pt:  Yes  Office follow up r/s:  yes    Sx sched notified:  Yes - staff message  Packet moved to new day:  yes  Pt cx on original date in EPIC:  yes  Pt placed on new sched date:  yes    Additional comments: rescheduling due to conflicting appts.

## 2024-02-04 ENCOUNTER — Other Ambulatory Visit (INDEPENDENT_AMBULATORY_CARE_PROVIDER_SITE_OTHER): Payer: Self-pay | Admitting: Family Medicine

## 2024-02-04 DIAGNOSIS — E785 Hyperlipidemia, unspecified: Secondary | ICD-10-CM

## 2024-02-04 DIAGNOSIS — M62838 Other muscle spasm: Secondary | ICD-10-CM

## 2024-02-09 ENCOUNTER — Other Ambulatory Visit (HOSPITAL_BASED_OUTPATIENT_CLINIC_OR_DEPARTMENT_OTHER): Payer: Self-pay | Admitting: Nephrology

## 2024-02-09 DIAGNOSIS — N183 Chronic kidney disease, stage 3 unspecified (CMS HCC): Secondary | ICD-10-CM

## 2024-02-09 DIAGNOSIS — E1122 Type 2 diabetes mellitus with diabetic chronic kidney disease: Secondary | ICD-10-CM

## 2024-02-09 DIAGNOSIS — I10 Essential (primary) hypertension: Secondary | ICD-10-CM

## 2024-02-10 ENCOUNTER — Inpatient Hospital Stay (HOSPITAL_COMMUNITY)
Admission: RE | Admit: 2024-02-10 | Discharge: 2024-02-10 | Disposition: A | Discharge status: Home / Self Care | Payer: Medicare PPO | Source: Ambulatory Visit

## 2024-02-10 ENCOUNTER — Encounter (HOSPITAL_COMMUNITY)
Admission: RE | Disposition: A | Discharge status: Home / Self Care | Payer: Self-pay | Source: Ambulatory Visit | Attending: Emergency Medicine

## 2024-02-10 ENCOUNTER — Other Ambulatory Visit: Payer: Self-pay

## 2024-02-10 ENCOUNTER — Encounter (HOSPITAL_COMMUNITY): Payer: Self-pay | Admitting: Emergency Medicine

## 2024-02-10 ENCOUNTER — Ambulatory Visit
Admission: RE | Admit: 2024-02-10 | Discharge: 2024-02-10 | Disposition: A | Discharge status: Home / Self Care | Payer: Medicare PPO | Source: Ambulatory Visit | Attending: Emergency Medicine | Admitting: Emergency Medicine

## 2024-02-10 ENCOUNTER — Other Ambulatory Visit (HOSPITAL_BASED_OUTPATIENT_CLINIC_OR_DEPARTMENT_OTHER): Payer: Self-pay | Admitting: Emergency Medicine

## 2024-02-10 DIAGNOSIS — I5022 Chronic systolic (congestive) heart failure: Secondary | ICD-10-CM | POA: Insufficient documentation

## 2024-02-10 DIAGNOSIS — I110 Hypertensive heart disease with heart failure: Secondary | ICD-10-CM | POA: Insufficient documentation

## 2024-02-10 DIAGNOSIS — R52 Pain, unspecified: Secondary | ICD-10-CM

## 2024-02-10 DIAGNOSIS — M47816 Spondylosis without myelopathy or radiculopathy, lumbar region: Secondary | ICD-10-CM

## 2024-02-10 DIAGNOSIS — E1122 Type 2 diabetes mellitus with diabetic chronic kidney disease: Secondary | ICD-10-CM | POA: Insufficient documentation

## 2024-02-10 DIAGNOSIS — I130 Hypertensive heart and chronic kidney disease with heart failure and stage 1 through stage 4 chronic kidney disease, or unspecified chronic kidney disease: Secondary | ICD-10-CM | POA: Insufficient documentation

## 2024-02-10 DIAGNOSIS — N183 Chronic kidney disease, stage 3 unspecified: Secondary | ICD-10-CM | POA: Insufficient documentation

## 2024-02-10 LAB — POC BLOOD GLUCOSE (RESULTS): GLUCOSE, POC: 116 mg/dL — ABNORMAL HIGH (ref 60–110)

## 2024-02-10 SURGERY — PAIN SERVICE BLOCK INJECTION FACET MEDIAL BRANCH LUMBAR/THORACIC
Anesthesia: Local (Nurse-Monitored) | Laterality: Bilateral | Wound class: Clean Wound: Uninfected operative wounds in which no inflammation occurred

## 2024-02-10 MED ORDER — BUPIVACAINE (PF) 0.5 % (5 MG/ML) INJECTION SOLUTION
Freq: Once | INTRAMUSCULAR | Status: DC | PRN
Start: 2024-02-10 — End: 2024-02-10
  Administered 2024-02-10: 30 mL via INTRAMUSCULAR

## 2024-02-10 SURGICAL SUPPLY — 6 items
APPL ISPRP CHG 10.5ML CHLRPRP HI-LT ORNG PREP STRL LF (MED SURG SUPPLIES) ×1 IMPLANT
GLOVE SURG 8 LF  PF SMOOTH BEAD CUF STRL CRM 11.8IN PROTEXIS PI MICRO PLISPRN THK7.9 MIL MICRO (GLOVES AND ACCESSORIES) ×1 IMPLANT
NEEDLE HYPO  30GA 1IN REG WL PRCSNGL POLYPROP REG BVL LL HUB DEHP-FR TAN STRL LF  DISP (MED SURG SUPPLIES) ×1 IMPLANT
NEEDLE SPINAL CAROLINA BLU 3.5IN 25GA QUINCKE FN METAL PLASTIC STY REM WNG STRL LF  DISP (MED SURG SUPPLIES) ×2 IMPLANT
SYRINGE LL 3ML LF  STRL GRAD N-PYRG DEHP-FR PVC FREE MED DISP CLR (MED SURG SUPPLIES) ×1 IMPLANT
TRAY UNIV NERVE BLOCK DISP SNG USE LF STRL (MED SURG SUPPLIES) ×1 IMPLANT

## 2024-02-10 NOTE — OR PostOp (Signed)
DVPRS OVERALL PAIN RATING POST PROCEDURE   2/10    80% relief reported by patient post procedure.  Pollyann Samples, RN  02/10/2024, 11:26

## 2024-02-10 NOTE — OR Surgeon (Signed)
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)    Cervical pain (neck)    Thoracic back pain    Chronic bilateral low back pain    Essential hypertension    Cardiomyopathy, dilated, nonischemic (CMS HCC)    Chronic GERD    Chronic systolic congestive heart failure (CMS HCC)    BMI 35.0-35.9,adult    Mixed hyperlipidemia    CKD (chronic kidney disease) stage 3, 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/min (CMS HCC)    History of colon polyps    Colon cancer screening    Gastroesophageal reflux disease    Dysphagia    Schatzki's ring    Chronic diarrhea    Diverticulosis of colon    Hypoxia    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    TIMEOUT:  Correct patient? Yes  Correct site? Yes  Correct side? Yes  Correct position? Yes  Correct procedure? Yes  Correct medication? Yes  Site marked? Yes  H&P note completed? Yes  Consents verified? Yes  Relevant lab results available? Yes  Allergies reviewed? Yes  Is all required equipment for the procedure available? Yes  Is documentation  verified? Yes  Is Time Out verified by doctor and nurse? Yes    The skin was prepped and draped in the usual sterile fashion using chlorhexidine.  Anatomical landmarks were identified with the aid of fluoroscopy in PA and Oblique views, and the skin and subcutaneous tissue were infiltrated with a total of 5mL of 1% Lidocaine via a 25-gauge 1.5inch needle at each of the intended entry sites.  A 25ga Quincke tip needle was then advanced toward above mentioned Medial Branch with the aid of fluoroscopy such that the tip was located at the respective positions of the superior medial border of the transverse processes with the posterior elements at each level.  Needle placement was confirmed with the aid of fluoroscopy.  After negative aspiration, 0.19mL of  0.5% Bupivacaine were injected at each level. The procedure waswell tolerated and all needles were rinsed with 1% lidocaine and removed intact.    Patient was observed in recovery and discharged home with written instruction under supervision in stable condition.    Forbes Cellar, DO 02/10/2024, 10:11    Division of Pain Medicine

## 2024-02-10 NOTE — OR PostOp (Signed)
 Patient discharged home without distress via wheelchair. Patient's husband is driving her home.

## 2024-02-10 NOTE — OR Nursing (Signed)
 Medications administered to sterile field. See MD notes for amounts administered.

## 2024-02-10 NOTE — OR Nursing (Signed)
 [place] Pain Rating Scale     On a scale of 0-10, during the past 24 hours, pain has interfered with you usual activity: 8     On a scale of 0-10, during the past 24 hours, pain has interfered with your sleep: 0    On a scale of 0-10, during the past 24 hours, pain has affected your mood: 6     On a scale of 0-10, during the past 24 hours, pain has contributed to your stress: 6     On a scale of 0-10, what is your overall pain Rating: 5

## 2024-02-10 NOTE — Discharge Instructions (Signed)
 [facility]  AT [facility] - [place], [place] [address] - Phone: [phone])      Complications (Reasons to call):  Temperature greater than 101 degrees  Unusual redness or swelling at the injection site  Persistent nausea or vomiting or headache  Persistent weakness, numbness or bleeding  Loss of bowel or bladder control  If you are unable to reach your physician and your symptoms are severe, have yourself brought to the nearest Emergency Department or call 911    You may experience:  You may also experience a temporary increase in the level of your pain  You may experience weakness, tingling or heavy feelings in your legs the first few hours after your procedure, requiring you to be cautious when ambulating (walking).   Do NOT drive a car or operate heavy machinery for 24 hours after your procedure    Medications:  Most medications held for your procedure may be resumed one day after the procedure. Please ask your physician if you have questions about specific medications or the procedure itself.    Comfort measures:  You may use ice over the injection site  AVOID HEAT for the first 24 hours  After that ice or heat may be used as needed. DO NOT apply LONGER than 20 minutes and wait 20 minutes before reapplication  Avoid sitting in a bathtub, hot tub, pool, etc. for 3 days after your procedure    Activity:  Rest at home for the next 24 hours  Then increase activity as tolerated  Typically it is ok to return to work one day after the procedure

## 2024-02-11 ENCOUNTER — Telehealth (HOSPITAL_BASED_OUTPATIENT_CLINIC_OR_DEPARTMENT_OTHER): Payer: Self-pay | Admitting: Emergency Medicine

## 2024-02-11 ENCOUNTER — Telehealth (HOSPITAL_BASED_OUTPATIENT_CLINIC_OR_DEPARTMENT_OTHER): Payer: Self-pay | Admitting: Nephrology

## 2024-02-11 NOTE — Telephone Encounter (Signed)
Called to see how patient was doing after their BIL LMBB injection on 02/10/24 with Dr. Allyson Sabal, and what percent of relief they had. No answer, left message to return our call  Arvid Right, [place]

## 2024-02-12 ENCOUNTER — Telehealth (HOSPITAL_BASED_OUTPATIENT_CLINIC_OR_DEPARTMENT_OTHER): Payer: Self-pay | Admitting: Emergency Medicine

## 2024-02-12 ENCOUNTER — Telehealth (HOSPITAL_BASED_OUTPATIENT_CLINIC_OR_DEPARTMENT_OTHER): Payer: Self-pay | Admitting: Nephrology

## 2024-02-12 NOTE — Telephone Encounter (Signed)
Attempted to contact patient to remind of appt on 02/19/2024 and that labs need to be completed prior to appt. No answer but did leave a detailed message with call back number. Also advised in message we have relocated to [address].     York Cerise, MA

## 2024-02-12 NOTE — Telephone Encounter (Signed)
Spoke with pt regarding 2ND LET BIL LMBB on 02/10/24. Pt reports 80% X8 HOURS relief. Informed pt to contact us with any questions/concerns.    PAIN: 2/10    CURRENT: 5/10    Arvid Right, MA

## 2024-02-13 ENCOUNTER — Ambulatory Visit (HOSPITAL_COMMUNITY): Payer: Self-pay | Admitting: Internal Medicine

## 2024-02-16 ENCOUNTER — Telehealth (HOSPITAL_BASED_OUTPATIENT_CLINIC_OR_DEPARTMENT_OTHER): Payer: Self-pay | Admitting: Nephrology

## 2024-02-16 NOTE — Telephone Encounter (Signed)
 Spoke with patient's son Joanna Black to remind him that his mother has to get lab work completed prior to appointment with Dr. Melonie Florida on Thursday 02/19/24 and informed that we have relocated to [address].    Patient's son acknowledged.    Joanna Claude, MA

## 2024-02-17 ENCOUNTER — Ambulatory Visit (INDEPENDENT_AMBULATORY_CARE_PROVIDER_SITE_OTHER): Payer: Self-pay | Admitting: Family Medicine

## 2024-02-17 DIAGNOSIS — J069 Acute upper respiratory infection, unspecified: Secondary | ICD-10-CM

## 2024-02-17 MED ORDER — METHYLPREDNISOLONE 4 MG TABLETS IN A DOSE PACK
ORAL_TABLET | ORAL | 0 refills | Status: AC
Start: 2024-02-17 — End: ?

## 2024-02-17 MED ORDER — DOXYCYCLINE HYCLATE 100 MG CAPSULE
100.0000 mg | ORAL_CAPSULE | Freq: Two times a day (BID) | ORAL | 0 refills | Status: AC
Start: 2024-02-17 — End: 2024-02-27

## 2024-02-17 NOTE — Telephone Encounter (Signed)
 Called and informed patient of doctors notes, patient voiced understanding.  [place], [place] 02/17/2024 11:42

## 2024-02-17 NOTE — Telephone Encounter (Signed)
 Please make patient aware order placed for Abx. and steroid taper to pharmacy. Thanks.

## 2024-02-17 NOTE — Telephone Encounter (Signed)
 Patient called and left a message asking if PCP could call in an antibiotic or something else. She has been coughing up yellow phlegm, she is wheezing but does not have a cold.  Hal Morales, [place] 02/17/2024 11:15

## 2024-02-19 ENCOUNTER — Ambulatory Visit (HOSPITAL_BASED_OUTPATIENT_CLINIC_OR_DEPARTMENT_OTHER): Payer: Self-pay | Admitting: Nephrology

## 2024-02-20 ENCOUNTER — Emergency Department (HOSPITAL_COMMUNITY)

## 2024-02-20 ENCOUNTER — Other Ambulatory Visit (HOSPITAL_COMMUNITY)
Admit: 2024-02-20 | Discharge: 2024-02-20 | Disposition: A | Discharge status: Home / Self Care | Payer: Self-pay | Attending: Physician Assistant | Admitting: Physician Assistant

## 2024-02-20 ENCOUNTER — Other Ambulatory Visit: Payer: Self-pay

## 2024-02-20 ENCOUNTER — Other Ambulatory Visit (HOSPITAL_COMMUNITY): Payer: Self-pay | Admitting: Physician Assistant

## 2024-02-20 ENCOUNTER — Encounter (HOSPITAL_COMMUNITY): Payer: Self-pay

## 2024-02-20 ENCOUNTER — Ambulatory Visit (INDEPENDENT_AMBULATORY_CARE_PROVIDER_SITE_OTHER): Payer: Self-pay | Admitting: Family Medicine

## 2024-02-20 ENCOUNTER — Emergency Department
Admission: EM | Admit: 2024-02-20 | Discharge: 2024-02-20 | Disposition: A | Discharge status: Home / Self Care | Attending: Emergency Medicine | Admitting: Emergency Medicine

## 2024-02-20 DIAGNOSIS — R0602 Shortness of breath: Secondary | ICD-10-CM

## 2024-02-20 DIAGNOSIS — R9431 Abnormal electrocardiogram [ECG] [EKG]: Secondary | ICD-10-CM | POA: Insufficient documentation

## 2024-02-20 DIAGNOSIS — J45909 Unspecified asthma, uncomplicated: Secondary | ICD-10-CM

## 2024-02-20 DIAGNOSIS — J069 Acute upper respiratory infection, unspecified: Secondary | ICD-10-CM

## 2024-02-20 DIAGNOSIS — J45901 Unspecified asthma with (acute) exacerbation: Secondary | ICD-10-CM | POA: Insufficient documentation

## 2024-02-20 DIAGNOSIS — I447 Left bundle-branch block, unspecified: Secondary | ICD-10-CM | POA: Insufficient documentation

## 2024-02-20 LAB — CBC WITH DIFF
BASOPHIL #: <0.1 10*3/uL (ref <=0.20)
BASOPHIL %: 0.2 %
EOSINOPHIL #: <0.1 10*3/uL (ref <=0.50)
EOSINOPHIL %: 0.1 %
HCT: 42.8 % (ref 34.8–46.0)
HGB: 13.3 g/dL (ref 11.5–16.0)
IMMATURE GRANULOCYTE #: 0.23 10*3/uL — ABNORMAL HIGH (ref <0.10)
IMMATURE GRANULOCYTE %: 1.4 % — ABNORMAL HIGH (ref 0.0–1.0)
LYMPHOCYTE #: 0.69 10*3/uL — ABNORMAL LOW (ref 1.00–4.80)
LYMPHOCYTE %: 4.2 %
MCH: 29.5 pg (ref 26.0–32.0)
MCHC: 31.1 g/dL (ref 31.0–35.5)
MCV: 94.9 fL (ref 78.0–100.0)
MONOCYTE #: 0.66 10*3/uL (ref 0.20–1.10)
MONOCYTE %: 4.1 %
MPV: 11 fL (ref 8.7–12.5)
NEUTROPHIL #: 14.61 10*3/uL — ABNORMAL HIGH (ref 1.50–7.70)
NEUTROPHIL %: 90 %
PLATELETS: 333 10*3/uL (ref 150–400)
RBC: 4.51 10*6/uL (ref 3.85–5.22)
RDW-CV: 15.5 % (ref 11.5–15.5)
WBC: 16.2 10*3/uL — ABNORMAL HIGH (ref 3.7–11.0)

## 2024-02-20 LAB — BLOOD GAS
%FIO2 (VENOUS): 21 %
BASE EXCESS: 2.8 mmol/L (ref 0.0–3.0)
BICARBONATE (VENOUS): 25.5 mmol/L (ref 22.0–29.0)
O2 SATURATION (VENOUS): 39.7 % (ref 40.0–85.0)
PCO2 (VENOUS): 52 mmHg — ABNORMAL HIGH (ref 41–51)
PH (VENOUS): 7.36 (ref 7.32–7.43)
PO2 (VENOUS): 24 mmHg (ref 35–50)

## 2024-02-20 LAB — BASIC METABOLIC PANEL
ANION GAP: 11 mmol/L (ref 4–13)
BUN/CREA RATIO: 25 — ABNORMAL HIGH (ref 6–22)
BUN: 34 mg/dL — ABNORMAL HIGH (ref 8–25)
CALCIUM: 9.2 mg/dL (ref 8.6–10.3)
CHLORIDE: 102 mmol/L (ref 96–111)
CO2 TOTAL: 25 mmol/L (ref 23–31)
CREATININE: 1.36 mg/dL — ABNORMAL HIGH (ref 0.60–1.05)
ESTIMATED GFR - FEMALE: 40 mL/min/BSA — ABNORMAL LOW (ref >=60)
GLUCOSE: 191 mg/dL — ABNORMAL HIGH (ref 65–125)
POTASSIUM: 4.1 mmol/L (ref 3.5–5.1)
SODIUM: 138 mmol/L (ref 136–145)

## 2024-02-20 LAB — B-TYPE NATRIURETIC PEPTIDE (BNP),PLASMA: BNP: 212 pg/mL — ABNORMAL HIGH (ref <=99)

## 2024-02-20 MED ORDER — PREDNISONE 20 MG TABLET
40.0000 mg | ORAL_TABLET | ORAL | Status: AC
Start: 2024-02-20 — End: 2024-02-20
  Administered 2024-02-20: 40 mg via ORAL
  Filled 2024-02-20: qty 2

## 2024-02-20 MED ORDER — IPRATROPIUM 0.5 MG-ALBUTEROL 3 MG (2.5 MG BASE)/3 ML NEBULIZATION SOLN
9.0000 mL | INHALATION_SOLUTION | RESPIRATORY_TRACT | Status: AC
Start: 2024-02-20 — End: 2024-02-20
  Administered 2024-02-20: 9 mL via RESPIRATORY_TRACT
  Filled 2024-02-20: qty 9

## 2024-02-20 MED ORDER — LEVOFLOXACIN 500 MG TABLET
500.0000 mg | ORAL_TABLET | Freq: Every day | ORAL | 0 refills | Status: DC
Start: 2024-02-20 — End: 2024-02-26

## 2024-02-20 MED ORDER — PREDNISONE 10 MG TABLET
ORAL_TABLET | ORAL | 0 refills | Status: DC
Start: 2024-02-20 — End: 2024-03-23

## 2024-02-20 NOTE — Telephone Encounter (Signed)
 Patient called and left a message saying she is still coughing up green stuff and is halfway through her meds, but the meds don't seem to be helping. Wanted to know if she should get an xray done.  Hal Morales, [place] 02/20/2024 14:00

## 2024-02-20 NOTE — Telephone Encounter (Signed)
 Attempted to call patient; no answer, left a message for patient to call back.  [place], [place] 02/20/2024 15:09

## 2024-02-20 NOTE — ED Provider Notes (Signed)
 [facility] - Emergency Department  ED Primary Provider Note  History of Present Illness   Chief Complaint   Patient presents with    Cough     Joanna Black is a 78 y.o. female who had concerns including Cough.  Arrival: The patient arrived by Car    78 year old female, history of asthma, presents with a cough, shortness of breath.  States she has been sick over the past few days.  Earlier in her course of illness, she will be seen outpatient and prescribed doxycycline and Medrol Dosepak.  She has a few days left on this.  She denies associated fever.  Feels like her breathing isn't limiting her activity, but times makes her cough worse.  She has a other complaints with use congestion, ear fullness.  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   Constitutional:       General: She is not in acute distress.     Appearance: She is well-developed.   HENT:      Head: Normocephalic and atraumatic.   Eyes:      Conjunctiva/sclera: Conjunctivae normal.   Cardiovascular:      Rate and Rhythm: Normal rate and regular rhythm.      Heart sounds: No murmur heard.  Pulmonary:      Effort: Pulmonary effort is normal. No respiratory distress.      Breath sounds: Wheezing present.   Abdominal:      Palpations: Abdomen is soft.      Tenderness: There is no abdominal tenderness.   Musculoskeletal:         General: No swelling.      Cervical back: Neck supple.   Skin:     General: Skin is warm and dry.      Capillary Refill: Capillary refill takes less than 2 seconds.   Neurological:      Mental Status: She is alert.   Psychiatric:         Mood and Affect:  Mood normal.       Patient Data     Labs Ordered/Reviewed   BASIC METABOLIC PANEL - Abnormal; Notable for the following components:       Result Value    GLUCOSE 191 (*)     BUN 34 (*)     CREATININE 1.36 (*)     BUN/CREA RATIO 25 (*)     ESTIMATED GFR - FEMALE 40 (*)     All other components within normal limits   B-TYPE NATRIURETIC PEPTIDE (BNP),PLASMA - Abnormal; Notable for the following components:    BNP 212 (*)     All other components within normal limits   BLOOD GAS - Abnormal; Notable for the following components:    PCO2 (VENOUS) 52 (*)     All other components within normal limits    Narrative:     Manufacturer does not recommend venous sample for assessment of patient oxygenation status. A reference range for pO2, Oxyhemoglobin and Oxygen  Saturation is provided but abnormal results will not flag in Epic.   CBC WITH DIFF - Abnormal; Notable for the following components:    WBC 16.2 (*)     NEUTROPHIL # 14.61 (*)     LYMPHOCYTE # 0.69 (*)     IMMATURE GRANULOCYTE % 1.4 (*)     IMMATURE GRANULOCYTE # 0.23 (*)     All other components within normal limits   CBC/DIFF    Narrative:     The following orders were created for panel order CBC/DIFF.  Procedure                               Abnormality         Status                     ---------                               -----------         ------                     CBC WITH GMWN[[id number]]                Abnormal            Final result                 Please view results for these tests on the individual orders.     XR AP MOBILE CHEST   Final Result by Edi, Radresults In (02/28 1518)   No acute process.                                 Radiologist location ID: [facility]           Medical Decision Making        Medical Decision Making  78 year old female with symptoms of upper respiratory infection, possible bronchitis.  She is diffusely wheezing on exam.  Treated with a serial due to treatments, x-ray rule out pneumonia.  She is not hypoxic on arrival, no  acute respiratory distress.    Amount and/or Complexity of Data Reviewed  Labs: ordered.  Radiology: ordered.  ECG/medicine tests: ordered.    Risk  Prescription drug management.    Subjective improvement on reassessment. XR unremarkable.              Medications Ordered/Administered in the ED   ipratropium-albuterol 0.5 mg-3 mg(2.5 mg base)/3 mL Solution for Nebulization (9 mL Nebulization Given 02/20/24 1525)   predniSONE (DELTASONE) tablet (40 mg Oral Given 02/20/24 1617)     Clinical Impression   Asthma, unspecified asthma severity, unspecified whether complicated, unspecified whether persistent (Primary)       Disposition: Discharged

## 2024-02-20 NOTE — Telephone Encounter (Signed)
 Please contact patient make her aware orders been placed for chest x-ray.  Patient to complete current antibiotics.  In addition, order placed for Levaquin 500 mg p.o. once daily x5 days.  Patient to be sure and start a probiotic and/or eat some yogurt daily to help protect her gut from all the antibiotics.  Thank you.

## 2024-02-20 NOTE — ED Nurses Note (Signed)
 Marland Kitchen  Patient discharged home with family.  AVS reviewed with patient/care giver.  A written copy of the AVS and discharge instructions was given to the patient/care giver.  Questions sufficiently answered as needed.  Patient/care giver encouraged to follow up with PCP as indicated.  In the event of an emergency, patient/care giver instructed to call 911 or go to the nearest emergency room. Patient ambulated off the unit without incident, wheelchair assistance declined.

## 2024-02-22 LAB — ECG 12-LEAD
Atrial Rate: 76 {beats}/min
Calculated P Axis: 61 degrees
Calculated R Axis: -42 degrees
Calculated T Axis: 75 degrees
PR Interval: 168 ms
QRS Duration: 112 ms
QT Interval: 424 ms
QTC Calculation: 477 ms
Ventricular rate: 76 {beats}/min

## 2024-02-23 ENCOUNTER — Encounter (INDEPENDENT_AMBULATORY_CARE_PROVIDER_SITE_OTHER): Payer: Self-pay

## 2024-02-23 ENCOUNTER — Other Ambulatory Visit (INDEPENDENT_AMBULATORY_CARE_PROVIDER_SITE_OTHER): Payer: Self-pay | Admitting: Family Medicine

## 2024-02-23 ENCOUNTER — Ambulatory Visit (INDEPENDENT_AMBULATORY_CARE_PROVIDER_SITE_OTHER): Payer: Self-pay | Admitting: Family Medicine

## 2024-02-23 ENCOUNTER — Other Ambulatory Visit (INDEPENDENT_AMBULATORY_CARE_PROVIDER_SITE_OTHER): Payer: Self-pay

## 2024-02-23 DIAGNOSIS — Z7189 Other specified counseling: Secondary | ICD-10-CM

## 2024-02-23 DIAGNOSIS — F419 Anxiety disorder, unspecified: Secondary | ICD-10-CM

## 2024-02-23 MED ORDER — DIAZEPAM 5 MG TABLET
5.0000 mg | ORAL_TABLET | Freq: Once | ORAL | 0 refills | Status: DC | PRN
Start: 2024-02-23 — End: 2024-03-18

## 2024-02-23 NOTE — Telephone Encounter (Signed)
 Patient called and left a message saying she ended up going to the hospital on Friday because her cough was worse so now she needs a follow up appointment. Patient also stated she needs a pill because she is going to get a shot in her back tomorrow.  Hal Morales, [place] 02/23/2024 10:14

## 2024-02-23 NOTE — Nursing Note (Signed)
 Post Ed Follow-Up    Post ED Follow-Up:   Document completed and/or attempted interactive contact(s) after transition to home after emergency department stay.:   Transition Facility and relevant Date:   Discharge Date: 02/20/24  Discharge from [facility] Emergency Department?: Yes  Discharge Facility: [facility]  Contacted by: Don Broach RN  Contact method: Patient/Caregiver Telephone  Contact first attempt: 02/23/2024  4:23 PM  MyChart message sent?: Yes  Follow Up Visit: No answer/Unable to leave message  Outgoing call to patient for ED follow up.  No answer and unable to LVM.  MyCHart message sent.        Don Broach, RN  02/23/2024 16:24

## 2024-02-23 NOTE — Telephone Encounter (Signed)
 Order placed for one time Valium Rx. Thank you.

## 2024-02-23 NOTE — Telephone Encounter (Signed)
 Called and informed patient of doctors notes, patient voiced understanding.  Hal Morales, [place] 02/23/2024 15:02

## 2024-02-24 ENCOUNTER — Other Ambulatory Visit (HOSPITAL_BASED_OUTPATIENT_CLINIC_OR_DEPARTMENT_OTHER): Payer: Self-pay | Admitting: Emergency Medicine

## 2024-02-24 ENCOUNTER — Encounter (HOSPITAL_COMMUNITY): Payer: Self-pay | Admitting: Emergency Medicine

## 2024-02-24 ENCOUNTER — Encounter (HOSPITAL_COMMUNITY)
Admission: RE | Disposition: A | Discharge status: Home / Self Care | Payer: Self-pay | Source: Ambulatory Visit | Attending: Emergency Medicine

## 2024-02-24 ENCOUNTER — Ambulatory Visit
Admission: RE | Admit: 2024-02-24 | Discharge: 2024-02-24 | Disposition: A | Discharge status: Home / Self Care | Payer: Self-pay | Source: Ambulatory Visit | Attending: Emergency Medicine | Admitting: Emergency Medicine

## 2024-02-24 ENCOUNTER — Other Ambulatory Visit: Payer: Self-pay

## 2024-02-24 ENCOUNTER — Inpatient Hospital Stay (HOSPITAL_COMMUNITY)
Admission: RE | Admit: 2024-02-24 | Discharge: 2024-02-24 | Disposition: A | Discharge status: Home / Self Care | Source: Ambulatory Visit

## 2024-02-24 DIAGNOSIS — R52 Pain, unspecified: Secondary | ICD-10-CM

## 2024-02-24 DIAGNOSIS — M47816 Spondylosis without myelopathy or radiculopathy, lumbar region: Secondary | ICD-10-CM

## 2024-02-24 SURGERY — PAIN SERVICE RHIZOTOMY LUMBAR
Anesthesia: Local (Nurse-Monitored) | Laterality: Right | Wound class: Clean Wound: Uninfected operative wounds in which no inflammation occurred

## 2024-02-24 MED ORDER — LIDOCAINE (PF) 20 MG/ML (2 %) INJECTION SOLUTION
Freq: Once | INTRAMUSCULAR | Status: DC | PRN
Start: 2024-02-24 — End: 2024-02-24
  Administered 2024-02-24: 5 mL via INTRAMUSCULAR

## 2024-02-24 MED ORDER — LIDOCAINE (PF) 10 MG/ML (1 %) INJECTION SOLUTION
Freq: Once | INTRAMUSCULAR | Status: DC | PRN
Start: 2024-02-24 — End: 2024-02-24
  Administered 2024-02-24: 30 mL via INTRAMUSCULAR

## 2024-02-24 SURGICAL SUPPLY — 8 items
APPL ISPRP CHG 10.5ML CHLRPRP HI-LT ORNG PREP STRL LF (MED SURG SUPPLIES) ×1 IMPLANT
DRAPE TEAR RST LOW LINT 76X55IN LRG STRL SURG (DRAPE/PACKS/SHEETS/OR TOWEL) ×1 IMPLANT
GLOVE SURG 8 LF  PF SMOOTH BEAD CUF STRL CRM 11.8IN PROTEXIS PI MICRO PLISPRN THK7.9 MIL MICRO (GLOVES AND ACCESSORIES) ×1 IMPLANT
KIT ELECTROTH COOLIEF ADV CL RF STRL DISP (SURGICAL CUTTING SUPPLIES) ×1 IMPLANT
NEEDLE HYPO  30GA 1IN REG WL PRCSNGL POLYPROP REG BVL LL HUB DEHP-FR TAN STRL LF  DISP (MED SURG SUPPLIES) ×1 IMPLANT
PAD EG CORD DSPR ELECTRODE ADULT RF PAIN MGMT SYS NONST LF  DISP (SURGICAL CUTTING SUPPLIES) ×1 IMPLANT
SYRINGE LL 3ML LF  STRL GRAD N-PYRG DEHP-FR PVC FREE MED DISP CLR (MED SURG SUPPLIES) ×1 IMPLANT
TRAY UNIV NERVE BLOCK DISP SNG USE LF STRL (MED SURG SUPPLIES) ×1 IMPLANT

## 2024-02-24 NOTE — Nurses Notes (Signed)
 80% relief reported by patient post procedure.  Orlene Och, LPN  12/28/1094, 04:54    DVPRS OVERALL PAIN RATING POST PROCEDURE 2 of 10

## 2024-02-24 NOTE — Discharge Instructions (Signed)
 [facility]  AT [facility] - [place], [place] [address] - Phone: [phone])      Complications (Reasons to call):  Temperature greater than 101 degrees  Unusual redness or swelling at the injection site  Persistent nausea or vomiting or headache  Persistent weakness, numbness or bleeding  Loss of bowel or bladder control  If you are unable to reach your physician and your symptoms are severe, have yourself brought to the nearest Emergency Department or call 911    You may experience:  You may also experience a temporary increase in the level of your pain  You may experience weakness, tingling or heavy feelings in your legs the first few hours after your procedure, requiring you to be cautious when ambulating (walking).   Do NOT drive a car or operate heavy machinery for 24 hours after your procedure    Medications:  Most medications held for your procedure may be resumed one day after the procedure. Please ask your physician if you have questions about specific medications or the procedure itself.    Comfort measures:  You may use ice over the injection site  AVOID HEAT for the first 24 hours  After that ice or heat may be used as needed. DO NOT apply LONGER than 20 minutes and wait 20 minutes before reapplication  Avoid sitting in a bathtub, hot tub, pool, etc. for 3 days after your procedure    Activity:  Rest at home for the next 24 hours  Then increase activity as tolerated  Typically it is ok to return to work one day after the procedure

## 2024-02-24 NOTE — OR Surgeon (Signed)
 [facility]  [address]  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)    Cervical pain (neck)    Thoracic back pain    Chronic bilateral low back pain    Essential hypertension    Cardiomyopathy, dilated, nonischemic (CMS HCC)    Chronic GERD    Chronic systolic congestive heart failure (CMS HCC)    BMI 35.0-35.9,adult    Mixed hyperlipidemia    CKD (chronic kidney disease) stage 3, 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/min (CMS HCC)    History of colon polyps    Colon cancer screening    Gastroesophageal reflux disease    Dysphagia    Schatzki's ring    Chronic diarrhea    Diverticulosis of colon    Hypoxia    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)    Enteropathogenic Escherichia coli infection    Encounter for support and coordination of transition of care    Chronic anemia    Osteoporosis    IDA (iron deficiency anemia)    Bloating    Belching     Lumbar Spondylosis    Post Procedure Diagnosis: Same    Procedure: Right L4-5 and L5-S1 LUMBAR MEDIAL BRANCH RADIOFREQUENCY ABLATION UNDER FLUOROSCOPY    REASON FOR PROCEDURE: Lumbar arthropathy and pain    Attending:  Forbes Cellar, DO    Anesthesia: Local    Estimated Blood Loss: None    Complication: were no complications    MEDICATIONS INJECTED: Before the ablation, 1 mL of 2% Lidocainewas injected at each level.     LOCAL ANESTHETIC INJECTED: 2-4 mL of 1% lidocaine per site    SEDATION MEDICATIONS: None    PROCEDURE:  After informed consent was obtained, patient was taken to the fluoroscopy suite and placed in a prone position.    TIMEOUT:  Correct patient? Yes  Correct site? Yes  Correct side? Yes  Correct position?  Yes  Correct procedure? Yes  Correct medication? Yes  Site marked? Yes  H&P note completed? Yes  Consents verified? Yes  Relevant lab results available? Yes  Allergies reviewed? Yes  Is all required equipment for the procedure available? Yes  Is documentation verified? Yes  Is Time Out verified by doctor and nurse? Yes    The skin was prepped and draped in the usual sterile fashion using chlorhexidine.  Time-out was taken to identify the correct patient, procedure and side prior to starting the procedure. Lying in a prone position, the patient was prepped and draped in the usual sterile fashion using ChloraPrep and a fenestrated drape. The levels were determined under fluoroscopy. Local anesthetic was given by raising a skin wheal and going down to the hub of a 27-gauge 1.25-inch needle. A _[id number]_[id number] Coolief radiofrequency needle was introduced to the anatomic location of the medial branch at the junction of the superior articular process and transverse process utilizing intermittent fluoroscopy. Motor stimulation up to 2 volts was done to confirm no ablation of the ventral ramus at each level. 1 mL of 2% Lidocaine was then injected slowly at each level. After waiting  30-60 seconds, ablation was performed utilizing a radiofrequency generator at 80 degrees C for 150 seconds. After the ablation, the above injectate was given at each level.   Each needle was rinsed with 1cc of 1% Lidocaine before removal.  The procedure was well tolerated and all needles were removed intact.  Sterile bandage applied at each site.    The patient (or responsible party) was given post-procedure and discharge instructions to follow at home. The patient was discharged in stable condition. A follow-up appointment was made.    Notes:       Patient was observed in recovery and discharged home with written instruction under supervision in stable condition.    Forbes Cellar, DO 02/24/2024, 09:55  Division of Pain Medicine

## 2024-02-24 NOTE — OR Nursing (Signed)
 Medications provided to sterile field. See MD notes for amounts administered.

## 2024-02-24 NOTE — OR Nursing (Signed)
 [place] Pain Rating Scale     On a scale of 0-10, during the past 24 hours, pain has interfered with you usual activity:  6     On a scale of 0-10, during the past 24 hours, pain has interfered with your sleep:  5    On a scale of 0-10, during the past 24 hours, pain has affected your mood:   8    On a scale of 0-10, during the past 24 hours, pain has contributed to your stress:   3    On a scale of 0-10, what is your overall pain Rating: 7

## 2024-02-25 ENCOUNTER — Telehealth (HOSPITAL_BASED_OUTPATIENT_CLINIC_OR_DEPARTMENT_OTHER): Payer: Self-pay | Admitting: Emergency Medicine

## 2024-02-25 NOTE — Progress Notes (Unsigned)
 FAMILY MEDICINE, [facility] BUILDING  [address] [place] [address]  Operated by [facility]  Medicare Annual Wellness Visit    Name: Joanna Black MRN:  [id number]   Date: 02/26/2024 Age: 78 y.o.       SUBJECTIVE:   Joanna Black is a 78 y.o. female for presenting for Medicare Wellness exam.   I have reviewed and reconciled the medication list with the patient today.    Comprehensive Health Assessment:  {Comprehensive Health Assessment:32411}    I have reviewed and updated as appropriate the past medical, family and social history. 02/26/2024 as summarized below:  Past Medical History:   Diagnosis Date    Arthritis     Asthma     Back problem     BMI 35.0-35.9,adult 02/16/2020    CAD (coronary artery disease)     mild    Cataract     Cataracts, bilateral     CHF (congestive heart failure) (CMS HCC)     Chronic bronchitis with emphysema (CMS HCC)     Chronic low back pain     Claustrophobia     Depression     Diabetes (CMS HCC)     Diabetes mellitus, type 2 (CMS HCC)     Encounter for support and coordination of transition of care 11/16/2022    Esophageal reflux     Essential hypertension 01/28/2019    H/O urinary tract infection     HH (hiatus hernia)     History of kidney disease     states, "low kidney functions."    Hypercholesterolemia     Hypertension     Hypertriglyceridemia     Type 2 diabetes mellitus (CMS HCC)      Past Surgical History:   Procedure Laterality Date    Cataract extraction, bilateral Bilateral     Colonoscopy      Endoscopic ultrasound esophgeal  04/15/2023    Esophagogastroduodenoscopy      Hx carpal tunnel release      Hx cataract removal Right 12/30/2019    Hx cataract removal Left 01/06/2020    Hx cholecystectomy      Hx exposure to metal shavings      Hx heart catheterization      Hx hysterectomy      Hx oophorectomy      Hx tah and bso      Hx tubal ligation      Hx vein stripping       Current Outpatient Medications   Medication Sig    acetaminophen (TYLENOL) 500  mg Oral Tablet Take 1 Tablet (500 mg total) by mouth Every night as needed Takes 2 at night    albuterol sulfate (PROVENTIL OR VENTOLIN OR PROAIR) 90 mcg/actuation Inhalation oral inhaler Take 1-2 Puffs by inhalation Every 6 hours as needed    albuterol sulfate (PROVENTIL) 2.5 mg /3 mL (0.083 %) Inhalation nebulizer solution Take 3 mL (2.5 mg total) by nebulization Three times a day as needed for Wheezing    aspirin (ECOTRIN) 81 mg Oral Tablet, Delayed Release (E.C.) Take 1 Tablet (81 mg total) by mouth Once a day    calcium carbonate/vitamin D3 (VITAMIN D-3 ORAL) Take 2,000 Int'l Units/day by mouth Once a day    conjugated estrogens (PREMARIN) 0.625 mg/gram Vaginal Cream Insert 0.5 g into the vagina Every night    diazePAM (VALIUM) 5 mg Oral Tablet Take 1 Tablet (5 mg total) by mouth Once, as needed for Anxiety  for up to 1 dose    ezetimibe (ZETIA) 10 mg Oral Tablet TAKE 1 TABLET BY MOUTH EVERY DAY IN THE EVENING    ferrous sulfate (FEOSOL) 325 mg (65 mg iron) Oral Tablet Take 1 Tablet (325 mg total) by mouth Once a day for 90 days For 3 months    FLUoxetine (PROZAC) 20 mg Oral Capsule Take 1 Capsule (20 mg total) by mouth Once a day    furosemide (LASIX) 20 mg Oral Tablet TAKE 1 TABLET (20 MG TOTAL) BY MOUTH TWICE PER DAY AS NEEDED (Patient taking differently: Take 1 Tablet (20 mg total) by mouth Once a day)    levoFLOXacin (LEVAQUIN) 500 mg Oral Tablet Take 1 Tablet (500 mg total) by mouth Once a day for 5 days    magnesium oxide (MAG-OX) 400 mg Oral Tablet Take 1 Tablet (400 mg total) by mouth Three times a day    metoprolol succinate (TOPROL-XL) 25 mg Oral Tablet Sustained Release 24 hr Take 1 Tablet (25 mg total) by mouth Every night    naproxen (NAPROSYN) 500 mg Oral Tablet Take 1 Tablet (500 mg total) by mouth Twice daily with food    omeprazole (PRILOSEC) 40 mg Oral Capsule, Delayed Release(E.C.) Take 1 Capsule (40 mg total) by mouth Once a day Indications: gastroesophageal reflux disease    predniSONE  (DELTASONE) 10 mg Oral Tablet Take 4 Tablets (40 mg total) by mouth Once a day for 3 days, THEN 3 Tablets (30 mg total) Once a day for 3 days, THEN 2 Tablets (20 mg total) Once a day for 3 days, THEN 1 Tablet (10 mg total) Once a day for 3 days. Then resume prior prescription at 5mg /day.    tiZANidine (ZANAFLEX) 4 mg Oral Tablet TAKE 1 TABLET BY MOUTH THREE TIMES A DAY AS NEEDED FOR MUSCLE CRAMPS    TRULICITY 0.75 mg/0.5 mL Subcutaneous Pen Injector INJECT 0.5 ML SUBCUTANEOUSLY  EVERY 7 DAYS     Family Medical History:       Problem Relation (Age of Onset)    Bone cancer Father    Diabetes Multiple family members    Hypertension (High Blood Pressure) Multiple family members    No Known Problems Mother    Stomach Cancer Paternal Grandmother            Social History     Socioeconomic History    Marital status: Married     Spouse name: Duke Salvia    Number of children: 3    Years of education: 13    Highest education level: High school graduate   Occupational History    Occupation: Retired   Tobacco Use    Smoking status: Never     Passive exposure: Never    Smokeless tobacco: Never   Vaping Use    Vaping status: Never Used   Substance and Sexual Activity    Alcohol use: No     Alcohol/week: 0.0 standard drinks of alcohol    Drug use: No    Sexual activity: Not Currently     Partners: Male   Social History Narrative    MARRIED.  Lives in single story home.  Heats home with electric and has well water.        April Haney, LPN  02/27/6577, 14:01     Social Determinants of Health     Financial Resource Strain: Low Risk  (04/17/2023)    Financial Resource Strain     SDOH Financial: No   Transportation Needs:  Low Risk  (04/17/2023)    Transportation Needs     SDOH Transportation: No   Social Connections: Low Risk  (04/17/2023)    Social Connections     SDOH Social Isolation: 5 or more times a week   Intimate Partner Violence: Low Risk  (04/17/2023)    Intimate Partner Violence     SDOH Domestic Violence: No   Housing Stability:  Low Risk  (04/17/2023)    Housing Stability     SDOH Housing Situation: I have housing.     SDOH Housing Worry: No   Health Literacy: Medium Risk (11/18/2023)    Health Literacy     SDOH Health Literacy: Occasionally   Employment Status: Low Risk  (04/17/2023)    Employment Status     SDOH Employment: Otherwise unemployed but not seeking work (ex. Consulting civil engineer, retired, disabled, unpaid primary care giver)         List of Current Health Care Providers   Care Team       PCP       Name Type Specialty Phone Number    [place], Tomie China, [place] [facility] [phone]              Care Team       No care team found                      Health Maintenance   Topic Date Due    Adult Tdap-Td (1 - Tdap) Never done    RSV Adult 60+ or Pregnancy (1 - 1-dose 75+ series) Never done    Diabetic Retinal Exam  10/25/2021    Osteoporosis screening  12/05/2022    Covid-19 Vaccine (5 - 2024-25 season) 08/24/2023    Medicare Annual Wellness Visit - Calendar Year Insurers  12/24/2023    Diabetic A1C  02/18/2024    Diabetic Kidney Health Microalb/Cr Ratio  08/17/2024    Diabetic Kidney Health eGFR  02/19/2025    Hepatitis C screening  Completed    Influenza Vaccine  Completed    Shingles Vaccine  Completed    Pneumococcal Vaccination, Age 12+  Completed     Medicare Wellness Assessment      Advance Directives                      Activities of Daily Living                   Cognitive Function Screen (1=Yes, 0=No)                                                   Fall Risk Screen       Depression Screen           Pain Score        Substance Use-Abuse Screening     Tobacco Use           Alcohol use           Prescription Drug Use                 Illicit Drug Use  OBJECTIVE:   There were no vitals taken for this visit.       Physical Exam    Health Maintenance Due   Topic Date Due    Adult Tdap-Td (1 - Tdap) Never done    RSV Adult 60+ or Pregnancy (1 - 1-dose 75+ series) Never done    Diabetic Retinal  Exam  10/25/2021    Osteoporosis screening  12/05/2022    Covid-19 Vaccine (5 - 2024-25 season) 08/24/2023    Medicare Annual Wellness Visit - Calendar Year Insurers  12/24/2023    Diabetic A1C  02/18/2024        ASSESSMENT & PLAN:   No diagnosis found.     Identified Risk Factors/ Recommended Actions             Patient declined Advanced Directives information.         No orders of the defined types were placed in this encounter.         The patient has been educated about risk factors and recommended preventive care. Written Prevention Plan completed/ updated and given to patient (see After Visit Summary).  Medication reconciliation was performed during today's encounter.     No follow-ups on file.      Genene Churn, DO  FAMILY MEDICINE, [facility] BUILDING  [address] [place] [address]  Phone: [phone]  Fax: [phone]

## 2024-02-25 NOTE — Telephone Encounter (Signed)
 Called to see how patient was doing after their Black LUMBAR RFA injection on 02/24/24 with Dr. Allyson Sabal, and what percent of relief they had. No answer, left message to return our call  Joanna Black, [place]

## 2024-02-26 ENCOUNTER — Other Ambulatory Visit: Payer: Self-pay

## 2024-02-26 ENCOUNTER — Ambulatory Visit: Attending: Family Medicine | Admitting: Family Medicine

## 2024-02-26 VITALS — BP 130/80 | HR 96 | Temp 97.9°F | Resp 17 | Ht 64.0 in | Wt 214.0 lb

## 2024-02-26 DIAGNOSIS — Z7985 Long-term (current) use of injectable non-insulin antidiabetic drugs: Secondary | ICD-10-CM | POA: Insufficient documentation

## 2024-02-26 DIAGNOSIS — J449 Chronic obstructive pulmonary disease, unspecified: Secondary | ICD-10-CM | POA: Insufficient documentation

## 2024-02-26 DIAGNOSIS — E785 Hyperlipidemia, unspecified: Secondary | ICD-10-CM | POA: Insufficient documentation

## 2024-02-26 DIAGNOSIS — Z7982 Long term (current) use of aspirin: Secondary | ICD-10-CM | POA: Insufficient documentation

## 2024-02-26 DIAGNOSIS — N183 Chronic kidney disease, stage 3 unspecified (CMS HCC): Secondary | ICD-10-CM | POA: Insufficient documentation

## 2024-02-26 DIAGNOSIS — E1122 Type 2 diabetes mellitus with diabetic chronic kidney disease: Secondary | ICD-10-CM | POA: Insufficient documentation

## 2024-02-26 DIAGNOSIS — Z0000 Encounter for general adult medical examination without abnormal findings: Secondary | ICD-10-CM | POA: Insufficient documentation

## 2024-02-26 DIAGNOSIS — N184 Chronic kidney disease, stage 4 (severe): Secondary | ICD-10-CM | POA: Insufficient documentation

## 2024-02-26 DIAGNOSIS — E1159 Type 2 diabetes mellitus with other circulatory complications: Secondary | ICD-10-CM | POA: Insufficient documentation

## 2024-02-26 DIAGNOSIS — Z79899 Other long term (current) drug therapy: Secondary | ICD-10-CM | POA: Insufficient documentation

## 2024-02-26 DIAGNOSIS — M316 Other giant cell arteritis: Secondary | ICD-10-CM | POA: Insufficient documentation

## 2024-02-26 DIAGNOSIS — J069 Acute upper respiratory infection, unspecified: Secondary | ICD-10-CM | POA: Insufficient documentation

## 2024-02-26 DIAGNOSIS — Z6836 Body mass index (BMI) 36.0-36.9, adult: Secondary | ICD-10-CM | POA: Insufficient documentation

## 2024-02-26 DIAGNOSIS — I152 Hypertension secondary to endocrine disorders: Secondary | ICD-10-CM | POA: Insufficient documentation

## 2024-02-26 DIAGNOSIS — E119 Type 2 diabetes mellitus without complications: Secondary | ICD-10-CM | POA: Insufficient documentation

## 2024-02-26 DIAGNOSIS — I5030 Unspecified diastolic (congestive) heart failure: Secondary | ICD-10-CM | POA: Insufficient documentation

## 2024-02-26 DIAGNOSIS — E1169 Type 2 diabetes mellitus with other specified complication: Secondary | ICD-10-CM | POA: Insufficient documentation

## 2024-02-26 MED ORDER — LEVOFLOXACIN 500 MG TABLET
500.0000 mg | ORAL_TABLET | Freq: Every day | ORAL | 0 refills | Status: DC
Start: 2024-02-26 — End: 2024-03-23

## 2024-02-26 NOTE — Nursing Note (Signed)
 Patient declines diabetic eye exam, osteoporosis screening, covid booster, Tdap and RSV vaccine at this time. Erskine Squibb, [place]  02/26/2024 13:05

## 2024-02-26 NOTE — Patient Instructions (Signed)
 Medicare Preventive Services  Medicare coverage information Recommendation for YOU   Heart Disease and Diabetes   Lipid profile Every 5 years or more often if at risk for cardiovascular disease     Lab Results   Component Value Date    CHOLESTEROL 159 08/18/2023    HDLCHOL 43 (L) 08/18/2023    LDLCHOL 88 08/18/2023    TRIG 160 (H) 08/18/2023         Diabetes Screening    Yearly for those at risk for diabetes, 2 tests per year for those with prediabetes Last Glucose:      Diabetes Self Management Training or Medical Nutrition Therapy  For those with diabetes, up to 10 hrs initial training within a year, subsequent years up to 2 hrs of follow up training Optional for those with diabetes     Medical Nutrition Therapy  Three hours of one-on-one counseling in first year, two hours in subsequent years Optional for those with diabetes, kidney disease   Intensive Behavioral Therapy for Obesity  Face-to-face counseling, first month every week, month 2-6 every other week, month 7-12 every month if continued progress is documented Optional for those with Body Mass Index 30 or higher  Your Body mass index is 36.73 kg/m.   Tobacco Cessation (Quitting) Counseling   Covers up to 8 smoking and tobacco-use cessation counseling sessions in a 85-month period.    Optional for those that use tobacco   Cancer Screening Last Completion Date   Colorectal screening   For anyone age 49 to 32 or any age if high risk:  Screening Colonoscopy every 10 yrs if low risk,  more frequent if higher risk  OR  Cologuard Stool DNA test once every 3 years OR  Fecal Occult Blood Testing yearly OR  Flexible  Sigmoidoscopy  every 5 yr OR  CT Colonography every 5 yrs      See below for due date if applicable.   Screening Pap Test   Recommended every 3 years for all women age 68 to 24, or every five years if combined with HPV test (routine screening not needed after total hysterectomy).  Medicare covers every 2 years or yearly if high risk.  Screening Pelvic  Exam   Medicare covers every 2 years, yearly if high risk or childbearing age with abnormal Pap in last 3 yrs.     See below for due date if applicable.   Screening Mammogram   Recommended every 2 years for women age 16 to 64, or more frequent if you have a higher risk. Selectively recommended for women between 40-49 based on shared decisions about risk. Covered by Medicare up to every year for women age 4 or older   See below for due date if applicable.         Lung Cancer Screening  Annual low dose computed tomography (LDCT scan) is recommended for those age 37-80 who smoked 20 pack-years and are current smokers or quit smoking within past 15 years, after counseling by your doctor or nurse clinician about the possible benefits or harms.     See below for due date if applicable.   Vaccinations   Respiratory syncytial virus (RSV)  Age 51 years or older: Based on shared clinical decision-making with your provider.  Pneumococcal Vaccine  Recommended routinely age 3+ with one or two separate vaccines based on your risk. Recommended before age 61 if medical conditions with increased risk  Seasonal Influenza Vaccine  Once every flu season  Hepatitis B Vaccine  3 doses if risk (including anyone with diabetes or liver disease)  Shingles Vaccine  Two doses at age 28 or older  Diphtheria Tetanus Pertussis Vaccine  ONCE as adult, booster every 10 years     Immunization History   Administered Date(s) Administered    Covid-19 Vaccine,Moderna,12 Years+ 01/20/2020, 02/17/2020, 11/01/2020    Covid-19 Vaccine,Pfizer Bivalent,36mcg/0.3ml,12 yrs+ 11/14/2021    FLUZONE HD VACCINE (ADMIN) 09/25/2020    High-Dose Influenza Vaccine, 65+ 11/25/2017, 10/26/2018, 09/25/2020, 11/14/2021, 12/02/2023    INFLUENZA VIRUS VACCINE (ADMIN) 10/20/2015    Influenza Vaccine, 6 month-adult 09/09/2008    Influenza Vaccine, 65+ 09/07/2019, 10/03/2022    PREVNAR 13 01/06/2013    Pneumovax 01/06/2014    ZOSTAVAX (VARICELLA ZOSTER VACCINE) 10/21/2013     Zoster Family 10/21/2013     Shingles vaccine and Diphtheria Tetanus Pertussis vaccines are available at pharmacies or local health department without a prescription.   Other Preventative Screening  Last Completion Date   Bone Densitometry   Screening: All females ages 27 and older every 10 years if initial screening normal. Postmenopausal women ages 75-64 need screening with one or more risk factor: previous fracture, parental hip fracture, current smoker, low body weight, excessive alcohol use, Rheumatoid Arthritis   For women with diagnosed Osteoporosis, follow up is recommended every 2 years or a frequency recommended by your provider.     --12/05/2020  See below for due date if applicable.     Glaucoma Screening   Yearly if in high risk group such as diabetes, family history, African American age 42+ or Hispanic American age 61+   See your eye care provider for screening.   Hepatitis C Screening   Recommended  for those born between ages 18-79 years.   --09/17/2023  See below for due date if applicable.     HIV Testing  Recommended routinely at least ONCE, covered every year for age 66 to 54 regardless of risk, and every year for age over 77 who ask for the test or higher risk. Yearly or up to 3 times in pregnancy         See below for due date if applicable.   Abdominal Aortic Aneurysm Screening Ultrasound   Once with a family history of abdominal aortic aneurysms OR a female between 67-75 and have smoked at least 100 cigarettes in your lifetime.         See below for due date if applicable.       Your Personalized Schedule for Preventive Tests     Health Maintenance: Pending and Last Completed         Date Due Completion Date    Adult Tdap-Td (1 - Tdap) Never done ---    RSV Adult 60+ or Pregnancy (1 - 1-dose 75+ series) Never done ---    Diabetic Retinal Exam 10/25/2021 10/26/2019    Osteoporosis screening 12/05/2022 12/05/2020    Covid-19 Vaccine (5 - 2024-25 season) 08/24/2023 11/14/2021    Diabetic A1C  02/18/2024 08/18/2023    Diabetic Kidney Health Microalb/Cr Ratio 08/17/2024 08/18/2023    Diabetic Kidney Health eGFR 02/19/2025 02/20/2024                  For Information on Advanced Directives for Health Care:  [place]:  [email or web address]  [place], [place], [place], [place] General Information: [email or web address]

## 2024-02-26 NOTE — Nursing Note (Signed)
 02/26/24 1300   Medicare Wellness Assessment   Medicare initial or wellness physical in the last year? Yes   Advance Directives   Does patient have a living will or MPOA No   Advance directive information given to the patient today? Patient Declined   Activities of Daily Living   Do you need help with dressing, bathing, or walking? No   Do you need help with shopping, housekeeping, medications, or finances? No   Do you have rugs in hallways, broken steps, or poor lighting? No   Do you have grab bars in your bathroom, non-slip strips in your tub, and hand rails on your stairs? No   Cognitive Function Screen   What is you age? 1   What is the time to the nearest hour? 1   What is the year? 1   What is the name of this clinic? 1   Can the patient recognize two persons (the doctor, the nurse, home help, etc.)? 1   What is the date of your birth? (day and month sufficient)  1   In what year did World War II end? 1   Who is the current president of the [place]? 1   Count from 20 down to 1? 1   Depression Screen   Little interest or pleasure in doing things. 1   Feeling down, depressed, or hopeless 1   PHQ 2 Total 2   Substance Use Screening   In Past 12 MONTHS, how often have you used any tobacco product (for example, cigarettes, e-cigarettes, cigars, pipes, or smokeless tobacco)? Never   In the PAST 12 MONTHS, how often have you had 5 (men)/4 (women) or more drinks containing alcohol in one day? Never   In the PAST 12 months, how often have you used any prescription medications just for the feeling, more than prescribed, or that were not prescribed for you? Prescriptions may include: opioids, benzodiazepines, medications for ADHD Never   In the PAST 12 MONTHS, how often have you used any drugs, including marijuana, cocaine or crack, heroin, methamphetamine, hallucinogens, ecstasy/MDMA? Never   Fall Risk Assessment   Do you feel unsteady when standing or walking? Yes   Do you worry about falling? Yes   Have you  fallen in the past year? Yes   How many times have you fallen? Once   Were you ever injured from falling? No   Timed up and go test (in seconds) 10   Urinary Incontinence Screen   Do you ever leak urine when you don't want to? YES

## 2024-02-27 ENCOUNTER — Telehealth (HOSPITAL_BASED_OUTPATIENT_CLINIC_OR_DEPARTMENT_OTHER): Payer: Self-pay | Admitting: Emergency Medicine

## 2024-02-27 NOTE — Telephone Encounter (Signed)
 Called to see how patient was doing after their LUMBAR RFA injection on 02/24/24 with Dr. Allyson Sabal, and what percent of relief they had. No answer, left message to return our call  Arvid Right, [place]

## 2024-02-29 ENCOUNTER — Other Ambulatory Visit (INDEPENDENT_AMBULATORY_CARE_PROVIDER_SITE_OTHER): Payer: Self-pay | Admitting: Family Medicine

## 2024-03-01 ENCOUNTER — Ambulatory Visit (INDEPENDENT_AMBULATORY_CARE_PROVIDER_SITE_OTHER): Payer: Self-pay | Admitting: Neurological Surgery

## 2024-03-02 ENCOUNTER — Telehealth (HOSPITAL_BASED_OUTPATIENT_CLINIC_OR_DEPARTMENT_OTHER): Payer: Self-pay

## 2024-03-02 ENCOUNTER — Ambulatory Visit (INDEPENDENT_AMBULATORY_CARE_PROVIDER_SITE_OTHER)
Admission: RE | Admit: 2024-03-02 | Discharge: 2024-03-02 | Disposition: A | Discharge status: Home / Self Care | Source: Ambulatory Visit

## 2024-03-02 ENCOUNTER — Encounter (INDEPENDENT_AMBULATORY_CARE_PROVIDER_SITE_OTHER): Payer: Self-pay | Admitting: Student in an Organized Health Care Education/Training Program

## 2024-03-02 ENCOUNTER — Other Ambulatory Visit: Payer: Self-pay

## 2024-03-02 ENCOUNTER — Other Ambulatory Visit (HOSPITAL_BASED_OUTPATIENT_CLINIC_OR_DEPARTMENT_OTHER): Payer: Self-pay | Admitting: Student in an Organized Health Care Education/Training Program

## 2024-03-02 ENCOUNTER — Ambulatory Visit
Payer: Medicare PPO | Attending: Student in an Organized Health Care Education/Training Program | Admitting: Student in an Organized Health Care Education/Training Program

## 2024-03-02 VITALS — BP 124/80 | HR 87 | Temp 97.6°F | Ht 64.0 in | Wt 217.4 lb

## 2024-03-02 DIAGNOSIS — M353 Polymyalgia rheumatica: Secondary | ICD-10-CM | POA: Insufficient documentation

## 2024-03-02 DIAGNOSIS — R058 Other specified cough: Secondary | ICD-10-CM

## 2024-03-02 DIAGNOSIS — M810 Age-related osteoporosis without current pathological fracture: Secondary | ICD-10-CM | POA: Insufficient documentation

## 2024-03-02 DIAGNOSIS — D509 Iron deficiency anemia, unspecified: Secondary | ICD-10-CM

## 2024-03-02 NOTE — Telephone Encounter (Signed)
 Received refill request for Ferrous Sulfate. Called and spoke to patient's son, Joanna Black. Advised we would like for patient to have level checked before prescribing. Advised the order is placed.   [name] voiced understanding and will advise patient and will have done at [facility].  Horton Finer, LPN

## 2024-03-02 NOTE — Progress Notes (Signed)
 Joanna Black MEDICAL  [address]  Joanna Black [facility] [address]    HPI:  This is a case of a 78 y.o. year old female who comes in today for follow-up of polymyalgia rheumatica    Medical conditions notable for:  -hospitalized 10/29/22-11/01/22 for acute hypoxic respiratory failure.  That have significant proximal pain and weakness in her shoulders and hips with elevated inflammatory markers.  Discharged on prednisone 50 mg daily.  -obesity BMI 36   -asthma   -coronary artery disease   -CKD stage 3   -hypertension    Polymyalgia rheumatica regimen   -prednisone 5 mg daily    214 dry wt.  Patient does try to wear self regularly.  She had worsening symptoms when she reduce the dose from 5 mg to 4 mg daily.  She had increased pain in her shoulders and hip girdle region.  Patient recently has been on high doses of corticosteroids for suspected viral pneumonia.  Patient is doing well regarding a joint perspective.    Initial HPI listed below  She was having neck and shoulder pain. She had been getting left sided frontal headaches at that time. No jaw locking or issues with her tongue. She had some blurry vision around that time, but no vision loss. Patient is currently on prednisone 5 mg daily for PMR for around a month. Shoulders are back to her baseline. She has had issues with heartburn before but it is well controlled currently on omeprazole. She has had esophageal dilations in the past.     Denies having any shortness of breath, chest pain, abdominal pain, fevers, unintential weight loss.    All systems reviewed and are otherwise unremarkable unless stated above.    Social Hx- no alcohol use, no smoking, resides in [place]   Family Hx- no family hx of RA or SLE    Current Outpatient Medications   Medication Sig Dispense Refill    acetaminophen (TYLENOL) 500 mg Oral Tablet Take 1 Tablet (500 mg total) by mouth Every night as needed Takes 2 at night      albuterol sulfate (PROVENTIL OR VENTOLIN OR  PROAIR) 90 mcg/actuation Inhalation oral inhaler Take 1-2 Puffs by inhalation Every 6 hours as needed 1 Each 3    albuterol sulfate (PROVENTIL) 2.5 mg /3 mL (0.083 %) Inhalation nebulizer solution Take 3 mL (2.5 mg total) by nebulization Three times a day as needed for Wheezing 300 mL 5    aspirin (ECOTRIN) 81 mg Oral Tablet, Delayed Release (E.C.) Take 1 Tablet (81 mg total) by mouth Once a day      calcium carbonate/vitamin D3 (VITAMIN D-3 ORAL) Take 2,000 Int'l Units/day by mouth Once a day      conjugated estrogens (PREMARIN) 0.625 mg/gram Vaginal Cream Insert 0.5 g into the vagina Every night 30 g 5    diazePAM (VALIUM) 5 mg Oral Tablet Take 1 Tablet (5 mg total) by mouth Once, as needed for Anxiety for up to 1 dose 1 Tablet 0    ezetimibe (ZETIA) 10 mg Oral Tablet TAKE 1 TABLET BY MOUTH EVERY DAY IN THE EVENING 90 Tablet 1    ferrous sulfate (FEOSOL) 325 mg (65 mg iron) Oral Tablet Take 1 Tablet (325 mg total) by mouth Once a day for 90 days For 3 months 90 Tablet 0    FLUoxetine (PROZAC) 20 mg Oral Capsule Take 1 Capsule (20 mg total) by mouth Once a day 90 Capsule 1    furosemide (LASIX) 20  mg Oral Tablet TAKE 1 TABLET (20 MG TOTAL) BY MOUTH TWICE PER DAY AS NEEDED 180 Tablet 1    levoFLOXacin (LEVAQUIN) 500 mg Oral Tablet Take 1 Tablet (500 mg total) by mouth Once a day for 5 days 5 Tablet 0    magnesium oxide (MAG-OX) 400 mg Oral Tablet Take 1 Tablet (400 mg total) by mouth Three times a day 30 Tablet 0    metoprolol succinate (TOPROL-XL) 25 mg Oral Tablet Sustained Release 24 hr Take 1 Tablet (25 mg total) by mouth Every night 90 Tablet 1    naproxen (NAPROSYN) 500 mg Oral Tablet Take 1 Tablet (500 mg total) by mouth Twice daily with food 60 Tablet 3    omeprazole (PRILOSEC) 40 mg Oral Capsule, Delayed Release(E.C.) Take 1 Capsule (40 mg total) by mouth Once a day Indications: gastroesophageal reflux disease 90 Capsule 1    predniSONE (DELTASONE) 10 mg Oral Tablet Take 4 Tablets (40 mg total) by mouth  Once a day for 3 days, THEN 3 Tablets (30 mg total) Once a day for 3 days, THEN 2 Tablets (20 mg total) Once a day for 3 days, THEN 1 Tablet (10 mg total) Once a day for 3 days. Then resume prior prescription at 5mg /day. 30 Tablet 0    tiZANidine (ZANAFLEX) 4 mg Oral Tablet TAKE 1 TABLET BY MOUTH THREE TIMES A DAY AS NEEDED FOR MUSCLE CRAMPS 270 Tablet 1    TRULICITY 0.75 mg/0.5 mL Subcutaneous Pen Injector INJECT THE CONTENTS OF ONE PEN  SUBCUTANEOUSLY WEEKLY AS  DIRECTED 6 mL 3     No current facility-administered medications for this visit.     Allergies   Allergen Reactions    Amoxicillin  Other Adverse Reaction (Add comment)     Unknown reaction    Augmentin [Amoxicillin-Pot Clavulanate] Nausea/ Vomiting    Statins-Hmg-Coa Reductase Inhibitors Myalgia     Past Medical History:   Diagnosis Date    Arthritis     Asthma     Back problem     BMI 35.0-35.9,adult 02/16/2020    CAD (coronary artery disease)     mild    Cataract     Cataracts, bilateral     CHF (congestive heart failure) (CMS HCC)     Chronic bronchitis with emphysema (CMS HCC)     Chronic low back pain     Claustrophobia     Depression     Diabetes (CMS HCC)     Diabetes mellitus, type 2 (CMS HCC)     Encounter for support and coordination of transition of care 11/16/2022    Esophageal reflux     Essential hypertension 01/28/2019    H/O urinary tract infection     HH (hiatus hernia)     History of kidney disease     states, "low kidney functions."    Hypercholesterolemia     Hypertension     Hypertriglyceridemia     Type 2 diabetes mellitus (CMS HCC)          Past Surgical History:   Procedure Laterality Date    CATARACT EXTRACTION, BILATERAL Bilateral     bilateral lens implant    COLONOSCOPY      ENDOSCOPIC ULTRASOUND ESOPHGEAL  04/15/2023    ESOPHAGOGASTRODUODENOSCOPY      HX CARPAL TUNNEL RELEASE      HX CATARACT REMOVAL Right 12/30/2019    HX CATARACT REMOVAL Left 01/06/2020    HX CHOLECYSTECTOMY      HX EXPOSURE TO  METAL SHAVINGS      HX HEART  CATHETERIZATION      HX HYSTERECTOMY      HX OOPHORECTOMY      HX TAH AND BSO      HX TUBAL LIGATION      HX VEIN STRIPPING      Left leg     PHYSICAL EXAM:  VITALS: BP 124/80   Pulse 87   Temp 36.4 C (97.6 F) (Thermal Scan)   Ht 1.626 m (5\' 4" )   Wt 98.6 kg (217 lb 6 oz)   SpO2 96%   BMI 37.31 kg/m     General- well appearing woman resting comfortably in chair  Neurologic- no focal deficits noted, speaks in full sentences and answers questions appropriately   Skin-  no rashes noted on exposed skin, normal appearing finger nails bilaterally   Musculoskeletal- no synovitis, normal range of motion shoulders bilaterally,5/5 muscle strength in upper and lower extremities  Psych- appropriate mood and corresponding affect     Labs/imaging:  Labs reviewed from 02/20/2024  WBC 16.2, hemoglobin 13.3, platelet count 333    Labs reviewed from 08/18/2023   WBC 9.4, hemoglobin 10.9, platelet count 424 (baseline platelet count typically in the 200's)      ESR 15, CRP 29 (CRP 70 from 2 weeks prior)    Region BMD  (g/cm) Young Adult  T-score Age Matched  Z-score   AP Spine 1.085 -0.8 0.0   Left Hip total 0.815 -1.5 -0.5   Left Hip Femoral Neck 0.729 -2.2 -0.9     Hip total           Hip Femoral Neck           Forearm           Forearm            Note: T-Scores (Standard deviation from normal young adult mean)  Normal T-Score > -1 BMD within 1 SD of a "young normal" adult   Osteopenia (low bone mass) T-Score = -1.0 to -2.5 BMD is between 1 and 2.5 SD below that of a "young normal" adult   Osteoporosis T-Score < -2.5 BMD is 2.5 SD or more below that of a "young normal" adult      FRAX* Results:      10-Year Probability of Fracture   Major Osteoporotic Fracture  20.6%% Hip Fracture  5.4%%         Assessment and Plan:  78 y.o. year old female presents today for follow-up of polymyalgia rheumatica.  Patient had significantly elevated inflammatory markers last fall when she was symptomatic from scalp tenderness, headaches,  shoulder and girdle stiffness which resolved with high doses of corticosteroids.  Temporal artery biopsy negative.       It was recommended to start Kevzara due to symptoms being refractory to corticosteroid taper.  Kevzara medication had previously been approved but ultimately patient did not start this medicine.  Will trial another course of slow taper to see how she does.    Oncology note from 09/17/2023 reviewed.      1. Polymyalgia rheumatica (CMS HCC)  - recommend 5 mg daily for 2 weeks and then alternating dose of 5 mg from 4 mg for 1 month.  If patient does well then she can reduce the dose to 4 mg daily until follow up.  If patient has worsening symptoms and she should go back on the dose where she felt well.  - PET scan from 10/16/2023  not reveal any evidence of inflammatory periarticular arthritis    Osteoporosis  - patient counseled that recommend initiation of Prolia.  Reasonable however to obtain a DEXA scan to risk stratify her.  If DEXA is completely unchanged from 2021 then may be all right to monitor however based on her FRAX score from 2021 she would benefit from anti reabsorption medication.      Return in about 3 months (around 06/02/2024) for In Person Visit, PMR.    Feliz Beam, MD

## 2024-03-02 NOTE — Telephone Encounter (Signed)
 Needs iron level checked. Called and spoke with son Jonny Ruiz and advised.

## 2024-03-02 NOTE — Patient Instructions (Signed)
 Recommend prednisone 5 mg daily for 2 weeks, then alternate daily dose between 4 mg and 5 mg to see how you do.

## 2024-03-03 ENCOUNTER — Ambulatory Visit (INDEPENDENT_AMBULATORY_CARE_PROVIDER_SITE_OTHER): Payer: Self-pay | Admitting: Family Medicine

## 2024-03-03 NOTE — Telephone Encounter (Signed)
 Please contact patient and if possible schedule her for telephone encounter for this week for follow-up.  Thank you.

## 2024-03-03 NOTE — Telephone Encounter (Signed)
 Patient called and left a message saying she is still not doing any better and wanted to know if there was anything she could do or if PCP could prescribe something else.  Hal Morales, [place] 03/03/2024 10:10

## 2024-03-04 ENCOUNTER — Encounter (INDEPENDENT_AMBULATORY_CARE_PROVIDER_SITE_OTHER): Payer: Self-pay | Admitting: Emergency Medicine

## 2024-03-04 NOTE — Telephone Encounter (Signed)
 Called and informed patient of doctors notes, patient stated she is feeling better today and is getting most of the mucus out of her system.  Hal Morales, [place] 03/04/2024 10:42

## 2024-03-05 ENCOUNTER — Telehealth (INDEPENDENT_AMBULATORY_CARE_PROVIDER_SITE_OTHER): Payer: Self-pay | Admitting: Emergency Medicine

## 2024-03-05 NOTE — Telephone Encounter (Signed)
 Returned call to her son to reschedule L4/5, L5/S1 RFA( L 03/09/24) to 03/23/24 at 11 arrive at 930, follow up 04/19/24 at 130 with [facility].     Joanna Black

## 2024-03-10 ENCOUNTER — Ambulatory Visit (HOSPITAL_COMMUNITY): Payer: Self-pay | Admitting: Internal Medicine

## 2024-03-15 ENCOUNTER — Ambulatory Visit (INDEPENDENT_AMBULATORY_CARE_PROVIDER_SITE_OTHER): Payer: Self-pay | Admitting: Neurological Surgery

## 2024-03-18 ENCOUNTER — Ambulatory Visit: Attending: Student in an Organized Health Care Education/Training Program

## 2024-03-18 ENCOUNTER — Other Ambulatory Visit: Payer: Self-pay

## 2024-03-18 ENCOUNTER — Other Ambulatory Visit (INDEPENDENT_AMBULATORY_CARE_PROVIDER_SITE_OTHER): Payer: Self-pay | Admitting: Family Medicine

## 2024-03-18 DIAGNOSIS — M353 Polymyalgia rheumatica: Secondary | ICD-10-CM | POA: Insufficient documentation

## 2024-03-18 DIAGNOSIS — F419 Anxiety disorder, unspecified: Secondary | ICD-10-CM

## 2024-03-18 LAB — C-REACTIVE PROTEIN(CRP),INFLAMMATION: CRP INFLAMMATION: 9.5 mg/L — ABNORMAL HIGH (ref <8.0)

## 2024-03-18 MED ORDER — DIAZEPAM 5 MG TABLET
5.0000 mg | ORAL_TABLET | Freq: Once | ORAL | 0 refills | Status: DC | PRN
Start: 2024-03-18 — End: 2024-04-13

## 2024-03-22 ENCOUNTER — Other Ambulatory Visit: Payer: Self-pay

## 2024-03-23 ENCOUNTER — Encounter (HOSPITAL_COMMUNITY): Payer: Self-pay | Admitting: Emergency Medicine

## 2024-03-23 ENCOUNTER — Inpatient Hospital Stay (HOSPITAL_COMMUNITY)
Admission: RE | Admit: 2024-03-23 | Discharge: 2024-03-23 | Disposition: A | Discharge status: Home / Self Care | Source: Ambulatory Visit

## 2024-03-23 ENCOUNTER — Ambulatory Visit
Admission: RE | Admit: 2024-03-23 | Discharge: 2024-03-23 | Disposition: A | Discharge status: Home / Self Care | Payer: Self-pay | Source: Ambulatory Visit | Attending: Emergency Medicine | Admitting: Emergency Medicine

## 2024-03-23 ENCOUNTER — Encounter (HOSPITAL_COMMUNITY)
Admission: RE | Disposition: A | Discharge status: Home / Self Care | Payer: Self-pay | Source: Ambulatory Visit | Attending: Emergency Medicine

## 2024-03-23 ENCOUNTER — Other Ambulatory Visit (HOSPITAL_BASED_OUTPATIENT_CLINIC_OR_DEPARTMENT_OTHER): Payer: Self-pay | Admitting: Emergency Medicine

## 2024-03-23 DIAGNOSIS — M47816 Spondylosis without myelopathy or radiculopathy, lumbar region: Secondary | ICD-10-CM

## 2024-03-23 SURGERY — PAIN SERVICE RHIZOTOMY LUMBAR
Anesthesia: Local (Nurse-Monitored) | Laterality: Left | Wound class: Clean Wound: Uninfected operative wounds in which no inflammation occurred

## 2024-03-23 MED ORDER — LIDOCAINE (PF) 20 MG/ML (2 %) INJECTION SOLUTION
Freq: Once | INTRAMUSCULAR | Status: DC | PRN
Start: 2024-03-23 — End: 2024-03-23
  Administered 2024-03-23: 5 mL via INTRAMUSCULAR

## 2024-03-23 MED ORDER — LIDOCAINE (PF) 10 MG/ML (1 %) INJECTION SOLUTION
Freq: Once | INTRAMUSCULAR | Status: DC | PRN
Start: 2024-03-23 — End: 2024-03-23
  Administered 2024-03-23: 30 mL via INTRAMUSCULAR

## 2024-03-23 SURGICAL SUPPLY — 7 items
APPL ISPRP CHG 10.5ML CHLRPRP HI-LT ORNG PREP STRL LF (MED SURG SUPPLIES) ×1 IMPLANT
GLOVE SURG 8 LF  PF SMOOTH BEAD CUF STRL CRM 11.8IN PROTEXIS PI MICRO PLISPRN THK7.9 MIL MICRO (GLOVES AND ACCESSORIES) ×1 IMPLANT
KIT ELECTROTH COOLIEF ADV CL RF STRL DISP (SURGICAL CUTTING SUPPLIES) ×1 IMPLANT
NEEDLE HYPO  30GA 1IN REG WL PRCSNGL POLYPROP REG BVL LL HUB DEHP-FR TAN STRL LF  DISP (MED SURG SUPPLIES) ×1 IMPLANT
PAD EG CORD DSPR ELECTRODE ADULT RF PAIN MGMT SYS NONST LF  DISP (SURGICAL CUTTING SUPPLIES) ×1 IMPLANT
SYRINGE LL 3ML LF  STRL GRAD N-PYRG DEHP-FR PVC FREE MED DISP CLR (MED SURG SUPPLIES) ×1 IMPLANT
TRAY UNIV NERVE BLOCK DISP SNG USE LF STRL (MED SURG SUPPLIES) ×1 IMPLANT

## 2024-03-23 NOTE — Discharge Instructions (Signed)
 [facility]  AT [facility] - [place], [place] [address] - Phone: [phone])      Complications (Reasons to call):  Temperature greater than 101 degrees  Unusual redness or swelling at the injection site  Persistent nausea or vomiting or headache  Persistent weakness, numbness or bleeding  Loss of bowel or bladder control  If you are unable to reach your physician and your symptoms are severe, have yourself brought to the nearest Emergency Department or call 911    You may experience:  You may also experience a temporary increase in the level of your pain  You may experience weakness, tingling or heavy feelings in your legs the first few hours after your procedure, requiring you to be cautious when ambulating (walking).   Do NOT drive a car or operate heavy machinery for 24 hours after your procedure    Medications:  Most medications held for your procedure may be resumed one day after the procedure. Please ask your physician if you have questions about specific medications or the procedure itself.    Comfort measures:  You may use ice over the injection site  AVOID HEAT for the first 24 hours  After that ice or heat may be used as needed. DO NOT apply LONGER than 20 minutes and wait 20 minutes before reapplication  Avoid sitting in a bathtub, hot tub, pool, etc. for 3 days after your procedure    Activity:  Rest at home for the next 24 hours  Then increase activity as tolerated  Typically it is ok to return to work one day after the procedure

## 2024-03-23 NOTE — OR Surgeon (Signed)
 [facility]  [address]  [place] [address]  [phone]        PATIENT NAME: Joanna Black   CHART WGNFAO:[id number]  DATE OF BIRTH: Feb 25, 1946  DATE OF SERVICE:   03/23/2024  SITE OF SERVICE:  [facility]    Pre Procedure Diagnosis:   Patient Active Problem List   Diagnosis    Type 2 diabetes mellitus with stage 3a chronic kidney disease, without long-term current use of insulin  (CMS HCC)    Cervical pain (neck)    Thoracic back pain    Chronic bilateral low back pain    Essential hypertension    Cardiomyopathy, dilated, nonischemic (CMS HCC)    Chronic GERD    Chronic systolic congestive heart failure (CMS HCC)    BMI 35.0-35.9,adult    Mixed hyperlipidemia    CKD (chronic kidney disease) stage 3, 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/min    History of colon polyps    Colon cancer screening    Gastroesophageal reflux disease    Dysphagia    Schatzki's ring    Chronic diarrhea    Diverticulosis of colon    Hypoxia    Enteropathogenic Escherichia coli infection    Encounter for support and coordination of transition of care    Chronic anemia    Osteoporosis    IDA (iron  deficiency anemia)    Bloating    Belching    CKD (chronic kidney disease) stage 4, GFR 15-29 ml/min (CMS HCC)     Lumbar Spondylosis    Post Procedure Diagnosis: Same    Procedure: Left L4-5 and L5-S1 LUMBAR MEDIAL BRANCH RADIOFREQUENCY ABLATION UNDER FLUOROSCOPY    REASON FOR PROCEDURE: Lumbar arthropathy and pain    Attending:  Serge Dancer, DO    Anesthesia: Local    Estimated Blood Loss: None    Complication: were no complications    MEDICATIONS INJECTED: Before the ablation, 1 mL of 2% Lidocainewas injected at each level. LOCAL ANESTHETIC INJECTED: 2-4 mL of 1% lidocaine  per site    SEDATION MEDICATIONS: None    PROCEDURE:  After informed consent was obtained, patient was taken to the fluoroscopy suite and placed in a prone position.    TIMEOUT:  Correct patient? Yes  Correct site? Yes  Correct side? Yes  Correct position?  Yes  Correct procedure? Yes  Correct medication? Yes  Site marked? Yes  H&P note completed? Yes  Consents verified? Yes  Relevant lab results available? Yes  Allergies reviewed? Yes  Is all required equipment for the procedure available? Yes  Is documentation verified? Yes  Is Time Out verified by doctor and nurse? Yes    The skin was prepped and draped in the usual sterile fashion using chlorhexidine.  Time-out was taken to identify the correct patient, procedure and side prior to starting the procedure. Lying in a prone position, the patient was prepped and draped in the usual sterile fashion using ChloraPrep and a fenestrated drape. The levels were determined under fluoroscopy. Local anesthetic was given by raising a skin wheal and going down to the hub of a 27-gauge 1.25-inch needle. A _[id number] Coolief radiofrequency needle was introduced to the anatomic location of the medial branch at the junction of the superior articular process and transverse process utilizing intermittent fluoroscopy. Motor stimulation up to 2 volts was done to confirm no ablation of the ventral ramus at each level. 1 mL of 2% Lidocaine  was then injected slowly at each level. After waiting 30-60 seconds, ablation  was performed utilizing a radiofrequency generator at 80 degrees C for 150 seconds. After the ablation, the above injectate was given at each level.   Each needle was rinsed with 1cc of 1% Lidocaine  before removal.  The procedure was well tolerated and all needles were removed intact.  Sterile bandage applied at each site.    The patient (or responsible party) was given post-procedure and discharge instructions to follow at home. The patient was discharged in stable condition. A follow-up appointment was made.    Notes:       Patient was observed in recovery and discharged home with written instruction under supervision in stable condition.    Serge Dancer, DO 03/23/2024, 10:22  Division of Pain Medicine

## 2024-03-23 NOTE — OR Nursing (Signed)
[place] Pain Rating Scale     On a scale of 0-10, during the past 24 hours, pain has interfered with you usual activity: 8     On a scale of 0-10, during the past 24 hours, pain has interfered with your sleep: 0    On a scale of 0-10, during the past 24 hours, pain has affected your mood: 8     On a scale of 0-10, during the past 24 hours, pain has contributed to your stress: 8     On a scale of 0-10, what is your overall pain Rating: 6

## 2024-03-23 NOTE — OR Nursing (Signed)
 Medications provided to sterile field. See MD notes for amounts administered.

## 2024-03-23 NOTE — Nurses Notes (Signed)
 80% relief reported by patient post procedure.  Michalene Agee, RN  03/23/2024, 11:24

## 2024-03-23 NOTE — OR PostOp (Signed)
 [facility] OVERALL PAIN RATING POST PROCEDURE    Pt rates pain 2/10

## 2024-03-24 ENCOUNTER — Ambulatory Visit (HOSPITAL_COMMUNITY): Payer: Self-pay | Admitting: Internal Medicine

## 2024-03-24 ENCOUNTER — Telehealth (HOSPITAL_BASED_OUTPATIENT_CLINIC_OR_DEPARTMENT_OTHER): Payer: Self-pay | Admitting: Emergency Medicine

## 2024-03-24 NOTE — Telephone Encounter (Signed)
 Called to see how patient was doing after their LEFT LUMBAR RFA injection on 03/23/24 with Dr. Katheryne Pane, and what percent of relief they had. No answer, left message to return our call  Vaibhav Fogleman, [place]

## 2024-03-25 ENCOUNTER — Ambulatory Visit (INDEPENDENT_AMBULATORY_CARE_PROVIDER_SITE_OTHER): Payer: Self-pay | Admitting: Student in an Organized Health Care Education/Training Program

## 2024-03-26 ENCOUNTER — Ambulatory Visit (INDEPENDENT_AMBULATORY_CARE_PROVIDER_SITE_OTHER): Payer: Self-pay | Admitting: Family Medicine

## 2024-03-26 DIAGNOSIS — Z1231 Encounter for screening mammogram for malignant neoplasm of breast: Secondary | ICD-10-CM

## 2024-03-26 NOTE — Telephone Encounter (Signed)
 Pt called wanting to know if you could place a order for her to have a mammo at [place].   Levis Reams, MA

## 2024-03-26 NOTE — Telephone Encounter (Signed)
Notified  Kalina Morabito, MA

## 2024-03-26 NOTE — Telephone Encounter (Signed)
Order placed for mammogram.  Thanks

## 2024-03-29 ENCOUNTER — Ambulatory Visit (INDEPENDENT_AMBULATORY_CARE_PROVIDER_SITE_OTHER): Payer: Self-pay | Admitting: Neurological Surgery

## 2024-04-06 ENCOUNTER — Encounter (INDEPENDENT_AMBULATORY_CARE_PROVIDER_SITE_OTHER): Payer: Self-pay | Admitting: Student in an Organized Health Care Education/Training Program

## 2024-04-06 ENCOUNTER — Other Ambulatory Visit (INDEPENDENT_AMBULATORY_CARE_PROVIDER_SITE_OTHER): Payer: Self-pay | Admitting: Family Medicine

## 2024-04-06 DIAGNOSIS — M353 Polymyalgia rheumatica: Secondary | ICD-10-CM

## 2024-04-06 DIAGNOSIS — M62838 Other muscle spasm: Secondary | ICD-10-CM

## 2024-04-06 MED ORDER — PREDNISONE 1 MG TABLET
ORAL_TABLET | ORAL | 1 refills | Status: DC
Start: 2024-04-06 — End: 2024-06-04

## 2024-04-06 MED ORDER — TIZANIDINE 4 MG TABLET
ORAL_TABLET | ORAL | 1 refills | Status: DC
Start: 2024-04-06 — End: 2024-11-03

## 2024-04-06 MED ORDER — PREDNISONE 5 MG TABLET
ORAL_TABLET | ORAL | 1 refills | Status: DC
Start: 2024-04-06 — End: 2024-06-04

## 2024-04-07 ENCOUNTER — Ambulatory Visit (INDEPENDENT_AMBULATORY_CARE_PROVIDER_SITE_OTHER): Payer: Self-pay | Admitting: Family Medicine

## 2024-04-08 ENCOUNTER — Other Ambulatory Visit: Payer: Self-pay

## 2024-04-08 ENCOUNTER — Encounter (HOSPITAL_BASED_OUTPATIENT_CLINIC_OR_DEPARTMENT_OTHER): Payer: Self-pay | Admitting: Nephrology

## 2024-04-08 ENCOUNTER — Ambulatory Visit: Payer: Self-pay | Attending: Nephrology | Admitting: Nephrology

## 2024-04-08 VITALS — BP 150/80 | HR 69 | Temp 97.5°F | Ht 64.0 in | Wt 213.7 lb

## 2024-04-08 DIAGNOSIS — E119 Type 2 diabetes mellitus without complications: Secondary | ICD-10-CM | POA: Insufficient documentation

## 2024-04-08 DIAGNOSIS — E1122 Type 2 diabetes mellitus with diabetic chronic kidney disease: Secondary | ICD-10-CM | POA: Insufficient documentation

## 2024-04-08 DIAGNOSIS — I10 Essential (primary) hypertension: Secondary | ICD-10-CM | POA: Insufficient documentation

## 2024-04-08 DIAGNOSIS — Z7985 Long-term (current) use of injectable non-insulin antidiabetic drugs: Secondary | ICD-10-CM

## 2024-04-08 DIAGNOSIS — N1832 Chronic kidney disease, stage 3b: Secondary | ICD-10-CM | POA: Insufficient documentation

## 2024-04-08 NOTE — Progress Notes (Signed)
 NEPHROLOGY, PHYSICAN OFFICE BUILDING  [address]  [name] [facility] [address]  Operated by [facility]  Progress Note    Name: Joanna Black MRN:  [id number]   Date: 04/08/2024 DOB:  09/27/1946 (78 y.o.)           Chief Complaint: No chief complaint on file.    Subjective:   HAS PMR ON PREDNISONE 5 MG,AND NAPROSYN 500 MG PRN,DIABETES UNDER CONTROL,LABS REVIEWED WITH PATIENT  Objective :  BP (!) 150/80   Pulse 69   Temp 36.4 C (97.5 F)   Ht 1.626 m (5\' 4" )   Wt 97 kg (213 lb 11.8 oz)   SpO2 95%   BMI 36.69 kg/m       ALERT IN NO DISTRESS  HEART REGULAR S1 S2  LUNGS CLEAR  EXT NO PEDAL EDEMA  Data reviewed:CREAT 1.36,GFR 40    Current Outpatient Medications   Medication Sig    acetaminophen (TYLENOL) 500 mg Oral Tablet Take 1 Tablet (500 mg total) by mouth Every night as needed Takes 2 at night    albuterol sulfate (PROVENTIL OR VENTOLIN OR PROAIR) 90 mcg/actuation Inhalation oral inhaler Take 1-2 Puffs by inhalation Every 6 hours as needed    albuterol sulfate (PROVENTIL) 2.5 mg /3 mL (0.083 %) Inhalation nebulizer solution Take 3 mL (2.5 mg total) by nebulization Three times a day as needed for Wheezing    aspirin (ECOTRIN) 81 mg Oral Tablet, Delayed Release (E.C.) Take 1 Tablet (81 mg total) by mouth Once a day (Patient not taking: Reported on 03/23/2024)    calcium carbonate/vitamin D3 (VITAMIN D-3 ORAL) Take 2,000 Int'l Units/day by mouth Daily    conjugated estrogens (PREMARIN) 0.625 mg/gram Vaginal Cream Insert 0.5 g into the vagina Every night    diazePAM (VALIUM) 5 mg Oral Tablet Take 1 Tablet (5 mg total) by mouth Once, as needed for Anxiety for up to 1 dose    ezetimibe (ZETIA) 10 mg Oral Tablet TAKE 1 TABLET BY MOUTH EVERY DAY IN THE EVENING    FLUoxetine (PROZAC) 20 mg Oral Capsule Take 1 Capsule (20 mg total) by mouth Once a day    furosemide (LASIX) 20 mg Oral Tablet TAKE 1 TABLET (20 MG TOTAL) BY MOUTH TWICE PER DAY AS NEEDED    magnesium oxide (MAG-OX) 400 mg Oral Tablet Take 1  Tablet (400 mg total) by mouth Three times a day (Patient not taking: Reported on 03/23/2024)    metoprolol succinate (TOPROL-XL) 25 mg Oral Tablet Sustained Release 24 hr Take 1 Tablet (25 mg total) by mouth Every night    naproxen (NAPROSYN) 500 mg Oral Tablet Take 1 Tablet (500 mg total) by mouth Twice daily with food    naproxen (NAPROSYN) 500 mg Oral Tablet Take 1 Tablet (500 mg total) by mouth Twice daily with food    omeprazole (PRILOSEC) 40 mg Oral Capsule, Delayed Release(E.C.) Take 1 Capsule (40 mg total) by mouth Once a day Indications: gastroesophageal reflux disease    predniSONE (DELTASONE) 1 mg Oral Tablet Take 10 mg daily for 1 week, then reduce dose to 8 mg daily for 1 week, then reduce dose to 6 mg daily for 4 weeks, then 5 mg daily for 4 weeks (use 1 mg tablets in combination with 5 mg tablets)    predniSONE (DELTASONE) 5 mg Oral Tablet Take 10 mg daily for 1 week, then reduce dose to 8 mg daily for 1 week, then reduce dose to 6 mg daily for  4 weeks, then 5 mg daily for 4 weeks (use 5 mg tablets in combination with 1 mg tablets)    tiZANidine (ZANAFLEX) 4 mg Oral Tablet TAKE 1 TABLET BY MOUTH THREE TIMES A DAY AS NEEDED FOR MUSCLE CRAMPS    TRULICITY 0.75 mg/0.5 mL Subcutaneous Pen Injector INJECT THE CONTENTS OF ONE PEN  SUBCUTANEOUSLY WEEKLY AS  DIRECTED     Assessment/Plan  Problem List Items Addressed This Visit    None  Visit Diagnoses         Type 2 diabetes mellitus with stage 3b chronic kidney disease, without long-term current use of insulin (CMS HCC)    -  Primary    Relevant Orders    BASIC METABOLIC PANEL      Diabetes mellitus        Relevant Orders    BASIC METABOLIC PANEL    BASIC METABOLIC PANEL          DIABETIC NEPHROPATHY  CKD-3 B   DM II  MINIMIZE TO AVOID NSAID  BMP BEFORE NEXT VISIT    Daymon Evans, MD

## 2024-04-12 ENCOUNTER — Ambulatory Visit (INDEPENDENT_AMBULATORY_CARE_PROVIDER_SITE_OTHER): Payer: Self-pay | Admitting: Family Medicine

## 2024-04-12 NOTE — Progress Notes (Unsigned)
 [facility]  [address]  [place], [place] [address]  P: [phone]  F: [phone]       NAME:  Joanna Black  DOB:  12/21/1946  AGE:  78 y.o.  MRN:  [id number]  APPT:  04/13/2024     No chief complaint on file.       Subjective:     This is a case of a 78 y.o. year old female who comes in today for ***    Past Medical History:   Diagnosis Date    Arthritis     Asthma     Back problem     BMI 35.0-35.9,adult 02/16/2020    CAD (coronary artery disease)     mild    Cataract     Cataracts, bilateral     CHF (congestive heart failure)     Chronic bronchitis with emphysema (CMS HCC)     Chronic low back pain     Claustrophobia     Depression     Diabetes     Diabetes mellitus, type 2     Encounter for support and coordination of transition of care 11/16/2022    Esophageal reflux     Essential hypertension 01/28/2019    H/O urinary tract infection     HH (hiatus hernia)     History of kidney disease     states, "low kidney functions."    Hypercholesterolemia     Hypertension     Hypertriglyceridemia     Type 2 diabetes mellitus          Past Surgical History:   Procedure Laterality Date    (10AM) CAUDAL EPIDURAL INJECTION  **DIABETIC** N/A 09/23/2023    Performed by Serge Dancer, DO at [facility] OR MAIN    (1145AM) #1 BILATERAL L4/5, L5/S1 LUMBAR MEDIAL BRANCH BLOCK **DIABETIC** **GIVE 1MG  ATIVAN ON ARRIVAL** Bilateral 01/27/2024    Performed by Serge Dancer, DO at [facility] OR MAIN    (1145AM) #1 BILATERAL L4/5, L5/S1 LUMBAR MEDIAL BRANCH BLOCK **DIABETIC** **GIVE 1MG  ATIVAN ON ARRIVAL** Bilateral 01/27/2024    Performed by Serge Dancer, DO at [facility] OR MAIN    (1145AM) #2 BILATERAL L4/5, L5/S1 LUMBAR MEDIAL BRANCH BLOCK **DIABETIC** **GIVE 1MG  ATIVAN ON ARRIVAL** Bilateral 02/10/2024    Performed by Serge Dancer, DO at [facility] OR MAIN    (1145AM) LEFT L4/5, L5/S1 LUMBAR MEDIAL BRANCH NERVE RADILFREQUENCY ABLATION **DIABETIC** **GIVE 1MG  ATIVAN ON ARRIVAL** Left 03/23/2024    Performed by Serge Dancer, DO at [facility] OR MAIN     (1145AM) LEFT L4/5, L5/S1 LUMBAR MEDIAL BRANCH NERVE RADILFREQUENCY ABLATION **DIABETIC** **GIVE 1MG  ATIVAN ON ARRIVAL** Left 03/23/2024    Performed by Serge Dancer, DO at [facility] OR MAIN    (1145AM) RIGHT L4/5, L5/S1 LUMBAR MEDIAL BRANCH NERVE RADILFREQUENCY ABLATION **DIABETIC** **GIVE 1MG  ATIVAN ON ARRIVAL** Right 02/24/2024    Performed by Serge Dancer, DO at [facility] OR MAIN    (1145AM) RIGHT L4/5, L5/S1 LUMBAR MEDIAL BRANCH NERVE RADILFREQUENCY ABLATION **DIABETIC** **GIVE 1MG  ATIVAN ON ARRIVAL** Right 02/24/2024    Performed by Serge Dancer, DO at [facility] OR MAIN    CATARACT EXTRACTION, BILATERAL Bilateral     bilateral lens implant    COLONOSCOPY      CYSTOSCOPY N/A 05/26/2023    Performed by Hermine Loots, MD at [facility] OR MAIN    ENDOSCOPIC U/S UPPER with FNA N/A 04/15/2023    Performed by Rico Charters, DO at [facility] OR ENDO    ENDOSCOPIC  U/S UPPER with FNA N/A 05/10/2022    Performed by Rico Charters, DO at [facility] OR ENDO    ENDOSCOPIC ULTRASOUND ESOPHGEAL  04/15/2023    ESOPHAGOGASTRODUODENOSCOPY      GASTROSCOPY  05/10/2022    Performed by Rico Charters, DO at [facility] OR ENDO    GASTROSCOPY WITH DILATION Left 09/24/2022    Performed by Charmaine Coop, MD at [facility] OR ENDO    GASTROSCOPY WITH DILATION Left 06/05/2022    Performed by Charmaine Coop, MD at [facility] OR ENDO    GASTROSCOPY WITH DILATION and bxs Left 05/07/2022    Performed by Charmaine Coop, MD at [facility] OR ENDO    HX CARPAL TUNNEL RELEASE      HX CATARACT REMOVAL Right 12/30/2019    HX CATARACT REMOVAL Left 01/06/2020    HX CHOLECYSTECTOMY      HX EXPOSURE TO METAL SHAVINGS      HX HEART CATHETERIZATION      HX HYSTERECTOMY      HX OOPHORECTOMY      HX TAH AND BSO      HX TUBAL LIGATION      HX VEIN STRIPPING      Left leg    PHACO WITH INTRAOCULAR LENS Left 01/06/2020    Performed by Lin Rend, MD at [facility] OR MAIN    PHACO WITH INTRAOCULAR LENS Right 12/30/2019    Performed by Lin Rend, MD at [facility] OR MAIN     Family Medical History:       Problem  Relation (Age of Onset)    Bone cancer Father    Diabetes Multiple family members    Hypertension (High Blood Pressure) Multiple family members    No Known Problems Mother    Stomach Cancer Paternal Grandmother            Social History     Socioeconomic History    Marital status: Married     Spouse name: Theodis Fiscal    Number of children: 3    Years of education: 13    Highest education level: High school graduate   Occupational History    Occupation: Retired   Tobacco Use    Smoking status: Never     Passive exposure: Never    Smokeless tobacco: Never   Vaping Use    Vaping status: Never Used   Substance and Sexual Activity    Alcohol use: No     Alcohol/week: 0.0 standard drinks of alcohol    Drug use: No    Sexual activity: Not Currently     Partners: Male   Other Topics Concern    Right hand dominant Yes    Ability to Walk 1 Flight of Steps without SOB/CP No    Ability To Do Own ADL's Yes   Social History Narrative    MARRIED.  Lives in single story home.  Heats home with electric and has well water .        April Haney, LPN  1/61/0960, 14:01     Social Determinants of Health     Financial Resource Strain: Low Risk  (04/17/2023)    Financial Resource Strain     SDOH Financial: No   Transportation Needs: Low Risk  (04/17/2023)    Transportation Needs     SDOH Transportation: No   Social Connections: Low Risk  (04/17/2023)    Social Connections     SDOH Social Isolation: 5 or more times a week   Intimate Partner Violence: Low Risk  (  04/17/2023)    Intimate Partner Violence     SDOH Domestic Violence: No   Housing Stability: Low Risk  (04/17/2023)    Housing Stability     SDOH Housing Situation: I have housing.     SDOH Housing Worry: No     Current Outpatient Medications   Medication Sig    acetaminophen  (TYLENOL ) 500 mg Oral Tablet Take 1 Tablet (500 mg total) by mouth Every night as needed Takes 2 at night    albuterol  sulfate (PROVENTIL  OR VENTOLIN  OR PROAIR ) 90 mcg/actuation Inhalation oral inhaler Take 1-2 Puffs by  inhalation Every 6 hours as needed    albuterol  sulfate (PROVENTIL ) 2.5 mg /3 mL (0.083 %) Inhalation nebulizer solution Take 3 mL (2.5 mg total) by nebulization Three times a day as needed for Wheezing    aspirin  (ECOTRIN) 81 mg Oral Tablet, Delayed Release (E.C.) Take 1 Tablet (81 mg total) by mouth Once a day (Patient not taking: Reported on 03/23/2024)    calcium carbonate/vitamin D3 (VITAMIN D -3 ORAL) Take 2,000 Int'l Units/day by mouth Daily    conjugated estrogens  (PREMARIN ) 0.625 mg/gram Vaginal Cream Insert 0.5 g into the vagina Every night    diazePAM  (VALIUM ) 5 mg Oral Tablet Take 1 Tablet (5 mg total) by mouth Once, as needed for Anxiety for up to 1 dose    ezetimibe  (ZETIA ) 10 mg Oral Tablet TAKE 1 TABLET BY MOUTH EVERY DAY IN THE EVENING    FLUoxetine  (PROZAC ) 20 mg Oral Capsule Take 1 Capsule (20 mg total) by mouth Once a day    furosemide  (LASIX ) 20 mg Oral Tablet TAKE 1 TABLET (20 MG TOTAL) BY MOUTH TWICE PER DAY AS NEEDED    magnesium  oxide (MAG-OX) 400 mg Oral Tablet Take 1 Tablet (400 mg total) by mouth Three times a day (Patient not taking: Reported on 03/23/2024)    metoprolol  succinate (TOPROL -XL) 25 mg Oral Tablet Sustained Release 24 hr Take 1 Tablet (25 mg total) by mouth Every night    naproxen  (NAPROSYN ) 500 mg Oral Tablet Take 1 Tablet (500 mg total) by mouth Twice daily with food    naproxen  (NAPROSYN ) 500 mg Oral Tablet Take 1 Tablet (500 mg total) by mouth Twice daily with food    omeprazole  (PRILOSEC) 40 mg Oral Capsule, Delayed Release(E.C.) Take 1 Capsule (40 mg total) by mouth Once a day Indications: gastroesophageal reflux disease    predniSONE  (DELTASONE ) 1 mg Oral Tablet Take 10 mg daily for 1 week, then reduce dose to 8 mg daily for 1 week, then reduce dose to 6 mg daily for 4 weeks, then 5 mg daily for 4 weeks (use 1 mg tablets in combination with 5 mg tablets)    predniSONE  (DELTASONE ) 5 mg Oral Tablet Take 10 mg daily for 1 week, then reduce dose to 8 mg daily for 1 week, then  reduce dose to 6 mg daily for 4 weeks, then 5 mg daily for 4 weeks (use 5 mg tablets in combination with 1 mg tablets)    tiZANidine  (ZANAFLEX ) 4 mg Oral Tablet TAKE 1 TABLET BY MOUTH THREE TIMES A DAY AS NEEDED FOR MUSCLE CRAMPS    TRULICITY  0.75 mg/0.5 mL Subcutaneous Pen Injector INJECT THE CONTENTS OF ONE PEN  SUBCUTANEOUSLY WEEKLY AS  DIRECTED     Review of Systems  Objective:   There were no vitals taken for this visit.      Physical Exam    PHQ Questionnaire       Assessment/Plan  There are no diagnoses linked to this encounter.  {bmigant:32679}  No orders of the defined types were placed in this encounter.                Health Maintenance Due   Topic Date Due    Adult Tdap-Td (1 - Tdap) Never done    RSV Adult 60+ or Pregnancy (1 - 1-dose 75+ series) Never done    Diabetic Retinal Exam  10/25/2021    Osteoporosis screening  12/05/2022    Covid-19 Vaccine (5 - 2024-25 season) 08/24/2023    Diabetic A1C  02/18/2024       Controlled Substances Tracking      Follow up: No follow-ups on file.    The patient was given ample opportunity to ask questions and those questions were answered to the patient's satisfaction. The patient was encouraged to be involved in their own care, and all diagnoses, medications, and medication side-effects were discussed. The patient was told to contact me with any additional questions or concerns, or go to the ED in the case of an emergency.       Tamme Mozingo, DO

## 2024-04-13 ENCOUNTER — Other Ambulatory Visit: Payer: Self-pay

## 2024-04-13 ENCOUNTER — Encounter (INDEPENDENT_AMBULATORY_CARE_PROVIDER_SITE_OTHER): Payer: Self-pay | Admitting: Family Medicine

## 2024-04-13 ENCOUNTER — Ambulatory Visit: Payer: Self-pay | Attending: Family Medicine | Admitting: Family Medicine

## 2024-04-13 VITALS — BP 138/80 | HR 58 | Temp 97.7°F | Resp 18 | Ht 64.0 in | Wt 211.0 lb

## 2024-04-13 DIAGNOSIS — E1122 Type 2 diabetes mellitus with diabetic chronic kidney disease: Secondary | ICD-10-CM | POA: Insufficient documentation

## 2024-04-13 DIAGNOSIS — E1169 Type 2 diabetes mellitus with other specified complication: Secondary | ICD-10-CM | POA: Insufficient documentation

## 2024-04-13 DIAGNOSIS — Z6836 Body mass index (BMI) 36.0-36.9, adult: Secondary | ICD-10-CM | POA: Insufficient documentation

## 2024-04-13 DIAGNOSIS — E1159 Type 2 diabetes mellitus with other circulatory complications: Secondary | ICD-10-CM | POA: Insufficient documentation

## 2024-04-13 DIAGNOSIS — N183 Chronic kidney disease, stage 3 unspecified: Secondary | ICD-10-CM | POA: Insufficient documentation

## 2024-04-13 DIAGNOSIS — Z7985 Long-term (current) use of injectable non-insulin antidiabetic drugs: Secondary | ICD-10-CM | POA: Insufficient documentation

## 2024-04-13 DIAGNOSIS — Z79899 Other long term (current) drug therapy: Secondary | ICD-10-CM | POA: Insufficient documentation

## 2024-04-13 DIAGNOSIS — I152 Hypertension secondary to endocrine disorders: Secondary | ICD-10-CM | POA: Insufficient documentation

## 2024-04-13 DIAGNOSIS — E785 Hyperlipidemia, unspecified: Secondary | ICD-10-CM | POA: Insufficient documentation

## 2024-04-13 DIAGNOSIS — E119 Type 2 diabetes mellitus without complications: Secondary | ICD-10-CM | POA: Insufficient documentation

## 2024-04-14 ENCOUNTER — Telehealth (HOSPITAL_COMMUNITY): Payer: Self-pay | Admitting: Internal Medicine

## 2024-04-14 ENCOUNTER — Telehealth (HOSPITAL_BASED_OUTPATIENT_CLINIC_OR_DEPARTMENT_OTHER): Payer: Self-pay | Admitting: Nephrology

## 2024-04-14 NOTE — Telephone Encounter (Signed)
 Joanna Black called  Pt has appt on 4/30 and is coming from 1.5 hours away, pt has no transportation and wants to know if you can do a telemed instead

## 2024-04-14 NOTE — Telephone Encounter (Signed)
 [name] Ruta, Belkis's son called asking for the visit on October 16,2025 to be changed to a telephone visit because of it being an hour and a half to drive to the appt. I messaged Dr. Ghabra and he approved it to be changed to a telephone visit. I called back to let the patient's son know this was approved and he asked that the home number be called for the visit.

## 2024-04-15 ENCOUNTER — Other Ambulatory Visit (HOSPITAL_COMMUNITY): Payer: Self-pay

## 2024-04-15 ENCOUNTER — Ambulatory Visit (INDEPENDENT_AMBULATORY_CARE_PROVIDER_SITE_OTHER): Payer: Medicare PPO | Admitting: NURSE PRACTITIONER

## 2024-04-15 DIAGNOSIS — D472 Monoclonal gammopathy: Secondary | ICD-10-CM

## 2024-04-15 DIAGNOSIS — D649 Anemia, unspecified: Secondary | ICD-10-CM

## 2024-04-15 DIAGNOSIS — E8809 Other disorders of plasma-protein metabolism, not elsewhere classified: Secondary | ICD-10-CM

## 2024-04-15 NOTE — Progress Notes (Signed)
 PAIN MANAGEMENT, [facility] OFFICE BUILDING  [address]  [place] [address]  Operated by [facility]    Name: Joanna Black MRN:  [id number]   Date: 04/19/2024 DOB:  30-Mar-1946 (78 y.o.)         CC: Follow Up After Injection     Obtained history from: patient    SUBJECTIVE:  Joanna Black is a 78 y.o. female  who RETURNS to Pain Management for follow up evaluation.  Last seen 3/4 and 03/23/24 for Lumbar RFAs per Dr. Katheryne Pane.  She reports 70% relief on the right which is ongoing, and no relief of left low back pain.  Continues to have pain in left low back, hip, and lateral LLE.  Rates 9/10, describes as aching, burning, worse with walking.  Pain improves with sitting and rest. States follows with Dr. Wriston, rheumatology, for polymyalgia rheumatica and giant cell arteritis.  Also follows with Dr. 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  As previously documented, patient has participated in conservative treatment in the past such as physical therapy and medication management without adequate relief.  Continues to have moderate to severe pain which does interfere with daily activities and sleep.    Procedures:  09/23/23 Caudal ESI with catheter- No relief  02/24/24 Right L4-5, L5-S1 RFA- 70% relief ongoing  03/23/24 Left L4-5, L5-S1 RFA- No relief    Patient's past history and current information (including medical, surgical, family, social, allergy, medication) were reviewed personally today.     REVIEW OF SYSTEMS:  Other than ROS in the HPI, all other review of systems were negative except: bladder leakage (ongoing), recent URI and antibiotic, DM,  and visual changes. Denies new numbness/weakness, bowel incontinence, saddle anesthesia.    PHYSICAL EXAMINATION:    Vitals: BP (!) 147/55   Pulse 81   Temp 36.3 C (97.4 F)   Ht 1.626 m (5\' 4" )   Wt 96.5 kg (212 lb 11.9 oz)   SpO2 94%   BMI 36.52 kg/m       GENERAL EXAMINATION:  resting in seated position. No acute distress.   SKIN:  warm  and dry  EYES:  Conjunctiva clear  HEAD/EARS/NOSE/THROAT:   normocephalic, atraumatic  NECK:   symmetric, trachea midline  CHEST WALL AND LUNGS:   Unlabored breathing, symmetrical chest excursions.  PSYCHOLOGICAL:  Affect normal. Behavior appropriate.  NEUROLOGICAL/MUSCULOSKELETAL:  Alert and oriented x3,  memory grossly intact  Visual Inspection: No appreciable muscular atrophy  Motor is 5/5 across all joints of the BLE  Sensation: normal  Coordination: Normal  Gait:Nonantalgic with use of no assistive device  Palpation: TTP: lumbar paraspinals and mildly at left gluteal region  SLR:  Positive bilaterally  Compression testing: mildly positive over Left SIJ  FABERs: Positive on left      RADIOLOGICAL/ LAB FINDINGS:    Lab A1C Results:  HEMOGLOBIN A1C   Date Value Ref Range Status   08/18/2023 7.1 (H) 4.8 - 6.2 % Final   01/23/2023 6.4 (H) 4.8 - 6.2 % Final   12/10/2022 7.6  Final   03/13/2022 5.9  Final   11/29/2021 6.3 (H) 4.8 - 6.2 % Final       POCT A1C Results:      03/13/2022    11:00 AM 10/03/2022     4:00 PM 12/10/2022     9:00 AM   POCT A1c   Time Performed   09:27   A1C 5.9 6.3 7.6  MRI SPINE LUMBOSACRAL WO CONTRAST     Collection Time: 05/21/23  1:53 PM     Narrative     Joanna Black     PROCEDURE DESCRIPTION: MRI SPINE LUMBOSACRAL WO CONTRAST     CLINICAL INDICATION: R26.2: Ambulatory dysfunction     COMPARISON: No prior studies were compared.        FINDINGS: Multiplanar multisequence images lumbar spine obtained without IV contrast enhancement. There is grade 1 anterolisthesis L4 on L5. There is no bone edema. There is disc desiccation at multiple levels throughout the lumbar spine. Mild disc space narrowing is seen. Spinal cord is normal with conus terminating at L1.     There is mild disc bulging L1-2 but no significant spinal stenosis.     Mild disc bulging and degenerative facet arthropathy L2-3 without significant spinal stenosis.     Degenerative facet changes and mild disc bulging L3-4  with mild central canal stenosis and foraminal narrowing.     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     Degenerative facet changes L5-S1 with disc bulging more prominent towards the left side. There is a central disc protrusion or herniation superimposed. There is moderate left foraminal narrowing. There is mild central canal stenosis.     Paraspinal soft tissues appear normal.     No findings to suggest infectious or inflammatory process of the lumbar spine.        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 Katheryne Pane at [place].  Schedule after 05/23/24.  Request updated A1c from PCP, Dr. Arneta Lango.    Follows with:       Rheumatology, Dr. Chandler Combs, Hx; polymyalgia rheumatica and giant cell arteritis     Dr. 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    Blood thinner: ASA 81 mg. (States no longer taking)       Consider:  Left L4-5, L5-S1 TFESIs  Left SIJ injection    Our impression, treatment recommendations and plan from today's visit were reviewed in detail with the patient in the office. All of the patient's questions were answered. The procedure was explained using a spine model,  including technique, benefits, alternatives and the associated risks including but not limited to pain at the injection site, infection, bleeding, nerve damage, reactions to medications administered, and failure of the pain to improve.  The patient verbalized understanding and  wishes to move forward with the above noted plan.    Follow up:  Return 3 weeks after injection.    Patient was seen independently.    Alvena Aurora, APRN,FNP-BC

## 2024-04-19 ENCOUNTER — Ambulatory Visit

## 2024-04-19 ENCOUNTER — Encounter (INDEPENDENT_AMBULATORY_CARE_PROVIDER_SITE_OTHER): Payer: Self-pay | Admitting: Neurological Surgery

## 2024-04-19 ENCOUNTER — Ambulatory Visit: Payer: Self-pay | Attending: Neurological Surgery | Admitting: Neurological Surgery

## 2024-04-19 ENCOUNTER — Ambulatory Visit (INDEPENDENT_AMBULATORY_CARE_PROVIDER_SITE_OTHER): Payer: Self-pay | Admitting: Family Medicine

## 2024-04-19 ENCOUNTER — Other Ambulatory Visit: Payer: Self-pay

## 2024-04-19 VITALS — BP 147/55 | HR 81 | Temp 97.4°F | Ht 64.0 in | Wt 212.7 lb

## 2024-04-19 DIAGNOSIS — M4726 Other spondylosis with radiculopathy, lumbar region: Secondary | ICD-10-CM

## 2024-04-19 DIAGNOSIS — J019 Acute sinusitis, unspecified: Secondary | ICD-10-CM

## 2024-04-19 DIAGNOSIS — M5416 Radiculopathy, lumbar region: Secondary | ICD-10-CM | POA: Insufficient documentation

## 2024-04-19 DIAGNOSIS — D472 Monoclonal gammopathy: Secondary | ICD-10-CM | POA: Insufficient documentation

## 2024-04-19 DIAGNOSIS — D649 Anemia, unspecified: Secondary | ICD-10-CM | POA: Insufficient documentation

## 2024-04-19 DIAGNOSIS — M47816 Spondylosis without myelopathy or radiculopathy, lumbar region: Secondary | ICD-10-CM | POA: Insufficient documentation

## 2024-04-19 DIAGNOSIS — E8809 Other disorders of plasma-protein metabolism, not elsewhere classified: Secondary | ICD-10-CM | POA: Insufficient documentation

## 2024-04-19 DIAGNOSIS — M48061 Spinal stenosis, lumbar region without neurogenic claudication: Secondary | ICD-10-CM | POA: Insufficient documentation

## 2024-04-19 DIAGNOSIS — M4316 Spondylolisthesis, lumbar region: Secondary | ICD-10-CM | POA: Insufficient documentation

## 2024-04-19 LAB — CBC WITH DIFF
BASOPHIL #: <0.1 10*3/uL (ref <=0.20)
BASOPHIL %: 0.6 %
EOSINOPHIL #: 0.34 10*3/uL (ref <=0.50)
EOSINOPHIL %: 2.7 %
HCT: 45.1 % (ref 34.8–46.0)
HGB: 14 g/dL (ref 11.5–16.0)
IMMATURE GRANULOCYTE #: <0.1 10*3/uL (ref <0.10)
IMMATURE GRANULOCYTE %: 0.5 % (ref 0.0–1.0)
LYMPHOCYTE #: 2.79 10*3/uL (ref 1.00–4.80)
LYMPHOCYTE %: 21.7 %
MCH: 29.8 pg (ref 26.0–32.0)
MCHC: 31 g/dL (ref 31.0–35.5)
MCV: 96 fL (ref 78.0–100.0)
MONOCYTE #: 0.98 10*3/uL (ref 0.20–1.10)
MONOCYTE %: 7.6 %
MPV: 11.2 fL (ref 8.7–12.5)
NEUTROPHIL #: 8.58 10*3/uL — ABNORMAL HIGH (ref 1.50–7.70)
NEUTROPHIL %: 66.9 %
PLATELETS: 301 10*3/uL (ref 150–400)
RBC: 4.7 10*6/uL (ref 3.85–5.22)
RDW-CV: 15.4 % (ref 11.5–15.5)
WBC: 12.8 10*3/uL — ABNORMAL HIGH (ref 3.7–11.0)

## 2024-04-19 LAB — COMPREHENSIVE METABOLIC PANEL, NON-FASTING
ALBUMIN: 3.5 g/dL (ref 3.4–4.8)
ALKALINE PHOSPHATASE: 93 U/L (ref 55–145)
ALT (SGPT): 11 U/L (ref <31)
ANION GAP: 10 mmol/L (ref 4–13)
AST (SGOT): 16 U/L (ref 11–34)
BILIRUBIN TOTAL: 0.5 mg/dL (ref 0.3–1.3)
BUN/CREA RATIO: 19 (ref 6–22)
BUN: 27 mg/dL — ABNORMAL HIGH (ref 8–25)
CALCIUM: 9.2 mg/dL (ref 8.6–10.3)
CHLORIDE: 100 mmol/L (ref 96–111)
CO2 TOTAL: 30 mmol/L (ref 23–31)
CREATININE: 1.39 mg/dL — ABNORMAL HIGH (ref 0.60–1.05)
ESTIMATED GFR - FEMALE: 39 mL/min/BSA — ABNORMAL LOW (ref >=60)
GLUCOSE: 173 mg/dL — ABNORMAL HIGH (ref 65–125)
POTASSIUM: 3.9 mmol/L (ref 3.5–5.1)
PROTEIN TOTAL: 7.2 g/dL (ref 6.0–8.0)
SODIUM: 140 mmol/L (ref 136–145)

## 2024-04-19 LAB — PROTEIN FOR ELECTROPHORESIS: PROTEIN TOTAL: 6.6 g/dL (ref 5.6–7.6)

## 2024-04-19 LAB — ALBUMIN FOR ELECTROPHORESIS: ALBUMIN: 3.4 g/dL (ref 3.4–4.8)

## 2024-04-19 MED ORDER — AZITHROMYCIN 250 MG TABLET
ORAL_TABLET | ORAL | 0 refills | Status: DC
Start: 2024-04-19 — End: 2024-05-24

## 2024-04-19 NOTE — Telephone Encounter (Signed)
 Patient called and left a message asking if PCP could call in an antibiotic for sinus infection. She is having drainage and coughing up green stuff again.    [name], [place] 04/19/2024 09:13

## 2024-04-19 NOTE — Nursing Note (Signed)
 Pain Same  Last seen on 4/82025 for procedure rfa     % of relief for    Lowest level of pain 5 /10  Highest level of pain 9 /10  Prudence Brown, MA      Pain and Function:     [place] Pain Rating Scale     On a scale of 0-10, during the past 24 hours, pain has interfered with you usual activity:     0  On a scale of 0-10, during the past 24 hours, pain has interfered with your sleep:  5    On a scale of 0-10, during the past 24 hours, pain has affected your mood:   4    On a scale of 0-10, during the past 24 hours, pain has contributed to your stress:   4    On a scale of 0-10, what is your overall pain Rating:  9

## 2024-04-19 NOTE — Telephone Encounter (Signed)
 Please make patient aware order placed for [employer]pack. Thanks.

## 2024-04-20 LAB — MONOCLONAL GAMMOPATHY PROFILE WITH IMMUNOTYPING REFLEX
ALBUMIN: 3.4 g/dL (ref 3.4–4.8)
KAPPA FREE LIGHT CHAINS: 7.97 mg/dL — ABNORMAL HIGH (ref 1.25–3.25)
KAPPA/LAMBDA FLC RATIO: 3.72 — ABNORMAL HIGH (ref 0.80–2.10)
LAMBDA FREE LIGHT CHAINS: 2.14 mg/dL (ref 0.60–2.70)
TOTAL PROTEIN: 6.6 g/dL

## 2024-04-20 LAB — KAPPA AND LAMBDA FREE LIGHT CHAINS, SERUM
KAPPA FREE LIGHT CHAINS: 7.97 mg/dL — ABNORMAL HIGH (ref 1.25–3.25)
KAPPA/LAMBDA FLC RATIO: 3.72 — ABNORMAL HIGH (ref 0.80–2.10)
LAMBDA FREE LIGHT CHAINS: 2.14 mg/dL (ref 0.60–2.70)

## 2024-04-21 ENCOUNTER — Ambulatory Visit: Payer: Self-pay | Attending: Internal Medicine | Admitting: Internal Medicine

## 2024-04-21 ENCOUNTER — Telehealth (HOSPITAL_BASED_OUTPATIENT_CLINIC_OR_DEPARTMENT_OTHER): Payer: Self-pay | Admitting: Emergency Medicine

## 2024-04-21 ENCOUNTER — Other Ambulatory Visit: Payer: Self-pay

## 2024-04-21 DIAGNOSIS — D472 Monoclonal gammopathy: Secondary | ICD-10-CM

## 2024-04-21 DIAGNOSIS — E8809 Other disorders of plasma-protein metabolism, not elsewhere classified: Secondary | ICD-10-CM

## 2024-04-21 NOTE — Telephone Encounter (Signed)
 Called patient's son regarding order for L4/5 LESI order dropped by [facility] 04/19/24. She has an appointment with PCP 05/24/24 need a updated A1C. I told him I will follow up with him after that appointment to get her scheduled. Deferred to 05/25/24    Ena Harries

## 2024-04-21 NOTE — Cancer Center Note (Signed)
 TELEMEDICINE DOCUMENTATION:    Patient Location:  MyChart telephone visit from home address: [address] [facility] [address]    Patient/family aware of provider location:  yes  Patient/family consent for telemedicine:  yes  Examination observed and performed by:   Patient was not examined  Encounter Date: 04/21/2024   4:00 PM EDT  Name:  Joanna Black  Age: 78 y.o.  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 female accompanied by her husband who provided some of the history.  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  History of anemia and chronic kidney disease.  She denies any bone pains.  No weight loss.  No headache, dizziness or falls.  No other complaints    REVIEW OF SYSTEMS:  General: (-) pain. (-) fevers (-) chills. (-) weight loss. (-) fatigue.  Lymphatic: (-) palpable masses. (-) night sweats.  Heme: (-) easy bruising (-) bleeding.  (-) recurrent infections.   HEENT. (-) vision changes (-) hearing changes. (-) dysphagia. (-) sore throat.   Heart: (-) chest pain. (-) palpitation. (-) orthopnea. (-) LE edema.   Lungs: (-) dyspnea (on exertion) (-) hemoptysis. (-) cough.   Abdomen: (-) poor appetite. (-) abdominal pain. (-) nausea (-) vomiting. (-) diarrhea. (-) constipation.   GU: (-) dysuria (-) Urgency. (-) Hematuria.   MS. (-) joint pain (-) ext swelling. (-) Back pain.    Dermatologic: (-) rashes. (-) pruritus.   Psychiatric: (-) Depression. (-) anxiety. (-) insomnia.   Neurologic: (-) headaches. (-) neuropathy. (-) weakness. (-) memory problems.  Other review of systems negative.     PAST MEDICAL HISTORY:  Past Medical History:   Diagnosis Date    Arthritis     Asthma     Back problem     BMI 35.0-35.9,adult 02/16/2020    CAD (coronary artery disease)     mild    Cataract     Cataracts, bilateral     CHF (congestive heart failure)     Chronic  bronchitis with emphysema (CMS HCC)     Chronic low back pain     Claustrophobia     Depression     Diabetes     Diabetes mellitus, type 2     Encounter for support and coordination of transition of care 11/16/2022    Esophageal reflux     Essential hypertension 01/28/2019    H/O urinary tract infection     HH (hiatus hernia)     History of kidney disease     states, "low kidney functions."    Hypercholesterolemia     Hypertension     Hypertriglyceridemia     Type 2 diabetes mellitus          MEDICATIONS:  Current Outpatient Medications   Medication Sig    acetaminophen  (TYLENOL ) 500 mg Oral Tablet Take 1 Tablet (500 mg total) by mouth Every night as needed Takes 2 at night    albuterol  sulfate (PROVENTIL  OR VENTOLIN  OR PROAIR ) 90 mcg/actuation Inhalation oral inhaler Take 1-2 Puffs by inhalation Every 6 hours as needed    albuterol  sulfate (PROVENTIL ) 2.5 mg /3 mL (0.083 %) Inhalation nebulizer solution Take 3 mL (2.5 mg total) by nebulization Three times a day as needed for Wheezing    azithromycin  (ZITHROMAX ) 250 mg Oral Tablet Take 500 mg (  2 tab) on day 1; take 250 mg (1 tab) on days 2-5.    calcium carbonate/vitamin D3 (VITAMIN D -3 ORAL) Take 2,000 Int'l Units/day by mouth Daily    ezetimibe  (ZETIA ) 10 mg Oral Tablet TAKE 1 TABLET BY MOUTH EVERY DAY IN THE EVENING    FLUoxetine  (PROZAC ) 20 mg Oral Capsule Take 1 Capsule (20 mg total) by mouth Once a day    furosemide  (LASIX ) 20 mg Oral Tablet TAKE 1 TABLET (20 MG TOTAL) BY MOUTH TWICE PER DAY AS NEEDED    magnesium  oxide (MAG-OX) 400 mg Oral Tablet Take 1 Tablet (400 mg total) by mouth Three times a day    metoprolol  succinate (TOPROL -XL) 25 mg Oral Tablet Sustained Release 24 hr Take 1 Tablet (25 mg total) by mouth Every night    omeprazole  (PRILOSEC) 40 mg Oral Capsule, Delayed Release(E.C.) Take 1 Capsule (40 mg total) by mouth Once a day Indications: gastroesophageal reflux disease    predniSONE  (DELTASONE ) 1 mg Oral Tablet Take 10 mg daily for 1 week, then  reduce dose to 8 mg daily for 1 week, then reduce dose to 6 mg daily for 4 weeks, then 5 mg daily for 4 weeks (use 1 mg tablets in combination with 5 mg tablets)    predniSONE  (DELTASONE ) 5 mg Oral Tablet Take 10 mg daily for 1 week, then reduce dose to 8 mg daily for 1 week, then reduce dose to 6 mg daily for 4 weeks, then 5 mg daily for 4 weeks (use 5 mg tablets in combination with 1 mg tablets)    tiZANidine  (ZANAFLEX ) 4 mg Oral Tablet TAKE 1 TABLET BY MOUTH THREE TIMES A DAY AS NEEDED FOR MUSCLE CRAMPS    TRULICITY  0.75 mg/0.5 mL Subcutaneous Pen Injector INJECT THE CONTENTS OF ONE PEN  SUBCUTANEOUSLY WEEKLY AS  DIRECTED       ALLERGIES:  Allergies   Allergen Reactions    Amoxicillin  Other Adverse Reaction (Add comment)     Unknown reaction    Augmentin [Amoxicillin-Pot Clavulanate] Nausea/ Vomiting    Statins-Hmg-Coa Reductase Inhibitors Myalgia     PAST SURGICAL HISTORY:  Past Surgical History:   Procedure Laterality Date    CATARACT EXTRACTION, BILATERAL Bilateral     bilateral lens implant    COLONOSCOPY      ENDOSCOPIC ULTRASOUND ESOPHGEAL  04/15/2023    ESOPHAGOGASTRODUODENOSCOPY      HX CARPAL TUNNEL RELEASE      HX CATARACT REMOVAL Right 12/30/2019    HX CATARACT REMOVAL Left 01/06/2020    HX CHOLECYSTECTOMY      HX EXPOSURE TO METAL SHAVINGS      HX HEART CATHETERIZATION      HX HYSTERECTOMY      HX OOPHORECTOMY      HX TAH AND BSO      HX TUBAL LIGATION      HX VEIN STRIPPING      Left leg         SOCIAL HISTORY:  Social History     Socioeconomic History    Marital status: Married     Spouse name: Theodis Fiscal    Number of children: 3    Years of education: 13    Highest education level: High school graduate   Occupational History    Occupation: Retired   Tobacco Use    Smoking status: Never     Passive exposure: Never    Smokeless tobacco: Never   Vaping Use    Vaping status: Never Used  Substance and Sexual Activity    Alcohol use: No     Alcohol/week: 0.0 standard drinks of alcohol    Drug use: No     Sexual activity: Not Currently     Partners: Male   Other Topics Concern    Uses Cane Not Asked    Uses walker Not Asked    Uses wheelchair Not Asked    Right hand dominant Yes    Left hand dominant Not Asked    Ambidextrous Not Asked    Shift Work Not Asked    Unusual Sleep-Wake Schedule Not Asked    Ability to Walk 1 Flight of Steps without SOB/CP No    Routine Exercise Not Asked    Ability to Walk 2 Flight of Steps without SOB/CP Not Asked    Unable to Ambulate Not Asked    Total Care Not Asked    Ability To Do Own ADL's Yes    Uses Walker Not Asked    Other Activity Level Not Asked    Uses Cane Not Asked   Social History Narrative    MARRIED.  Lives in single story home.  Heats home with electric and has well water .        April Haney, LPN  1/61/0960, 14:01     Social Determinants of Health     Financial Resource Strain: Low Risk  (04/13/2024)    Financial Resource Strain     SDOH Financial: No   Transportation Needs: Low Risk  (04/13/2024)    Transportation Needs     SDOH Transportation: No   Social Connections: Low Risk  (04/13/2024)    Social Connections     SDOH Social Isolation: 5 or more times a week   Intimate Partner Violence: Low Risk  (04/13/2024)    Intimate Partner Violence     SDOH Domestic Violence: No   Housing Stability: Low Risk  (04/13/2024)    Housing Stability     SDOH Housing Situation: I have housing.     SDOH Housing Worry: No     FAMILY HISTORY:  Family Medical History:       Problem Relation (Age of Onset)    Bone cancer Father    Diabetes Multiple family members    Hypertension (High Blood Pressure) Multiple family members    No Known Problems Mother    Stomach Cancer Paternal Grandmother              PHYSICAL EXAMINATION:  There were no vitals filed for this visit.        LABORATORY:  Results for orders placed or performed in visit on 04/19/24 (from the past 72 hours)   MONOCLONAL GAMMOPATHY PROFILE WITH SPEP, FLC, AND IMMUNOTYPING REFLEX    Collection Time: 04/19/24 11:31 AM    Narrative     The following orders were created for panel order MONOCLONAL GAMMOPATHY PROFILE WITH SPEP, FLC, AND IMMUNOTYPING REFLEX.  Procedure                               Abnormality         Status                     ---------                               -----------         ------  KAPPA AND LAMBDA FREE LI.Aaron AasAaron Aas[[id number]]  Abnormal            Final result               MONOCLONAL GAMMOPATHY PR.Aaron AasAaron Aas[[id number]]  Abnormal            Final result               ALBUMIN FOR ELECTROPHORESIS[[id number]]  Normal              Final result               PROTEIN FOR ELECTROPHORESIS[[id number]]  Normal              Final result                 Please view results for these tests on the individual orders.   CBC/DIFF    Collection Time: 04/19/24 11:31 AM    Narrative    The following orders were created for panel order CBC/DIFF.  Procedure                               Abnormality         Status                     ---------                               -----------         ------                     CBC WITH DIFF[[id number]]                Abnormal            Final result                 Please view results for these tests on the individual orders.   COMPREHENSIVE METABOLIC PANEL, NON-FASTING    Collection Time: 04/19/24 11:31 AM   Result Value Ref Range    SODIUM 140 136 - 145 mmol/L    POTASSIUM 3.9 3.5 - 5.1 mmol/L    CHLORIDE 100 96 - 111 mmol/L    CO2 TOTAL 30 23 - 31 mmol/L    ANION GAP 10 4 - 13 mmol/L    BUN 27 (H) 8 - 25 mg/dL    CREATININE 6.29 (H) 0.60 - 1.05 mg/dL    BUN/CREA RATIO 19 6 - 22    ALBUMIN 3.5 3.4 - 4.8 g/dL     CALCIUM 9.2 8.6 - 52.8 mg/dL    GLUCOSE 413 (H) 65 - 125 mg/dL    ALKALINE PHOSPHATASE 93 55 - 145 U/L    ALT (SGPT) 11 <31 U/L    AST (SGOT)  16 11 - 34 U/L    BILIRUBIN TOTAL 0.5 0.3 - 1.3 mg/dL    PROTEIN TOTAL 7.2 6.0 - 8.0 g/dL    ESTIMATED GFR - FEMALE 39 (L) >=60 mL/min/BSA   KAPPA AND LAMBDA FREE LIGHT CHAINS, SERUM    Collection Time: 04/19/24 11:31 AM   Result Value Ref Range     KAPPA FREE LIGHT CHAINS 7.97 (H) 1.25 - 3.25 mg/dL    LAMBDA FREE LIGHT CHAINS 2.14 0.60 - 2.70 mg/dL    KAPPA/LAMBDA FLC RATIO 3.72 (H) 0.80 - 2.10  Narrative     Serum free light chain (FLC) measurement is recommended by the International Myeloma Working Group (IMWG) as a screening/diagnostic, monitoring, and prognostic tool in the setting of plasma cell dyscrasias such as myeloma and plasmacytoma.  This result was generated with the FDA-approved Binding Site Optilite FLC assay; flagging is set according to updated population-based reference intervals after internal study of sequential FLC findings over many years, and is consistent with several published updates relevant to Optilite.  Interpretive notes:  -FLC production is generally increased in inflammatory response as part of polyclonal expansion and immunoglobulin production; this is not limited to pathological responses, so interpretation within the context of electrophoresis findings should be helpful.  -Delayed renal clearance associated with kidney disease increases circulating serum FLC results.  The degree of increase is notably worsened as CKD progresses.  -In both situations, kappa and lambda FLC results will show increases, but the ratio stays relatively normal, whereas a combination of inflammatory illness and kidney disease can also mildly impact the FLC ratio.  -Gross increase in one FLC and not the other, with an associated significant shift in the FLC ratio, can indicate a pathological process.  Pair with serum electrophoresis findings, and consider urine electrophoresis study if this finding is paired with a negative serum electrophoresis result. [Confirmatory immunotyping is reflexively added as needed by interpreting clinical pathologists to prevent unnecessary high-cost confirmatory testing.]       MONOCLONAL GAMMOPATHY PROFILE WITH IMMUNOTYPING REFLEX    Collection Time: 04/19/24 11:31 AM   Result Value Ref Range    KAPPA FREE LIGHT  CHAINS 7.97 (H) 1.25 - 3.25 mg/dL    LAMBDA FREE LIGHT CHAINS 2.14 0.60 - 2.70 mg/dL    KAPPA/LAMBDA FLC RATIO 3.72 (H) 0.80 - 2.10    PATHOLOGIST INTERPRETATION SPEP Pathological Result (A) Non-Pathological Result    TOTAL PROTEIN 6.6 g/dL    ALBUMIN 3.4 3.4 - 4.8 g/dL    Narrative     Serum free light chain (FLC) measurement is recommended by the International Myeloma Working Group (IMWG) as a screening/diagnostic, monitoring, and prognostic tool in the setting of plasma cell dyscrasias such as myeloma and plasmacytoma.  This result was generated with the FDA-approved Binding Site Optilite FLC assay; flagging is set according to updated population-based reference intervals after internal study of sequential FLC findings over many years, and is consistent with several published updates relevant to Optilite.  Interpretive notes:  -FLC production is generally increased in inflammatory response as part of polyclonal expansion and immunoglobulin production; this is not limited to pathological responses, so interpretation within the context of electrophoresis findings should be helpful.  -Delayed renal clearance associated with kidney disease increases circulating serum FLC results.  The degree of increase is notably worsened as CKD progresses.  -In both situations, kappa and lambda FLC results will show increases, but the ratio stays relatively normal, whereas a combination of inflammatory illness and kidney disease can also mildly impact the FLC ratio.  -Gross increase in one FLC and not the other, with an associated significant shift in the FLC ratio, can indicate a pathological process.  Pair with serum electrophoresis findings, and consider urine electrophoresis study if this finding is paired with a negative serum electrophoresis result. [Confirmatory immunotyping is reflexively added as needed by interpreting clinical pathologists to prevent unnecessary high-cost confirmatory testing.]       ALBUMIN FOR  ELECTROPHORESIS    Collection Time: 04/19/24 11:31 AM   Result Value Ref Range  ALBUMIN 3.4 3.4 - 4.8 g/dL    PROTEIN FOR ELECTROPHORESIS    Collection Time: 04/19/24 11:31 AM   Result Value Ref Range    PROTEIN TOTAL 6.6 5.6 - 7.6 g/dL   CBC WITH DIFF    Collection Time: 04/19/24 11:31 AM   Result Value Ref Range    WBC 12.8 (H) 3.7 - 11.0 x10^3/uL    RBC 4.70 3.85 - 5.22 x10^6/uL    HGB 14.0 11.5 - 16.0 g/dL    HCT 29.5 62.1 - 30.8 %    MCV 96.0 78.0 - 100.0 fL    MCH 29.8 26.0 - 32.0 pg    MCHC 31.0 31.0 - 35.5 g/dL    RDW-CV 65.7 84.6 - 96.2 %    PLATELETS 301 150 - 400 x10^3/uL    MPV 11.2 8.7 - 12.5 fL    NEUTROPHIL % 66.9 %    LYMPHOCYTE % 21.7 %    MONOCYTE % 7.6 %    EOSINOPHIL % 2.7 %    BASOPHIL % 0.6 %    NEUTROPHIL # 8.58 (H) 1.50 - 7.70 x10^3/uL    LYMPHOCYTE # 2.79 1.00 - 4.80 x10^3/uL    MONOCYTE # 0.98 0.20 - 1.10 x10^3/uL    EOSINOPHIL # 0.34 <=0.50 x10^3/uL    BASOPHIL # <0.10 <=0.20 x10^3/uL    IMMATURE GRANULOCYTE % 0.5 0.0 - 1.0 %    IMMATURE GRANULOCYTE # <0.10 <0.10 x10^3/uL       IMAGING:  I have reviewed the imaging studies and discussed them with the patient.    ASSESSMENT/PLAN: 78 y.o. 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    Patient Active Problem List   Diagnosis    Type 2 diabetes mellitus with stage 3a chronic kidney disease, without long-term current use of insulin  (CMS HCC)    Cervical pain (neck)    Thoracic back pain    Chronic bilateral low back pain    Essential hypertension    Cardiomyopathy, dilated, nonischemic (CMS HCC)    Chronic GERD    Chronic systolic congestive heart failure (CMS HCC)    BMI 35.0-35.9,adult    Mixed hyperlipidemia    CKD (chronic kidney disease) stage 3, 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/min    History of colon polyps    Colon cancer screening    Gastroesophageal reflux disease    Dysphagia    Schatzki's ring    Chronic diarrhea    Diverticulosis of colon    Hypoxia    Enteropathogenic Escherichia coli infection    Encounter for support and  coordination of transition of care    Chronic anemia    Osteoporosis    IDA (iron  deficiency anemia)    Bloating    Belching       Plasma cell dyscrasia  - Serum protein electrophoresis demonstrates an M protein in the mid-gamma region. 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 Density of the M protein is 0.5 g/dL, estimated from the tracing.   - immuno typing showed IgG kappa monoclonal immunoglobulin identified with immunosubtraction in this serum specimen. The clone migrates in the mid-gamma region.   - kappa/lambda free light chain ratio 4.68  - noticed history of chronic kidney disease and normocytic anemia  - rule out multiple myeloma  - patient does not want to have bone marrow biopsy done  - No evidence of lytic bone lesion on PET-CT.  - most recent kappa/lambda free light chain ratio 3.72, SPEP showed Density of  the M protein is 0.4 g/dL.  Overall stable SPEP.  Continue to watch  - return to clinic in 3 months for MD/labs    Normocytic anemia  - hemoglobin stable 14.0    Telemedicine visit encounter time: 15 min    Kincade Granberg, MD

## 2024-04-25 ENCOUNTER — Other Ambulatory Visit (INDEPENDENT_AMBULATORY_CARE_PROVIDER_SITE_OTHER): Payer: Self-pay | Admitting: Family Medicine

## 2024-04-25 DIAGNOSIS — I152 Hypertension secondary to endocrine disorders: Secondary | ICD-10-CM

## 2024-04-27 ENCOUNTER — Ambulatory Visit (INDEPENDENT_AMBULATORY_CARE_PROVIDER_SITE_OTHER): Payer: Self-pay | Admitting: Family Medicine

## 2024-04-27 NOTE — Telephone Encounter (Signed)
 Called and informed patient of doctors notes, patient voiced understanding.    [name], [place] 04/27/2024 11:21

## 2024-04-27 NOTE — Telephone Encounter (Signed)
 Patient called and asked if PCP could order an A1C for her. It is for pain management. Patient stated they will not give her a shot until she gets A1C done.    [name], [place] 04/27/2024 10:18

## 2024-04-27 NOTE — Telephone Encounter (Signed)
 Please contact patient make her aware she has pending lab work already ordered.  Thank you.

## 2024-05-05 ENCOUNTER — Ambulatory Visit (HOSPITAL_COMMUNITY)

## 2024-05-05 ENCOUNTER — Ambulatory Visit
Admission: RE | Admit: 2024-05-05 | Discharge: 2024-05-05 | Disposition: A | Discharge status: Home / Self Care | Source: Ambulatory Visit | Attending: Family Medicine | Admitting: Family Medicine

## 2024-05-05 ENCOUNTER — Encounter (HOSPITAL_COMMUNITY): Payer: Self-pay

## 2024-05-05 ENCOUNTER — Other Ambulatory Visit: Payer: Self-pay

## 2024-05-05 DIAGNOSIS — E119 Type 2 diabetes mellitus without complications: Secondary | ICD-10-CM

## 2024-05-05 DIAGNOSIS — E785 Hyperlipidemia, unspecified: Secondary | ICD-10-CM | POA: Insufficient documentation

## 2024-05-05 DIAGNOSIS — E1169 Type 2 diabetes mellitus with other specified complication: Secondary | ICD-10-CM | POA: Insufficient documentation

## 2024-05-05 DIAGNOSIS — Z1231 Encounter for screening mammogram for malignant neoplasm of breast: Secondary | ICD-10-CM | POA: Insufficient documentation

## 2024-05-05 LAB — COMPREHENSIVE METABOLIC PANEL, NON-FASTING
ALBUMIN: 3.3 g/dL — ABNORMAL LOW (ref 3.4–4.8)
ALKALINE PHOSPHATASE: 72 U/L (ref 55–145)
ALT (SGPT): 9 U/L (ref <31)
ANION GAP: 9 mmol/L (ref 4–13)
AST (SGOT): 14 U/L (ref 11–34)
BILIRUBIN TOTAL: 0.3 mg/dL (ref 0.3–1.3)
BUN/CREA RATIO: 12 (ref 6–22)
BUN: 15 mg/dL (ref 8–25)
CALCIUM: 8.7 mg/dL (ref 8.6–10.3)
CHLORIDE: 104 mmol/L (ref 96–111)
CO2 TOTAL: 27 mmol/L (ref 23–31)
CREATININE: 1.25 mg/dL — ABNORMAL HIGH (ref 0.60–1.05)
ESTIMATED GFR - FEMALE: 44 mL/min/BSA — ABNORMAL LOW (ref >=60)
GLUCOSE: 124 mg/dL (ref 65–125)
POTASSIUM: 4.5 mmol/L (ref 3.5–5.1)
PROTEIN TOTAL: 6.5 g/dL (ref 6.0–8.0)
SODIUM: 140 mmol/L (ref 136–145)

## 2024-05-05 LAB — LIPID PANEL
CHOL/HDL RATIO: 4.6
CHOLESTEROL: 211 mg/dL — ABNORMAL HIGH (ref 100–200)
HDL CHOL: 46 mg/dL — ABNORMAL LOW (ref >=50)
LDL CALC: 124 mg/dL — ABNORMAL HIGH (ref <100)
NON-HDL: 165 mg/dL (ref <=190)
TRIGLYCERIDES: 231 mg/dL — ABNORMAL HIGH (ref <150)
VLDL CALC: 41 mg/dL — ABNORMAL HIGH (ref <30)

## 2024-05-05 LAB — MAGNESIUM: MAGNESIUM: 2.1 mg/dL (ref 1.8–2.6)

## 2024-05-05 LAB — CBC WITH DIFF
BASOPHIL #: <0.1 10*3/uL (ref <=0.20)
BASOPHIL %: 0.6 %
EOSINOPHIL #: 0.21 10*3/uL (ref <=0.50)
EOSINOPHIL %: 2.1 %
HCT: 39.4 % (ref 34.8–46.0)
HGB: 12.3 g/dL (ref 11.5–16.0)
IMMATURE GRANULOCYTE #: <0.1 10*3/uL (ref <0.10)
IMMATURE GRANULOCYTE %: 0.4 % (ref 0.0–1.0)
LYMPHOCYTE #: 2.72 10*3/uL (ref 1.00–4.80)
LYMPHOCYTE %: 27.3 %
MCH: 30.2 pg (ref 26.0–32.0)
MCHC: 31.2 g/dL (ref 31.0–35.5)
MCV: 96.8 fL (ref 78.0–100.0)
MONOCYTE #: 0.78 10*3/uL (ref 0.20–1.10)
MONOCYTE %: 7.8 %
MPV: 11.8 fL (ref 8.7–12.5)
NEUTROPHIL #: 6.15 10*3/uL (ref 1.50–7.70)
NEUTROPHIL %: 61.8 %
PLATELETS: 219 10*3/uL (ref 150–400)
RBC: 4.07 10*6/uL (ref 3.85–5.22)
RDW-CV: 15.2 % (ref 11.5–15.5)
WBC: 10 10*3/uL (ref 3.7–11.0)

## 2024-05-05 LAB — THYROID STIMULATING HORMONE (SENSITIVE TSH): TSH: 1.505 u[IU]/mL (ref 0.350–4.940)

## 2024-05-05 LAB — MICROALBUMIN/CREATININE RATIO, URINE, RANDOM
CREATININE RANDOM URINE: 109.95 mg/dL (ref >=0.00)
MICROALBUMIN RANDOM URINE: 0.6 mg/dL
MICROALBUMIN/CREATININE RATIO RANDOM URINE: 5.5 mg/g (ref <30.0)

## 2024-05-06 ENCOUNTER — Ambulatory Visit (INDEPENDENT_AMBULATORY_CARE_PROVIDER_SITE_OTHER): Payer: Self-pay | Admitting: Family Medicine

## 2024-05-18 ENCOUNTER — Ambulatory Visit (HOSPITAL_BASED_OUTPATIENT_CLINIC_OR_DEPARTMENT_OTHER): Payer: Self-pay

## 2024-05-21 NOTE — Progress Notes (Unsigned)
 [facility]  [address]  [place], [place] [address]  P: [phone]  F: [phone]       NAME:  Joanna Black  DOB:  1946/03/08  AGE:  78 y.o.  MRN:  [id number]  APPT:  05/24/2024     No chief complaint on file.       Subjective:     This is a case of a 78 y.o. year old female who comes in today for ***    Past Medical History:   Diagnosis Date    Arthritis     Asthma     Back problem     BMI 35.0-35.9,adult 02/16/2020    CAD (coronary artery disease)     mild    Cataract     Cataracts, bilateral     CHF (congestive heart failure)     Chronic bronchitis with emphysema (CMS HCC)     Chronic low back pain     Claustrophobia     Depression     Diabetes     Diabetes mellitus, type 2     Encounter for support and coordination of transition of care 11/16/2022    Esophageal reflux     Essential hypertension 01/28/2019    H/O urinary tract infection     HH (hiatus hernia)     History of kidney disease     states, "low kidney functions."    Hypercholesterolemia     Hypertension     Hypertriglyceridemia     Type 2 diabetes mellitus          Past Surgical History:   Procedure Laterality Date    (10AM) CAUDAL EPIDURAL INJECTION  **DIABETIC** N/A 09/23/2023    Performed by Serge Dancer, DO at [facility] OR MAIN    (1145AM) #1 BILATERAL L4/5, L5/S1 LUMBAR MEDIAL BRANCH BLOCK **DIABETIC** **GIVE 1MG  ATIVAN ON ARRIVAL** Bilateral 01/27/2024    Performed by Serge Dancer, DO at [facility] OR MAIN    (1145AM) #1 BILATERAL L4/5, L5/S1 LUMBAR MEDIAL BRANCH BLOCK **DIABETIC** **GIVE 1MG  ATIVAN ON ARRIVAL** Bilateral 01/27/2024    Performed by Serge Dancer, DO at [facility] OR MAIN    (1145AM) #2 BILATERAL L4/5, L5/S1 LUMBAR MEDIAL BRANCH BLOCK **DIABETIC** **GIVE 1MG  ATIVAN ON ARRIVAL** Bilateral 02/10/2024    Performed by Serge Dancer, DO at [facility] OR MAIN    (1145AM) LEFT L4/5, L5/S1 LUMBAR MEDIAL BRANCH NERVE RADILFREQUENCY ABLATION **DIABETIC** **GIVE 1MG  ATIVAN ON ARRIVAL** Left 03/23/2024    Performed by Serge Dancer, DO at [facility] OR MAIN     (1145AM) LEFT L4/5, L5/S1 LUMBAR MEDIAL BRANCH NERVE RADILFREQUENCY ABLATION **DIABETIC** **GIVE 1MG  ATIVAN ON ARRIVAL** Left 03/23/2024    Performed by Serge Dancer, DO at [facility] OR MAIN    (1145AM) RIGHT L4/5, L5/S1 LUMBAR MEDIAL BRANCH NERVE RADILFREQUENCY ABLATION **DIABETIC** **GIVE 1MG  ATIVAN ON ARRIVAL** Right 02/24/2024    Performed by Serge Dancer, DO at [facility] OR MAIN    (1145AM) RIGHT L4/5, L5/S1 LUMBAR MEDIAL BRANCH NERVE RADILFREQUENCY ABLATION **DIABETIC** **GIVE 1MG  ATIVAN ON ARRIVAL** Right 02/24/2024    Performed by Serge Dancer, DO at [facility] OR MAIN    CATARACT EXTRACTION, BILATERAL Bilateral     bilateral lens implant    COLONOSCOPY      CYSTOSCOPY N/A 05/26/2023    Performed by Hermine Loots, MD at [facility] OR MAIN    ENDOSCOPIC U/S UPPER with FNA N/A 04/15/2023    Performed by Rico Charters, DO at [facility] OR ENDO    ENDOSCOPIC  U/S UPPER with FNA N/A 05/10/2022    Performed by Rico Charters, DO at [facility] OR ENDO    ENDOSCOPIC ULTRASOUND ESOPHGEAL  04/15/2023    ESOPHAGOGASTRODUODENOSCOPY      GASTROSCOPY  05/10/2022    Performed by Rico Charters, DO at [facility] OR ENDO    GASTROSCOPY WITH DILATION Left 09/24/2022    Performed by Charmaine Coop, MD at [facility] OR ENDO    GASTROSCOPY WITH DILATION Left 06/05/2022    Performed by Charmaine Coop, MD at [facility] OR ENDO    GASTROSCOPY WITH DILATION and bxs Left 05/07/2022    Performed by Charmaine Coop, MD at [facility] OR ENDO    HX CARPAL TUNNEL RELEASE      HX CATARACT REMOVAL Right 12/30/2019    HX CATARACT REMOVAL Left 01/06/2020    HX CHOLECYSTECTOMY      HX EXPOSURE TO METAL SHAVINGS      HX HEART CATHETERIZATION      HX HYSTERECTOMY      HX OOPHORECTOMY      HX TAH AND BSO      HX TUBAL LIGATION      HX VEIN STRIPPING      Left leg    PHACO WITH INTRAOCULAR LENS Left 01/06/2020    Performed by Lin Rend, MD at [facility] OR MAIN    PHACO WITH INTRAOCULAR LENS Right 12/30/2019    Performed by Lin Rend, MD at [facility] OR MAIN     Family Medical History:       Problem  Relation (Age of Onset)    Bone cancer Father    Diabetes Multiple family members    Hypertension (High Blood Pressure) Multiple family members    No Known Problems Mother    Stomach Cancer Paternal Grandmother            Social History     Socioeconomic History    Marital status: Married     Spouse name: Theodis Fiscal    Number of children: 3    Years of education: 13    Highest education level: High school graduate   Occupational History    Occupation: Retired   Tobacco Use    Smoking status: Never     Passive exposure: Never    Smokeless tobacco: Never   Vaping Use    Vaping status: Never Used   Substance and Sexual Activity    Alcohol use: No     Alcohol/week: 0.0 standard drinks of alcohol    Drug use: No    Sexual activity: Not Currently     Partners: Male   Other Topics Concern    Right hand dominant Yes    Ability to Walk 1 Flight of Steps without SOB/CP No    Ability To Do Own ADL's Yes   Social History Narrative    MARRIED.  Lives in single story home.  Heats home with electric and has well water .        April Haney, LPN  1/61/0960, 14:01     Social Determinants of Health     Financial Resource Strain: Low Risk  (04/13/2024)    Financial Resource Strain     SDOH Financial: No   Transportation Needs: Low Risk  (04/13/2024)    Transportation Needs     SDOH Transportation: No   Social Connections: Low Risk  (04/13/2024)    Social Connections     SDOH Social Isolation: 5 or more times a week   Intimate Partner Violence: Low Risk  (  04/13/2024)    Intimate Partner Violence     SDOH Domestic Violence: No   Housing Stability: Low Risk  (04/13/2024)    Housing Stability     SDOH Housing Situation: I have housing.     SDOH Housing Worry: No     Current Outpatient Medications   Medication Sig    acetaminophen  (TYLENOL ) 500 mg Oral Tablet Take 1 Tablet (500 mg total) by mouth Every night as needed Takes 2 at night    albuterol  sulfate (PROVENTIL  OR VENTOLIN  OR PROAIR ) 90 mcg/actuation Inhalation oral inhaler Take 1-2 Puffs by  inhalation Every 6 hours as needed    albuterol  sulfate (PROVENTIL ) 2.5 mg /3 mL (0.083 %) Inhalation nebulizer solution Take 3 mL (2.5 mg total) by nebulization Three times a day as needed for Wheezing    azithromycin  (ZITHROMAX ) 250 mg Oral Tablet Take 500 mg (2 tab) on day 1; take 250 mg (1 tab) on days 2-5.    calcium carbonate/vitamin D3 (VITAMIN D -3 ORAL) Take 2,000 Int'l Units/day by mouth Daily    ezetimibe  (ZETIA ) 10 mg Oral Tablet TAKE 1 TABLET BY MOUTH EVERY DAY IN THE EVENING    FLUoxetine  (PROZAC ) 20 mg Oral Capsule Take 1 Capsule (20 mg total) by mouth Once a day    furosemide  (LASIX ) 20 mg Oral Tablet TAKE 1 TABLET (20 MG TOTAL) BY MOUTH TWICE PER DAY AS NEEDED    magnesium  oxide (MAG-OX) 400 mg Oral Tablet Take 1 Tablet (400 mg total) by mouth Three times a day    metoprolol  succinate (TOPROL -XL) 25 mg Oral Tablet Sustained Release 24 hr TAKE 1 TABLET BY MOUTH EVERY NIGHT.    omeprazole  (PRILOSEC) 40 mg Oral Capsule, Delayed Release(E.C.) Take 1 Capsule (40 mg total) by mouth Once a day Indications: gastroesophageal reflux disease    predniSONE  (DELTASONE ) 1 mg Oral Tablet Take 10 mg daily for 1 week, then reduce dose to 8 mg daily for 1 week, then reduce dose to 6 mg daily for 4 weeks, then 5 mg daily for 4 weeks (use 1 mg tablets in combination with 5 mg tablets)    predniSONE  (DELTASONE ) 5 mg Oral Tablet Take 10 mg daily for 1 week, then reduce dose to 8 mg daily for 1 week, then reduce dose to 6 mg daily for 4 weeks, then 5 mg daily for 4 weeks (use 5 mg tablets in combination with 1 mg tablets)    tiZANidine  (ZANAFLEX ) 4 mg Oral Tablet TAKE 1 TABLET BY MOUTH THREE TIMES A DAY AS NEEDED FOR MUSCLE CRAMPS    TRULICITY  0.75 mg/0.5 mL Subcutaneous Pen Injector INJECT THE CONTENTS OF ONE PEN  SUBCUTANEOUSLY WEEKLY AS  DIRECTED     Review of Systems  Objective:   There were no vitals taken for this visit.      Physical Exam    PHQ Questionnaire       Assessment/Plan   There are no diagnoses linked to  this encounter.  {bmigant:32679}  No orders of the defined types were placed in this encounter.                Health Maintenance Due   Topic Date Due    Adult Tdap-Td (1 - Tdap) Never done    RSV Adult 60+ or Pregnancy (1 - 1-dose 75+ series) Never done    Diabetic Retinal Exam  10/25/2020    Osteoporosis screening  12/05/2022    Covid-19 Vaccine (5 - 2024-25 season) 08/24/2023  Diabetic A1C  02/18/2024       Controlled Substances Tracking      Follow up: No follow-ups on file.    The patient was given ample opportunity to ask questions and those questions were answered to the patient's satisfaction. The patient was encouraged to be involved in their own care, and all diagnoses, medications, and medication side-effects were discussed. The patient was told to contact me with any additional questions or concerns, or go to the ED in the case of an emergency.       Theo Reither, DO

## 2024-05-24 ENCOUNTER — Encounter (INDEPENDENT_AMBULATORY_CARE_PROVIDER_SITE_OTHER): Payer: Self-pay | Admitting: Family Medicine

## 2024-05-24 ENCOUNTER — Other Ambulatory Visit: Payer: Self-pay

## 2024-05-24 ENCOUNTER — Ambulatory Visit: Payer: Self-pay | Attending: Family Medicine | Admitting: Family Medicine

## 2024-05-24 VITALS — BP 126/84 | HR 75 | Temp 96.6°F | Resp 18 | Ht 64.02 in | Wt 213.6 lb

## 2024-05-24 DIAGNOSIS — I129 Hypertensive chronic kidney disease with stage 1 through stage 4 chronic kidney disease, or unspecified chronic kidney disease: Secondary | ICD-10-CM | POA: Insufficient documentation

## 2024-05-24 DIAGNOSIS — F325 Major depressive disorder, single episode, in full remission: Secondary | ICD-10-CM | POA: Insufficient documentation

## 2024-05-24 DIAGNOSIS — N183 Chronic kidney disease, stage 3 unspecified: Secondary | ICD-10-CM | POA: Insufficient documentation

## 2024-05-24 DIAGNOSIS — I152 Hypertension secondary to endocrine disorders: Secondary | ICD-10-CM | POA: Insufficient documentation

## 2024-05-24 DIAGNOSIS — E1169 Type 2 diabetes mellitus with other specified complication: Secondary | ICD-10-CM | POA: Insufficient documentation

## 2024-05-24 DIAGNOSIS — E119 Type 2 diabetes mellitus without complications: Secondary | ICD-10-CM | POA: Insufficient documentation

## 2024-05-24 DIAGNOSIS — Z79899 Other long term (current) drug therapy: Secondary | ICD-10-CM | POA: Insufficient documentation

## 2024-05-24 DIAGNOSIS — F419 Anxiety disorder, unspecified: Secondary | ICD-10-CM | POA: Insufficient documentation

## 2024-05-24 DIAGNOSIS — E669 Obesity, unspecified: Secondary | ICD-10-CM | POA: Insufficient documentation

## 2024-05-24 DIAGNOSIS — K219 Gastro-esophageal reflux disease without esophagitis: Secondary | ICD-10-CM | POA: Insufficient documentation

## 2024-05-24 DIAGNOSIS — Z6836 Body mass index (BMI) 36.0-36.9, adult: Secondary | ICD-10-CM | POA: Insufficient documentation

## 2024-05-24 DIAGNOSIS — E1159 Type 2 diabetes mellitus with other circulatory complications: Secondary | ICD-10-CM | POA: Insufficient documentation

## 2024-05-24 DIAGNOSIS — Z7985 Long-term (current) use of injectable non-insulin antidiabetic drugs: Secondary | ICD-10-CM | POA: Insufficient documentation

## 2024-05-24 DIAGNOSIS — E1122 Type 2 diabetes mellitus with diabetic chronic kidney disease: Secondary | ICD-10-CM | POA: Insufficient documentation

## 2024-05-24 DIAGNOSIS — E785 Hyperlipidemia, unspecified: Secondary | ICD-10-CM | POA: Insufficient documentation

## 2024-05-24 MED ORDER — DIAZEPAM 5 MG TABLET
5.0000 mg | ORAL_TABLET | Freq: Once | ORAL | 0 refills | Status: AC
Start: 2024-05-24 — End: 2024-05-24

## 2024-05-24 MED ORDER — FLUOXETINE 20 MG CAPSULE
20.0000 mg | ORAL_CAPSULE | Freq: Two times a day (BID) | ORAL | 0 refills | Status: DC
Start: 2024-05-24 — End: 2024-05-26

## 2024-05-24 MED ORDER — EZETIMIBE 10 MG TABLET
10.0000 mg | ORAL_TABLET | Freq: Every evening | ORAL | 1 refills | Status: AC
Start: 2024-05-24 — End: ?

## 2024-05-24 MED ORDER — OMEPRAZOLE 40 MG CAPSULE,DELAYED RELEASE
40.0000 mg | DELAYED_RELEASE_CAPSULE | Freq: Every day | ORAL | 1 refills | Status: DC
Start: 2024-05-24 — End: 2024-10-13

## 2024-05-25 ENCOUNTER — Telehealth (INDEPENDENT_AMBULATORY_CARE_PROVIDER_SITE_OTHER): Payer: Self-pay | Admitting: Family Medicine

## 2024-05-25 ENCOUNTER — Ambulatory Visit: Attending: Family Medicine

## 2024-05-25 DIAGNOSIS — E119 Type 2 diabetes mellitus without complications: Secondary | ICD-10-CM

## 2024-05-25 NOTE — Telephone Encounter (Signed)
 DR. Arlester Ladd OFFICE CALLED & STATING THAT Joanna Black WAS SUPPOSE TO GET A HGA1C DONE YESTERDAY BEFORE SHE HAS SURGERY & Karalee WAS SUPPOSE TO ASK FOR THAT TO BE DONE YESTERDAY WHEN She WAS HERE.

## 2024-05-25 NOTE — Nursing Note (Signed)
 Patient came in per Dr. Arneta Lango to get POCT A1C done. A1C is 7.2. Fredda Jacobus, [place]  05/25/2024 11:49

## 2024-05-25 NOTE — Nursing Note (Signed)
 05/25/24 1100   A1C   Time Performed 1140   A1C 7.2   Initials RSW

## 2024-05-25 NOTE — Telephone Encounter (Signed)
 If possible please add patient as nurse visit and she can come in. We can do A1C here. Thank you.

## 2024-05-26 ENCOUNTER — Other Ambulatory Visit (INDEPENDENT_AMBULATORY_CARE_PROVIDER_SITE_OTHER): Payer: Self-pay | Admitting: Family Medicine

## 2024-05-26 ENCOUNTER — Encounter (HOSPITAL_COMMUNITY): Payer: Self-pay | Admitting: Student in an Organized Health Care Education/Training Program

## 2024-05-26 DIAGNOSIS — F325 Major depressive disorder, single episode, in full remission: Secondary | ICD-10-CM

## 2024-05-26 DIAGNOSIS — F419 Anxiety disorder, unspecified: Secondary | ICD-10-CM

## 2024-05-26 MED ORDER — FLUOXETINE 40 MG CAPSULE
40.0000 mg | ORAL_CAPSULE | Freq: Every day | ORAL | 1 refills | Status: DC
Start: 2024-05-26 — End: 2024-07-19

## 2024-05-31 ENCOUNTER — Telehealth (HOSPITAL_BASED_OUTPATIENT_CLINIC_OR_DEPARTMENT_OTHER): Payer: Self-pay | Admitting: Emergency Medicine

## 2024-05-31 NOTE — Telephone Encounter (Signed)
 Called patient to schedule LEFT L4/5 LESI 06/08/24 at 1145 arrive at 1030 at [facility]. Follow up 07/01/24 at 245 with [facility].     Went over procedure instructions with patient who verbalized understanding and had no further questions. Verified insurance [employer] and medication holds,  NONE. Mailed instructions and appointment reminders to patient.       Joanna Black

## 2024-06-02 ENCOUNTER — Ambulatory Visit (HOSPITAL_COMMUNITY): Admitting: General Acute Care Hospital

## 2024-06-02 ENCOUNTER — Encounter (HOSPITAL_COMMUNITY): Payer: Self-pay | Admitting: Student in an Organized Health Care Education/Training Program

## 2024-06-02 ENCOUNTER — Other Ambulatory Visit (HOSPITAL_BASED_OUTPATIENT_CLINIC_OR_DEPARTMENT_OTHER): Payer: Self-pay | Admitting: Student in an Organized Health Care Education/Training Program

## 2024-06-02 ENCOUNTER — Encounter (HOSPITAL_COMMUNITY)
Admission: RE | Disposition: A | Discharge status: Home / Self Care | Payer: Self-pay | Source: Ambulatory Visit | Attending: Student in an Organized Health Care Education/Training Program

## 2024-06-02 ENCOUNTER — Other Ambulatory Visit: Payer: Self-pay

## 2024-06-02 ENCOUNTER — Ambulatory Visit
Admission: RE | Admit: 2024-06-02 | Discharge: 2024-06-02 | Disposition: A | Discharge status: Home / Self Care | Payer: Medicare PPO | Source: Ambulatory Visit | Attending: Student in an Organized Health Care Education/Training Program | Admitting: Student in an Organized Health Care Education/Training Program

## 2024-06-02 DIAGNOSIS — I2510 Atherosclerotic heart disease of native coronary artery without angina pectoris: Secondary | ICD-10-CM | POA: Insufficient documentation

## 2024-06-02 DIAGNOSIS — R1310 Dysphagia, unspecified: Secondary | ICD-10-CM | POA: Insufficient documentation

## 2024-06-02 DIAGNOSIS — M539 Dorsopathy, unspecified: Secondary | ICD-10-CM | POA: Insufficient documentation

## 2024-06-02 DIAGNOSIS — K3189 Other diseases of stomach and duodenum: Secondary | ICD-10-CM | POA: Insufficient documentation

## 2024-06-02 DIAGNOSIS — K449 Diaphragmatic hernia without obstruction or gangrene: Secondary | ICD-10-CM | POA: Insufficient documentation

## 2024-06-02 DIAGNOSIS — F40240 Claustrophobia: Secondary | ICD-10-CM | POA: Insufficient documentation

## 2024-06-02 DIAGNOSIS — I509 Heart failure, unspecified: Secondary | ICD-10-CM | POA: Insufficient documentation

## 2024-06-02 DIAGNOSIS — K222 Esophageal obstruction: Secondary | ICD-10-CM

## 2024-06-02 DIAGNOSIS — E785 Hyperlipidemia, unspecified: Secondary | ICD-10-CM | POA: Insufficient documentation

## 2024-06-02 DIAGNOSIS — E669 Obesity, unspecified: Secondary | ICD-10-CM | POA: Insufficient documentation

## 2024-06-02 DIAGNOSIS — M1990 Unspecified osteoarthritis, unspecified site: Secondary | ICD-10-CM | POA: Insufficient documentation

## 2024-06-02 DIAGNOSIS — K319 Disease of stomach and duodenum, unspecified: Secondary | ICD-10-CM

## 2024-06-02 DIAGNOSIS — Z794 Long term (current) use of insulin: Secondary | ICD-10-CM | POA: Insufficient documentation

## 2024-06-02 DIAGNOSIS — N1831 Chronic kidney disease, stage 3a: Secondary | ICD-10-CM | POA: Insufficient documentation

## 2024-06-02 DIAGNOSIS — Z7952 Long term (current) use of systemic steroids: Secondary | ICD-10-CM | POA: Insufficient documentation

## 2024-06-02 DIAGNOSIS — E1122 Type 2 diabetes mellitus with diabetic chronic kidney disease: Secondary | ICD-10-CM | POA: Insufficient documentation

## 2024-06-02 DIAGNOSIS — R0602 Shortness of breath: Secondary | ICD-10-CM | POA: Insufficient documentation

## 2024-06-02 DIAGNOSIS — Z6836 Body mass index (BMI) 36.0-36.9, adult: Secondary | ICD-10-CM | POA: Insufficient documentation

## 2024-06-02 DIAGNOSIS — K219 Gastro-esophageal reflux disease without esophagitis: Secondary | ICD-10-CM | POA: Insufficient documentation

## 2024-06-02 DIAGNOSIS — R0609 Other forms of dyspnea: Secondary | ICD-10-CM | POA: Insufficient documentation

## 2024-06-02 DIAGNOSIS — F32A Depression, unspecified: Secondary | ICD-10-CM | POA: Insufficient documentation

## 2024-06-02 DIAGNOSIS — J4489 Other specified chronic obstructive pulmonary disease: Secondary | ICD-10-CM | POA: Insufficient documentation

## 2024-06-02 DIAGNOSIS — I130 Hypertensive heart and chronic kidney disease with heart failure and stage 1 through stage 4 chronic kidney disease, or unspecified chronic kidney disease: Secondary | ICD-10-CM | POA: Insufficient documentation

## 2024-06-02 DIAGNOSIS — R0601 Orthopnea: Secondary | ICD-10-CM | POA: Insufficient documentation

## 2024-06-02 HISTORY — DX: Polymyalgia rheumatica: M35.3

## 2024-06-02 LAB — POC BLOOD GLUCOSE (RESULTS): GLUCOSE, POC: 127 mg/dL — ABNORMAL HIGH (ref 70–110)

## 2024-06-02 SURGERY — GASTROSCOPY WITH DILATION
Anesthesia: General | Site: Mouth | Laterality: Left | Wound class: Clean Contaminated Wounds-The respiratory, GI, Genital, or urinary

## 2024-06-02 MED ORDER — LACTATED RINGERS INTRAVENOUS SOLUTION
INTRAVENOUS | Status: DC
Start: 2024-06-02 — End: 2024-06-02
  Administered 2024-06-02: 1000 mL via INTRAVENOUS

## 2024-06-02 MED ORDER — SIMETHICONE 40 MG/0.6 ML ORAL DROPS,SUSPENSION
Freq: Once | ORAL | Status: DC | PRN
Start: 2024-06-02 — End: 2024-06-02
  Administered 2024-06-02: 40 mg

## 2024-06-02 MED ORDER — PROPOFOL 10 MG/ML IV BOLUS
INJECTION | Freq: Once | INTRAVENOUS | Status: DC | PRN
Start: 2024-06-02 — End: 2024-06-02
  Administered 2024-06-02: 80 mg via INTRAVENOUS
  Administered 2024-06-02: 40 mg via INTRAVENOUS
  Administered 2024-06-02: 30 mg via INTRAVENOUS

## 2024-06-02 MED ORDER — LIDOCAINE (PF) 100 MG/5 ML (2 %) INTRAVENOUS SYRINGE
INJECTION | Freq: Once | INTRAVENOUS | Status: DC | PRN
Start: 2024-06-02 — End: 2024-06-02
  Administered 2024-06-02: 100 mg via INTRAVENOUS

## 2024-06-02 MED ORDER — SODIUM CHLORIDE 0.9 % (FLUSH) INJECTION SYRINGE
3.0000 mL | INJECTION | Freq: Three times a day (TID) | INTRAMUSCULAR | Status: DC
Start: 2024-06-02 — End: 2024-06-02

## 2024-06-02 SURGICAL SUPPLY — 17 items
BLOCK BITE 60FR ADULT STRAP FLXB SH LF  DISP (ENDOSCOPIC SUPPLIES) ×1 IMPLANT
CATH ELHMST INJ GLD PROBE 7FR 25GA .24MM 210CM BIPOLAR RND DIST TIP STD CONN INTGR DISP 2.8MM MN WRK (ENDOSCOPIC SUPPLIES) IMPLANT
CLIP HMST MR CONDITIONAL BRD CATH ROT CONTROL KNOB NO SHEATH RSL 360 235CM 2.8MM 11MM OPN (ENDOSCOPIC SUPPLIES) IMPLANT
DILATOR ENDOS CRE 180CM 5.5CM 18-19-20MM 7.5FR ESOPH PYL COLON BAL LOW PROF GW PEBAX STRL LF  DISP (ENDOSCOPIC SUPPLIES) ×1 IMPLANT
FORCEPS BIOPSY HOT 240CM 2.2MM RJ 4 +2.8MM DISPO (ENDOSCOPIC SUPPLIES) IMPLANT
FORCEPS BIOPSY LRG CPC NEEDLE 240CM 2.2MM RJ 3 DISP ORNG (ENDOSCOPIC SUPPLIES) ×1 IMPLANT
FORCEPS BIOPSY NEEDLE 160CM 1.8MM RJ 4 DISP YW 2MM WRK CHNL GSPED (MED SURG SUPPLIES) IMPLANT
FORCEPS BIOPSY NEEDLE 240CM RJ 4 JMB (ENDOSCOPIC SUPPLIES) IMPLANT
GW ENDOSCOPIC .035IN 260CM DREAMWIRE ANG RX EGLD NITINOL BIL STRL DISP (ENDOSCOPIC SUPPLIES) IMPLANT
KIT RM TURNOVER STPC DISP (DRAPE/PACKS/SHEETS/OR TOWEL) ×1 IMPLANT
LIGATOR 2.8MM 8.6-11.5MM SSS7 ESOPH 1 STNG MLT BAND HNDL STRL DISP ENDOS HMSTS LF (ENDOSCOPIC SUPPLIES) IMPLANT
NEEDLE SCLRTX 25GA 2.3MM BVL STRL DISP STAR CATH INTJCT 4MM 240CM (ENDOSCOPIC SUPPLIES) IMPLANT
NET SPEC RETR 230CM 2.5MM RTHNT STD SHEATH 6X3CM NONST LF  DISP (ENDOSCOPIC SUPPLIES) IMPLANT
PROBE ESURG 220CM 2.3MM FIAPC FLXB STR FIRE STRL DISP (SURGICAL CUTTING SUPPLIES) IMPLANT
SNARE 230CM 2.5MM 3CM PLPK ROTR RTHNT ENDOS NONST LF  DISP (ENDOSCOPIC SUPPLIES) IMPLANT
SNARE MED OVAL 240CM 2.4MM CAPTIVATR STF ENDOS PLYP 27MM DISP (ENDOSCOPIC SUPPLIES) IMPLANT
SYRINGE INFLAT ALN II GA STRL DISP 60ML (ENDOSCOPIC SUPPLIES) ×1 IMPLANT

## 2024-06-02 NOTE — H&P (Signed)
 [facility]  Operating Room H&P/Update    Name: Joanna Black  Age: 78 y.o.    Primary Care Provider:  Lenward Railing, DO    HPI:    Joanna Black is a pleasent 78 y.o. female with past medical history as mentioned below is in the endoscopic unit for further evaluation of      Dysphagia to solid and liquid improved s/p dilation. EGD on 05/2022 showed Schatzki ring.  Repeated EGD October 2023 with dilatation of Schatzki's ring from 18 mm to 20 mm, patient denies current dysphagia  3 cm hiatal hernia, Hill grade  Patient has recent worsening bloating and belching likely secondary to hiatal hernia, symptoms improved by increasing omeprazole  20 mg p.o. b.i.d.  Gastroesophageal reflux disease without esophagitis currently controlled with omeprazole  20 mg p.o. b.i.d.    Patient is planned for EGD today as previously recommended. There is no change in interval history from prior visit.         Review of Systems:  Constitutional: Denies fevers, chills  Cardiovascular: Denies chest pain  Neurological: Denies seizures    Past Medical History  Past Medical History:   Diagnosis Date    Arthritis     Asthma     Back problem     BMI 35.0-35.9,adult 02/16/2020    CAD (coronary artery disease)     mild    Cataract     Cataracts, bilateral     CHF (congestive heart failure)     Chronic bronchitis with emphysema (CMS HCC)     Chronic low back pain     Claustrophobia     Depression     Diabetes     Diabetes mellitus, type 2     Encounter for support and coordination of transition of care 11/16/2022    Esophageal reflux     Essential hypertension 01/28/2019    H/O urinary tract infection     HH (hiatus hernia)     History of kidney disease     states, low kidney functions.    Hypercholesterolemia     Hypertension     Hypertriglyceridemia     Type 2 diabetes mellitus          Past Surgical History:   Procedure Laterality Date    CATARACT EXTRACTION, BILATERAL Bilateral     bilateral lens implant    COLONOSCOPY       ENDOSCOPIC ULTRASOUND ESOPHGEAL  04/15/2023    ESOPHAGOGASTRODUODENOSCOPY      HX CARPAL TUNNEL RELEASE      HX CATARACT REMOVAL Right 12/30/2019    HX CATARACT REMOVAL Left 01/06/2020    HX CHOLECYSTECTOMY      HX EXPOSURE TO METAL SHAVINGS      HX HEART CATHETERIZATION      HX HYSTERECTOMY      HX OOPHORECTOMY      HX TAH AND BSO      HX TUBAL LIGATION      HX VEIN STRIPPING      Left leg         Family Medical History:       Problem Relation (Age of Onset)    Bone cancer Father    Diabetes Multiple family members    Hypertension (High Blood Pressure) Multiple family members    No Known Problems Mother    Stomach Cancer Paternal Grandmother            Social History     Socioeconomic History  Marital status: Married     Spouse name: Theodis Fiscal    Number of children: 3    Years of education: 13    Highest education level: High school graduate   Occupational History    Occupation: Retired   Tobacco Use    Smoking status: Never     Passive exposure: Never    Smokeless tobacco: Never   Vaping Use    Vaping status: Never Used   Substance and Sexual Activity    Alcohol use: No     Alcohol/week: 0.0 standard drinks of alcohol    Drug use: No    Sexual activity: Not Currently     Partners: Male   Other Topics Concern    Right hand dominant Yes    Ability to Walk 1 Flight of Steps without SOB/CP No    Ability To Do Own ADL's Yes   Social History Narrative    MARRIED.  Lives in single story home.  Heats home with electric and has well water .        April Haney, LPN  5/40/9811, 14:01     Social Determinants of Health     Financial Resource Strain: Low Risk  (04/13/2024)    Financial Resource Strain     SDOH Financial: No   Transportation Needs: Low Risk  (04/13/2024)    Transportation Needs     SDOH Transportation: No   Social Connections: Low Risk  (04/13/2024)    Social Connections     SDOH Social Isolation: 5 or more times a week   Intimate Partner Violence: Low Risk  (04/13/2024)    Intimate Partner Violence     SDOH  Domestic Violence: No   Housing Stability: Low Risk  (04/13/2024)    Housing Stability     SDOH Housing Situation: I have housing.     SDOH Housing Worry: No     @MEDSTAKING @  Allergies[1]    Examination:  There were no vitals taken for this visit.       Wt Readings from Last 2 Encounters:   05/24/24 96.9 kg (213 lb 9.6 oz)   04/19/24 96.5 kg (212 lb 11.9 oz)     Head: Atraumatic and normocephalic  Eyes: Conjunctiva clear, sclera non-icteric  Neck:  Supple  Lungs: Normal reparatory effort bilaterally  Cardiovascular: Regular rate  Neurologic: Alert,  Grossly normal  Psychiatric: Appears normal    CBC Results Coags Results   No results for input(s): WBC, HGB, HCT, PLTCNT, BANDS in the last 72 hours.    Invalid input(s): PLATELETCOUNT No results for input(s): INR, PROTHROMTME, APTT in the last 72 hours.    Invalid input(s): PTT, PT, CREACTPROTIE   BMP Results Other Chemistries Results   No results for input(s): SODIUM, POTASSIUM, CHLORIDE, CO2, BUN, CREATININE, GFR, ANIONGAP in the last 72 hours. No results for input(s): CALCIUM, ALBUMIN, MAGNESIUM , PHOSPHORUS in the last 72 hours.   Liver/Pancreas Enzyme Results Blood Gas     No results for input(s): TOTALPROTEIN, ALBUMIN, PREALBUMIN, AST, ALT, ALKPHOS, LDH, AMYLASE, LIPASE in the last 72 hours.    Invalid input(s): GGT No results found for this encounter     Assessment  Joanna Black is a 78 y.o. female is seen in the endoscopy unit for endoscopic procedures.    Plan  Informed consent was obtained.      Discussed with patient the consent for EGD. Alternative to endoscopy were also discussed. The risks/benefits and complications including but not limited to anesthesia, bleeding, dental  injury, aspiration, CV/pulmonary risk, perforation, infection, missed lesions, need for surgery & remote possibility of death were discussed with patient who understand and agreed to proceed.   Proceed to endoscopy  procedure as previously planned.     Charmaine Coop, MD  [facility] - Gastroenterology  [place], [place]  [address]         [1]   Allergies  Allergen Reactions    Amoxicillin  Other Adverse Reaction (Add comment)     Unknown reaction    Augmentin [Amoxicillin-Pot Clavulanate] Nausea/ Vomiting    Statins-Hmg-Coa Reductase Inhibitors Myalgia

## 2024-06-02 NOTE — Anesthesia Postprocedure Evaluation (Signed)
 Anesthesia Post Op Evaluation    Patient: Joanna Black  Procedure(s) with comments:  GASTROSCOPY WITH DILATION - diabetic    Last Vitals:Temperature: 36.7 C (98.1 F) (06/02/24 0811)  Heart Rate: 64 (06/02/24 0911)  BP (Non-Invasive): (!) 153/55 (06/02/24 0911)  Respiratory Rate: 20 (06/02/24 0911)  SpO2: 100 % (06/02/24 0911)    No notable events documented.    Patient is sufficiently recovered from the effects of anesthesia to participate in the evaluation and has returned to their pre-procedure level.  Patient location during evaluation: bedside       Patient participation: complete - patient participated  Level of consciousness: awake and alert and responsive to verbal stimuli    Pain score: 0  Pain management: adequate  Airway patency: patent    Anesthetic complications: no  Cardiovascular status: acceptable  Respiratory status: acceptable  Hydration status: acceptable  Patient post-procedure temperature: Pt Normothermic   PONV Status: Absent

## 2024-06-02 NOTE — Anesthesia Preprocedure Evaluation (Signed)
 ANESTHESIA PRE-OP EVALUATION  Planned Procedure: GASTROSCOPY WITH DILATION (Left: Mouth)  Review of Systems     anesthesia history negative     patient summary reviewed          Pulmonary   COPD, moderate, asthma, shortness of breath and rescue inhaler,  orthopnea ,  Cardiovascular    Hypertension, poorly controlled, CAD, CHF, DOE, ECG reviewed, 01/09/24    Stress Function Comments: Stress ejection fraction is 63%.    Normal myocardial perfusion study.    Post-stress ejection fraction was 63%.    Perfusion Comments: Normal myocardial perfusion study.      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  No Facial hair  No Beard        Dental           (+) partials, missing             Pulmonary      (+) decreased breath sounds present        Cardiovascular    Rhythm: regular  Rate: Normal  (-) no friction rub, carotid bruit is not present and no murmur     Other findings              Plan  ASA 3     Planned anesthesia type: general     general intravenous and total intravenous anesthesia                    Intravenous induction     Anesthesia issues/risks discussed are: PONV, Dental Injuries, Intraoperative Awareness/ Recall, Aspiration, Sore Throat, Cardiac Events/MI, Difficult Airway, Stroke and Blood Loss.  Anesthetic plan and risks discussed with patient  signed consent obtained          Patient's NPO status is appropriate for  Anesthesia.           Plan discussed with CRNA and physician.

## 2024-06-03 ENCOUNTER — Ambulatory Visit (INDEPENDENT_AMBULATORY_CARE_PROVIDER_SITE_OTHER): Admitting: Student in an Organized Health Care Education/Training Program

## 2024-06-04 ENCOUNTER — Other Ambulatory Visit (INDEPENDENT_AMBULATORY_CARE_PROVIDER_SITE_OTHER): Payer: Self-pay | Admitting: Student in an Organized Health Care Education/Training Program

## 2024-06-04 DIAGNOSIS — M353 Polymyalgia rheumatica: Secondary | ICD-10-CM

## 2024-06-04 MED ORDER — PREDNISONE 5 MG TABLET
5.0000 mg | ORAL_TABLET | Freq: Every day | ORAL | 0 refills | Status: DC
Start: 2024-06-04 — End: 2024-09-01

## 2024-06-04 MED ORDER — PREDNISONE 1 MG TABLET
ORAL_TABLET | ORAL | 1 refills | Status: DC
Start: 2024-06-04 — End: 2024-09-01

## 2024-06-04 NOTE — Telephone Encounter (Signed)
 Please reach out to patient to see what dose of prednisone  she is currently taking.  Patient should be on around 5 mg of prednisone  daily.  Will send in updated prednisone  prescription based on what dose she is currently taking. Thanks

## 2024-06-04 NOTE — Telephone Encounter (Signed)
 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  Labs were completed on 05/05/2024  and results are listed below.  The next scheduled appointment date is 07/16/2024.  [facility]  is requesting a refill on Prednisone  1 mg and 5 mg.  Message has been routed to Dr. Chandler Combs      Lab Results   Component Value Date/Time    WBC 10.0 05/05/2024 10:16 AM    HGB 12.3 05/05/2024 10:16 AM    HCT 39.4 05/05/2024 10:16 AM    PLTCNT 219 05/05/2024 10:16 AM    RBC 4.07 05/05/2024 10:16 AM    MCV 96.8 05/05/2024 10:16 AM    MCHC 31.2 05/05/2024 10:16 AM    MCH 30.2 05/05/2024 10:16 AM    RDW 18.6 (H) 08/18/2023 09:16 AM    MPV 11.8 05/05/2024 10:16 AM      Lab Results   Component Value Date    CREATININE 1.25 (H) 05/05/2024      Hepatic Function  Lab Results   Component Value Date    ALBUMIN 3.3 (L) 05/05/2024    TOTALPROTEIN 6.5 05/05/2024    ALKPHOS 72 05/05/2024    PROTHROMTME 13.5 (H) 10/29/2022    INR 1.19 10/29/2022    AST 14 05/05/2024    ALT 9 05/05/2024    BILIRUBINCON 0.3 10/29/2022     Margene Sheen Bernie Ransford, LPN  9/56/2130,  10:17

## 2024-06-04 NOTE — Telephone Encounter (Signed)
 Spoke with patient on the phone.  She states that she is currently take Prednisone  5mg  daily.      Margene Sheen Kyng Matlock, LPN  6/44/0347,  13:12

## 2024-06-08 ENCOUNTER — Encounter (HOSPITAL_COMMUNITY)
Admission: RE | Disposition: A | Discharge status: Home / Self Care | Payer: Self-pay | Source: Ambulatory Visit | Attending: Emergency Medicine

## 2024-06-08 ENCOUNTER — Inpatient Hospital Stay (HOSPITAL_COMMUNITY)
Admission: RE | Admit: 2024-06-08 | Discharge: 2024-06-08 | Disposition: A | Discharge status: Home / Self Care | Source: Ambulatory Visit

## 2024-06-08 ENCOUNTER — Ambulatory Visit
Admission: RE | Admit: 2024-06-08 | Discharge: 2024-06-08 | Disposition: A | Discharge status: Home / Self Care | Payer: Self-pay | Source: Ambulatory Visit | Attending: Emergency Medicine | Admitting: Emergency Medicine

## 2024-06-08 ENCOUNTER — Telehealth (HOSPITAL_BASED_OUTPATIENT_CLINIC_OR_DEPARTMENT_OTHER): Payer: Self-pay | Admitting: Student in an Organized Health Care Education/Training Program

## 2024-06-08 ENCOUNTER — Other Ambulatory Visit: Payer: Self-pay

## 2024-06-08 ENCOUNTER — Encounter (HOSPITAL_COMMUNITY): Payer: Self-pay | Admitting: Emergency Medicine

## 2024-06-08 ENCOUNTER — Other Ambulatory Visit (HOSPITAL_BASED_OUTPATIENT_CLINIC_OR_DEPARTMENT_OTHER): Payer: Self-pay | Admitting: Emergency Medicine

## 2024-06-08 DIAGNOSIS — R52 Pain, unspecified: Secondary | ICD-10-CM

## 2024-06-08 DIAGNOSIS — E119 Type 2 diabetes mellitus without complications: Secondary | ICD-10-CM | POA: Insufficient documentation

## 2024-06-08 DIAGNOSIS — M5416 Radiculopathy, lumbar region: Secondary | ICD-10-CM

## 2024-06-08 LAB — POC BLOOD GLUCOSE (RESULTS): GLUCOSE, POC: 114 mg/dL — ABNORMAL HIGH (ref 60–110)

## 2024-06-08 SURGERY — PAIN SERVICE BLOCK INJECTION CAUDAL EPIDURAL WITH IMAGING
Anesthesia: Local (Nurse-Monitored) | Laterality: Left | Wound class: Clean Wound: Uninfected operative wounds in which no inflammation occurred

## 2024-06-08 MED ORDER — METHYLPREDNISOLONE ACETATE 80 MG/ML SUSPENSION FOR INJECTION
Freq: Once | INTRAMUSCULAR | Status: DC | PRN
Start: 2024-06-08 — End: 2024-06-08
  Administered 2024-06-08: 80 mg via INTRAMUSCULAR

## 2024-06-08 MED ORDER — IOHEXOL 300 MG IODINE/ML INTRAVENOUS SOLUTION
Freq: Once | INTRAVENOUS | Status: DC | PRN
Start: 2024-06-08 — End: 2024-06-08
  Administered 2024-06-08: 30 mL via INTRATHECAL

## 2024-06-08 MED ORDER — SODIUM CHLORIDE 0.9 % INJECTION SOLUTION
Freq: Once | INTRAMUSCULAR | Status: DC | PRN
Start: 2024-06-08 — End: 2024-06-08
  Administered 2024-06-08: 10 mL via INTRAMUSCULAR

## 2024-06-08 MED ORDER — LIDOCAINE (PF) 10 MG/ML (1 %) INJECTION SOLUTION
Freq: Once | INTRAMUSCULAR | Status: DC | PRN
Start: 2024-06-08 — End: 2024-06-08
  Administered 2024-06-08: 30 mL via INTRAMUSCULAR

## 2024-06-08 SURGICAL SUPPLY — 5 items
APPL ISPRP CHG 10.5ML CHLRPRP HI-LT ORNG PREP STRL LF (MED SURG SUPPLIES) ×1 IMPLANT
GLOVE SURG 8 LF  PF SMOOTH BEAD CUF STRL CRM 11.8IN PROTEXIS PI MICRO PLISPRN THK7.9 MIL MICRO (GLOVES AND ACCESSORIES) ×1 IMPLANT
NEEDLE HYPO  30GA 1IN REG WL PRCSNGL POLYPROP REG BVL LL HUB DEHP-FR TAN STRL LF  DISP (MED SURG SUPPLIES) ×1 IMPLANT
SINGLE SHOT EPIDURAL TRAY QTY_20 (MED SURG SUPPLIES) ×1 IMPLANT
SYRINGE LL 3ML LF  STRL GRAD N-PYRG DEHP-FR PVC FREE MED DISP CLR (MED SURG SUPPLIES) ×1 IMPLANT

## 2024-06-08 NOTE — OR Surgeon (Signed)
 [facility]  [address]  [place] [address]  [phone]    PATIENT NAME: Joanna Black  CHART KGMWNU:[id number]  DATE OF BIRTH: 1946/09/01  DATE OF SERVICE:06/08/2024      PREOPERATIVE DIAGNOSIS: Problem List[1]  Lumbar Radiculopathy  POSTOPERATIVE DIAGNOSIS: SAME    Procedure: Epidural Steroid Injection Under Fluoroscopy at    L4-5    Attending: Serge Dancer, DO    Assistant: None    Anesthesia: Local    Estimated Blood Loss: None    Complication: None      Procedure:  After informed consent was obtained, patient was taken to the fluoroscopy suite and placed in a prone position.    TIMEOUT:  Correct patient? Yes  Correct site? Yes  Correct side? Yes  Correct position? Yes  Correct procedure? Yes  Correct medication? Yes  Site marked? Yes  H&P note completed? Yes  Consents verified? Yes  Relevant lab results available? Yes  Allergies reviewed? Yes  Is all required equipment for the procedure available? Yes  Is documentation verified? Yes  Is Time Out verified by doctor and nurse? Yes    The skin was prepped and draped in the usual sterile fashion using chlorhexidine. The skin and subcutaneous tissue was infiltrated with 1% Lidocaine  using a 27 gauge 1.5 inch needle.  An 20 gauge  Touhy needle was advanced with the aid of fluoroscopy using the loss of resistance technique at the L4-5 interspinous space using an intralaminer approach.  Proper placement into the epidural space was confirmed by the loss of resistance.  2cc of Omnipaque 250 was instilled after a negative aspiration.  A good epiduragram without vascular runoff was noted. There was no CSF, heme or paresthesias.  After negative aspiration, an injectate consisting of 5mL of a mixture containing 80mg DepoMedrol(methylprednisolone )  with of 1% Lidocaine  and 2ml NS was injected.  Needle was rinsed with 1cc of NS prior to removal.  There were no complications.  Patient tolerated the procedure well, all needles were removed  intact and a sterile bandage applied to the site.     Patient was observed in recovery and discharged home under supervision with written instructions in stable condition.    Serge Dancer, DO 06/08/2024, 11:55  Division of Pain Medicine       [1]   Patient Active Problem List  Diagnosis    Type 2 diabetes mellitus with stage 3a chronic kidney disease, without long-term current use of insulin  (CMS HCC)    Cervical pain (neck)    Thoracic back pain    Chronic bilateral low back pain    Essential hypertension    Cardiomyopathy, dilated, nonischemic (CMS HCC)    Chronic GERD    Chronic systolic congestive heart failure (CMS HCC)    BMI 35.0-35.9,adult    Mixed hyperlipidemia    CKD (chronic kidney disease) stage 3, 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/min    History of colon polyps    Colon cancer screening    Gastroesophageal reflux disease    Dysphagia    Schatzki's ring    Chronic diarrhea    Diverticulosis of colon    Hypoxia    Enteropathogenic Escherichia coli infection    Encounter for support and coordination of transition of care    Chronic anemia    Osteoporosis    IDA (iron  deficiency anemia)    Bloating    Belching

## 2024-06-08 NOTE — OR Nursing (Signed)
 Medications provided to sterile field. See MD notes for amounts administered.

## 2024-06-08 NOTE — Nursing Note (Signed)
 [place] Pain Rating Scale     On a scale of 0-10, during the past 24 hours, pain has interfered with you usual activity:   9/10    On a scale of 0-10, during the past 24 hours, pain has interfered with your sleep:  0/10    On a scale of 0-10, during the past 24 hours, pain has affected your mood:   5/10    On a scale of 0-10, during the past 24 hours, pain has contributed to your stress:   5/10    On a scale of 0-10, what is your overall pain Rating: 7/10

## 2024-06-08 NOTE — Telephone Encounter (Signed)
 Dr. Moira Andrews ordered an EUS for pt. With Dr Deana Eves    Pt is sched on 06/30/2024 .  Pt must be NPO after midnight.     --If they have any questions they may feel free to contact us  to discuss any questions they may have.    The day before procedure, PAT will call to confirm the exact time.  Times may change pending on the sx sched that day.    -Day of procedure pt aware they need to arrive  1.25 hours early on 2nd floor OPS with a driver.    -Pt will not be able to drive for 24 hrs.    -Doc will speak to the pt before and after procedure.    -Clearance needed?:   No    Instructions/education mailed: yes  Address confirmed in chart:  No      Other sched info:  Scope needed: Radial  FNA:  No  Cytololgy:  No      Additional comments:

## 2024-06-08 NOTE — Nursing Note (Signed)
 DVPRS OVERALL PAIN RATING POST PROCEDURE  Patient reports pain 7/10 while ambulating and 0/10 while sitting.     80% relief reported by patient post procedure.  Sandee Crook, RN  06/08/2024, 13:27

## 2024-06-08 NOTE — OR PostOp (Signed)
 Patient discharged home without distress via wheelchair. Discharge instructions discussed and provided. Patient's husband is driving her home.

## 2024-06-08 NOTE — Discharge Instructions (Signed)
 [facility]  AT [facility] - [place], [place] [address] - Phone: [phone])      Complications (Reasons to call):  Temperature greater than 101 degrees  Unusual redness or swelling at the injection site  Persistent nausea or vomiting or headache  Persistent weakness, numbness or bleeding  Loss of bowel or bladder control  If you are unable to reach your physician and your symptoms are severe, have yourself brought to the nearest Emergency Department or call 911    You may experience:  You may also experience a temporary increase in the level of your pain  You may experience weakness, tingling or heavy feelings in your legs the first few hours after your procedure, requiring you to be cautious when ambulating (walking).   Do NOT drive a car or operate heavy machinery for 24 hours after your procedure    Medications:  Most medications held for your procedure may be resumed one day after the procedure. Please ask your physician if you have questions about specific medications or the procedure itself.    Comfort measures:  You may use ice over the injection site  AVOID HEAT for the first 24 hours  After that ice or heat may be used as needed. DO NOT apply LONGER than 20 minutes and wait 20 minutes before reapplication  Avoid sitting in a bathtub, hot tub, pool, etc. for 3 days after your procedure    Activity:  Rest at home for the next 24 hours  Then increase activity as tolerated  Typically it is ok to return to work one day after the procedure

## 2024-06-09 ENCOUNTER — Telehealth (HOSPITAL_BASED_OUTPATIENT_CLINIC_OR_DEPARTMENT_OTHER): Payer: Self-pay | Admitting: Emergency Medicine

## 2024-06-09 NOTE — Telephone Encounter (Signed)
 Called to see how patient was doing after their LESI Left injection on 06/08/24 with Dr. Katheryne Pane, and what percent of relief they had. No answer, left message to return our call  Joanna Black

## 2024-06-15 ENCOUNTER — Other Ambulatory Visit: Payer: Self-pay

## 2024-06-15 ENCOUNTER — Ambulatory Visit: Payer: Self-pay | Attending: Physician Assistant | Admitting: Physician Assistant

## 2024-06-15 ENCOUNTER — Encounter (HOSPITAL_COMMUNITY): Payer: Self-pay | Admitting: Physician Assistant

## 2024-06-15 VITALS — BP 134/86 | HR 87 | Ht 64.0 in | Wt 207.0 lb

## 2024-06-15 DIAGNOSIS — I10 Essential (primary) hypertension: Secondary | ICD-10-CM | POA: Insufficient documentation

## 2024-06-15 DIAGNOSIS — I517 Cardiomegaly: Secondary | ICD-10-CM | POA: Insufficient documentation

## 2024-06-15 DIAGNOSIS — I491 Atrial premature depolarization: Secondary | ICD-10-CM | POA: Insufficient documentation

## 2024-06-15 DIAGNOSIS — I6523 Occlusion and stenosis of bilateral carotid arteries: Secondary | ICD-10-CM | POA: Insufficient documentation

## 2024-06-15 DIAGNOSIS — N1831 Chronic kidney disease, stage 3a: Secondary | ICD-10-CM | POA: Insufficient documentation

## 2024-06-15 DIAGNOSIS — Z789 Other specified health status: Secondary | ICD-10-CM | POA: Insufficient documentation

## 2024-06-15 DIAGNOSIS — Z8249 Family history of ischemic heart disease and other diseases of the circulatory system: Secondary | ICD-10-CM | POA: Insufficient documentation

## 2024-06-15 DIAGNOSIS — I130 Hypertensive heart and chronic kidney disease with heart failure and stage 1 through stage 4 chronic kidney disease, or unspecified chronic kidney disease: Secondary | ICD-10-CM | POA: Insufficient documentation

## 2024-06-15 DIAGNOSIS — I6502 Occlusion and stenosis of left vertebral artery: Secondary | ICD-10-CM | POA: Insufficient documentation

## 2024-06-15 DIAGNOSIS — I444 Left anterior fascicular block: Secondary | ICD-10-CM | POA: Insufficient documentation

## 2024-06-15 DIAGNOSIS — E1122 Type 2 diabetes mellitus with diabetic chronic kidney disease: Secondary | ICD-10-CM | POA: Insufficient documentation

## 2024-06-15 DIAGNOSIS — E785 Hyperlipidemia, unspecified: Secondary | ICD-10-CM | POA: Insufficient documentation

## 2024-06-15 DIAGNOSIS — I5032 Chronic diastolic (congestive) heart failure: Secondary | ICD-10-CM | POA: Insufficient documentation

## 2024-06-15 DIAGNOSIS — I452 Bifascicular block: Secondary | ICD-10-CM | POA: Insufficient documentation

## 2024-06-15 DIAGNOSIS — I5030 Unspecified diastolic (congestive) heart failure: Secondary | ICD-10-CM | POA: Insufficient documentation

## 2024-06-15 DIAGNOSIS — Z7985 Long-term (current) use of injectable non-insulin antidiabetic drugs: Secondary | ICD-10-CM | POA: Insufficient documentation

## 2024-06-15 DIAGNOSIS — Z79899 Other long term (current) drug therapy: Secondary | ICD-10-CM | POA: Insufficient documentation

## 2024-06-15 DIAGNOSIS — I779 Disorder of arteries and arterioles, unspecified: Secondary | ICD-10-CM | POA: Insufficient documentation

## 2024-06-15 DIAGNOSIS — I4510 Unspecified right bundle-branch block: Secondary | ICD-10-CM | POA: Insufficient documentation

## 2024-06-15 NOTE — Progress Notes (Signed)
 [facility]  [address]  [place], [place] [address]  Phone:  [phone]  Fax:  [phone]    CARDIOLOGY FOLLOW-UP    Name: Joanna Black                       Date of Birth: 24-Oct-1946   MRN:  [id number]                         Date of visit: 06/15/2024     PCP: Morse Picker, DO     Chief Complaint    Follow Up       Subjective  Joanna Black is a 78 y.o. year old female who presents for Follow Up   to clinic.  She has a past medical history of GERD, anxiety/depression, hypertension, chronic kidney disease, HFpEF, hyperlipidemia with statin intolerance, and diabetes.  She is also with past medical history of lung disease, polymyalgia rheumatica, and plasma cell dyscracia.  She has followed with vascular surgery, Dr. 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  She denies history of known coronary artery disease.  No history of cardiac stents or bypass.  No history of heart attack.  No history of stroke.  No history of rheumatic fever or scarlet fever.  No history of liver disease.  In regards to her social history, no tobacco use, alcohol use, or illicit drug use.  In regards to her family history she has 2 brothers both with history of coronary artery disease.  One brother has history of heart attack and another brother with history of cardiac stents.  She does have drug allergies to multiple statins causing myalgias.  She has tried multiple statins in the past and cannot tolerate these.  At 1 point she was on pravastatin in September 2015.  Otherwise, she cannot recall the other means of the statins that she has tried.  She also has drug allergies to Augmentin causing nausea and vomiting and amoxicillin.      She notes that she is a history of CHF diagnosed over 7 years ago she was in [place] .  She notes they would stay there in the winters.  She began having shortness of breath and her oxygen  saturation was low.  She was also with a lot of fluid on board.  She notes she was  hospitalized for about 3 days and was treated with medications for breathing improvement and fluid removal.     Patient's most recent cardiac testing includes stress testing 01/09/2024.  This noted preserved ejection fraction, calculated, 63% with no reversible ischemia.  Patient underwent carotid artery ultrasound 10/28/2023.  This noted less than 50% right and left internal carotid artery stenosis and external carotid artery stenosis.  Antegrade flow noted bilateral vertebral arteries.  Echocardiogram 10/28/2023 noted preserved ejection fraction of 60% with mild MR and mild TR.  Patient had normal RVSP. CTA of the head and neck was ordered.  She notes that she did try to get this done on 2 separate occasions, but her kidney function was too poor for them to do this study.  Ultrasound upper extremities 04/23/2023 noted no hemodynamic significant arterial disease in bilateral upper extremities at rest.     Patient was followed with vascular surgery, Dr. Rod, concerns for giant cell arteritis.  Upper extremity ultrasounds 04/23/2023 performed as above, no significant obstructive hemodynamic significant arterial stenosis bilateral upper extremities.  Patient has been on prednisone  taper  in the past.  CTA head and neck was recommended, however, I believe this could not be performed due to renal status.  Per patient's medication list, she remains on prednisone  at this time.    Patient presents today for routine cardiac follow-up.  From a cardiac standpoint, patient doing well.  She denies chest pain, syncope, or worsening dyspnea on exertion.  No palpitation complaints.  No further cardiac complaints/concerns.  Recent lab work as below.  Further review of systems as below.    Recent Lab Results:  Last CBC  (Last result in the past 2 years)        WBC   HGB   HCT   MCV   Platelets      05/05/24 1016 10.0   12.3   39.4   96.8   219            Last BMP  (Last result in the past 2 years)        Na   K   Cl   CO2   BUN    Cr   Calcium   Glucose   Glucose-Fasting        05/05/24 1016 140   4.5   104   27   15   1.25   8.7  Comment: Gadolinium-containing contrast can interfere with calcium measurement.     124               Last Hepatic Panel  (Last result in the past 2 years)        Albumin   Total PTN   Total Bili   Direct Bili   Ast/SGOT   Alt/SGPT   Alk Phos        05/05/24 1016 3.3   6.5   0.3  Comment: Naproxen  therapy can falsely elevate total bilirubin levels.     14   9   72             Lab Results   Component Value Date    TSH 1.505 05/05/2024     Lab Results   Component Value Date    TRIG 231 (H) 05/05/2024    HDLCHOL 46 (L) 05/05/2024    LDLCHOL 124 (H) 05/05/2024    CHOLESTEROL 211 (H) 05/05/2024     HEMOGLOBIN A1C   Date Value Ref Range Status   08/18/2023 7.1 (H) 4.8 - 6.2 % Final   01/23/2023 6.4 (H) 4.8 - 6.2 % Final   12/10/2022 7.6  Final   03/13/2022 5.9  Final   11/29/2021 6.3 (H) 4.8 - 6.2 % Final     05/05/2024:   Magnesium  level 2.1.    Patient Active Problem List    Diagnosis Date Noted    IDA (iron  deficiency anemia) 12/11/2023    Bloating 12/11/2023    Belching 12/11/2023    Osteoporosis 08/14/2023    Chronic anemia 05/01/2023    Encounter for support and coordination of transition of care 11/16/2022     Hospitalization from November 7th through November 01, 2022.       Enteropathogenic Escherichia coli infection 11/01/2022    Hypoxia 10/29/2022    History of colon polyps 04/21/2022    Colon cancer screening 04/21/2022    Gastroesophageal reflux disease 04/21/2022    Dysphagia 04/21/2022    Schatzki's ring 04/21/2022    Chronic diarrhea 04/21/2022    Diverticulosis of colon 04/21/2022    CKD (chronic kidney disease) stage 3, 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/min  11/29/2021    Mixed hyperlipidemia 04/27/2020     She is willing to try a non-statin RX for her lipids.    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    Chronic systolic congestive heart failure (CMS HCC) 03/21/2016    Chronic GERD 02/20/2016      Past Medical History:   Diagnosis Date    Arthritis     Asthma     Back problem     BMI 35.0-35.9,adult 02/16/2020    CAD (coronary artery disease)     mild    Cataract     Cataracts, bilateral     CHF (congestive heart failure)     Chronic bronchitis with emphysema (CMS HCC)     Chronic low back pain     Claustrophobia     Depression     Diabetes     Diabetes mellitus, type 2     Encounter for support and coordination of transition of care 11/16/2022    Esophageal reflux     Essential hypertension 01/28/2019    H/O urinary tract infection     HH (hiatus hernia)     History of kidney disease     states, low kidney functions.    Hypercholesterolemia     Hypertension     Hypertriglyceridemia     Polymyalgia rheumatica (CMS HCC)     Type 2 diabetes mellitus      Allergies   Allergen Reactions    Amoxicillin  Other Adverse Reaction (Add comment)     Unknown reaction    Augmentin [Amoxicillin-Pot Clavulanate] Nausea/ Vomiting    Statins-Hmg-Coa Reductase Inhibitors Myalgia     MEDICATIONS  Outpatient Medications Prior to Visit   Medication Sig Dispense Refill    acetaminophen  (TYLENOL ) 500 mg Oral Tablet Take 1 Tablet (500 mg total) by mouth Every night as needed Takes 2 at night      albuterol  sulfate (PROVENTIL  OR VENTOLIN  OR PROAIR ) 90 mcg/actuation Inhalation oral inhaler Take 1-2 Puffs by inhalation Every 6 hours as needed 1 Each 3    albuterol  sulfate (PROVENTIL ) 2.5 mg /3 mL (0.083 %) Inhalation nebulizer solution Take 3 mL (2.5 mg total) by nebulization Three times a day as needed for Wheezing 300 mL 5    calcium carbonate/vitamin D3 (VITAMIN D -3 ORAL) Take 2,000 Int'l Units/day by  mouth Daily      ezetimibe  (ZETIA ) 10 mg Oral Tablet Take 1 Tablet (10 mg total) by mouth Every evening 90 Tablet 1    FLUoxetine  (PROZAC ) 40 mg Oral Capsule Take 1 Capsule (40 mg total) by mouth Daily 90 Capsule 1    furosemide  (LASIX ) 20 mg Oral Tablet TAKE 1 TABLET (20 MG TOTAL) BY MOUTH TWICE PER DAY AS NEEDED 180 Tablet 1    magnesium  oxide (MAG-OX) 400 mg Oral Tablet Take 1 Tablet (400 mg total) by mouth Three times a day (Patient not taking: Reported on 06/15/2024) 30 Tablet 0    metoprolol  succinate (TOPROL -XL) 25 mg Oral Tablet Sustained Release 24 hr TAKE 1 TABLET BY MOUTH EVERY NIGHT. 90 Tablet 1  omeprazole  (PRILOSEC) 40 mg Oral Capsule, Delayed Release(E.C.) Take 1 Capsule (40 mg total) by mouth Daily Indications: gastroesophageal reflux disease 90 Capsule 1    predniSONE  (DELTASONE ) 1 mg Oral Tablet Start taking 4 mg daily after the 5 mg prescription has been finished.  Reduce daily prednisone  dose by 1 mg every 3 weeks. (Patient not taking: Reported on 06/15/2024) 120 Tablet 1    predniSONE  (DELTASONE ) 5 mg Oral Tablet Take 1 Tablet (5 mg total) by mouth Daily 21 Tablet 0    tiZANidine  (ZANAFLEX ) 4 mg Oral Tablet TAKE 1 TABLET BY MOUTH THREE TIMES A DAY AS NEEDED FOR MUSCLE CRAMPS 270 Tablet 1    TRULICITY  0.75 mg/0.5 mL Subcutaneous Pen Injector INJECT THE CONTENTS OF ONE PEN  SUBCUTANEOUSLY WEEKLY AS  DIRECTED 6 mL 3     No facility-administered medications prior to visit.     REVIEW OF SYSTEMS:   General: No fever or chills.  HEENT: No vision changes or issues with bleeding from ears, nose, or throat.    Cardiac: No chest pain, tightness, heaviness, or discomfort. No palpitations.   Resp: No worsening dyspnea at rest or with exertion. No cough, hemoptysis, or paroxysmal nocturnal dyspnea/orthopnea.   GI: No N/V/D, melena, or bright red blood per rectum.  Ext: No lower extremity edema or claudication.    Neuro: No lightheadedness, dizziness, presyncope, syncope, focal weakness, numbness, or  tingling.  All other ROS negative    Objective: BP 134/86   Pulse 87   Ht 1.626 m (5' 4)   Wt 93.9 kg (207 lb)   SpO2 96%   BMI 35.53 kg/m        Physical Exam  Vitals and nursing note reviewed.   Constitutional:       General: She is not in acute distress.     Appearance: Normal appearance. She is not ill-appearing.      Comments: Sitting in bedside chair.   HENT:      Head: Atraumatic.   Eyes:      Conjunctiva/sclera: Conjunctivae normal.   Neck:      Vascular: No carotid bruit.   Cardiovascular:      Rate and Rhythm: Normal rate. Rhythm irregular.      Heart sounds: Normal heart sounds. No murmur heard.     No friction rub. No gallop.   Pulmonary:      Effort: Pulmonary effort is normal.      Breath sounds: Normal breath sounds. No wheezing, rhonchi or rales.   Musculoskeletal:      Right lower leg: No edema.      Left lower leg: No edema.   Skin:     General: Skin is warm and dry.      Findings: No bruising.   Neurological:      General: No focal deficit present.      Mental Status: She is alert and oriented to person, place, and time.   Psychiatric:         Mood and Affect: Mood normal.     EKG today noting SR at 77bpm with incomplete RBBB and LAFB and PACs. No acute ST/T wave changes. LVH with repolarization abnormality noted. Prior EKGs have noted LAFB. This was present on EKG 09/06/2019. Prior EKGs have noted PACs. These were present on EKG 09/06/2019. Prior EKGs have noted incomplete RBBB, dating back to at least 02/26/2022. In addition, prior EKGs have noted LVH changes. When today's EKG was compared to prior EKGs, I see no  significant change.     Assessment/Plan  Assessment/Plan   1. Heart failure with preserved ejection fraction, unspecified HF chronicity (CMS HCC)    2. Hypertension, unspecified type    3. Hyperlipidemia, unspecified hyperlipidemia type    4. Statin intolerance    5. Vertebral artery disease (CMS HCC)    6. Incomplete RBBB    7. LAFB (left anterior fascicular block)    8. PAC  (premature atrial contraction)    9. LVH (left ventricular hypertrophy)      Orders Placed This Encounter    ECG 12 LEAD W/ INTERP - TODAY (MUSE , IN CLINIC)      1. HFpEF.  First diagnosed over 6-7 years ago at outside facility in [place], of which she had to receive what sounds like IV diuretic therapy. Echocardiogram 10/28/2023 noted preserved ejection fraction of 60% with mild MR and mild TR.  Patient had normal RVSP.    - She currently denies worsening dyspnea. No complaints of PND/PNO, fluid retention, or weight gain. She does not appear overtly fluid overloaded on exam. Lung fields clear to auscultation b/l. I do not appreciate any excessive LE swelling.   - Current GDMT with beta blockade/Toprol  XL 25 mg qDay.   - Previously on Arb therapy with irbesartan .  However, this was discontinued during hospitalization in November 2023 due to hypotension. Patient also with CKD.   - Recommend daily weights.   - She does have lasix  20 mg BID PRN to use.    2. Hypertension.  Intake BP 134/86.   - Continue Toprol  XL 25 mg qDay.   - Continue low at diet.    3. Hyperlipidemia; Statin intolerance.  Statin intolerance. She notes that she has tried multiple statins all which cause myalgias. She is unable to recall exact names. Documented pravastatin 08/2014.   - Currently on Zetia  10 mg qDay.    - She is with diabetes. Recommend LDL < 70, HDL > 45, and triglycerides < 150.   - Chart review notes that she has been offered alternative statin therapy, and has deferred in the past.   - Could consider PCSK9 therapy with Repatha or Praluent in the future if patient willing.   - Continue low fat diet.    4. Vertebral artery stenosis.  Patient did have carotid artery ultrasound performed 12/25/2021.  At that time she had less than 50 percent bilateral carotid artery disease.  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  CTA of the head and neck was ordered.  She notes that she did try to get this done  on 2 separate occasions, but her kidney function was too poor for them to do this study.  Patient underwent carotid artery ultrasound 10/28/2023.  This noted less than 50% right and left internal carotid artery stenosis and external carotid artery stenosis.  Antegrade flow noted bilateral vertebral arteries.     - ASA 81 mg qDay.   - Statin intolerance, currently on Zetia  10 mg qDay.    5. Incomplete RBBB; LAFB; PACs; LVH EKG changes.  EKG today noting SR at 77bpm with incomplete RBBB and LAFB and PACs. No acute ST/T wave changes. LVH with repolarization abnormality noted. Prior EKGs have noted LAFB. This was present on EKG 09/06/2019. Prior EKGs have noted PACs. These were present on EKG 09/06/2019. Prior EKGs have noted incomplete RBBB, dating back to at least 02/26/2022. In addition, prior EKGs have noted LVH changes. When today's EKG was compared  to prior EKGs, I see no significant change.    - She denies palpitations and syncope.   - Echo 10/2023 noted preserved EF with no severe valvular disease and no LVH.   - Recommend routine EKGs for monitor.   -  - Continue to avoidance of excessive caffeine consumption, illicit drug use, tobacco use, and stimulant-type product usage.     PLAN  Would continue to work on aggressive risk factor modification.  Patient was encouraged to call with any further questions/concerns.  Patient is to always seek immediate medical attention if feeling too poorly for any reason.  Will continue current cardiac treatment. No medication changes today.  Patient can follow-up with Cardiology in 12 months, or always sooner if needed.      Return in about 1 year (around 06/15/2025).       NOTE:  Please note that this Assessment/Plan is Cardiology/Heart & Vascular focused.  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      NOTE:  This patient was seen independently in clinic by the APP.     NOTE:  A portion of this documentation was generated using MMODAL voice recognition software and may contain syntax/voice recognition errors. Any errors are not intentional. Please call with any questions/concerns.    Lyle DOROTHA Galt, PA-C  [facility]

## 2024-06-15 NOTE — Patient Instructions (Signed)
 1.  Continue medications as prescribed.  2.  Follow-up with Cardiology in 12 months, or always sooner if needed.  3.  Please do not hesitate to call with any further questions/concerns.  4.  Always seek immediate medical attention if feeling too poorly for any reason.

## 2024-06-18 ENCOUNTER — Ambulatory Visit (HOSPITAL_BASED_OUTPATIENT_CLINIC_OR_DEPARTMENT_OTHER): Payer: Self-pay

## 2024-06-22 ENCOUNTER — Ambulatory Visit (HOSPITAL_COMMUNITY): Payer: Self-pay | Admitting: NURSE PRACTITIONER

## 2024-06-26 ENCOUNTER — Other Ambulatory Visit (INDEPENDENT_AMBULATORY_CARE_PROVIDER_SITE_OTHER): Payer: Self-pay | Admitting: Student in an Organized Health Care Education/Training Program

## 2024-06-26 DIAGNOSIS — M353 Polymyalgia rheumatica: Secondary | ICD-10-CM

## 2024-06-28 ENCOUNTER — Telehealth (HOSPITAL_BASED_OUTPATIENT_CLINIC_OR_DEPARTMENT_OTHER): Payer: Self-pay | Admitting: Gastroenterology

## 2024-06-28 NOTE — Telephone Encounter (Addendum)
 Pt's procedure is cancelled at this time.    Spoke to pt:  no    Procedure:  EUS  Original date/time:  06/30/2024   Provider:  Dr. Cammie  Reason:  Does not want it    Sx removed from original date/time:  yes  Sx sched aware:  Yes  Packet moved to cancelled folder:  Yes    Follow up appointment cancelled:  Yes    Comments:  I called and spoke with pt's son, who stated pt decided she does not want to have procedure done.     ----- Message from [facility] T sent at 06/28/2024  2:50 PM EDT -----  DR CAMMIE PT FROM [facility] WANTS TO CANCEL.      CASE IN DEPOT

## 2024-06-29 NOTE — Telephone Encounter (Signed)
 Joanna Black was seen on 03/02/2024 by Dr. Wriston for Polymyalgia rheumatica .  Labs were completed on 05/05/2024  and results are listed below.  The next scheduled appointment date is 05/28/2024.  [facility] is requesting a refill on Prednisone ..  Message has been routed to Dr. Froylan.      Lab Results   Component Value Date/Time    WBC 10.0 05/05/2024 10:16 AM    HGB 12.3 05/05/2024 10:16 AM    HCT 39.4 05/05/2024 10:16 AM    PLTCNT 219 05/05/2024 10:16 AM    RBC 4.07 05/05/2024 10:16 AM    MCV 96.8 05/05/2024 10:16 AM    MCHC 31.2 05/05/2024 10:16 AM    MCH 30.2 05/05/2024 10:16 AM    RDW 18.6 (H) 08/18/2023 09:16 AM    MPV 11.8 05/05/2024 10:16 AM      Lab Results   Component Value Date    CREATININE 1.25 (H) 05/05/2024      Hepatic Function  Lab Results   Component Value Date    ALBUMIN 3.3 (L) 05/05/2024    TOTALPROTEIN 6.5 05/05/2024    ALKPHOS 72 05/05/2024    PROTHROMTME 13.5 (H) 10/29/2022    INR 1.19 10/29/2022    AST 14 05/05/2024    ALT 9 05/05/2024    BILIRUBINCON 0.3 10/29/2022         Joanna Cedars, LPN  01/23/7973,  07:44

## 2024-06-30 ENCOUNTER — Ambulatory Visit (HOSPITAL_BASED_OUTPATIENT_CLINIC_OR_DEPARTMENT_OTHER): Payer: Self-pay | Admitting: Student in an Organized Health Care Education/Training Program

## 2024-07-01 ENCOUNTER — Other Ambulatory Visit (INDEPENDENT_AMBULATORY_CARE_PROVIDER_SITE_OTHER): Payer: Self-pay | Admitting: Family Medicine

## 2024-07-01 ENCOUNTER — Ambulatory Visit (INDEPENDENT_AMBULATORY_CARE_PROVIDER_SITE_OTHER): Payer: Self-pay | Admitting: Neurological Surgery

## 2024-07-01 DIAGNOSIS — I152 Hypertension secondary to endocrine disorders: Secondary | ICD-10-CM

## 2024-07-05 ENCOUNTER — Other Ambulatory Visit (INDEPENDENT_AMBULATORY_CARE_PROVIDER_SITE_OTHER): Payer: Self-pay | Admitting: Family Medicine

## 2024-07-06 NOTE — Progress Notes (Signed)
 PAIN MANAGEMENT, [facility] OFFICE BUILDING  [address]  [place] [address]  Operated by [facility]    Name: Joanna Black MRN:  [id number]   Date: 07/12/2024 DOB:  03/10/1946 (78 y.o.)         CC: Follow Up After Injection     Obtained history from: patient    SUBJECTIVE:  Joanna Black is a 78 y.o. female  who RETURNS to Pain Management for follow up evaluation.  Last seen 06/08/24 for L4-5 ESI per Dr. Court.  She reports 95% relief which is ongoing.  Overall, is very pleased with results of injection and notes improvement in daily activities.  States this is her best relief with procedures so far.  Currently rates pain 2/10 and is tolerable.  Denies interval changes in health and medications.  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  Also follows with Dr. 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  As previously documented, patient has participated in conservative treatment in the past such as physical therapy and medication management without adequate relief.    Procedures:  09/23/23 Caudal ESI with catheter- No relief  02/24/24 Right L4-5, L5-S1 RFA- 70% relief ongoing  03/23/24 Left L4-5, L5-S1 RFA- No relief  06/08/24 L4-5 ESI- 95% relief ongoing    Patient's past history and current information (including medical, surgical, family, social, allergy, medication) were reviewed personally today.     REVIEW OF SYSTEMS:  Other than ROS in the HPI, all other review of systems were negative except: bladder leakage (ongoing), intermittent SOB, and DM.  Denies new numbness/weakness, bowel incontinence, saddle anesthesia.    PHYSICAL EXAMINATION:    Vitals: Pulse 56   Temp 36.3 C (97.3 F)   Ht 1.626 m (5' 4)   Wt 95.8 kg (211 lb 3.2 oz)   SpO2 95%   BMI 36.25 kg/m .  Declines BP check. States cuff hurts her arm.      GENERAL EXAMINATION:  resting in seated position. No acute distress.   SKIN:  warm and dry  EYES:  Conjunctiva  clear  HEAD/EARS/NOSE/THROAT:   normocephalic, atraumatic  NECK:   symmetric, trachea midline  CHEST WALL AND LUNGS:   Unlabored breathing, symmetrical chest excursions.  PSYCHOLOGICAL:  Affect normal. Behavior appropriate.  NEUROLOGICAL/MUSCULOSKELETAL:  Alert and oriented x3,  memory grossly intact  Visual Inspection: No appreciable muscular atrophy  Motor is 5/5 across all joints of the BLE  Sensation: normal  Coordination: Normal  Gait:Nonantalgic with use of no assistive device      RADIOLOGICAL/ LAB FINDINGS:    Lab A1C Results:  HEMOGLOBIN A1C   Date Value Ref Range Status   08/18/2023 7.1 (H) 4.8 - 6.2 % Final   01/23/2023 6.4 (H) 4.8 - 6.2 % Final   12/10/2022 7.6  Final   03/13/2022 5.9  Final   11/29/2021 6.3 (H) 4.8 - 6.2 % Final       POCT A1C Results:      03/13/2022    11:00 AM 10/03/2022     4:00 PM 12/10/2022     9:00 AM 05/25/2024    11:00 AM   POCT A1c   Time Performed   09:27 11:40   A1C 5.9 6.3 7.6 7.2         MRI SPINE LUMBOSACRAL WO CONTRAST     Collection Time: 05/21/23  1:53 PM     Narrative     SHARYNE JONELLE AGENT  PROCEDURE DESCRIPTION: MRI SPINE LUMBOSACRAL WO CONTRAST     CLINICAL INDICATION: R26.2: Ambulatory dysfunction     COMPARISON: No prior studies were compared.        FINDINGS: Multiplanar multisequence images lumbar spine obtained without IV contrast enhancement. There is grade 1 anterolisthesis L4 on L5. There is no bone edema. There is disc desiccation at multiple levels throughout the lumbar spine. Mild disc space narrowing is seen. Spinal cord is normal with conus terminating at L1.     There is mild disc bulging L1-2 but no significant spinal stenosis.     Mild disc bulging and degenerative facet arthropathy L2-3 without significant spinal stenosis.     Degenerative facet changes and mild disc bulging L3-4 with mild central canal stenosis and foraminal narrowing.     Anterolisthesis L4 on L5 with disc bulging, disc uncovering, and facet arthropathy causes moderate to severe  central canal stenosis and severe bilateral foraminal narrowing.     Degenerative facet changes L5-S1 with disc bulging more prominent towards the left side. There is a central disc protrusion or herniation superimposed. There is moderate left foraminal narrowing. There is mild central canal stenosis.     Paraspinal soft tissues appear normal.     No findings to suggest infectious or inflammatory process of the lumbar spine.        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  She is advised to call with any questions or concerns.  Also advised to call if pain returns/worsens for further injection therapy.    Follows with:       Rheumatology, Dr. Froylan, Hx; polymyalgia rheumatica and giant cell arteritis     Dr. 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    Blood thinner: None  A1c per Dr. Tracie, PCP       Consider:  Repeat L4-5 (leftward) ESI at [facility].   Left L4-5, L5-S1 TFESIs  Left SIJ injection    Our impression, treatment recommendations and plan from today's visit were reviewed in detail with the patient in the office. All of the patient's questions were answered.   The patient verbalized understanding and wishes to move forward with the above noted plan.  Follow up:  Return if symptoms worsen or fail to improve.    Patient was seen independently.    Ronal Buck, APRN,FNP-BC

## 2024-07-12 ENCOUNTER — Other Ambulatory Visit: Payer: Self-pay

## 2024-07-12 ENCOUNTER — Encounter (INDEPENDENT_AMBULATORY_CARE_PROVIDER_SITE_OTHER): Payer: Self-pay | Admitting: Neurological Surgery

## 2024-07-12 ENCOUNTER — Ambulatory Visit: Payer: Self-pay | Attending: Family Medicine | Admitting: Neurological Surgery

## 2024-07-12 VITALS — HR 56 | Temp 97.3°F | Ht 64.0 in | Wt 211.2 lb

## 2024-07-12 DIAGNOSIS — M4316 Spondylolisthesis, lumbar region: Secondary | ICD-10-CM | POA: Insufficient documentation

## 2024-07-12 DIAGNOSIS — M5416 Radiculopathy, lumbar region: Secondary | ICD-10-CM | POA: Insufficient documentation

## 2024-07-12 DIAGNOSIS — M47816 Spondylosis without myelopathy or radiculopathy, lumbar region: Secondary | ICD-10-CM | POA: Insufficient documentation

## 2024-07-12 DIAGNOSIS — M4726 Other spondylosis with radiculopathy, lumbar region: Secondary | ICD-10-CM

## 2024-07-12 DIAGNOSIS — M48061 Spinal stenosis, lumbar region without neurogenic claudication: Secondary | ICD-10-CM | POA: Insufficient documentation

## 2024-07-12 NOTE — Nursing Note (Signed)
 Pain and Function:     [place] Pain Rating Scale     On a scale of 0-10, during the past 24 hours, pain has interfered with you usual activity:     0  On a scale of 0-10, during the past 24 hours, pain has interfered with your sleep:  0    On a scale of 0-10, during the past 24 hours, pain has affected your mood:   0    On a scale of 0-10, during the past 24 hours, pain has contributed to your stress:   0    On a scale of 0-10, what is your overall pain Rating:  0           Pain Better  Last seen on 06/08/2024 for procedure lesi    95 % of relief for now   Lowest level of pain 2 /10  Highest level of pain 3 /10  Powell Pool, MA

## 2024-07-16 ENCOUNTER — Ambulatory Visit (INDEPENDENT_AMBULATORY_CARE_PROVIDER_SITE_OTHER): Admitting: Student in an Organized Health Care Education/Training Program

## 2024-07-16 NOTE — Progress Notes (Unsigned)
 [facility]  [address]  [place], [place] [address]  P: [phone]  F: [phone]       NAME:  Joanna Black  DOB:  1946-03-10  AGE:  78 y.o.  MRN:  [id number]  APPT:  07/19/2024     No chief complaint on file.       Subjective:     This is a case of a 78 y.o. year old female who comes in today for ***    Past Medical History:   Diagnosis Date    Arthritis     Asthma     Back problem     BMI 35.0-35.9,adult 02/16/2020    CAD (coronary artery disease)     mild    Cataract     Cataracts, bilateral     CHF (congestive heart failure)     Chronic bronchitis with emphysema (CMS HCC)     Chronic low back pain     Claustrophobia     Depression     Diabetes     Diabetes mellitus, type 2     Encounter for support and coordination of transition of care 11/16/2022    Esophageal reflux     Essential hypertension 01/28/2019    H/O urinary tract infection     HH (hiatus hernia)     History of kidney disease     states, low kidney functions.    Hypercholesterolemia     Hypertension     Hypertriglyceridemia     Polymyalgia rheumatica (CMS HCC)     Type 2 diabetes mellitus          Past Surgical History:   Procedure Laterality Date    (10AM) CAUDAL EPIDURAL INJECTION  **DIABETIC** N/A 09/23/2023    Performed by Court Lot, DO at [facility] OR MAIN    (1145AM) #1 BILATERAL L4/5, L5/S1 LUMBAR MEDIAL BRANCH BLOCK **DIABETIC** **GIVE 1MG  ATIVAN ON ARRIVAL** Bilateral 01/27/2024    Performed by Court Lot, DO at [facility] OR MAIN    (1145AM) #1 BILATERAL L4/5, L5/S1 LUMBAR MEDIAL BRANCH BLOCK **DIABETIC** **GIVE 1MG  ATIVAN ON ARRIVAL** Bilateral 01/27/2024    Performed by Court Lot, DO at [facility] OR MAIN    (1145AM) #2 BILATERAL L4/5, L5/S1 LUMBAR MEDIAL BRANCH BLOCK **DIABETIC** **GIVE 1MG  ATIVAN ON ARRIVAL** Bilateral 02/10/2024    Performed by Court Lot, DO at [facility] OR MAIN    (1145AM) LEFT L4/5 LUMBAR EPIDURAL STEROID INJECTION UPDATED A1C 7.2(05/25/24) **DIABETIC** Left 06/08/2024    Performed by Court Lot, DO at [facility]  OR MAIN    (1145AM) LEFT L4/5, L5/S1 LUMBAR MEDIAL BRANCH NERVE RADILFREQUENCY ABLATION **DIABETIC** **GIVE 1MG  ATIVAN ON ARRIVAL** Left 03/23/2024    Performed by Court Lot, DO at [facility] OR MAIN    (1145AM) LEFT L4/5, L5/S1 LUMBAR MEDIAL BRANCH NERVE RADILFREQUENCY ABLATION **DIABETIC** **GIVE 1MG  ATIVAN ON ARRIVAL** Left 03/23/2024    Performed by Court Lot, DO at [facility] OR MAIN    (1145AM) RIGHT L4/5, L5/S1 LUMBAR MEDIAL BRANCH NERVE RADILFREQUENCY ABLATION **DIABETIC** **GIVE 1MG  ATIVAN ON ARRIVAL** Right 02/24/2024    Performed by Court Lot, DO at [facility] OR MAIN    (1145AM) RIGHT L4/5, L5/S1 LUMBAR MEDIAL BRANCH NERVE RADILFREQUENCY ABLATION **DIABETIC** **GIVE 1MG  ATIVAN ON ARRIVAL** Right 02/24/2024    Performed by Court Lot, DO at [facility] OR MAIN    CATARACT EXTRACTION, BILATERAL Bilateral     bilateral lens implant    COLONOSCOPY      CYSTOSCOPY N/A 05/26/2023  Performed by Fausto Alm FALCON, MD at [facility] OR MAIN    ENDOSCOPIC U/S UPPER with FNA N/A 04/15/2023    Performed by Cammie Iha, DO at [facility] OR ENDO    ENDOSCOPIC U/S UPPER with FNA N/A 05/10/2022    Performed by Cammie Iha, DO at [facility] OR ENDO    ENDOSCOPIC ULTRASOUND ESOPHGEAL  04/15/2023    ESOPHAGOGASTRODUODENOSCOPY      GASTROSCOPY  05/10/2022    Performed by Cammie Iha, DO at [facility] OR ENDO    GASTROSCOPY WITH DILATION Left 06/02/2024    Performed by Reinaldo Exon, MD at [facility] OR ENDO    GASTROSCOPY WITH DILATION Left 09/24/2022    Performed by Reinaldo Exon, MD at [facility] OR ENDO    GASTROSCOPY WITH DILATION Left 06/05/2022    Performed by Reinaldo Exon, MD at [facility] OR ENDO    GASTROSCOPY WITH DILATION and bxs Left 05/07/2022    Performed by Reinaldo Exon, MD at [facility] OR ENDO    HX CARPAL TUNNEL RELEASE      HX CATARACT REMOVAL Right 12/30/2019    HX CATARACT REMOVAL Left 01/06/2020    HX CHOLECYSTECTOMY      HX EXPOSURE TO METAL SHAVINGS      HX HEART CATHETERIZATION      HX HYSTERECTOMY      HX OOPHORECTOMY      HX TAH AND BSO       HX TUBAL LIGATION      HX VEIN STRIPPING      Left leg    PHACO WITH INTRAOCULAR LENS Left 01/06/2020    Performed by Georgina Dunnings, MD at [facility] OR MAIN    PHACO WITH INTRAOCULAR LENS Right 12/30/2019    Performed by Georgina Dunnings, MD at [facility] OR MAIN     Family Medical History:       Problem Relation (Age of Onset)    Bone cancer Father    Diabetes Multiple family members    Hypertension (High Blood Pressure) Multiple family members    No Known Problems Mother    Stomach Cancer Paternal Grandmother            Social History     Socioeconomic History    Marital status: Married     Spouse name: [name]    Number of children: 3    Years of education: 13    Highest education level: High school graduate   Occupational History    Occupation: Retired   Tobacco Use    Smoking status: Never     Passive exposure: Never    Smokeless tobacco: Never   Vaping Use    Vaping status: Never Used   Substance and Sexual Activity    Alcohol use: No     Alcohol/week: 0.0 standard drinks of alcohol    Drug use: No    Sexual activity: Not Currently     Partners: Male   Other Topics Concern    Right hand dominant Yes    Ability to Walk 1 Flight of Steps without SOB/CP No    Ability To Do Own ADL's Yes   Social History Narrative    MARRIED.  Lives in single story home.  Heats home with electric and has well water .        April Haney, LPN  7/75/7978, 14:01     Social Determinants of Health     Financial Resource Strain: Low Risk  (04/13/2024)    Financial Resource Strain     SDOH  Financial: No   Transportation Needs: Low Risk  (04/13/2024)    Transportation Needs     SDOH Transportation: No   Social Connections: Low Risk  (04/13/2024)    Social Connections     SDOH Social Isolation: 5 or more times a week   Intimate Partner Violence: Low Risk  (04/13/2024)    Intimate Partner Violence     SDOH Domestic Violence: No   Housing Stability: Low Risk  (04/13/2024)    Housing Stability     SDOH Housing Situation: I have housing.     SDOH Housing Worry:  No     Current Outpatient Medications   Medication Sig    acetaminophen  (TYLENOL ) 500 mg Oral Tablet Take 1 Tablet (500 mg total) by mouth Every night as needed Takes 2 at night    albuterol  sulfate (PROVENTIL  OR VENTOLIN  OR PROAIR ) 90 mcg/actuation Inhalation oral inhaler Take 1-2 Puffs by inhalation Every 6 hours as needed    albuterol  sulfate (PROVENTIL ) 2.5 mg /3 mL (0.083 %) Inhalation nebulizer solution Take 3 mL (2.5 mg total) by nebulization Three times a day as needed for Wheezing    calcium carbonate/vitamin D3 (VITAMIN D -3 ORAL) Take 2,000 Int'l Units/day by mouth Daily    ezetimibe  (ZETIA ) 10 mg Oral Tablet Take 1 Tablet (10 mg total) by mouth Every evening    FLUoxetine  (PROZAC ) 40 mg Oral Capsule Take 1 Capsule (40 mg total) by mouth Daily    furosemide  (LASIX ) 20 mg Oral Tablet TAKE 1 TABLET (20 MG TOTAL) BY MOUTH TWICE PER DAY AS NEEDED    magnesium  oxide (MAG-OX) 400 mg Oral Tablet Take 1 Tablet (400 mg total) by mouth Three times a day (Patient not taking: Reported on 07/12/2024)    metoprolol  succinate (TOPROL -XL) 25 mg Oral Tablet Sustained Release 24 hr TAKE 1 TABLET BY MOUTH EVERY NIGHT.    omeprazole  (PRILOSEC) 40 mg Oral Capsule, Delayed Release(E.C.) Take 1 Capsule (40 mg total) by mouth Daily Indications: gastroesophageal reflux disease    predniSONE  (DELTASONE ) 1 mg Oral Tablet Start taking 4 mg daily after the 5 mg prescription has been finished.  Reduce daily prednisone  dose by 1 mg every 3 weeks. (Patient not taking: Reported on 07/12/2024)    predniSONE  (DELTASONE ) 5 mg Oral Tablet Take 1 Tablet (5 mg total) by mouth Daily    tiZANidine  (ZANAFLEX ) 4 mg Oral Tablet TAKE 1 TABLET BY MOUTH THREE TIMES A DAY AS NEEDED FOR MUSCLE CRAMPS    TRULICITY  0.75 mg/0.5 mL Subcutaneous Pen Injector INJECT THE CONTENTS OF ONE PEN  SUBCUTANEOUSLY WEEKLY AS  DIRECTED     Review of Systems  Objective:   There were no vitals taken for this visit.      Physical Exam    PHQ Questionnaire        Assessment/Plan   There are no diagnoses linked to this encounter.  {bmigant:32679}  No orders of the defined types were placed in this encounter.            {This patient MAY be non-adherent to their non-insulin  DIABETES medication. (If medication adherence given choose from SmartList. This will disappear if unselected upon signing note):[id number]}      Health Maintenance Due   Topic Date Due    Adult Tdap-Td (1 - Tdap) Never done    RSV Adult 60+ or Pregnancy (1 - 1-dose 75+ series) Never done    Diabetic Retinal Exam  10/25/2020    Osteoporosis screening  12/05/2022  Covid-19 Vaccine (5 - 2024-25 season) 08/24/2023       Controlled Substances Tracking      Follow up: No follow-ups on file.    The patient was given ample opportunity to ask questions and those questions were answered to the patient's satisfaction. The patient was encouraged to be involved in their own care, and all diagnoses, medications, and medication side-effects were discussed. The patient was told to contact me with any additional questions or concerns, or go to the ED in the case of an emergency.       Akiba Melfi, DO

## 2024-07-19 ENCOUNTER — Other Ambulatory Visit: Payer: Self-pay

## 2024-07-19 ENCOUNTER — Encounter (INDEPENDENT_AMBULATORY_CARE_PROVIDER_SITE_OTHER): Payer: Self-pay | Admitting: Family Medicine

## 2024-07-19 ENCOUNTER — Ambulatory Visit: Payer: Self-pay | Attending: Family Medicine | Admitting: Family Medicine

## 2024-07-19 VITALS — BP 116/74 | HR 74 | Temp 96.8°F | Resp 18 | Ht 64.02 in | Wt 205.0 lb

## 2024-07-19 DIAGNOSIS — N183 Chronic kidney disease, stage 3 unspecified: Secondary | ICD-10-CM

## 2024-07-19 DIAGNOSIS — I152 Hypertension secondary to endocrine disorders: Secondary | ICD-10-CM

## 2024-07-19 DIAGNOSIS — E1169 Type 2 diabetes mellitus with other specified complication: Secondary | ICD-10-CM

## 2024-07-19 DIAGNOSIS — E785 Hyperlipidemia, unspecified: Secondary | ICD-10-CM

## 2024-07-19 DIAGNOSIS — E669 Obesity, unspecified: Secondary | ICD-10-CM | POA: Insufficient documentation

## 2024-07-19 DIAGNOSIS — Z6835 Body mass index (BMI) 35.0-35.9, adult: Secondary | ICD-10-CM | POA: Insufficient documentation

## 2024-07-19 DIAGNOSIS — K580 Irritable bowel syndrome with diarrhea: Secondary | ICD-10-CM | POA: Insufficient documentation

## 2024-07-19 DIAGNOSIS — Z79899 Other long term (current) drug therapy: Secondary | ICD-10-CM | POA: Insufficient documentation

## 2024-07-19 DIAGNOSIS — F419 Anxiety disorder, unspecified: Secondary | ICD-10-CM | POA: Insufficient documentation

## 2024-07-19 MED ORDER — FLUOXETINE 40 MG CAPSULE
40.0000 mg | ORAL_CAPSULE | Freq: Every day | ORAL | 1 refills | Status: AC
Start: 2024-07-19 — End: ?

## 2024-07-19 MED ORDER — COLESTIPOL 1 GRAM TABLET
1.0000 g | ORAL_TABLET | Freq: Two times a day (BID) | ORAL | 0 refills | Status: DC
Start: 2024-07-19 — End: 2024-07-29

## 2024-07-22 ENCOUNTER — Ambulatory Visit (HOSPITAL_COMMUNITY): Payer: Self-pay | Admitting: Internal Medicine

## 2024-07-27 ENCOUNTER — Emergency Department
Admission: EM | Admit: 2024-07-27 | Discharge: 2024-07-27 | Disposition: A | Discharge status: Home / Self Care | Attending: Emergency Medicine | Admitting: Emergency Medicine

## 2024-07-27 ENCOUNTER — Encounter (HOSPITAL_COMMUNITY): Payer: Self-pay

## 2024-07-27 ENCOUNTER — Other Ambulatory Visit: Payer: Self-pay

## 2024-07-27 ENCOUNTER — Emergency Department (HOSPITAL_COMMUNITY)

## 2024-07-27 DIAGNOSIS — W010XXA Fall on same level from slipping, tripping and stumbling without subsequent striking against object, initial encounter: Secondary | ICD-10-CM | POA: Insufficient documentation

## 2024-07-27 DIAGNOSIS — S20211A Contusion of right front wall of thorax, initial encounter: Secondary | ICD-10-CM | POA: Insufficient documentation

## 2024-07-27 MED ORDER — HYDROCODONE 5 MG-ACETAMINOPHEN 325 MG TABLET: 1.0000 | ORAL_TABLET | Freq: Four times a day (QID) | ORAL | 0 refills | Status: DC | PRN

## 2024-07-27 MED ORDER — HYDROCODONE 5 MG-ACETAMINOPHEN 325 MG TABLET
1.0000 | ORAL_TABLET | ORAL | Status: AC
Start: 2024-07-27 — End: 2024-07-27
  Administered 2024-07-27: 1 via ORAL
  Filled 2024-07-27: qty 1

## 2024-07-27 MED ORDER — IBUPROFEN 600 MG TABLET
600.0000 mg | ORAL_TABLET | Freq: Four times a day (QID) | ORAL | 0 refills | Status: DC | PRN
Start: 2024-07-27 — End: 2024-10-09

## 2024-07-27 MED ORDER — KETOROLAC 30 MG/ML (1 ML) INJECTION SOLUTION
30.0000 mg | INTRAMUSCULAR | Status: AC
Start: 2024-07-27 — End: 2024-07-27
  Administered 2024-07-27: 30 mg via INTRAMUSCULAR
  Filled 2024-07-27: qty 1

## 2024-07-27 NOTE — ED Triage Notes (Addendum)
 Patient states that she fell at home on Friday night.  Saturday night she started having rib pain.  Patient states she has bruising.  Patient states she is more short of breath than usual.  Bruising right hip

## 2024-07-27 NOTE — ED Provider Notes (Signed)
 Department of Emergency Medicine  [facility]  07/27/2024        Patient name: Joanna Black  Patient DOB: [birth year]        Patient is a 78 y.o.  female presenting to the ED with chief complaint of right rib pain.  Patient tripped and had a fall 2 days ago injuring her right side.  Subsequently yesterday she developed pain in her right ribs that is worse with movement worse with deep inspiration.  Has some bruising and pain in her right hip area as well.      Review of Systems:  All other symptoms reviewed and are negative, unless commented on in the HPI.     Past Medical History:  Past Medical History:  Past Medical History:   Diagnosis Date    Arthritis     Asthma     Back problem     BMI 35.0-35.9,adult 02/16/2020    CAD (coronary artery disease)     mild    Cataract     Cataracts, bilateral     CHF (congestive heart failure)     Chronic bronchitis with emphysema (CMS HCC)     Chronic low back pain     Claustrophobia     Depression     Diabetes     Diabetes mellitus, type 2     Encounter for support and coordination of transition of care 11/16/2022    Esophageal reflux     Essential hypertension 01/28/2019    H/O urinary tract infection     HH (hiatus hernia)     History of kidney disease     states, low kidney functions.    Hypercholesterolemia     Hypertension     Hypertriglyceridemia     Polymyalgia rheumatica (CMS HCC)     Type 2 diabetes mellitus        Past Surgical History:  Past Surgical History:   Procedure Laterality Date    Cataract extraction, bilateral Bilateral     Colonoscopy      Endoscopic ultrasound esophgeal  04/15/2023    Esophagogastroduodenoscopy      Hx carpal tunnel release      Hx cataract removal Right 12/30/2019    Hx cataract removal Left 01/06/2020    Hx cholecystectomy      Hx exposure to metal shavings      Hx heart catheterization      Hx hysterectomy      Hx oophorectomy      Hx tah and bso      Hx tubal ligation      Hx vein stripping         Social  History:  Social History[1]  Social History     Substance and Sexual Activity   Drug Use No       Family History:  Family History   Problem Relation Age of Onset    No Known Problems Mother     Bone cancer Father     Diabetes Multiple family members     Hypertension (High Blood Pressure) Multiple family members     Stomach Cancer Paternal Grandmother          Above history reviewed.  Allergies, medication list, and old records also reviewed.     Physical Exam:  ED Triage Vitals [07/27/24 0515]   BP (Non-Invasive) (!) 164/81   Heart Rate 89   Resp    Temperature 36.1 C (97 F)   SpO2 97 %  Weight 93 kg (205 lb)   Height 1.626 m (5' 4)       Nursing note and vitals reviewed.  Vital signs reviewed as above.  No acute distress.   Constitutional: Patient is well-developed and well-nourished.   Head: Normocephalic and atraumatic.  Eyes: Conjunctivae are normal. Pupils are equal, round, and reactive to light.   Neck: Soft, supple, full range of motion.  C-spine midline nontender no step-off deformities.  Pulmonary/Chest: No respiratory distress, patient has obvious discomfort with deep inspiration.  Lungs sounds are clear bilateral no rhonchi rales appreciated.  Patient has some mild to moderate discomfort with palpation along the midportion of the anterior axillary line on the right side.  There is no palpable crepitus no obvious flail segment.  No bruising overlying the chest wall.  She has small area of bruising along the belt line on the right side.  Abdominal:  Soft ,non tender, non distended, no guarding, no palpable massess  Musculoskeletal: Normal range of motion. No deformities.  Bruising on the right hip area.  Mild pain with palpation over the bruised area.  Denies any pain with range of motion of the right hip.  Neurological: CNs 2-12 grossly intact.  No focal deficits noted.  Skin: No rash or lesions  Psychiatric: Patient has a normal mood and affect.   GCS: 15     Workup:     Labs:  No results found for  this or any previous visit (from the past 24 hours).    Imaging:    Results for orders placed or performed during the hospital encounter of 07/27/24 (from the past 72 hours)   XR RIBS RIGHT W PA/AP CHEST     Status: None    Narrative    [name]    PROCEDURE DESCRIPTION: XR RIBS RIGHT W PA/AP CHEST    CLINICAL INDICATION: Rib fracture    TECHNIQUE: 5 views / 5 images submitted.    COMPARISON: Chest x-ray from March 02, 2024      FINDINGS:   Lungs:No acute airspace or interstitial opacity seen.  Pleura: No effusion or pneumothorax.  Cardiomediastinal:Normal size.  Bones: No acute fracture. Specifically no right-sided rib fractures are seen.  Other: None.      Impression    Impression:  No right-sided rib fractures or other acute radiographic abnormality seen.         Radiologist location ID: [id number]     XR HIP RIGHT W PELVIS 2-3 VIEWS     Status: None    Narrative    PROCEDURE DESCRIPTION: XR HIP RIGHT W PELVIS 2-3 VIEWS    CLINICAL INDICATION: Pain after fall    TECHNIQUE: 3 views / 3 images submitted.    COMPARISON: No prior studies were compared.      FINDINGS:   Bones: No fracture or malalignment. Hip joint spaces relatively well-preserved.  Soft tissues: Unremarkable as imaged.  Other: None.      Impression    No fracture, malaalignment or other acute radiographic abnormality seen.           Radiologist location ID: [id number]         Orders Placed This Encounter    XR RIBS RIGHT W PA/AP CHEST    XR HIP RIGHT W PELVIS 2-3 VIEWS    HYDROcodone -acetaminophen  (NORCO) 5-325 mg per tablet    ketorolac  (TORADOL ) 30 mg/mL injection    HYDROcodone -acetaminophen  (NORCO) 5-325 mg Oral Tablet    Ibuprofen  (MOTRIN ) 600 mg Oral Tablet  Procedures    MDM:   During the patient's stay in the emergency department, the above listed imaging and/or labs were performed to assist with medical decision making and were reviewed by myself when available for review.     Differential diagnosis would include but not  limited to right-sided rib fractures versus rib contusions, pneumothorax.      X-rays not show any evidence of pneumothorax.  There was no displaced rib fractures.    Suspect clinically she there has some nondisplaced minor rib fractures not seen on x-ray or more likely she has some rib contusions.  No concerns for pulmonary contusion based off of x-ray findings.  Patient has had greater than 48 hours since the fall she has not had any respiratory distress.  Oxygen  saturations are 97% on room air.  I think it is reasonable to give her a brief course of Norco as she is obviously uncomfortable with deep inspiration.  Also treat with prescription dose NSAIDs such as 600 mg ibuprofen  every 6 hours.  We will have her follow up with her primary care provider in the next 1-2 days.  Discussed the x-ray findings and plan with the patient she is happy with the plan.     Patient remained stable throughout the emergency department course.      Impression:   Clinical Impression   Rib contusion, right, initial encounter (Primary)                 Disposition:Data Unavailable              Cheryl CROME Syrina Wake, DO  07/27/2024, 05:40           [1]   Social History  Tobacco Use    Smoking status: Never     Passive exposure: Never    Smokeless tobacco: Never   Vaping Use    Vaping status: Never Used   Substance Use Topics    Alcohol use: No     Alcohol/week: 0.0 standard drinks of alcohol    Drug use: No

## 2024-07-27 NOTE — Discharge Instructions (Signed)
 1. Activity low level   2. Take prescription Norco and prescription ibuprofen  as prescribed as directed on label  3. Return to the emergency department if signs or symptoms worsen such as but not limited to difficulty breathing, coughing up of blood-tinged sputum, development of fever greater than 100.4 F, alteration in mental status.

## 2024-07-27 NOTE — ED Nurses Note (Signed)
 Patient discharge to home.   Discharge instructions reviewed.  Prescriptions reviewed.  Patient stated understanding of discharge instructions.  Encouraged patient to take deep breaths to prevent pneumonia.  Explained to patient not to take tylenol  when taking the hydrocodone .  Explained to patient that the hydrocodone  contains tylenol .  Patient stated pain has decreased to a six.  Patient's blood pressure is elevated.  Patient has not taken her home medications this morning for her blood pressure.  Instructed patient to take her blood pressure medications when she arrives home.  Patient assisted to wheelchair and taken to POV.  Patient's husband is her driver.

## 2024-07-28 ENCOUNTER — Encounter (INDEPENDENT_AMBULATORY_CARE_PROVIDER_SITE_OTHER): Payer: Self-pay | Admitting: Family Medicine

## 2024-07-28 ENCOUNTER — Telehealth (HOSPITAL_COMMUNITY): Payer: Self-pay | Admitting: Family Medicine

## 2024-07-28 ENCOUNTER — Ambulatory Visit (INDEPENDENT_AMBULATORY_CARE_PROVIDER_SITE_OTHER): Admitting: Student in an Organized Health Care Education/Training Program

## 2024-07-28 NOTE — Telephone Encounter (Signed)
 Post Ed Follow-Up    Post ED Follow-Up:   Document completed and/or attempted interactive contact(s) after transition to home after emergency department stay.:   Transition Facility and relevant Date:   Discharge Date: 07/27/24  Discharge from [facility] Emergency Department?: Yes  Discharge Facility: [facility]  Contacted by: Nichole Lulas RN  Contact method: Patient/Caregiver Telephone  Contact completed: 07/28/2024 12:05 PM  MyChart message sent?: No  Was the AVS reviewed with patient?: Yes  Did the patient attempt to reach their PCP prior to going to the ED?: No  How is the patient recovering?: No better or worse  Medications prescribed: Yes  Were they obtained?: No  Barriers to obtaining meds:  (Comment: has same med at home)  Interventions: No needs identified  Follow Up Visit: Appointment already scheduled (Comment: tomorrow - 07/28/2024)  No questions/concerns. No improvement in pain but no worsening symptoms. Following up as above.

## 2024-07-28 NOTE — Progress Notes (Signed)
 [facility]  [address]  [place], [place] [address]  P: [phone]  F: [phone]       NAME:  Joanna Black  DOB:  [birth year]  AGE:  78 y.o.  MRN:  [id number]  APPT:  07/29/2024     Chief Complaint   Patient presents with    Emergency Room Follow Up     Fall         Subjective:     This is a case of a 78 y.o. year old female who comes in today for follow-up after she was evaluated in the emergency department on 07/27/2024 after she experienced a fall causing a right rib contusion.  Patient was found to be without fracture of right ribs.  Patient states she is doing a lot better.  She is without acute issues or concerns today. Patient denies recent symptoms of fatigue, fever, chills, night sweats, chest pain, irregular heart beats, shortness of breath, abdominal pain, or change in bowel/bladder function.       Past Medical History:   Diagnosis Date    Arthritis     Asthma     Back problem     BMI 35.0-35.9,adult 02/16/2020    CAD (coronary artery disease)     mild    Cataract     Cataracts, bilateral     CHF (congestive heart failure)     Chronic bronchitis with emphysema (CMS HCC)     Chronic low back pain     Claustrophobia     Depression     Diabetes     Diabetes mellitus, type 2     Encounter for support and coordination of transition of care 11/16/2022    Esophageal reflux     Essential hypertension 01/28/2019    H/O urinary tract infection     HH (hiatus hernia)     History of kidney disease     states, low kidney functions.    Hypercholesterolemia     Hypertension     Hypertriglyceridemia     Polymyalgia rheumatica (CMS HCC)     Type 2 diabetes mellitus          Past Surgical History:   Procedure Laterality Date    (10AM) CAUDAL EPIDURAL INJECTION  **DIABETIC** N/A 09/23/2023    Performed by Court Lot, DO at [facility] OR MAIN    (1145AM) #1 BILATERAL L4/5, L5/S1 LUMBAR MEDIAL BRANCH BLOCK **DIABETIC** **GIVE 1MG  ATIVAN ON ARRIVAL** Bilateral 01/27/2024    Performed by Court Lot, DO at  [facility] OR MAIN    (1145AM) #1 BILATERAL L4/5, L5/S1 LUMBAR MEDIAL BRANCH BLOCK **DIABETIC** **GIVE 1MG  ATIVAN ON ARRIVAL** Bilateral 01/27/2024    Performed by Court Lot, DO at [facility] OR MAIN    (1145AM) #2 BILATERAL L4/5, L5/S1 LUMBAR MEDIAL BRANCH BLOCK **DIABETIC** **GIVE 1MG  ATIVAN ON ARRIVAL** Bilateral 02/10/2024    Performed by Court Lot, DO at [facility] OR MAIN    (1145AM) LEFT L4/5 LUMBAR EPIDURAL STEROID INJECTION UPDATED A1C 7.2(05/25/24) **DIABETIC** Left 06/08/2024    Performed by Court Lot, DO at [facility] OR MAIN    (1145AM) LEFT L4/5, L5/S1 LUMBAR MEDIAL BRANCH NERVE RADILFREQUENCY ABLATION **DIABETIC** **GIVE 1MG  ATIVAN ON ARRIVAL** Left 03/23/2024    Performed by Court Lot, DO at [facility] OR MAIN    (1145AM) LEFT L4/5, L5/S1 LUMBAR MEDIAL BRANCH NERVE RADILFREQUENCY ABLATION **DIABETIC** **GIVE 1MG  ATIVAN ON ARRIVAL** Left 03/23/2024    Performed by Court Lot, DO at [facility] OR MAIN    (  1145AM) RIGHT L4/5, L5/S1 LUMBAR MEDIAL BRANCH NERVE RADILFREQUENCY ABLATION **DIABETIC** **GIVE 1MG  ATIVAN ON ARRIVAL** Right 02/24/2024    Performed by Court Lot, DO at [facility] OR MAIN    (1145AM) RIGHT L4/5, L5/S1 LUMBAR MEDIAL BRANCH NERVE RADILFREQUENCY ABLATION **DIABETIC** **GIVE 1MG  ATIVAN ON ARRIVAL** Right 02/24/2024    Performed by Court Lot, DO at [facility] OR MAIN    CATARACT EXTRACTION, BILATERAL Bilateral     bilateral lens implant    COLONOSCOPY      CYSTOSCOPY N/A 05/26/2023    Performed by Fausto Alm FALCON, MD at [facility] OR MAIN    ENDOSCOPIC U/S UPPER with FNA N/A 04/15/2023    Performed by Cammie Iha, DO at [facility] OR ENDO    ENDOSCOPIC U/S UPPER with FNA N/A 05/10/2022    Performed by Cammie Iha, DO at [facility] OR ENDO    ENDOSCOPIC ULTRASOUND ESOPHGEAL  04/15/2023    ESOPHAGOGASTRODUODENOSCOPY      GASTROSCOPY  05/10/2022    Performed by Cammie Iha, DO at [facility] OR ENDO    GASTROSCOPY WITH DILATION Left 06/02/2024    Performed by Reinaldo Exon, MD at [facility] OR ENDO    GASTROSCOPY WITH DILATION Left 09/24/2022     Performed by Reinaldo Exon, MD at [facility] OR ENDO    GASTROSCOPY WITH DILATION Left 06/05/2022    Performed by Reinaldo Exon, MD at [facility] OR ENDO    GASTROSCOPY WITH DILATION and bxs Left 05/07/2022    Performed by Reinaldo Exon, MD at [facility] OR ENDO    HX CARPAL TUNNEL RELEASE      HX CATARACT REMOVAL Right 12/30/2019    HX CATARACT REMOVAL Left 01/06/2020    HX CHOLECYSTECTOMY      HX EXPOSURE TO METAL SHAVINGS      HX HEART CATHETERIZATION      HX HYSTERECTOMY      HX OOPHORECTOMY      HX TAH AND BSO      HX TUBAL LIGATION      HX VEIN STRIPPING      Left leg    PHACO WITH INTRAOCULAR LENS Left 01/06/2020    Performed by Georgina Dunnings, MD at [facility] OR MAIN    PHACO WITH INTRAOCULAR LENS Right 12/30/2019    Performed by Georgina Dunnings, MD at [facility] OR MAIN     Family Medical History:       Problem Relation (Age of Onset)    Bone cancer Father    Diabetes Multiple family members    Hypertension (High Blood Pressure) Multiple family members    No Known Problems Mother    Stomach Cancer Paternal Grandmother            Social History     Socioeconomic History    Marital status: Married     Spouse name: [name]    Number of children: 3    Years of education: 13    Highest education level: High school graduate   Occupational History    Occupation: Retired   Tobacco Use    Smoking status: Never     Passive exposure: Never    Smokeless tobacco: Never   Vaping Use    Vaping status: Never Used   Substance and Sexual Activity    Alcohol use: No     Alcohol/week: 0.0 standard drinks of alcohol    Drug use: No    Sexual activity: Not Currently     Partners: Male   Other Topics Concern    Right  hand dominant Yes    Ability to Walk 1 Flight of Steps without SOB/CP No    Ability To Do Own ADL's Yes   Social History Narrative    MARRIED.  Lives in single story home.  Heats home with electric and has well water .        April Haney, LPN  7/75/7978, 14:01     Social Determinants of Health     Financial Resource Strain: Low Risk   (04/13/2024)    Financial Resource Strain     SDOH Financial: No   Transportation Needs: Low Risk  (04/13/2024)    Transportation Needs     SDOH Transportation: No   Social Connections: Low Risk  (04/13/2024)    Social Connections     SDOH Social Isolation: 5 or more times a week   Intimate Partner Violence: Low Risk  (04/13/2024)    Intimate Partner Violence     SDOH Domestic Violence: No   Housing Stability: Low Risk  (04/13/2024)    Housing Stability     SDOH Housing Situation: I have housing.     SDOH Housing Worry: No     Current Outpatient Medications   Medication Sig    acetaminophen  (TYLENOL ) 500 mg Oral Tablet Take 1 Tablet (500 mg total) by mouth Every night as needed Takes 2 at night    albuterol  sulfate (PROVENTIL  OR VENTOLIN  OR PROAIR ) 90 mcg/actuation Inhalation oral inhaler Take 1-2 Puffs by inhalation Every 6 hours as needed    albuterol  sulfate (PROVENTIL ) 2.5 mg /3 mL (0.083 %) Inhalation nebulizer solution Take 3 mL (2.5 mg total) by nebulization Three times a day as needed for Wheezing    calcium carbonate/vitamin D3 (VITAMIN D -3 ORAL) Take 2,000 Int'l Units/day by mouth Daily    ezetimibe  (ZETIA ) 10 mg Oral Tablet Take 1 Tablet (10 mg total) by mouth Every evening    FLUoxetine  (PROZAC ) 40 mg Oral Capsule Take 1 Capsule (40 mg total) by mouth Daily    furosemide  (LASIX ) 20 mg Oral Tablet TAKE 1 TABLET (20 MG TOTAL) BY MOUTH TWICE PER DAY AS NEEDED    HYDROcodone -acetaminophen  (NORCO) 5-325 mg Oral Tablet Take 1 Tablet by mouth Every 6 hours as needed for Pain    Ibuprofen  (MOTRIN ) 600 mg Oral Tablet Take 1 Tablet (600 mg total) by mouth Every 6 hours as needed for Pain    magnesium  oxide (MAG-OX) 400 mg Oral Tablet Take 1 Tablet (400 mg total) by mouth Three times a day    metoprolol  succinate (TOPROL -XL) 25 mg Oral Tablet Sustained Release 24 hr TAKE 1 TABLET BY MOUTH EVERY NIGHT.    omeprazole  (PRILOSEC) 40 mg Oral Capsule, Delayed Release(E.C.) Take 1 Capsule (40 mg total) by mouth Daily  Indications: gastroesophageal reflux disease    predniSONE  (DELTASONE ) 1 mg Oral Tablet Start taking 4 mg daily after the 5 mg prescription has been finished.  Reduce daily prednisone  dose by 1 mg every 3 weeks.    predniSONE  (DELTASONE ) 5 mg Oral Tablet Take 1 Tablet (5 mg total) by mouth Daily    tiZANidine  (ZANAFLEX ) 4 mg Oral Tablet TAKE 1 TABLET BY MOUTH THREE TIMES A DAY AS NEEDED FOR MUSCLE CRAMPS    TRULICITY  0.75 mg/0.5 mL Subcutaneous Pen Injector INJECT THE CONTENTS OF ONE PEN  SUBCUTANEOUSLY WEEKLY AS  DIRECTED     Review of Systems   Constitutional:  Negative for activity change, appetite change, chills, fatigue, fever and unexpected weight change.   HENT:  Negative  for congestion, postnasal drip, rhinorrhea, sinus pain, sore throat and trouble swallowing.    Eyes:  Negative for visual disturbance.   Respiratory:  Negative for cough, chest tightness, shortness of breath and wheezing.    Cardiovascular:  Negative for chest pain, palpitations and leg swelling.   Gastrointestinal:  Negative for abdominal pain, constipation, diarrhea, nausea and vomiting.   Genitourinary:  Negative for difficulty urinating and dysuria.   Musculoskeletal:  Negative for arthralgias, back pain and myalgias.   Skin:  Negative for rash and wound.   Neurological:  Negative for dizziness, weakness, light-headedness and headaches.   Hematological:  Negative for adenopathy.   Psychiatric/Behavioral:  Negative for confusion, decreased concentration and sleep disturbance. The patient is not nervous/anxious.    All other systems reviewed and are negative.    Objective:   BP 128/88 (Site: Left Antecubital, Patient Position: Sitting, Cuff Size: Adult)   Pulse 83   Temp 36.2 C (97.1 F) (Temporal)   Resp 18   Ht 1.626 m (5' 4.02)   Wt 94.8 kg (209 lb)   SpO2 97%   BMI 35.86 kg/m       Physical Exam  Vitals reviewed.   Constitutional:       General: She is not in acute distress.     Appearance: Normal appearance. She is obese.  She is not ill-appearing, toxic-appearing or diaphoretic.   HENT:      Head: Normocephalic and atraumatic.   Eyes:      General: No scleral icterus.        Right eye: No discharge.         Left eye: No discharge.      Extraocular Movements: Extraocular movements intact.      Conjunctiva/sclera: Conjunctivae normal.   Cardiovascular:      Rate and Rhythm: Normal rate and regular rhythm.      Pulses: Normal pulses.      Heart sounds: Normal heart sounds.   Pulmonary:      Effort: Pulmonary effort is normal. No respiratory distress.      Breath sounds: Normal breath sounds. No wheezing, rhonchi or rales.   Musculoskeletal:         General: No swelling, tenderness or deformity. Normal range of motion.      Cervical back: Normal range of motion and neck supple.      Right lower leg: No edema.      Left lower leg: No edema.   Skin:     General: Skin is warm.      Capillary Refill: Capillary refill takes less than 2 seconds.   Neurological:      General: No focal deficit present.      Mental Status: She is alert and oriented to person, place, and time. Mental status is at baseline.      Cranial Nerves: No cranial nerve deficit.      Sensory: No sensory deficit.      Motor: No weakness.      Coordination: Coordination normal.      Gait: Gait normal.      Deep Tendon Reflexes: Reflexes normal.   Psychiatric:         Mood and Affect: Mood normal.         Behavior: Behavior normal.         Thought Content: Thought content normal.         Judgment: Judgment normal.         PHQ Questionnaire  Little interest  or pleasure in doing things.: Not at all  Feeling down, depressed, or hopeless: Not at all  PHQ 2 Total: 0  Trouble falling or staying asleep, or sleeping too much.: Not at all  Feeling tired or having little energy: Not at all  Poor appetite or overeating: Not at all  Feeling bad about yourself/ that you are a failure in the past 2 weeks?: Not at all  Trouble concentrating on things in the past 2 weeks?: Not at  all  Moving/Speaking slowly or being fidgety or restless  in the past 2 weeks?: Not at all  Thoughts that you would be better off DEAD, or of hurting yourself in some way.: Not at all  PHQ 9 Total: 0  Interpretation of Total Score: 0-4 No depression    Assessment/Plan     1. Contusion of rib on right side (Primary)  Stable/Improved  Continue to monitor     2. 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    No orders of the defined types were placed in this encounter.    Health Maintenance Due   Topic Date Due    Adult Tdap-Td (1 - Tdap) Never done    RSV Adult 60+ or Pregnancy (1 - 1-dose 75+ series) Never done    Diabetic Retinal Exam  10/25/2020    Osteoporosis screening  12/05/2022    Covid-19 Vaccine (5 - 2024-25 season) 08/24/2023     Follow up: Return in about 2 months (around 10/13/2024).    The patient was given ample opportunity to ask questions and those questions were answered to the patient's satisfaction. The patient was encouraged to be involved in their own care, and all diagnoses, medications, and medication side-effects were discussed. The patient was told to contact me with any additional questions or concerns, or go to the ED in the case of an emergency.       Maceo Hernan, DO

## 2024-07-29 ENCOUNTER — Other Ambulatory Visit: Payer: Self-pay

## 2024-07-29 ENCOUNTER — Encounter (INDEPENDENT_AMBULATORY_CARE_PROVIDER_SITE_OTHER): Payer: Self-pay | Admitting: Family Medicine

## 2024-07-29 ENCOUNTER — Ambulatory Visit: Attending: Family Medicine | Admitting: Family Medicine

## 2024-07-29 VITALS — BP 128/88 | HR 83 | Temp 97.1°F | Resp 18 | Ht 64.02 in | Wt 209.0 lb

## 2024-07-29 DIAGNOSIS — S20211D Contusion of right front wall of thorax, subsequent encounter: Secondary | ICD-10-CM | POA: Insufficient documentation

## 2024-07-29 DIAGNOSIS — E669 Obesity, unspecified: Secondary | ICD-10-CM | POA: Insufficient documentation

## 2024-07-29 DIAGNOSIS — Z1331 Encounter for screening for depression: Secondary | ICD-10-CM | POA: Insufficient documentation

## 2024-07-29 DIAGNOSIS — S20211A Contusion of right front wall of thorax, initial encounter: Secondary | ICD-10-CM | POA: Insufficient documentation

## 2024-07-29 DIAGNOSIS — Z6835 Body mass index (BMI) 35.0-35.9, adult: Secondary | ICD-10-CM | POA: Insufficient documentation

## 2024-07-29 NOTE — Nursing Note (Signed)
 07/29/24 1300   Please answer the following questions on a scale of 0 to 10   Overall Pain Rating (Numeric/Faces) 8   Activity- during the past 24 hrs, pain has interfered with your usual activity 8   Sleep- during the past 24 hrs, pain has interfered with your sleep 7   Mood- during the past 24 hours, pain has affected your mood 5   Stress- during the past 24 hours, pain has contributed to your stress 5

## 2024-08-25 ENCOUNTER — Other Ambulatory Visit (HOSPITAL_COMMUNITY): Payer: Self-pay

## 2024-08-25 DIAGNOSIS — E8809 Other disorders of plasma-protein metabolism, not elsewhere classified: Secondary | ICD-10-CM

## 2024-08-25 DIAGNOSIS — D472 Monoclonal gammopathy: Secondary | ICD-10-CM

## 2024-08-26 ENCOUNTER — Ambulatory Visit (HOSPITAL_COMMUNITY): Payer: Self-pay

## 2024-08-30 ENCOUNTER — Ambulatory Visit (HOSPITAL_COMMUNITY): Payer: Self-pay

## 2024-09-01 ENCOUNTER — Other Ambulatory Visit: Payer: Self-pay

## 2024-09-01 ENCOUNTER — Ambulatory Visit
Attending: Student in an Organized Health Care Education/Training Program | Admitting: Student in an Organized Health Care Education/Training Program

## 2024-09-01 ENCOUNTER — Encounter (INDEPENDENT_AMBULATORY_CARE_PROVIDER_SITE_OTHER): Payer: Self-pay | Admitting: Student in an Organized Health Care Education/Training Program

## 2024-09-01 VITALS — BP 122/78 | HR 76 | Temp 98.5°F | Ht 64.0 in | Wt 208.1 lb

## 2024-09-01 DIAGNOSIS — M810 Age-related osteoporosis without current pathological fracture: Secondary | ICD-10-CM | POA: Insufficient documentation

## 2024-09-01 DIAGNOSIS — M353 Polymyalgia rheumatica: Secondary | ICD-10-CM | POA: Insufficient documentation

## 2024-09-01 MED ORDER — PREDNISONE 1 MG TABLET: 4.0000 mg | ORAL_TABLET | Freq: Every day | ORAL | 2 refills | Status: DC

## 2024-09-01 NOTE — Patient Instructions (Signed)
 CRP and ESR lab orders in place.  Please have blood work completed later this week at your convenience.

## 2024-09-01 NOTE — Progress Notes (Signed)
 [facility]  [address]  [place] [facility] [address]    HPI:  This is a case of a 78 y.o. year old female who comes in today for follow-up of polymyalgia rheumatica    Medical conditions notable for:  -hospitalized 10/29/22-11/01/22 for acute hypoxic respiratory failure.  That have significant proximal pain and weakness in her shoulders and hips with elevated inflammatory markers.  Discharged on prednisone  50 mg daily.  -obesity BMI 36   -asthma   -coronary artery disease   -CKD stage 3   -hypertension    Polymyalgia rheumatica regimen   -prednisone  5 mg daily    Flare of presumed PMR occurred in April.  Patient increase dose to 10 mg daily with resolution symptoms.  It was recommended to check inflammatory markers.  Updated prednisone  taper prescribed.  Patient unable to make it to appointments in August and July.    She will have episodes of pain in her neck, and shoulders every couple weeks. Seems to last about 2-4 days. She did use ice on her shoulders and it helped. She has pain around her right SI joint today. Pain in right SI region started yesterday.     Initial HPI listed below  She was having neck and shoulder pain. She had been getting left sided frontal headaches at that time. No jaw locking or issues with her tongue. She had some blurry vision around that time, but no vision loss. Patient is currently on prednisone  5 mg daily for PMR for around a month. Shoulders are back to her baseline. She has had issues with heartburn before but it is well controlled currently on omeprazole . She has had esophageal dilations in the past.     Denies having any shortness of breath, chest pain, abdominal pain, fevers, unintential weight loss.    All systems reviewed and are otherwise unremarkable unless stated above.    Social Hx- no alcohol use, no smoking, resides in [place]   Family Hx- no family hx of RA or SLE    Current Outpatient Medications   Medication Sig Dispense Refill     acetaminophen  (TYLENOL ) 500 mg Oral Tablet Take 1 Tablet (500 mg total) by mouth Every night as needed Takes 2 at night      albuterol  sulfate (PROVENTIL  OR VENTOLIN  OR PROAIR ) 90 mcg/actuation Inhalation oral inhaler Take 1-2 Puffs by inhalation Every 6 hours as needed 1 Each 3    albuterol  sulfate (PROVENTIL ) 2.5 mg /3 mL (0.083 %) Inhalation nebulizer solution Take 3 mL (2.5 mg total) by nebulization Three times a day as needed for Wheezing 300 mL 5    calcium carbonate/vitamin D3 (VITAMIN D -3 ORAL) Take 2,000 Int'l Units/day by mouth Daily      ezetimibe  (ZETIA ) 10 mg Oral Tablet Take 1 Tablet (10 mg total) by mouth Every evening 90 Tablet 1    FLUoxetine  (PROZAC ) 40 mg Oral Capsule Take 1 Capsule (40 mg total) by mouth Daily 90 Capsule 1    furosemide  (LASIX ) 20 mg Oral Tablet TAKE 1 TABLET (20 MG TOTAL) BY MOUTH TWICE PER DAY AS NEEDED 180 Tablet 1    HYDROcodone -acetaminophen  (NORCO) 5-325 mg Oral Tablet Take 1 Tablet by mouth Every 6 hours as needed for Pain 10 Tablet 0    Ibuprofen  (MOTRIN ) 600 mg Oral Tablet Take 1 Tablet (600 mg total) by mouth Every 6 hours as needed for Pain 30 Tablet 0    magnesium  oxide (MAG-OX) 400 mg Oral Tablet Take 1 Tablet (  400 mg total) by mouth Three times a day 30 Tablet 0    metoprolol  succinate (TOPROL -XL) 25 mg Oral Tablet Sustained Release 24 hr TAKE 1 TABLET BY MOUTH EVERY NIGHT. 90 Tablet 1    omeprazole  (PRILOSEC) 40 mg Oral Capsule, Delayed Release(E.C.) Take 1 Capsule (40 mg total) by mouth Daily Indications: gastroesophageal reflux disease 90 Capsule 1    predniSONE  (DELTASONE ) 1 mg Oral Tablet Start taking 4 mg daily after the 5 mg prescription has been finished.  Reduce daily prednisone  dose by 1 mg every 3 weeks. 120 Tablet 1    predniSONE  (DELTASONE ) 5 mg Oral Tablet Take 1 Tablet (5 mg total) by mouth Daily 21 Tablet 0    tiZANidine  (ZANAFLEX ) 4 mg Oral Tablet TAKE 1 TABLET BY MOUTH THREE TIMES A DAY AS NEEDED FOR MUSCLE CRAMPS 270 Tablet 1    TRULICITY  0.75  mg/0.5 mL Subcutaneous Pen Injector INJECT THE CONTENTS OF ONE PEN  SUBCUTANEOUSLY WEEKLY AS  DIRECTED 6 mL 3     No current facility-administered medications for this visit.     Allergies   Allergen Reactions    Amoxicillin  Other Adverse Reaction (Add comment)     Unknown reaction    Augmentin [Amoxicillin-Pot Clavulanate] Nausea/ Vomiting    Statins-Hmg-Coa Reductase Inhibitors Myalgia     Past Medical History:   Diagnosis Date    Arthritis     Asthma     Back problem     BMI 35.0-35.9,adult 02/16/2020    CAD (coronary artery disease)     mild    Cataract     Cataracts, bilateral     CHF (congestive heart failure)     Chronic bronchitis with emphysema (CMS HCC)     Chronic low back pain     Claustrophobia     Depression     Diabetes     Diabetes mellitus, type 2     Encounter for support and coordination of transition of care 11/16/2022    Esophageal reflux     Essential hypertension 01/28/2019    H/O urinary tract infection     HH (hiatus hernia)     History of kidney disease     states, low kidney functions.    Hypercholesterolemia     Hypertension     Hypertriglyceridemia     Polymyalgia rheumatica (CMS HCC)     Type 2 diabetes mellitus          Past Surgical History:   Procedure Laterality Date    CATARACT EXTRACTION, BILATERAL Bilateral     bilateral lens implant    COLONOSCOPY      ENDOSCOPIC ULTRASOUND ESOPHGEAL  04/15/2023    ESOPHAGOGASTRODUODENOSCOPY      HX CARPAL TUNNEL RELEASE      HX CATARACT REMOVAL Right 12/30/2019    HX CATARACT REMOVAL Left 01/06/2020    HX CHOLECYSTECTOMY      HX EXPOSURE TO METAL SHAVINGS      HX HEART CATHETERIZATION      HX HYSTERECTOMY      HX OOPHORECTOMY      HX TAH AND BSO      HX TUBAL LIGATION      HX VEIN STRIPPING      Left leg     PHYSICAL EXAM:  VITALS: BP 122/78   Pulse 76   Temp 36.9 C (98.5 F) (Thermal Scan)   Ht 1.626 m (5' 4)   Wt 94.4 kg (208 lb 1.8 oz)   SpO2  96%   BMI 35.72 kg/m     General- well appearing woman resting comfortably in  chair  Neurologic- no focal deficits noted, speaks in full sentences and answers questions appropriately   Skin-  no rashes noted on exposed skin, normal appearing finger nails bilaterally   Musculoskeletal- no synovitis, normal range of motion shoulders bilaterally, no tenderness in the greater trochanteric bursa region, or subacromial bursa bilaterally, significant tenderness around right sacroiliac joint 5/5 muscle strength in upper and lower extremities  Psych- appropriate mood and corresponding affect     Labs/imaging:  Labs reviewed from 02/20/2024  WBC 16.2, hemoglobin 13.3, platelet count 333    Labs reviewed from 08/18/2023   WBC 9.4, hemoglobin 10.9, platelet count 424 (baseline platelet count typically in the 200's)      ESR 15, CRP 29 (CRP 70 from 2 weeks prior)    Region BMD  (g/cm) Young Adult  T-score Age Matched  Z-score   AP Spine 1.085 -0.8 0.0   Left Hip total 0.815 -1.5 -0.5   Left Hip Femoral Neck 0.729 -2.2 -0.9     Hip total           Hip Femoral Neck           Forearm           Forearm            Note: T-Scores (Standard deviation from normal young adult mean)  Normal T-Score > -1 BMD within 1 SD of a young normal adult   Osteopenia (low bone mass) T-Score = -1.0 to -2.5 BMD is between 1 and 2.5 SD below that of a young normal adult   Osteoporosis T-Score < -2.5 BMD is 2.5 SD or more below that of a young normal adult      FRAX* Results:      10-Year Probability of Fracture   Major Osteoporotic Fracture  20.6%% Hip Fracture  5.4%%         Assessment and Plan:  78 y.o. year old female presents today for follow-up of polymyalgia rheumatica.  Patient had significantly elevated inflammatory markers last fall when she was symptomatic from scalp tenderness, headaches, shoulder and girdle stiffness which resolved with high doses of corticosteroids.  Temporal artery biopsy negative.      Patient will occasionally have significant shoulder and neck pain which lasts 2-4 days.  I do not suspect  these episodes to be related to PMR as it resolves.  Would not recommend increasing corticosteroid dose based on these flares.  It was recommended for patient to continue use Tylenol  and ice.  If the symptoms do lasts for about a week then would recommend going to [facility] to have inflammatory markers checked    At this time would recommend reducing prednisone  from 5 mg daily to 4 mg daily.  Patient can remain on this dose until follow up.  Low suspicion transitioning to [place]  would be of significant benefit from a side effect profile while patient is on 4 mg.  However if DEXA scan shows significant worsening of bone health then will need to have discussion again about transitioning to [facility]       1. Polymyalgia rheumatica (CMS HCC)  - prednisone  4 mg daily until follow up   -patient encouraged to go have blood work completed later this week to check inflammatory markers.  Standing orders for CRP and sedimentation rate in place.  - predniSONE  (DELTASONE ) 1 mg Oral Tablet; Take 4 Tablets (4 mg total)  by mouth Daily  Dispense: 160 Tablet; Refill: 2      Osteoporosis  -patient previously counseled about Prolia.  Based on her FRAX score she would benefit however [facility] as well as [facility] facilities under estimate T-score values and therefore would recommend holding on osteoporosis.  Agree with follow up DEXA which has been ordered for October      Return in about 3 months (around 12/01/2024) for PMR, Video Visit.    Arlisa Leclere, MD

## 2024-09-13 ENCOUNTER — Ambulatory Visit (HOSPITAL_COMMUNITY): Payer: Self-pay

## 2024-09-21 ENCOUNTER — Ambulatory Visit (HOSPITAL_COMMUNITY): Payer: Self-pay

## 2024-09-21 NOTE — Progress Notes (Signed)
 PAIN MANAGEMENT, [facility] OFFICE BUILDING  [address]  [place] [address]  Operated by [facility]    Name: Joanna Black MRN:  [id number]   Date: 09/23/2024 DOB:  [birth year] (78 y.o.)         CC: Low Back Pain    Obtained history from: patient    SUBJECTIVE:  Joanna Black is a 78 y.o. female  who RETURNS to Pain Management for follow up evaluation.  Last seen 07/12/24 with excellent relief following L4-5 ESI per Dr. Court.  Today, she reports pain returned about 3 weeks ago, rates 6/10, located in right lumbar region and lateral right lower extremity.  Describes pain as dull, throbbing, sharp, worse with walking, lifting, bending, and standing.  Pain improves with ice, rest, and lying.  She is interested in additional injection therapy.  Denies interval changes in health and medications.  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  Also follows with Dr. 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  As previously documented, patient has participated in conservative treatment in the past such as physical therapy and medication management without adequate relief.    Procedures:  09/23/23 Caudal ESI with catheter- No relief  02/24/24 Right L4-5, L5-S1 RFA- 70% relief ongoing  03/23/24 Left L4-5, L5-S1 RFA- No relief  06/08/24 L4-5 ESI- 95% relief x about 3 months    Patient's past history and current information (including medical, surgical, family, social, allergy, medication) were reviewed personally today.     REVIEW OF SYSTEMS:  Other than ROS in the HPI, all other review of systems were negative except: bladder leakage (ongoing), intermittent SOB, and DM.  Denies new numbness/weakness, bowel incontinence, saddle anesthesia.    PHYSICAL EXAMINATION:    Vitals: BP (!) 153/71   Pulse 76   Temp 36.4 C (97.6 F)   Ht 1.626 m (5' 4)   Wt 93.3 kg (205 lb 11 oz)   SpO2 96%   BMI 35.31 kg/m .        GENERAL EXAMINATION:  resting in seated  position. No acute distress.   SKIN:  warm and dry  EYES:  Conjunctiva clear  HEAD/EARS/NOSE/THROAT:   normocephalic, atraumatic  NECK:   symmetric, trachea midline  CHEST WALL AND LUNGS:   Unlabored breathing, symmetrical chest excursions.  PSYCHOLOGICAL:  Affect normal. Behavior appropriate.  NEUROLOGICAL/MUSCULOSKELETAL:  Alert and oriented x3,  memory grossly intact  Visual Inspection: No appreciable muscular atrophy  Motor is 5/5 across all joints of the BLE  Sensation: normal  Coordination: Normal  Musculoskeletal tenderness:  Right lumbar paraspinals and gluteal region  Compression testing:  Positive over right SIJ  SLR:  Positive bilaterally  Gait:Nonantalgic with use of no assistive device      RADIOLOGICAL/ LAB FINDINGS:    Lab A1C Results:  HEMOGLOBIN A1C   Date Value Ref Range Status   08/18/2023 7.1 (H) 4.8 - 6.2 % Final   01/23/2023 6.4 (H) 4.8 - 6.2 % Final   12/10/2022 7.6  Final   03/13/2022 5.9  Final   11/29/2021 6.3 (H) 4.8 - 6.2 % Final       POCT A1C Results:      03/13/2022    11:00 AM 10/03/2022     4:00 PM 12/10/2022     9:00 AM 05/25/2024    11:00 AM   POCT A1c   Time Performed   09:27 11:40   A1C 5.9 6.3 7.6 7.2  MRI SPINE LUMBOSACRAL WO CONTRAST     Collection Time: 05/21/23  1:53 PM     Narrative     Bryson R Fulwider     PROCEDURE DESCRIPTION: MRI SPINE LUMBOSACRAL WO CONTRAST     CLINICAL INDICATION: R26.2: Ambulatory dysfunction     COMPARISON: No prior studies were compared.        FINDINGS: Multiplanar multisequence images lumbar spine obtained without IV contrast enhancement. There is grade 1 anterolisthesis L4 on L5. There is no bone edema. There is disc desiccation at multiple levels throughout the lumbar spine. Mild disc space narrowing is seen. Spinal cord is normal with conus terminating at L1.     There is mild disc bulging L1-2 but no significant spinal stenosis.     Mild disc bulging and degenerative facet arthropathy L2-3 without significant spinal stenosis.      Degenerative facet changes and mild disc bulging L3-4 with mild central canal stenosis and foraminal narrowing.     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     Degenerative facet changes L5-S1 with disc bulging more prominent towards the left side. There is a central disc protrusion or herniation superimposed. There is moderate left foraminal narrowing. There is mild central canal stenosis.     Paraspinal soft tissues appear normal.     No findings to suggest infectious or inflammatory process of the lumbar spine.        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 Court at [place].      Follows with:       Rheumatology, Dr. Froylan, Hx; polymyalgia rheumatica and giant cell arteritis     Dr. 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    Blood thinner: None  A1c per Dr. Tracie, PCP       Consider:  L4-5, L5-S1 TFESIs  SIJ injection  Return to PT    Our impression, treatment recommendations and plan from today's visit were reviewed in detail with the patient in the office. All of the patient's questions were answered. The procedure was explained including technique, benefits, alternatives, and the associated risks including but not limited to pain at the injection site, infection, bleeding, nerve damage, reactions to medications administered, and failure of the pain to improve.  The patient verbalized understanding and wishes to move forward  with the above noted plan.  Patient was seen independently.    Ronal Buck, APRN, CNP

## 2024-09-22 ENCOUNTER — Telehealth (HOSPITAL_BASED_OUTPATIENT_CLINIC_OR_DEPARTMENT_OTHER): Payer: Self-pay | Admitting: Nephrology

## 2024-09-22 ENCOUNTER — Ambulatory Visit
Admission: RE | Admit: 2024-09-22 | Discharge: 2024-09-22 | Disposition: A | Discharge status: Home / Self Care | Source: Ambulatory Visit | Attending: Student in an Organized Health Care Education/Training Program | Admitting: Student in an Organized Health Care Education/Training Program

## 2024-09-22 ENCOUNTER — Other Ambulatory Visit: Payer: Self-pay

## 2024-09-22 DIAGNOSIS — M810 Age-related osteoporosis without current pathological fracture: Secondary | ICD-10-CM | POA: Insufficient documentation

## 2024-09-23 ENCOUNTER — Ambulatory Visit: Payer: Self-pay | Attending: Family Medicine | Admitting: Neurological Surgery

## 2024-09-23 ENCOUNTER — Encounter (INDEPENDENT_AMBULATORY_CARE_PROVIDER_SITE_OTHER): Payer: Self-pay | Admitting: Neurological Surgery

## 2024-09-23 VITALS — BP 153/71 | HR 76 | Temp 97.6°F | Ht 64.0 in | Wt 205.7 lb

## 2024-09-23 DIAGNOSIS — M48061 Spinal stenosis, lumbar region without neurogenic claudication: Secondary | ICD-10-CM | POA: Insufficient documentation

## 2024-09-23 DIAGNOSIS — M4316 Spondylolisthesis, lumbar region: Secondary | ICD-10-CM | POA: Insufficient documentation

## 2024-09-23 DIAGNOSIS — M5416 Radiculopathy, lumbar region: Secondary | ICD-10-CM | POA: Insufficient documentation

## 2024-09-23 DIAGNOSIS — M4726 Other spondylosis with radiculopathy, lumbar region: Secondary | ICD-10-CM

## 2024-09-23 DIAGNOSIS — M47816 Spondylosis without myelopathy or radiculopathy, lumbar region: Secondary | ICD-10-CM | POA: Insufficient documentation

## 2024-09-23 NOTE — Nursing Note (Signed)
Pain and Function:      Pain Rating Scale     On a scale of 0-10, during the past 24 hours, pain has interfered with you usual activity: 8     On a scale of 0-10, during the past 24 hours, pain has interfered with your sleep: 0    On a scale of 0-10, during the past 24 hours, pain has affected your mood: 2     On a scale of 0-10, during the past 24 hours, pain has contributed to your stress: 2     On a scale of 0-10, what is your overall pain Rating: 6

## 2024-09-29 ENCOUNTER — Other Ambulatory Visit: Payer: Self-pay

## 2024-09-29 ENCOUNTER — Ambulatory Visit: Attending: Family Medicine

## 2024-09-29 ENCOUNTER — Ambulatory Visit (INDEPENDENT_AMBULATORY_CARE_PROVIDER_SITE_OTHER): Payer: Self-pay | Admitting: Family Medicine

## 2024-09-29 DIAGNOSIS — N390 Urinary tract infection, site not specified: Secondary | ICD-10-CM

## 2024-09-29 LAB — URINALYSIS, MICROSCOPIC

## 2024-09-29 LAB — URINALYSIS, MACRO/MICRO
BILIRUBIN: NEGATIVE mg/dL
BLOOD: NEGATIVE mg/dL
GLUCOSE: NEGATIVE mg/dL
KETONES: NEGATIVE mg/dL
NITRITE: POSITIVE — AB
PH: 6 (ref 4.5–8.0)
PROTEIN: NEGATIVE mg/dL
SPECIFIC GRAVITY: 1.015 (ref 1.001–1.035)
UROBILINOGEN: 0.2 mg/dL (ref 0.2–1.0)

## 2024-09-29 MED ORDER — CIPROFLOXACIN 500 MG TABLET
500.0000 mg | ORAL_TABLET | Freq: Two times a day (BID) | ORAL | 0 refills | Status: DC
Start: 2024-09-29 — End: 2024-10-12

## 2024-09-29 NOTE — Telephone Encounter (Signed)
 Please make patient aware order placed for UA.  Patient to provide urine specimen to hospital prior to starting antibiotics.  Order placed for Cipro  500 mg p.o. b.i.d. to pharmacy.  Thank you.

## 2024-09-29 NOTE — Telephone Encounter (Signed)
 Patient called and left a message saying she has a UTI. Wanted to know if PCP would call in a good medication and not one that goes on for days and days to the pharmacy.    Geofm Chow, [place] 09/29/2024 09:25

## 2024-09-29 NOTE — Telephone Encounter (Signed)
 Called and informed patient of doctors notes, patient voiced understanding.    Geofm Chow, [place] 09/29/2024 10:12

## 2024-09-30 ENCOUNTER — Telehealth (HOSPITAL_COMMUNITY): Payer: Self-pay

## 2024-09-30 NOTE — Telephone Encounter (Signed)
 Pt has 4 cancellations and 1 no show. Do you wish to reschedule appt?

## 2024-10-01 ENCOUNTER — Telehealth (HOSPITAL_BASED_OUTPATIENT_CLINIC_OR_DEPARTMENT_OTHER): Payer: Self-pay | Admitting: Nephrology

## 2024-10-01 NOTE — Telephone Encounter (Signed)
 Called patient to remind of appt 10/07/24 at 2pm and that labs need completed prior. Patient acknowledged and repeated x2.       Leita Collie, MA

## 2024-10-04 ENCOUNTER — Other Ambulatory Visit: Payer: Self-pay

## 2024-10-04 ENCOUNTER — Ambulatory Visit

## 2024-10-04 DIAGNOSIS — E1122 Type 2 diabetes mellitus with diabetic chronic kidney disease: Secondary | ICD-10-CM | POA: Insufficient documentation

## 2024-10-04 DIAGNOSIS — N1832 Chronic kidney disease, stage 3b: Secondary | ICD-10-CM | POA: Insufficient documentation

## 2024-10-04 DIAGNOSIS — E119 Type 2 diabetes mellitus without complications: Secondary | ICD-10-CM | POA: Insufficient documentation

## 2024-10-04 LAB — BASIC METABOLIC PANEL
ANION GAP: 9 mmol/L (ref 4–13)
BUN/CREA RATIO: 15 (ref 6–22)
BUN: 22 mg/dL (ref 8–25)
CALCIUM: 9.2 mg/dL (ref 8.6–10.3)
CHLORIDE: 101 mmol/L (ref 96–111)
CO2 TOTAL: 26 mmol/L (ref 23–31)
CREATININE: 1.43 mg/dL — ABNORMAL HIGH (ref 0.60–1.05)
GLUCOSE: 217 mg/dL — ABNORMAL HIGH (ref 65–125)
POTASSIUM: 4.7 mmol/L (ref 3.5–5.1)
SODIUM: 136 mmol/L (ref 136–145)
eGFRcr - FEMALE: 37 mL/min/1.73mˆ2 — ABNORMAL LOW (ref >=60)

## 2024-10-05 ENCOUNTER — Ambulatory Visit (HOSPITAL_COMMUNITY): Payer: Self-pay

## 2024-10-06 ENCOUNTER — Telehealth (HOSPITAL_COMMUNITY): Payer: Self-pay

## 2024-10-06 NOTE — Telephone Encounter (Signed)
 Pt left a voicemail stating sh got a letter in the mail about her having some missed appts. She was demanding to speak to someone about this, she didn't sound happy. I did attempt to call her back but no answer, so I left her  a voicemail letting her know  I was returning her call

## 2024-10-06 NOTE — Telephone Encounter (Signed)
 Pt called me back, was upset that when she was cancelling her appts in my chart that someone here was just making her another appointment without contacting her first, she stated that she was sent to us  for some issues with her blood work and all is fine, pt stated she lives in [place] and is 78 years old and can not travel for unnecessary appts. She does not want anymore appts made for her at this time, states if she has anymore issues she will contact us 

## 2024-10-07 ENCOUNTER — Telehealth (HOSPITAL_BASED_OUTPATIENT_CLINIC_OR_DEPARTMENT_OTHER): Payer: Self-pay | Admitting: Emergency Medicine

## 2024-10-07 ENCOUNTER — Ambulatory Visit (HOSPITAL_BASED_OUTPATIENT_CLINIC_OR_DEPARTMENT_OTHER): Payer: Self-pay | Admitting: Nephrology

## 2024-10-07 NOTE — Telephone Encounter (Signed)
 Called patient to schedule RIGHT L4/5 LESI at [facility]. No answer, left message on home and  son's mobile phone with my direct number to call and schedule.       Joanna Black

## 2024-10-08 ENCOUNTER — Telehealth (HOSPITAL_BASED_OUTPATIENT_CLINIC_OR_DEPARTMENT_OTHER): Payer: Self-pay | Admitting: Emergency Medicine

## 2024-10-08 NOTE — Telephone Encounter (Signed)
 Patent's son returned call to schedule * RIGHT L4/5 LESI 10/15/24 at 215 arrive at 36. Follow up 11/11/24 at 245 with [facility]  Patient normally get injections at [facility] but she is in a lot of pain so gave them an sooner date at [facility]. Son wanted her in asap at any facility.     Went over procedure instructions with patient who verbalized understanding and had no further questions. Verified insurance [employer] and medication holds, NONE. Mailed instructions and appointment reminders to patient.       Joanna Black

## 2024-10-09 ENCOUNTER — Emergency Department (HOSPITAL_COMMUNITY)

## 2024-10-09 ENCOUNTER — Encounter (HOSPITAL_COMMUNITY): Payer: Self-pay

## 2024-10-09 ENCOUNTER — Emergency Department
Admission: EM | Admit: 2024-10-09 | Discharge: 2024-10-09 | Disposition: A | Discharge status: Home / Self Care | Attending: Emergency Medicine | Admitting: Emergency Medicine

## 2024-10-09 ENCOUNTER — Other Ambulatory Visit: Payer: Self-pay

## 2024-10-09 DIAGNOSIS — M940 Chondrocostal junction syndrome [Tietze]: Secondary | ICD-10-CM | POA: Insufficient documentation

## 2024-10-09 LAB — COMPREHENSIVE METABOLIC PANEL, NON-FASTING
ALBUMIN: 3.2 g/dL — ABNORMAL LOW (ref 3.4–4.8)
ALKALINE PHOSPHATASE: 84 U/L (ref 55–145)
ALT (SGPT): 7 U/L (ref <31)
ANION GAP: 11 mmol/L (ref 4–13)
AST (SGOT): 18 U/L (ref 11–34)
BILIRUBIN TOTAL: 0.6 mg/dL (ref 0.3–1.3)
BUN/CREA RATIO: 16 (ref 6–22)
BUN: 20 mg/dL (ref 8–25)
CALCIUM: 9 mg/dL (ref 8.6–10.3)
CHLORIDE: 99 mmol/L (ref 96–111)
CO2 TOTAL: 23 mmol/L (ref 23–31)
CREATININE: 1.27 mg/dL — ABNORMAL HIGH (ref 0.60–1.05)
GLUCOSE: 213 mg/dL — ABNORMAL HIGH (ref 65–125)
POTASSIUM: 4.3 mmol/L (ref 3.5–5.1)
PROTEIN TOTAL: 7.3 g/dL (ref 6.0–8.0)
SODIUM: 133 mmol/L — ABNORMAL LOW (ref 136–145)
eGFRcr - FEMALE: 43 mL/min/1.73mˆ2 — ABNORMAL LOW (ref >=60)

## 2024-10-09 LAB — CBC WITH DIFF
BASOPHIL #: <0.1 x10ˆ3/uL (ref <=0.20)
BASOPHIL %: 0.2 %
EOSINOPHIL #: <0.1 x10ˆ3/uL (ref <=0.50)
EOSINOPHIL %: 0.1 %
HCT: 37.3 % (ref 34.8–46.0)
HGB: 11.6 g/dL (ref 11.5–16.0)
IMMATURE GRANULOCYTE #: 0.1 x10ˆ3/uL — ABNORMAL HIGH (ref <0.10)
IMMATURE GRANULOCYTE %: 0.7 % (ref 0.0–1.0)
LYMPHOCYTE #: 0.61 x10ˆ3/uL — ABNORMAL LOW (ref 1.00–4.80)
LYMPHOCYTE %: 4.1 %
MCH: 29.1 pg (ref 26.0–32.0)
MCHC: 31.1 g/dL (ref 31.0–35.5)
MCV: 93.5 fL (ref 78.0–100.0)
MONOCYTE #: 0.58 x10ˆ3/uL (ref 0.20–1.10)
MONOCYTE %: 3.9 %
MPV: 11.4 fL (ref 8.7–12.5)
NEUTROPHIL #: 13.46 x10ˆ3/uL — ABNORMAL HIGH (ref 1.50–7.70)
NEUTROPHIL %: 91 %
PLATELETS: 267 x10ˆ3/uL (ref 150–400)
RBC: 3.99 x10ˆ6/uL (ref 3.85–5.22)
RDW-CV: 14.7 % (ref 11.5–15.5)
WBC: 14.8 x10ˆ3/uL — ABNORMAL HIGH (ref 3.7–11.0)

## 2024-10-09 LAB — TROPONIN-I: TROPONIN-I HS: 4.1 ng/L (ref <=14.0)

## 2024-10-09 LAB — CREATINE KINASE (CK), TOTAL, SERUM OR PLASMA: CREATINE KINASE: 17 U/L — ABNORMAL LOW (ref 25–190)

## 2024-10-09 LAB — LIPASE: LIPASE: 6 U/L — ABNORMAL LOW (ref 10–60)

## 2024-10-09 MED ORDER — KETOROLAC 30 MG/ML (1 ML) INJECTION SOLUTION
15.0000 mg | INTRAMUSCULAR | Status: AC
Start: 2024-10-09 — End: 2024-10-09
  Administered 2024-10-09: 15 mg via INTRAVENOUS
  Filled 2024-10-09: qty 1

## 2024-10-09 MED ORDER — NAPROXEN 500 MG TABLET
500.0000 mg | ORAL_TABLET | Freq: Two times a day (BID) | ORAL | 0 refills | Status: AC
Start: 2024-10-09 — End: ?

## 2024-10-09 MED ORDER — HYDROCODONE 5 MG-ACETAMINOPHEN 325 MG TABLET
1.0000 | ORAL_TABLET | ORAL | Status: AC
Start: 2024-10-09 — End: 2024-10-09
  Administered 2024-10-09: 1 via ORAL
  Filled 2024-10-09: qty 1

## 2024-10-09 NOTE — ED Nurses Note (Signed)
 Patient discharged home with family. Discharge instructions reviewed. Patient encouraged to follow up as directed by provider. Return to the nearest emergency room with any new or worsening symptoms. Patient verbalized understanding and had no further questions. Printed AVS provided. Patient reports improvement in epigastric/rib pain at time of discharge. Patient declined wheelchair ambulating independently out of facility no s/s of distress noted.

## 2024-10-09 NOTE — Discharge Instructions (Addendum)
 1. Activity low level   2. Continue to currently take prednisone  as previously prescribed  3. Prescription for Naprosyn  take 1 tablet twice daily for the next 10 days this will help with pain and inflammation  4. Return to emergency department if signs or symptoms worsen

## 2024-10-09 NOTE — ED Provider Notes (Signed)
 Department of Emergency Medicine  [facility]  10/09/2024        Patient name: Joanna Black  Patient DOB: 1946-08-04        Patient is a 78 y.o.  female presenting to the ED with chief complaint of epigastric pain.  Patient presents with multiple chronic complaints.  She is able to narrow her chronic complaints down to acute complaint of epigastric pain tonight.  Associated with this pain with some shortness of breath.  Her home albuterol  did not help the shortness of breath.  She indicates the pain to be in the epigastric area and along the left lower rib section.  She denies any fevers.       Review of Systems:  All other symptoms reviewed and are negative, unless commented on in the HPI.     Past Medical History:  Past Medical History:  Past Medical History:   Diagnosis Date    Arthritis     Asthma     Back problem     BMI 35.0-35.9,adult 02/16/2020    CAD (coronary artery disease)     mild    Cataract     Cataracts, bilateral     CHF (congestive heart failure)     Chronic bronchitis with emphysema     Chronic low back pain     Claustrophobia     Depression     Diabetes     Diabetes mellitus, type 2     Encounter for support and coordination of transition of care 11/16/2022    Esophageal reflux     Essential hypertension 01/28/2019    H/O urinary tract infection     HH (hiatus hernia)     History of kidney disease     states, low kidney functions.    Hypercholesterolemia     Hypertension     Hypertriglyceridemia     Polymyalgia rheumatica (CMS HCC)     Type 2 diabetes mellitus        Past Surgical History:  Past Surgical History:   Procedure Laterality Date    Cataract extraction, bilateral Bilateral     Colonoscopy      Endoscopic ultrasound esophgeal  04/15/2023    Esophagogastroduodenoscopy      Hx carpal tunnel release      Hx cataract removal Right 12/30/2019    Hx cataract removal Left 01/06/2020    Hx cholecystectomy      Hx exposure to metal shavings      Hx heart catheterization       Hx hysterectomy      Hx oophorectomy      Hx tah and bso      Hx tubal ligation      Hx vein stripping         Social History:  Social History[1]  Social History     Substance and Sexual Activity   Drug Use No       Family History:  Family History   Problem Relation Age of Onset    No Known Problems Mother     Bone cancer Father     Diabetes Multiple family members     Hypertension (High Blood Pressure) Multiple family members     Stomach Cancer Paternal Grandmother          Above history reviewed.  Allergies, medication list, and old records also reviewed.     Physical Exam:  ED Triage Vitals [10/09/24 0247]   BP (Non-Invasive) (!) 168/75  Heart Rate 93   Respiratory Rate (!) 24   Temperature 36.4 C (97.6 F)   SpO2 93 %   Weight 93 kg (205 lb)   Height 1.626 m (5' 4)       Nursing note and vitals reviewed.  Vital signs reviewed as above.  No acute distress.   Constitutional: Patient is well-developed and well-nourished.   Head: Normocephalic and atraumatic.  Eyes: Conjunctivae are normal. Pupils are equal, round, and reactive to light.  Mouth: Oral mucosa is pink moist.  Neck: Soft, supple, full range of motion.  Pulmonary/Chest: No respiratory distress, lungs clear to auscultation bilateral   Cardiac: Regular rate and rhythm, no murmurs auscultated  Abdominal:  Soft , mild tenderness in the epigastrium.  Tenderness is more pronounced along the costal border on the left side, non distended, no guarding, no palpable massess  Musculoskeletal: Normal range of motion. No deformities.    Neurological:  Alert and oriented x4  No focal deficits noted.  Skin: No rash or lesions  Psychiatric: Patient has a normal mood and affect.        Workup:     Labs:  Results for orders placed or performed during the hospital encounter of 10/09/24 (from the past 24 hours)   COMPREHENSIVE METABOLIC PANEL, NON-FASTING    Collection Time: 10/09/24  3:16 AM   Result Value Ref Range    SODIUM 133 (L) 136 - 145 mmol/L    POTASSIUM 4.3  3.5 - 5.1 mmol/L    CHLORIDE 99 96 - 111 mmol/L    CO2 TOTAL 23 23 - 31 mmol/L    ANION GAP 11 4 - 13 mmol/L    BUN 20 8 - 25 mg/dL    CREATININE 8.72 (H) 0.60 - 1.05 mg/dL    BUN/CREA RATIO 16 6 - 22    eGFRcr - FEMALE 43 (L) >=60 mL/min/1.67m^2    ALBUMIN 3.2 (L) 3.4 - 4.8 g/dL    CALCIUM 9.0 8.6 - 89.6 mg/dL    GLUCOSE 786 (H) 65 - 125 mg/dL    ALKALINE PHOSPHATASE 84 55 - 145 U/L    ALT (SGPT) 7 <31 U/L    AST (SGOT)  18 11 - 34 U/L    BILIRUBIN TOTAL 0.6 0.3 - 1.3 mg/dL    PROTEIN TOTAL 7.3 6.0 - 8.0 g/dL   CBC/DIFF    Collection Time: 10/09/24  3:16 AM    Narrative    The following orders were created for panel order CBC/DIFF.  Procedure                               Abnormality         Status                     ---------                               -----------         ------                     CBC WITH IPQQ[[id number]]                Abnormal            Final result  Please view results for these tests on the individual orders.   CREATINE KINASE (CK), TOTAL, SERUM    Collection Time: 10/09/24  3:16 AM   Result Value Ref Range    CREATINE KINASE 17 (L) 25 - 190 U/L   LIPASE    Collection Time: 10/09/24  3:16 AM   Result Value Ref Range    LIPASE 6 (L) 10 - 60 U/L   TROPONIN-I    Collection Time: 10/09/24  3:16 AM   Result Value Ref Range    TROPONIN-I HS 4.1 <=14.0 ng/L   CBC WITH DIFF    Collection Time: 10/09/24  3:16 AM   Result Value Ref Range    WBC 14.8 (H) 3.7 - 11.0 x10^3/uL    RBC 3.99 3.85 - 5.22 x10^6/uL    HGB 11.6 11.5 - 16.0 g/dL    HCT 62.6 65.1 - 53.9 %    MCV 93.5 78.0 - 100.0 fL    MCH 29.1 26.0 - 32.0 pg    MCHC 31.1 31.0 - 35.5 g/dL    RDW-CV 85.2 88.4 - 84.4 %    PLATELETS 267 150 - 400 x10^3/uL    MPV 11.4 8.7 - 12.5 fL    NEUTROPHIL % 91.0 %    LYMPHOCYTE % 4.1 %    MONOCYTE % 3.9 %    EOSINOPHIL % 0.1 %    BASOPHIL % 0.2 %    NEUTROPHIL # 13.46 (H) 1.50 - 7.70 x10^3/uL    LYMPHOCYTE # 0.61 (L) 1.00 - 4.80 x10^3/uL    MONOCYTE # 0.58 0.20 - 1.10 x10^3/uL    EOSINOPHIL #  <0.10 <=0.50 x10^3/uL    BASOPHIL # <0.10 <=0.20 x10^3/uL    IMMATURE GRANULOCYTE % 0.7 0.0 - 1.0 %    IMMATURE GRANULOCYTE # 0.10 (H) <0.10 x10^3/uL       Imaging:    Results for orders placed or performed during the hospital encounter of 10/09/24 (from the past 72 hours)   XR AP MOBILE CHEST     Status: None    Narrative    Nevena R Crupi    PROCEDURE DESCRIPTION: XR AP MOBILE CHEST    CLINICAL INDICATION: Pneumonia    TECHNIQUE: 1 views / 1 images submitted.    COMPARISON: 07/27/2024      FINDINGS:     The heart size is within normal limits. The mediastinum and hilar regions appear unremarkable. The lung fields are free of infiltrate or vascular engorgement.      Impression    1. No acute process identified radiographically.                    Radiologist location ID: [id number]     CT ABDOMEN PELVIS WO IV CONTRAST     Status: None    Narrative    Nyellie R Friedlander    PROCEDURE DESCRIPTION: CT ABDOMEN PELVIS WO IV CONTRAST    CLINICAL INDICATION: Abdominal pain, leukocytosis    COMPARISON: No prior studies were compared.      FINDINGS: Lung bases are clear. Heart size normal.    Cholecystectomy changes are present. The liver, spleen, adrenal glands, and pancreas visualized appear unremarkable.    Kidneys demonstrate no calculi or obstructive uropathy.    The aorta tapers normally. Minimal calcific atherosclerotic changes are present.    Urinary bladder is moderately well-distended. No bladder abnormality noted. Uterus is absent. No mass or free fluid. Calcified phleboliths are present.    The bowel  gas pattern is normal. Appendix is normal. There are some scattered colonic diverticuli are present. No diverticulitis noted.      Impression    1. No acute abdominopelvic process.          The CT exam was performed using one or more the following a dose reduction techniques: Automated exposure control, adjustment of the mA and/or kV according to the patient's size, or use of iterative reconstruction  technique.          Radiologist location ID: [id number]         Orders Placed This Encounter    CANCELED: XR ABD FLAT AND UPRIGHT SERIES (W PA CHEST)    CT ABDOMEN PELVIS WO IV CONTRAST    XR AP MOBILE CHEST    COMPREHENSIVE METABOLIC PANEL, NON-FASTING    CBC/DIFF    CREATINE KINASE (CK), TOTAL, SERUM    LIPASE    TROPONIN-I    CBC WITH DIFF    ECG 12-LEAD    INSERT & MAINTAIN PERIPHERAL IV ACCESS    ketorolac  (TORADOL ) 30 mg/mL injection    HYDROcodone -acetaminophen  (NORCO) 5-325 mg per tablet    naproxen  (NAPROSYN ) 500 mg Oral Tablet     EKG:  Heart rate 83 beats per minute, sinus rhythm, left axis deviation, LVH     Procedures    MDM:   During the patient's stay in the emergency department, the above listed imaging and/or labs were performed to assist with medical decision making and were reviewed by myself when available for review.     Differential diagnosis would include but not limited to pneumonia, chest wall pain, acute pancreatitis, diverticulitis, acute appendicitis, bowel obstruction.      Slight leukocytosis with a 14,000 white count.  She had some mild tenderness in the epigastric region but mostly overlying the costochondral border on the left side with palpation.  Chest x-ray did not show any underlying pneumonia.  There was no pneumothorax.  CT scan was ordered to evaluate the abdomen.  There was no bowel obstruction there was no evidence of pancreatitis on CT findings.  This is consistent with a normal lipase as well.  CT further does not show any evidence of acute appendicitis or other acute pathology of the abdomen.  I suspect that her discomfort in the epigastric region in the palpation tenderness along the costal border is likely due to some costochondritis.  She is already on prednisone .  We will have her continue to take this.  We will have her take Naprosyn  500 mg twice daily for the next 10 days have her follow up with Dr. Marinda in the next couple days for re-evaluation.  Discussed this  with the patient she is happy with the plan.     Patient remained stable throughout the emergency department course.      Impression:   Clinical Impression   Costochondritis (Primary)                 Disposition:Discharged              Cheryl LITTIE Bors, DO  10/09/2024, 03:03           [1]   Social History  Tobacco Use    Smoking status: Never     Passive exposure: Never    Smokeless tobacco: Never   Vaping Use    Vaping status: Never Used   Substance Use Topics    Alcohol use: No     Alcohol/week: 0.0 standard drinks of  alcohol    Drug use: No

## 2024-10-11 ENCOUNTER — Other Ambulatory Visit (INDEPENDENT_AMBULATORY_CARE_PROVIDER_SITE_OTHER): Payer: Self-pay

## 2024-10-11 ENCOUNTER — Ambulatory Visit (INDEPENDENT_AMBULATORY_CARE_PROVIDER_SITE_OTHER): Payer: Self-pay | Admitting: Family Medicine

## 2024-10-11 DIAGNOSIS — M940 Chondrocostal junction syndrome [Tietze]: Secondary | ICD-10-CM | POA: Insufficient documentation

## 2024-10-11 DIAGNOSIS — Z7189 Other specified counseling: Secondary | ICD-10-CM

## 2024-10-11 LAB — ECG 12-LEAD
Atrial Rate: 83 {beats}/min
Calculated P Axis: 41 degrees
Calculated R Axis: -36 degrees
Calculated T Axis: 89 degrees
PR Interval: 180 ms
QRS Duration: 118 ms
QT Interval: 410 ms
QTC Calculation: 481 ms
Ventricular rate: 83 {beats}/min

## 2024-10-11 NOTE — Telephone Encounter (Signed)
 Called and informed patient of doctors notes, patient voiced understanding and is scheduled for follow up per PCP.    Geofm Chow, [place] 10/11/2024 09:56

## 2024-10-11 NOTE — Telephone Encounter (Signed)
 Patient called and left a message saying she was in the hospital on Friday night and was told to follow up with PCP.    Geofm Chow, [place] 10/11/2024 09:26    Called patient back and she stated that she was still having pain around her ribs, she had a CT scan done along with other testing and wanted to know if PCP could just talk to her versus having to come in. Patient stated she is still taking all of her medication.    Geofm Chow, [place] 10/11/2024 09:28

## 2024-10-11 NOTE — Telephone Encounter (Signed)
 Please return patient call and see if we can get her scheduled for in office follow up for this week. Thanks.

## 2024-10-11 NOTE — Nursing Note (Signed)
 Post Ed Follow-Up    Post ED Follow-Up:   Document completed and/or attempted interactive contact(s) after transition to home after emergency department stay.:   Transition Facility and relevant Date:   Discharge Date: 10/09/24  Discharge from [facility] Emergency Department?: Yes  Discharge Facility: [facility]  Contacted by: Celina Lou RN  Contact method: Patient/Caregiver Telephone  Contact first attempt: 10/11/2024 12:48 PM  Contact second attempt: 10/11/2024 12:50 PM  MyChart message sent?: Yes  Follow Up Visit: No answer - left message  Outgoing call to patient for ED discharge follow up,  No answer. LVM with office number.  MyChart message sent.       Celina Lou, RN  10/11/2024 12:50

## 2024-10-12 NOTE — Progress Notes (Unsigned)
 [facility]  [address]  [place], [place] [address]  P: [phone]  F: [phone]       NAME:  Joanna Black  DOB:  Jul 06, 1946  AGE:  78 y.o.  MRN:  [id number]  APPT:  10/13/2024     No chief complaint on file.       Subjective:     This is a case of a 78 y.o. year old female who comes in today for ***    Past Medical History:   Diagnosis Date    Arthritis     Asthma     Back problem     BMI 35.0-35.9,adult 02/16/2020    CAD (coronary artery disease)     mild    Cataract     Cataracts, bilateral     CHF (congestive heart failure)     Chronic bronchitis with emphysema     Chronic low back pain     Claustrophobia     Depression     Diabetes     Diabetes mellitus, type 2     Encounter for support and coordination of transition of care 11/16/2022    Esophageal reflux     Essential hypertension 01/28/2019    H/O urinary tract infection     HH (hiatus hernia)     History of kidney disease     states, low kidney functions.    Hypercholesterolemia     Hypertension     Hypertriglyceridemia     Polymyalgia rheumatica (CMS HCC)     Type 2 diabetes mellitus          Past Surgical History:   Procedure Laterality Date    (10AM) CAUDAL EPIDURAL INJECTION  **DIABETIC** N/A 09/23/2023    Performed by Court Lot, DO at [facility] OR MAIN    (1145AM) #1 BILATERAL L4/5, L5/S1 LUMBAR MEDIAL BRANCH BLOCK **DIABETIC** **GIVE 1MG  ATIVAN ON ARRIVAL** Bilateral 01/27/2024    Performed by Court Lot, DO at [facility] OR MAIN    (1145AM) #1 BILATERAL L4/5, L5/S1 LUMBAR MEDIAL BRANCH BLOCK **DIABETIC** **GIVE 1MG  ATIVAN ON ARRIVAL** Bilateral 01/27/2024    Performed by Court Lot, DO at [facility] OR MAIN    (1145AM) #2 BILATERAL L4/5, L5/S1 LUMBAR MEDIAL BRANCH BLOCK **DIABETIC** **GIVE 1MG  ATIVAN ON ARRIVAL** Bilateral 02/10/2024    Performed by Court Lot, DO at [facility] OR MAIN    (1145AM) LEFT L4/5 LUMBAR EPIDURAL STEROID INJECTION UPDATED A1C 7.2(05/25/24) **DIABETIC** Left 06/08/2024    Performed by Court Lot, DO at [facility] OR MAIN     (1145AM) LEFT L4/5, L5/S1 LUMBAR MEDIAL BRANCH NERVE RADILFREQUENCY ABLATION **DIABETIC** **GIVE 1MG  ATIVAN ON ARRIVAL** Left 03/23/2024    Performed by Court Lot, DO at [facility] OR MAIN    (1145AM) LEFT L4/5, L5/S1 LUMBAR MEDIAL BRANCH NERVE RADILFREQUENCY ABLATION **DIABETIC** **GIVE 1MG  ATIVAN ON ARRIVAL** Left 03/23/2024    Performed by Court Lot, DO at [facility] OR MAIN    (1145AM) RIGHT L4/5, L5/S1 LUMBAR MEDIAL BRANCH NERVE RADILFREQUENCY ABLATION **DIABETIC** **GIVE 1MG  ATIVAN ON ARRIVAL** Right 02/24/2024    Performed by Court Lot, DO at [facility] OR MAIN    (1145AM) RIGHT L4/5, L5/S1 LUMBAR MEDIAL BRANCH NERVE RADILFREQUENCY ABLATION **DIABETIC** **GIVE 1MG  ATIVAN ON ARRIVAL** Right 02/24/2024    Performed by Court Lot, DO at [facility] OR MAIN    CATARACT EXTRACTION, BILATERAL Bilateral     bilateral lens implant    COLONOSCOPY      CYSTOSCOPY N/A 05/26/2023    Performed by  Fausto Alm FALCON, MD at [facility] OR MAIN    ENDOSCOPIC U/S UPPER with FNA N/A 04/15/2023    Performed by Cammie Iha, DO at [facility] OR ENDO    ENDOSCOPIC U/S UPPER with FNA N/A 05/10/2022    Performed by Cammie Iha, DO at [facility] OR ENDO    ENDOSCOPIC ULTRASOUND ESOPHGEAL  04/15/2023    ESOPHAGOGASTRODUODENOSCOPY      GASTROSCOPY  05/10/2022    Performed by Cammie Iha, DO at [facility] OR ENDO    GASTROSCOPY WITH DILATION Left 06/02/2024    Performed by Reinaldo Exon, MD at [facility] OR ENDO    GASTROSCOPY WITH DILATION Left 09/24/2022    Performed by Reinaldo Exon, MD at [facility] OR ENDO    GASTROSCOPY WITH DILATION Left 06/05/2022    Performed by Reinaldo Exon, MD at [facility] OR ENDO    GASTROSCOPY WITH DILATION and bxs Left 05/07/2022    Performed by Reinaldo Exon, MD at [facility] OR ENDO    HX CARPAL TUNNEL RELEASE      HX CATARACT REMOVAL Right 12/30/2019    HX CATARACT REMOVAL Left 01/06/2020    HX CHOLECYSTECTOMY      HX EXPOSURE TO METAL SHAVINGS      HX HEART CATHETERIZATION      HX HYSTERECTOMY      HX OOPHORECTOMY      HX TAH AND BSO      HX TUBAL  LIGATION      HX VEIN STRIPPING      Left leg    PHACO WITH INTRAOCULAR LENS Left 01/06/2020    Performed by Georgina Dunnings, MD at [facility] OR MAIN    PHACO WITH INTRAOCULAR LENS Right 12/30/2019    Performed by Georgina Dunnings, MD at [facility] OR MAIN     Family Medical History:       Problem Relation (Age of Onset)    Bone cancer Father    Diabetes Multiple family members    Hypertension (High Blood Pressure) Multiple family members    No Known Problems Mother    Stomach Cancer Paternal Grandmother            Social History     Socioeconomic History    Marital status: Married     Spouse name: [name]    Number of children: 3    Years of education: 13    Highest education level: High school graduate   Occupational History    Occupation: Retired   Tobacco Use    Smoking status: Never     Passive exposure: Never    Smokeless tobacco: Never   Vaping Use    Vaping status: Never Used   Substance and Sexual Activity    Alcohol use: No     Alcohol/week: 0.0 standard drinks of alcohol    Drug use: No    Sexual activity: Not Currently     Partners: Male   Other Topics Concern    Right hand dominant Yes    Ability to Walk 1 Flight of Steps without SOB/CP No    Ability To Do Own ADL's Yes   Social History Narrative    MARRIED.  Lives in single story home.  Heats home with electric and has well water .        April Haney, LPN  7/75/7978, 14:01     Social Determinants of Health     Financial Resource Strain: Low Risk (04/13/2024)    Financial Resource Strain     SDOH Financial: No  Transportation Needs: Low Risk (04/13/2024)    Transportation Needs     SDOH Transportation: No   Social Connections: Low Risk (04/13/2024)    Social Connections     SDOH Social Isolation: 5 or more times a week   Intimate Partner Violence: Low Risk (04/13/2024)    Intimate Partner Violence     SDOH Domestic Violence: No   Housing Stability: Low Risk (04/13/2024)    Housing Stability     SDOH Housing Situation: I have housing.     SDOH Housing Worry: No     Current  Outpatient Medications   Medication Sig    acetaminophen  (TYLENOL ) 500 mg Oral Tablet Take 1 Tablet (500 mg total) by mouth Every night as needed Takes 2 at night    albuterol  sulfate (PROVENTIL  OR VENTOLIN  OR PROAIR ) 90 mcg/actuation Inhalation oral inhaler Take 1-2 Puffs by inhalation Every 6 hours as needed    albuterol  sulfate (PROVENTIL ) 2.5 mg /3 mL (0.083 %) Inhalation nebulizer solution Take 3 mL (2.5 mg total) by nebulization Three times a day as needed for Wheezing    calcium carbonate/vitamin D3 (VITAMIN D -3 ORAL) Take 2,000 Int'l Units/day by mouth Daily    ezetimibe  (ZETIA ) 10 mg Oral Tablet Take 1 Tablet (10 mg total) by mouth Every evening    FLUoxetine  (PROZAC ) 40 mg Oral Capsule Take 1 Capsule (40 mg total) by mouth Daily    furosemide  (LASIX ) 20 mg Oral Tablet TAKE 1 TABLET (20 MG TOTAL) BY MOUTH TWICE PER DAY AS NEEDED    HYDROcodone -acetaminophen  (NORCO) 5-325 mg Oral Tablet Take 1 Tablet by mouth Every 6 hours as needed for Pain    magnesium  oxide (MAG-OX) 400 mg Oral Tablet Take 1 Tablet (400 mg total) by mouth Three times a day    metoprolol  succinate (TOPROL -XL) 25 mg Oral Tablet Sustained Release 24 hr TAKE 1 TABLET BY MOUTH EVERY NIGHT.    naproxen  (NAPROSYN ) 500 mg Oral Tablet Take 1 Tablet (500 mg total) by mouth Twice daily with food    omeprazole  (PRILOSEC) 40 mg Oral Capsule, Delayed Release(E.C.) Take 1 Capsule (40 mg total) by mouth Daily Indications: gastroesophageal reflux disease    predniSONE  (DELTASONE ) 1 mg Oral Tablet Take 4 Tablets (4 mg total) by mouth Daily    tiZANidine  (ZANAFLEX ) 4 mg Oral Tablet TAKE 1 TABLET BY MOUTH THREE TIMES A DAY AS NEEDED FOR MUSCLE CRAMPS    TRULICITY  0.75 mg/0.5 mL Subcutaneous Pen Injector INJECT THE CONTENTS OF ONE PEN  SUBCUTANEOUSLY WEEKLY AS  DIRECTED     Review of Systems  Objective:   There were no vitals taken for this visit.      Physical Exam    PHQ Questionnaire       Assessment/Plan   There are no diagnoses linked to this  encounter.  {bmigant:32679}  No orders of the defined types were placed in this encounter.            {This patient MAY be non-adherent to their non-insulin  DIABETES medication. (If medication adherence given choose from SmartList. This will disappear if unselected upon signing note):46836}      Health Maintenance Due   Topic Date Due    Adult Tdap-Td (1 - Tdap) Never done    RSV Adult 60+ or Pregnancy (1 - 1-dose 75+ series) Never done    Diabetic Retinal Exam  10/25/2020    Influenza Vaccine (1) 08/23/2024    Covid-19 Vaccine (Shared decision making) (5 - 2025-26 season) 08/23/2024  Controlled Substances Tracking      Follow up: No follow-ups on file.    The patient was given ample opportunity to ask questions and those questions were answered to the patient's satisfaction. The patient was encouraged to be involved in their own care, and all diagnoses, medications, and medication side-effects were discussed. The patient was told to contact me with any additional questions or concerns, or go to the ED in the case of an emergency.       Courtnei Ruddell, DO

## 2024-10-13 ENCOUNTER — Other Ambulatory Visit: Payer: Self-pay

## 2024-10-13 ENCOUNTER — Encounter (INDEPENDENT_AMBULATORY_CARE_PROVIDER_SITE_OTHER): Payer: Self-pay | Admitting: Family Medicine

## 2024-10-13 ENCOUNTER — Ambulatory Visit (INDEPENDENT_AMBULATORY_CARE_PROVIDER_SITE_OTHER): Payer: Self-pay | Admitting: Family Medicine

## 2024-10-13 ENCOUNTER — Ambulatory Visit: Attending: Family Medicine | Admitting: Family Medicine

## 2024-10-13 VITALS — BP 122/86 | HR 74 | Temp 97.1°F | Resp 16 | Ht 64.02 in | Wt 204.0 lb

## 2024-10-13 DIAGNOSIS — E785 Hyperlipidemia, unspecified: Secondary | ICD-10-CM | POA: Insufficient documentation

## 2024-10-13 DIAGNOSIS — E1159 Type 2 diabetes mellitus with other circulatory complications: Secondary | ICD-10-CM | POA: Insufficient documentation

## 2024-10-13 DIAGNOSIS — I152 Hypertension secondary to endocrine disorders: Secondary | ICD-10-CM | POA: Insufficient documentation

## 2024-10-13 DIAGNOSIS — E119 Type 2 diabetes mellitus without complications: Secondary | ICD-10-CM | POA: Insufficient documentation

## 2024-10-13 DIAGNOSIS — E1169 Type 2 diabetes mellitus with other specified complication: Secondary | ICD-10-CM | POA: Insufficient documentation

## 2024-10-13 DIAGNOSIS — M940 Chondrocostal junction syndrome [Tietze]: Secondary | ICD-10-CM | POA: Insufficient documentation

## 2024-10-13 DIAGNOSIS — Z23 Encounter for immunization: Secondary | ICD-10-CM | POA: Insufficient documentation

## 2024-10-13 DIAGNOSIS — K219 Gastro-esophageal reflux disease without esophagitis: Secondary | ICD-10-CM | POA: Insufficient documentation

## 2024-10-13 DIAGNOSIS — N183 Chronic kidney disease, stage 3 unspecified: Secondary | ICD-10-CM | POA: Insufficient documentation

## 2024-10-13 MED ORDER — OMEPRAZOLE 40 MG CAPSULE,DELAYED RELEASE
40.0000 mg | DELAYED_RELEASE_CAPSULE | Freq: Every day | ORAL | 1 refills | Status: AC
Start: 2024-10-13 — End: ?

## 2024-10-13 NOTE — Patient Instructions (Signed)
 Vaccine Information Statement    Influenza (Flu) Vaccine (Inactivated or Recombinant): What You Need to Know    Many vaccine information statements are available in Spanish and other languages. See PromoAge.com.br.  Hojas de informacin sobre vacunas estn disponibles en espaol y en muchos otros idiomas. Visite PromoAge.com.br.    1. Why get vaccinated?    Influenza vaccine can prevent influenza (flu).    Flu is a contagious disease that spreads around the [place]  every year, usually between October and May. Anyone can get the flu, but it is more dangerous for some people. Infants and young children, people 10 years and older, pregnant women, and people with certain health conditions or a weakened immune system are at greatest risk of flu complications.    Pneumonia, bronchitis, sinus infections, and ear infections are examples of flu-related complications. If you have a medical condition, such as heart disease, cancer, or diabetes, flu can make it worse.    Flu can cause fever and chills, sore throat, muscle aches, fatigue, cough, headache, and runny or stuffy nose. Some people may have vomiting and diarrhea, though this is more common in children than adults.     In an average year, thousands of people in the [place]  die from flu, and many more are hospitalized. Flu vaccine prevents millions of illnesses and flu-related visits to the doctor each year.    2. Influenza vaccines     CDC recommends everyone 6 months and older get vaccinated every flu season. Children 6 months through 58 years of age may need 2 doses during a single flu season. Everyone else needs only 1 dose each flu season.    It takes about 2 weeks for protection to develop after vaccination.    There are many flu viruses, and they are always changing. Each year a new flu vaccine is made to protect against the influenza viruses believed to be likely to cause disease in the upcoming flu season. Even when the vaccine doesn't  exactly match these viruses, it may still provide some protection.     Influenza vaccine does not cause flu.    Influenza vaccine may be given at the same time as other vaccines.    3. Talk with your health care provider    Tell your vaccination provider if the person getting the vaccine:   Has had an allergic reaction after a previous dose of influenza vaccine, or has any severe, life-threatening allergies    Has ever had Guillain-Barr Syndrome (also called "GBS")    In some cases, your health care provider may decide to postpone influenza vaccination until a future visit.    Influenza vaccine can be administered at any time during pregnancy. Women who are or will be pregnant during influenza season should receive inactivated influenza vaccine.    People with minor illnesses, such as a cold, may be vaccinated. People who are moderately or severely ill should usually wait until they recover before getting influenza vaccine.    Your health care provider can give you more information.    4. Risks of a vaccine reaction     Soreness, redness, and swelling where the shot is given, fever, muscle aches, and headache can happen after influenza vaccination.   There may be a very small increased risk of Guillain-Barr Syndrome (GBS) after inactivated influenza vaccine (the flu shot).    Young children who get the flu shot along with pneumococcal vaccine (PCV13) and/or DTaP vaccine at the same time might be  slightly more likely to have a seizure caused by fever. Tell your health care provider if a child who is getting flu vaccine has ever had a seizure.    People sometimes faint after medical procedures, including vaccination. Tell your provider if you feel dizzy or have vision changes or ringing in the ears.    As with any medicine, there is a very remote chance of a vaccine causing a severe allergic reaction, other serious injury, or death.    5. What if there is a serious problem?    An allergic reaction could occur after  the vaccinated person leaves the clinic. If you see signs of a severe allergic reaction (hives, swelling of the face and throat, difficulty breathing, a fast heartbeat, dizziness, or weakness), call 9-1-1 and get the person to the nearest hospital.    For other signs that concern you, call your health care provider.    Adverse reactions should be reported to the Vaccine Adverse Event Reporting System (VAERS). Your health care provider will usually file this report, or you can do it yourself. Visit the VAERS website at www.vaers.LAgents.no or call 231-148-7075. VAERS is only for reporting reactions, and VAERS staff members do not give medical advice.    6. The National Vaccine Injury Compensation Program    The [employer] Vaccine Injury Compensation Program (VICP) is a federal program that was created to compensate people who may have been injured by certain vaccines. Claims regarding alleged injury or death due to vaccination have a time limit for filing, which may be as short as two years. Visit the VICP website at SpiritualWord.at or call 660-793-5812 to learn about the program and about filing a claim.     7. How can I learn more?     Ask your health care provider.    Call your local or state health department.    Visit the website of the Food and Drug Administration (FDA) for vaccine package inserts and additional information at FinderList.no.   Contact the Centers for Disease Control and Prevention (CDC):  - Call (410) 164-6468 (1-800-CDC-INFO) or  - Visit CDC's influenza website at BiotechRoom.com.cy.    Vaccine Information Statement   Inactivated Influenza Vaccine   01/23/2024  42 [place]  [phone]     Department of Health and Insurance risk surveyor for Disease Control and Prevention    Office Use Only

## 2024-10-15 ENCOUNTER — Encounter (HOSPITAL_COMMUNITY)
Admission: RE | Disposition: A | Discharge status: Home / Self Care | Payer: Self-pay | Source: Ambulatory Visit | Attending: Emergency Medicine

## 2024-10-15 ENCOUNTER — Ambulatory Visit
Admission: RE | Admit: 2024-10-15 | Discharge: 2024-10-15 | Disposition: A | Discharge status: Home / Self Care | Source: Ambulatory Visit | Attending: Emergency Medicine | Admitting: Emergency Medicine

## 2024-10-15 ENCOUNTER — Ambulatory Visit (HOSPITAL_COMMUNITY): Admission: RE | Admit: 2024-10-15 | Discharge: 2024-10-15 | Disposition: A | Source: Ambulatory Visit

## 2024-10-15 ENCOUNTER — Other Ambulatory Visit: Payer: Self-pay

## 2024-10-15 ENCOUNTER — Other Ambulatory Visit (HOSPITAL_BASED_OUTPATIENT_CLINIC_OR_DEPARTMENT_OTHER): Payer: Self-pay | Admitting: Emergency Medicine

## 2024-10-15 ENCOUNTER — Encounter (HOSPITAL_COMMUNITY): Payer: Self-pay | Admitting: Emergency Medicine

## 2024-10-15 DIAGNOSIS — M549 Dorsalgia, unspecified: Secondary | ICD-10-CM

## 2024-10-15 DIAGNOSIS — M5416 Radiculopathy, lumbar region: Secondary | ICD-10-CM | POA: Insufficient documentation

## 2024-10-15 LAB — POC BLOOD GLUCOSE (RESULTS): GLUCOSE, POC: 119 mg/dL — ABNORMAL HIGH (ref 70–110)

## 2024-10-15 SURGERY — BLOCK INJECTION CAUDAL EPIDURAL
Anesthesia: Local (Nurse-Monitored) | Laterality: Right | Wound class: Clean Wound: Uninfected operative wounds in which no inflammation occurred

## 2024-10-15 MED ORDER — TRIAMCINOLONE ACETONIDE 80 MG/ML SUSPENSION FOR INJECTION
Freq: Once | INTRAMUSCULAR | Status: DC | PRN
Start: 2024-10-15 — End: 2024-10-15
  Administered 2024-10-15: 80 mg via INTRAMUSCULAR

## 2024-10-15 MED ORDER — IOPAMIDOL 200 MG IODINE/ML (41 %) INTRATHECAL SOLUTION
Freq: Once | INTRATHECAL | Status: DC | PRN
Start: 2024-10-15 — End: 2024-10-15
  Administered 2024-10-15: 3 mL via INTRAMUSCULAR

## 2024-10-15 MED ORDER — SODIUM CHLORIDE 0.9 % INJECTION SOLUTION
Freq: Once | INTRAMUSCULAR | Status: DC | PRN
Start: 2024-10-15 — End: 2024-10-15
  Administered 2024-10-15: 10 mL via INTRAMUSCULAR

## 2024-10-15 MED ORDER — IOPAMIDOL 200 MG IODINE/ML (41 %) INTRATHECAL SOLUTION
10.0000 mL | INTRATHECAL | Status: AC
Start: 2024-10-15 — End: 2024-10-15
  Administered 2024-10-15: 10 mL via EPIDURAL

## 2024-10-15 MED ORDER — LIDOCAINE HCL 10 MG/ML (1 %) INJECTION SOLUTION
Freq: Once | INTRAMUSCULAR | Status: DC | PRN
Start: 2024-10-15 — End: 2024-10-15
  Administered 2024-10-15: 30 mL via INTRAMUSCULAR

## 2024-10-15 SURGICAL SUPPLY — 10 items
APPL ISPRP CHG 10.5ML CHLRPRP HI-LT ORNG PREP STRL LF (MED SURG SUPPLIES) IMPLANT
BAG 36X36IN BAND EQP (DRAPE/PACKS/SHEETS/OR TOWEL) IMPLANT
COVER RIGID STRL LF  CNVRT LIGHT HNDL PLASTIC DISP GRN (MED SURG SUPPLIES) ×1 IMPLANT
DISC NO SUB - TRAY EPIDRL 28X22IN 18GA 27GA LL TUOHY 1.25IN ASST VOL OVAL 10% PVP IOD PLASTIC 1 SHOT NEEDLE STICK (MED SURG SUPPLIES) ×1 IMPLANT
DISC USE ITEM 122067 - DRAPE URO CRC APRTR ADH STRP 36X24IN LF  STRL DISP SURG 4IN 1IN (DRAPE/PACKS/SHEETS/OR TOWEL) IMPLANT
NEEDLE HYPO  30GA .5IN REG WL PRCSNGL SS POLYPROP REG BVL LL HUB DEHP-FR TAN STRL LF  DISP (MED SURG SUPPLIES) ×1 IMPLANT
NEEDLE SPINAL 3.5IN 22GA PENCAN BPA PP PVC FREE DEHP-FR STRL LF (MED SURG SUPPLIES) IMPLANT
NEEDLE SPINAL CAROLINA BLU 2.5IN 25GA QUINCKE FN METAL PLASTIC STY REM WNG STRL LF  DISP (MED SURG SUPPLIES) IMPLANT
NERVE BLOCK TRAY CSR WRP HYPO  NEEDLE MNBR EXT LINE SYRG UNIV RND LL PLASTIC 1.5IN 30IN 18GA 5ML 10 (MED SURG SUPPLIES) IMPLANT
SYRINGE LL 5ML LF  STRL LOR MED GLS DISP (MED SURG SUPPLIES) ×1 IMPLANT

## 2024-10-15 NOTE — Nurses Notes (Signed)
Hardtner Pain Rating Scale     On a scale of 0-10, during the past 24 hours, pain has interfered with you usual activity: 8     On a scale of 0-10, during the past 24 hours, pain has interfered with your sleep: 0    On a scale of 0-10, during the past 24 hours, pain has affected your mood: 8     On a scale of 0-10, during the past 24 hours, pain has contributed to your stress: 8     On a scale of 0-10, what is your overall pain Rating: 2

## 2024-10-15 NOTE — Nurses Notes (Signed)
 Meds on field, see MD notes for amounts used.

## 2024-10-15 NOTE — OR Surgeon (Signed)
 [facility] MEDICINE [facility]  [address]  [place] [facility] [address]  [phone]    PATIENT NAME: Joanna Black  CHART WLFAZM:[id number]  DATE OF BIRTH: March 25, 1946  DATE OF SERVICE:10/15/2024      PREOPERATIVE DIAGNOSIS: Problem List[1]  Lumbar Radiculopathy  POSTOPERATIVE DIAGNOSIS: SAME    Procedure: Epidural Steroid Injection Under Fluoroscopy at    L4-5    Attending: Rankin Lesches, DO    Assistant: None    Anesthesia: Local    Estimated Blood Loss: None    Complication: None      Procedure:  After informed consent was obtained, patient was taken to the fluoroscopy suite and placed in a prone position.    TIMEOUT:  Correct patient? Yes  Correct site? Yes  Correct side? Yes  Correct position? Yes  Correct procedure? Yes  Correct medication? Yes  Site marked? Yes  H&P note completed? Yes  Consents verified? Yes  Relevant lab results available? Yes  Allergies reviewed? Yes  Is all required equipment for the procedure available? Yes  Is documentation verified? Yes  Is Time Out verified by doctor and nurse? Yes    The skin was prepped and draped in the usual sterile fashion using chlorhexidine. The skin and subcutaneous tissue was infiltrated with 1% Lidocaine  using a 27 gauge 1.5 inch needle.  An 20 gauge  Touhy needle was advanced with the aid of fluoroscopy using the loss of resistance technique at the L4-5 interspinous space using an intralaminer approach.  Proper placement into the epidural space was confirmed by the loss of resistance.  2cc of Omnipaque  250 was instilled after a negative aspiration.  A good epiduragram without vascular runoff was noted. There was no CSF, heme or paresthesias.  After negative aspiration, an injectate consisting of 5mL of a mixture containing 80mg DepoMedrol(methylprednisolone )  with of 1% Lidocaine  and 2ml NS was injected.  Needle was rinsed with 1cc of NS prior to removal.  There were no complications.  Patient tolerated the procedure well, all needles were  removed intact and a sterile bandage applied to the site.     Patient was observed in recovery and discharged home under supervision with written instructions in stable condition.    Rankin Lesches, DO 10/15/2024, 12:22  Division of Pain Medicine       [1]   Patient Active Problem List  Diagnosis    Type 2 diabetes mellitus with stage 3a chronic kidney disease, without long-term current use of insulin  (CMS HCC)    Cervical pain (neck)    Thoracic back pain    Chronic bilateral low back pain    Essential hypertension    Cardiomyopathy, dilated, nonischemic (CMS HCC)    Chronic GERD    Chronic systolic congestive heart failure (CMS HCC)    BMI 35.0-35.9,adult    Mixed hyperlipidemia    CKD (chronic kidney disease) stage 3, 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/min    History of colon polyps    Colon cancer screening    Gastroesophageal reflux disease    Dysphagia    Schatzki's ring    Chronic diarrhea    Diverticulosis of colon    Hypoxia    Enteropathogenic Escherichia coli infection    Encounter for support and coordination of transition of care    Chronic anemia    Osteoporosis    IDA (iron  deficiency anemia)    Bloating    Belching

## 2024-10-15 NOTE — Discharge Instructions (Signed)
 [facility]  AT [facility] - [place], [place] [address] - Phone: [phone])      Complications (Reasons to call):  Marland Kitchen Temperature greater than 101 degrees  . Unusual redness or swelling at the injection site  . Persistent nausea or vomiting or headache  . Persistent weakness, numbness or bleeding  . Loss of bowel or bladder control  . If you are unable to reach your physician and your symptoms are severe, have yourself brought to the nearest Emergency Department or call 911    You may experience:  . You may also experience a temporary increase in the level of your pain  . You may experience weakness, tingling or heavy feelings in your legs the first few hours after your procedure, requiring you to be cautious when ambulating (walking).   . Do NOT drive a car or operate heavy machinery for 24 hours after your procedure    Medications:  Most medications held for your procedure may be resumed one day after the procedure. Please ask your physician if you have questions about specific medications or the procedure itself.    Comfort measures:  . You may use ice over the injection site  . AVOID HEAT for the first 24 hours  . After that ice or heat may be used as needed. DO NOT apply LONGER than 20 minutes and wait 20 minutes before reapplication  . Avoid sitting in a bathtub, hot tub, pool, etc. for 3 days after your procedure    Activity:  . Rest at home for the next 24 hours  . Then increase activity as tolerated  . Typically it is ok to return to work one day after the procedure      It is recommended that you do not receive any vaccinations 7 days prior to a pain clinic procedure, or 7 days after a pain clinic procedure. Side effects from the vaccine may be similar to those that you might experience as an adverse reaction to a pain clinic procedure.

## 2024-10-18 ENCOUNTER — Telehealth (HOSPITAL_BASED_OUTPATIENT_CLINIC_OR_DEPARTMENT_OTHER): Payer: Self-pay | Admitting: Emergency Medicine

## 2024-10-18 NOTE — Telephone Encounter (Signed)
 Called to see how patient was doing after their (R) L4-5 LESI injection on 10.24.25 with Dr. Court, and what percent of relief they had. No answer, left message to return our call    Leita Burkes, [place]

## 2024-11-03 ENCOUNTER — Other Ambulatory Visit (INDEPENDENT_AMBULATORY_CARE_PROVIDER_SITE_OTHER): Payer: Self-pay | Admitting: Family Medicine

## 2024-11-03 DIAGNOSIS — E1159 Type 2 diabetes mellitus with other circulatory complications: Secondary | ICD-10-CM

## 2024-11-03 DIAGNOSIS — M62838 Other muscle spasm: Secondary | ICD-10-CM

## 2024-11-09 NOTE — Progress Notes (Signed)
 PAIN MANAGEMENT, [facility] OFFICE BUILDING  [address]  [place] [address]  Operated by [facility]    Name: Joanna Black MRN:  [id number]   Date: 11/11/2024 DOB:  09-07-1946 (78 y.o.)       CC: Follow Up After Injection     Obtained history from: patient    SUBJECTIVE:  Joanna Black is a 78 y.o. female  who RETURNS to Pain Management for follow up evaluation.  Last seen 10/15/24 for L4-5 ESI per Dr. Court.  She reports 95% relief which is ongoing.  Overall, is very pleased with results of injection.  Currently rates pain 3/10 and is tolerable.  Denies interval changes in health and medications.  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  Also follows with Dr. 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  As previously documented, patient has participated in conservative treatment in the past such as physical therapy and medication management without adequate relief.    Procedures:  09/23/23 Caudal ESI with catheter- No relief  02/24/24 Right L4-5, L5-S1 RFA- 70% relief ongoing  03/23/24 Left L4-5, L5-S1 RFA- No relief  06/08/24 L4-5 ESI- 95% relief x about 3 months  10/15/24 L4-5 ESI- 95% relief ongoing    Patient's past history and current information (including medical, surgical, family, social, allergy, medication) were reviewed personally today.     REVIEW OF SYSTEMS:  Other than ROS in the HPI, all other review of systems were negative except:  bladder leakage (ongoing), bowel accidents, intermittent SOB, and DM.  Denies new numbness/weakness, saddle anesthesia.    PHYSICAL EXAMINATION:    Vitals: BP (!) 170/90 Comment: 146/69  Pulse 66   Temp 36.4 C (97.5 F)   Ht 1.651 m (5' 5)   Wt 93.3 kg (205 lb 11 oz)   SpO2 96%   BMI 34.23 kg/m .        GENERAL EXAMINATION:  resting in seated position. No acute distress.   SKIN:  warm and dry  EYES:  Conjunctiva clear  HEAD/EARS/NOSE/THROAT:   normocephalic, atraumatic  NECK:    symmetric, trachea midline  CHEST WALL AND LUNGS:   Unlabored breathing, symmetrical chest excursions.  PSYCHOLOGICAL:  Affect normal. Behavior appropriate.  NEUROLOGICAL/MUSCULOSKELETAL:  Alert and oriented x3,  memory grossly intact  Visual Inspection: No appreciable muscular atrophy  Motor is 5/5 across all joints of the BLE  Sensation: normal  Coordination: Normal  Gait:Nonantalgic with use of no assistive device      RADIOLOGICAL/ LAB FINDINGS:    Lab A1C Results:  HEMOGLOBIN A1C   Date Value Ref Range Status   08/18/2023 7.1 (H) 4.8 - 6.2 % Final   01/23/2023 6.4 (H) 4.8 - 6.2 % Final   12/10/2022 7.6  Final   03/13/2022 5.9  Final   11/29/2021 6.3 (H) 4.8 - 6.2 % Final       POCT A1C Results:      03/13/2022    11:00 AM 10/03/2022     4:00 PM 12/10/2022     9:00 AM 05/25/2024    11:00 AM   POCT A1c   Time Performed   09:27 11:40   A1C 5.9 6.3 7.6 7.2         MRI SPINE LUMBOSACRAL WO CONTRAST     Collection Time: 05/21/23  1:53 PM     Narrative     Layliana R Silvey     PROCEDURE DESCRIPTION: MRI SPINE LUMBOSACRAL WO  CONTRAST     CLINICAL INDICATION: R26.2: Ambulatory dysfunction     COMPARISON: No prior studies were compared.        FINDINGS: Multiplanar multisequence images lumbar spine obtained without IV contrast enhancement. There is grade 1 anterolisthesis L4 on L5. There is no bone edema. There is disc desiccation at multiple levels throughout the lumbar spine. Mild disc space narrowing is seen. Spinal cord is normal with conus terminating at L1.     There is mild disc bulging L1-2 but no significant spinal stenosis.     Mild disc bulging and degenerative facet arthropathy L2-3 without significant spinal stenosis.     Degenerative facet changes and mild disc bulging L3-4 with mild central canal stenosis and foraminal narrowing.     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      Degenerative facet changes L5-S1 with disc bulging more prominent towards the left side. There is a central disc protrusion or herniation superimposed. There is moderate left foraminal narrowing. There is mild central canal stenosis.     Paraspinal soft tissues appear normal.     No findings to suggest infectious or inflammatory process of the lumbar spine.        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  She is advised to call with any questions or concerns.  Also advised to call if pain returns/worsens for further injection therapy.      Follows with:       Rheumatology, Dr. Froylan, Hx; polymyalgia rheumatica and giant cell arteritis     Dr. 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    Blood thinner: None  A1c per Dr. Tracie, PCP       Consider:  Repeat L4-5 ESI (rightward) at [facility].  L4-5, L5-S1 TFESIs  SIJ injection  Return to PT    Our impression, treatment recommendations and plan from today's visit were reviewed in detail with the patient in the office. All of the patient's questions were answered.  The patient verbalized understanding and wishes to move forward with the above noted plan.  Patient was seen independently.    Ronal Buck, APRN, CNP

## 2024-11-11 ENCOUNTER — Other Ambulatory Visit: Payer: Self-pay

## 2024-11-11 ENCOUNTER — Encounter (INDEPENDENT_AMBULATORY_CARE_PROVIDER_SITE_OTHER): Payer: Self-pay | Admitting: Neurological Surgery

## 2024-11-11 ENCOUNTER — Ambulatory Visit: Payer: Self-pay | Attending: Family Medicine | Admitting: Neurological Surgery

## 2024-11-11 VITALS — BP 170/90 | HR 66 | Temp 97.5°F | Ht 65.0 in | Wt 205.7 lb

## 2024-11-11 DIAGNOSIS — M5116 Intervertebral disc disorders with radiculopathy, lumbar region: Secondary | ICD-10-CM

## 2024-11-11 DIAGNOSIS — M48061 Spinal stenosis, lumbar region without neurogenic claudication: Secondary | ICD-10-CM | POA: Insufficient documentation

## 2024-11-11 DIAGNOSIS — M4726 Other spondylosis with radiculopathy, lumbar region: Secondary | ICD-10-CM

## 2024-11-11 DIAGNOSIS — M5416 Radiculopathy, lumbar region: Secondary | ICD-10-CM | POA: Insufficient documentation

## 2024-11-11 DIAGNOSIS — M51369 Other intervertebral disc degeneration, lumbar region without mention of lumbar back pain or lower extremity pain: Secondary | ICD-10-CM | POA: Insufficient documentation

## 2024-11-11 NOTE — Nursing Note (Signed)
 Pain and Function:     [place] Pain Rating Scale     On a scale of 0-10, during the past 24 hours, pain has interfered with you usual activity:   3    On a scale of 0-10, during the past 24 hours, pain has interfered with your sleep:  0    On a scale of 0-10, during the past 24 hours, pain has affected your mood:   0    On a scale of 0-10, during the past 24 hours, pain has contributed to your stress:   0    On a scale of 0-10, what is your overall pain Rating:  3           Pain Better  Last seen on 10-15-24 for procedure    95 % of relief for n/a   Lowest level of pain 0 /10  Highest level of pain 3 /10  Darryle Molt, MA

## 2024-11-30 ENCOUNTER — Ambulatory Visit (INDEPENDENT_AMBULATORY_CARE_PROVIDER_SITE_OTHER): Payer: Self-pay | Admitting: Family Medicine

## 2024-11-30 ENCOUNTER — Other Ambulatory Visit (INDEPENDENT_AMBULATORY_CARE_PROVIDER_SITE_OTHER): Payer: Self-pay | Admitting: Student in an Organized Health Care Education/Training Program

## 2024-11-30 DIAGNOSIS — M353 Polymyalgia rheumatica: Secondary | ICD-10-CM

## 2024-11-30 MED ORDER — PREDNISONE 1 MG TABLET: 4.0000 mg | ORAL_TABLET | Freq: Every day | ORAL | 0 refills | Status: DC

## 2024-11-30 NOTE — Telephone Encounter (Signed)
 Joanna Black was seen on 09/01/2024 by Dr. Wriston for PMR.  Labs were completed on 10/09/2024  and results are listed below.  The next scheduled appointment date is 12/07/2024.  [facility] is requesting a refill on Prednisone .  Message has been routed to Dr. Froylan      Lab Results   Component Value Date/Time    WBC 14.8 (H) 10/09/2024 03:16 AM    HGB 11.6 10/09/2024 03:16 AM    HCT 37.3 10/09/2024 03:16 AM    PLTCNT 267 10/09/2024 03:16 AM    RBC 3.99 10/09/2024 03:16 AM    MCV 93.5 10/09/2024 03:16 AM    MCHC 31.1 10/09/2024 03:16 AM    MCH 29.1 10/09/2024 03:16 AM    RDW 18.6 (H) 08/18/2023 09:16 AM    MPV 11.4 10/09/2024 03:16 AM      Lab Results   Component Value Date    CREATININE 1.27 (H) 10/09/2024      Hepatic Function  Lab Results   Component Value Date    ALBUMIN 3.2 (L) 10/09/2024    TOTALPROTEIN 7.3 10/09/2024    ALKPHOS 84 10/09/2024    PROTHROMTME 13.5 (H) 10/29/2022    INR 1.19 10/29/2022    AST 18 10/09/2024    ALT 7 10/09/2024    BILIRUBINCON 0.3 10/29/2022     Ronal Amble Shakir Petrosino, LPN  [id number]/[id number],  09:20

## 2024-11-30 NOTE — Telephone Encounter (Signed)
 Called patient back to ask what she needed seen for, patient stated that she is having issues with indigestion/gastritis. It is not doing any better and wants to get it checked out, she wanted to get set up with a gastro doctor as she has a spell every 3 weeks and gets worse every time. Patient stated that she has gas stuck up under her rib cage and it causing a lot of discomfort.    Geofm Chow, [place] 11/30/2024 13:13

## 2024-11-30 NOTE — Telephone Encounter (Signed)
 Please return call to patient and see if we can get her scheduled for in office follow-up for within the next week.  Thank you.

## 2024-11-30 NOTE — Telephone Encounter (Signed)
 Patient called and left a message saying she needed to see PCP either this week or sometime soon, did not state why but will call patient to ask what she needs seen for.    Geofm Chow, [place] 11/30/2024 13:04

## 2024-12-01 NOTE — Telephone Encounter (Signed)
 Called and informed patient of doctors notes, patient voiced understanding and is scheduled for 12/09/24 per PCP request.    Geofm Chow, MA 12/01/2024 08:42

## 2024-12-07 ENCOUNTER — Ambulatory Visit
Attending: Student in an Organized Health Care Education/Training Program | Admitting: Student in an Organized Health Care Education/Training Program

## 2024-12-07 DIAGNOSIS — M353 Polymyalgia rheumatica: Secondary | ICD-10-CM

## 2024-12-07 MED ORDER — PREDNISONE 1 MG TABLET: 4.0000 mg | ORAL_TABLET | Freq: Every day | ORAL | 3 refills | Status: DC

## 2024-12-07 NOTE — Progress Notes (Signed)
 [facility]  [address]  [place] [facility] [id number]  Operated by [facility]  Telephone Visit    Name:  KARYL SHARRAR MRN: [id number]   Date:  12/07/2024 DOB: [birth year] (78 y.o.)          The patient/family initiated a request for telephone service.  Verbal consent for this service was obtained from the patient/family.  Reason for audio only:Patient preference    Last office visit in this department: 09/01/2024      Reason for call:  Follow up  Call notes:  Listed below      ICD-10-CM    1. Polymyalgia rheumatica (CMS HCC)  M35.3 predniSONE  (DELTASONE ) 1 mg Oral Tablet          LOS Determination: Medical Decision Making- Direct audio communication with patient was 8 minutes    Aayliah Rotenberry, MD      Medical conditions notable for:  -hospitalized 10/29/22-11/01/22 for acute hypoxic respiratory failure.  That have significant proximal pain and weakness in her shoulders and hips with elevated inflammatory markers.  Discharged on prednisone  50 mg daily.  -obesity BMI 36   -asthma   -coronary artery disease   -CKD stage 3   -hypertension    Polymyalgia rheumatica regimen   -prednisone  4 mg daily    Shoulders and hip symptoms worsened when she reduced dose to 3 mg daily.  Patient went back to 4 mg in his doing well.  She will have joint flares about once a month but nothing that persists.    Initial HPI listed below  She was having neck and shoulder pain. She had been getting left sided frontal headaches at that time. No jaw locking or issues with her tongue. She had some blurry vision around that time, but no vision loss. Patient is currently on prednisone  5 mg daily for PMR for around a month. Shoulders are back to her baseline. She has had issues with heartburn before but it is well controlled currently on omeprazole . She has had esophageal dilations in the past.     Denies having any shortness of breath, chest pain, abdominal pain, fevers, unintential weight loss.    All  systems reviewed and are otherwise unremarkable unless stated above.    Social Hx- no alcohol use, no smoking, resides in [place]   Family Hx- no family hx of RA or SLE    Current Outpatient Medications   Medication Sig Dispense Refill    acetaminophen  (TYLENOL ) 500 mg Oral Tablet Take 1 Tablet (500 mg total) by mouth Every night as needed Takes 2 at night      albuterol  sulfate (PROVENTIL  OR VENTOLIN  OR PROAIR ) 90 mcg/actuation Inhalation oral inhaler Take 1-2 Puffs by inhalation Every 6 hours as needed 1 Each 3    albuterol  sulfate (PROVENTIL ) 2.5 mg /3 mL (0.083 %) Inhalation nebulizer solution Take 3 mL (2.5 mg total) by nebulization Three times a day as needed for Wheezing 300 mL 5    calcium carbonate/vitamin D3 (VITAMIN D -3 ORAL) Take 2,000 Int'l Units/day by mouth Daily      ezetimibe  (ZETIA ) 10 mg Oral Tablet Take 1 Tablet (10 mg total) by mouth Every evening 90 Tablet 1    FLUoxetine  (PROZAC ) 40 mg Oral Capsule Take 1 Capsule (40 mg total) by mouth Daily 90 Capsule 1    furosemide  (LASIX ) 20 mg Oral Tablet TAKE 1 TABLET (20 MG TOTAL) BY MOUTH TWICE PER DAY AS NEEDED 180 Tablet 1  HYDROcodone -acetaminophen  (NORCO) 5-325 mg Oral Tablet Take 1 Tablet by mouth Every 6 hours as needed for Pain 10 Tablet 0    magnesium  oxide (MAG-OX) 400 mg Oral Tablet Take 1 Tablet (400 mg total) by mouth Three times a day 30 Tablet 0    metoprolol  succinate (TOPROL -XL) 25 mg Oral Tablet Sustained Release 24 hr TAKE 1 TABLET BY MOUTH EVERY NIGHT. 90 Tablet 1    naproxen  (NAPROSYN ) 500 mg Oral Tablet Take 1 Tablet (500 mg total) by mouth Twice daily with food 20 Tablet 0    omeprazole  (PRILOSEC) 40 mg Oral Capsule, Delayed Release(E.C.) Take 1 Capsule (40 mg total) by mouth Daily Indications: gastroesophageal reflux disease 90 Capsule 1    predniSONE  (DELTASONE ) 1 mg Oral Tablet Take 4 Tablets (4 mg total) by mouth Daily 120 Tablet 3    tiZANidine  (ZANAFLEX ) 4 mg Oral Tablet TAKE 1 TABLET BY MOUTH THREE TIMES A DAY AS NEEDED  FOR MUSCLE CRAMPS 270 Tablet 1    TRULICITY  0.75 mg/0.5 mL Subcutaneous Pen Injector INJECT THE CONTENTS OF ONE PEN  SUBCUTANEOUSLY WEEKLY AS  DIRECTED 6 mL 3     No current facility-administered medications for this visit.     Allergies   Allergen Reactions    Amoxicillin  Other Adverse Reaction (Add comment)     Unknown reaction    Augmentin [Amoxicillin-Pot Clavulanate] Nausea/ Vomiting    Statins-Hmg-Coa Reductase Inhibitors Myalgia     Past Medical History:   Diagnosis Date    Arthritis     Asthma     Back problem     BMI 35.0-35.9,adult 02/16/2020    CAD (coronary artery disease)     mild    Cataract     Cataracts, bilateral     CHF (congestive heart failure)     Chronic bronchitis with emphysema     Chronic low back pain     Claustrophobia     Depression     Diabetes     Diabetes mellitus, type 2     Encounter for support and coordination of transition of care 11/16/2022    Esophageal reflux     Essential hypertension 01/28/2019    H/O urinary tract infection     HH (hiatus hernia)     History of kidney disease     states, low kidney functions.    Hypercholesterolemia     Hypertension     Hypertriglyceridemia     Polymyalgia rheumatica (CMS HCC)     Type 2 diabetes mellitus          Past Surgical History:   Procedure Laterality Date    CATARACT EXTRACTION, BILATERAL Bilateral     bilateral lens implant    COLONOSCOPY      ENDOSCOPIC ULTRASOUND ESOPHGEAL  04/15/2023    ESOPHAGOGASTRODUODENOSCOPY      HX CARPAL TUNNEL RELEASE      HX CATARACT REMOVAL Right 12/30/2019    HX CATARACT REMOVAL Left 01/06/2020    HX CHOLECYSTECTOMY      HX EXPOSURE TO METAL SHAVINGS      HX HEART CATHETERIZATION      HX HYSTERECTOMY      HX OOPHORECTOMY      HX TAH AND BSO      HX TUBAL LIGATION      HX VEIN STRIPPING      Left leg       Labs/imaging:  Labs reviewed from 02/20/2024  WBC 16.2, hemoglobin 13.3, platelet count 333  Labs reviewed from 08/18/2023   WBC 9.4, hemoglobin 10.9, platelet count 424 (baseline platelet count  typically in the 200's)      ESR 15, CRP 29 (CRP 70 from 2 weeks prior)    Region BMD  (g/cm) Young Adult  T-score Age Matched  Z-score   AP Spine 1.085 -0.8 0.0   Left Hip total 0.815 -1.5 -0.5   Left Hip Femoral Neck 0.729 -2.2 -0.9     Hip total           Hip Femoral Neck           Forearm           Forearm            Note: T-Scores (Standard deviation from normal young adult mean)  Normal T-Score > -1 BMD within 1 SD of a young normal adult   Osteopenia (low bone mass) T-Score = -1.0 to -2.5 BMD is between 1 and 2.5 SD below that of a young normal adult   Osteoporosis T-Score < -2.5 BMD is 2.5 SD or more below that of a young normal adult      FRAX* Results:      10-Year Probability of Fracture   Major Osteoporotic Fracture  20.6%% Hip Fracture  5.4%%         Assessment and Plan:  78 y.o. year old female presents today for follow-up of polymyalgia rheumatica.  Patient had significantly elevated inflammatory markers last fall when she was symptomatic from scalp tenderness, headaches, shoulder and girdle stiffness which resolved with high doses of corticosteroids.  Temporal artery biopsy negative.      Patient will occasionally have significant shoulder and neck pain which lasts 2-4 days.  I do not suspect these episodes to be related to PMR as it resolves.  Would not recommend increasing corticosteroid dose based on these flares.  It was recommended for patient to continue use Tylenol  and ice.  If the symptoms do lasts for about a week then would recommend going to [facility] to have inflammatory markers checked    Recommend continuation of 4 mg prednisone  daily.  Follow up virtually 3 months.      1. Polymyalgia rheumatica (CMS HCC)  - prednisone  4 mg daily until follow up; patient had worsening symptoms which taper to 3 mg.  Tentatively plan to continue 4 mg for another 3 months and then try alternating daily doses of 4 mg and 3 mg for 2 months before attempting to wean further.    Osteoporosis  -patient  previously counseled about Prolia.  Based on her FRAX score she would benefit however [facility] as well as [facility] facilities under estimate T-score values and therefore would recommend holding on osteoporosis.  DEXA scan from 2025 stable compared to 2021.  Recommend follow up 2 years      Return in about 3 months (around 03/07/2025) for Telephone Visit.    Claudia Greenley, MD

## 2024-12-09 ENCOUNTER — Encounter (HOSPITAL_BASED_OUTPATIENT_CLINIC_OR_DEPARTMENT_OTHER): Payer: Self-pay | Admitting: Student in an Organized Health Care Education/Training Program

## 2024-12-09 ENCOUNTER — Ambulatory Visit
Payer: Medicare PPO | Attending: Student in an Organized Health Care Education/Training Program | Admitting: Student in an Organized Health Care Education/Training Program

## 2024-12-09 ENCOUNTER — Other Ambulatory Visit: Payer: Self-pay

## 2024-12-09 ENCOUNTER — Ambulatory Visit (INDEPENDENT_AMBULATORY_CARE_PROVIDER_SITE_OTHER): Payer: Self-pay | Admitting: Family Medicine

## 2024-12-09 VITALS — BP 110/60 | HR 87 | Temp 96.9°F | Resp 16 | Ht 64.0 in | Wt 199.1 lb

## 2024-12-09 DIAGNOSIS — R1012 Left upper quadrant pain: Secondary | ICD-10-CM | POA: Insufficient documentation

## 2024-12-09 DIAGNOSIS — Z860100 Personal history of colon polyps, unspecified: Secondary | ICD-10-CM | POA: Insufficient documentation

## 2024-12-09 DIAGNOSIS — K222 Esophageal obstruction: Secondary | ICD-10-CM | POA: Insufficient documentation

## 2024-12-09 DIAGNOSIS — R0789 Other chest pain: Secondary | ICD-10-CM | POA: Insufficient documentation

## 2024-12-09 DIAGNOSIS — K5730 Diverticulosis of large intestine without perforation or abscess without bleeding: Secondary | ICD-10-CM | POA: Insufficient documentation

## 2024-12-09 DIAGNOSIS — R142 Eructation: Secondary | ICD-10-CM | POA: Insufficient documentation

## 2024-12-09 DIAGNOSIS — R1310 Dysphagia, unspecified: Secondary | ICD-10-CM

## 2024-12-09 DIAGNOSIS — K219 Gastro-esophageal reflux disease without esophagitis: Secondary | ICD-10-CM | POA: Insufficient documentation

## 2024-12-09 MED ORDER — PEG 3350-ELECTROLYTES 236 GRAM-22.74 GRAM-6.74 GRAM-5.86 GRAM SOLUTION: 4.0000 L | Freq: Once | ORAL | 0 refills | Status: AC

## 2024-12-09 MED ORDER — BISACODYL 5 MG TABLET: 4.0000 | ORAL_TABLET | Freq: Once | ORAL | 0 refills | Status: AC

## 2024-12-09 NOTE — Progress Notes (Unsigned)
 [facility]                                                      Department of Gastroenterology  [address]  [place], [place] [address]    Date:   12/09/2024  Name: Joanna Black  Age: 78 y.o.    Referring Provider:    Dennison, Zane, DO  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 female with past medical history of cholecystectomy, colon polyps, chronic diarrhea, gastroesophageal reflux disease, esophageal stricture with dilatation, coronary artery disease, hypertension, hyperlipidemia, arthritis, obesity, diverticulosis      Clinic visit  04-19-2022   =================  Patient is here for new visit   Patient complaining of worsening dysphagia, she has history of esophageal stricture with previous dilatation and Joanna Black , she states that she did not have dilatation to the maximum because unavailability of larger sized balloons   She is unable to tolerate liquids or solids recently, everything gets stuck in the chest  She has history of colon polyps, colonoscopy long time ago, no report available, due for surveillance  No GI cancer in family    She has cholecystectomy before, chronic diarrhea mainly after eating, previously treated with colestipol  with partial improvement  She has heartburn, taking famotidine , her PCP switch PPI to famotidine  due to concern for side effects, she has worsening symptoms and indigestion  No NSAIDs use  No smoking or drinking    The patient denies any abdominal pain, nausea, vomiting, abdominal bloating, constipation,  rectal bleeding,  melena, hematemesis, unintentional weight loss or other complaints.    Clinic Visit 06/15/2022:  Joanna Black is here today for follow up s/p EGD as discussed below. She is doing better and currently denies any GI concerns. She still experiences postprandial dirrhea. Pt states she was on cholestyramine  which then caused constipation.  Patient stopped taking cholestyramine  due to constipation and also disliking the taste of it.  GERD is improved with famotidine .  Patient states it was better controlled with omeprazole  in the past.  She has CT AP scheduled later today to evaluate duodenal segment epithelial lesion.  This was evaluated recently by EUS which was unremarkable for any malignant cells, CT AP was then ordered.        Clinic visit   May-9-24    =================  Patient is here for follow up   Patient underwent EGD October 2023 with dilatation of Schatzki's ring up to 20 mm, dysphagia improved..    EGD October 2023 showed normal upper middle esophagus, normal lower 3rd of esophagus, regular Z-line at 34 cm no esophagitis.  Low-grade narrowing of Schatzki's ring dilated from 18 mm to 20 mm with mild resistance at 20 mm, the ring was also instructed using cold forceps cm hiatal hernia Hill grade 2.  Bile in the stomach was aspirated, normal  gastric body and antrum, normal duodenal bulb, submucosal nodule in the 2nd part of the duodenum    EUS FNA April 15, 2023 for duodenal submucosal lesion showed 18 x 14 mm submucosal lesion in the 2nd portion of duodenum this is smaller than previously measured lesion biopsies were taken    Satisfactory for evaluation.  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    Comments    Immunostains for CK AE1/AE3, smooth muscle actin, S100, CD117, DOG 1, and SOX 10 have been performed, supporting the above diagnosis.  The differential diagnosis  includes peripheral nerve sheath tumor, ganglioneuroma, and mucosal Schwann cell hamartoma.     Heartburn controlled with omeprazole   Patient has iron -deficiency anemia  Not taking iron , denies any overt GI bleeding    Would like to defer colonoscopy at this time and will discuss next visit  Diarrhea resolved  The patient denies any abdominal pain, nausea, vomiting, abdominal bloating, constipation, diarrhea, rectal bleeding, melena, hematemesis, unintentional weight loss or other complaints.        Clinic visit  December-19-24   =================  Patient is here for follow up   Heartburn controlled with omeprazole   Has iron -deficiency anemia, repeat CBC and iron  studies was prescribed oral iron  last visit  Recommend to continue oral iron  daily for another 3-6 months  Patient would like to have her colonoscopy with her local GI doctor, recommended to be completed for iron -deficiency anemia  Diarrhea resolved, has regular bowel movements  Denies current dysphagia  Patient increased her omeprazole  20 mg p.o. b.i.d. due to worsening bloating and belching, symptoms improved with PPI b.i.d. requesting refill      The patient denies any  difficulty with swallowing, abdominal pain, nausea, vomiting,  constipation, diarrhea, rectal bleeding, melena, hematemesis, unintentional weight loss or other complaints.      Clinic visit December 18th, 2025  =================  Patient is here for follow up     Taking metamucikl every night   First constiaptin othen diarrhera   Luq pain underrib caeg   No bleding   GERD improve wth iopmeprazole   Dyshpgia ok   Belching   Bloating   Taking gas x without improvement   Chjange mentamucil to benefiber      ent denies any heartburn, difficulty with swallowing, abdominal pain, nausea, vomiting, abdominal bloating, constipation, diarrhea, rectal bleeding, melena, hematemesis, unintentional weight loss or other complaints.    Review of Systems  See HPI.  No chest pain or sob  No fever or  chills     Past Medical History: She  has a past medical history of Arthritis, Asthma, Back problem, BMI 35.0-35.9,adult (02/16/2020), CAD (coronary artery disease), Cataract, Cataracts, bilateral, CHF (congestive heart failure), Chronic bronchitis with emphysema, Chronic low back pain, Claustrophobia, Depression, Diabetes, Diabetes mellitus, type 2, Encounter for support and coordination of transition of care (11/16/2022), Esophageal reflux, Essential hypertension (01/28/2019), H/O urinary tract infection, HH (hiatus hernia), History of kidney disease, Hypercholesterolemia, Hypertension, Hypertriglyceridemia, Polymyalgia rheumatica (CMS HCC), and Type 2 diabetes mellitus.    She has no past medical history of Breast CA, Cancer (CMS HCC), Cervical cancer, Colon cancer, Endometrial cancer, Ovarian cancer, or Uterine cancer.      Past Surgical History: She  has a past surgical history that includes hx hysterectomy; hx carpal tunnel release; hx tubal ligation; hx gall bladder surgery/chole; hx vein stripping; hx exposure  to metal shavings; Esophagogastroduodenoscopy; colonoscopy; hx tah and bso; hx heart catheterization; Cataract extraction, bilateral (Bilateral); hx cataract removal (Right, 12/30/2019); hx cataract removal (Left, 01/06/2020); hx oophorectomy; and endoscopic ultrasound esophgeal (04/15/2023).    Social History: Her  reports that she has never smoked. She has never been exposed to tobacco smoke. She has never used smokeless tobacco. She reports that she does not drink alcohol and does not use drugs., ,     Family History: She family history includes Bone cancer in her father; Diabetes in her multiple family members; Hypertension (High Blood Pressure) in her multiple family members; No Known Problems in her mother; Stomach Cancer in her paternal grandmother.    Allergies: She is allergic to amoxicillin, augmentin [amoxicillin-pot clavulanate], and statins-hmg-coa reductase inhibitors.    Home Medication:    Current Outpatient Medications   Medication Sig    acetaminophen  (TYLENOL ) 500 mg Oral Tablet Take 1 Tablet (500 mg total) by mouth Every night as needed Takes 2 at night    albuterol  sulfate (PROVENTIL  OR VENTOLIN  OR PROAIR ) 90 mcg/actuation Inhalation oral inhaler Take 1-2 Puffs by inhalation Every 6 hours as needed    albuterol  sulfate (PROVENTIL ) 2.5 mg /3 mL (0.083 %) Inhalation nebulizer solution Take 3 mL (2.5 mg total) by nebulization Three times a day as needed for Wheezing    calcium carbonate/vitamin D3 (VITAMIN D -3 ORAL) Take 2,000 Int'l Units/day by mouth Daily    ezetimibe  (ZETIA ) 10 mg Oral Tablet Take 1 Tablet (10 mg total) by mouth Every evening    FLUoxetine  (PROZAC ) 40 mg Oral Capsule Take 1 Capsule (40 mg total) by mouth Daily    furosemide  (LASIX ) 20 mg Oral Tablet TAKE 1 TABLET (20 MG TOTAL) BY MOUTH TWICE PER DAY AS NEEDED    HYDROcodone -acetaminophen  (NORCO) 5-325 mg Oral Tablet Take 1 Tablet by mouth Every 6 hours as needed for Pain (Patient not taking: Reported on 12/09/2024)    magnesium  oxide (MAG-OX) 400 mg Oral Tablet Take 1 Tablet (400 mg total) by mouth Three times a day (Patient not taking: Reported on 12/09/2024)    metoprolol  succinate (TOPROL -XL) 25 mg Oral Tablet Sustained Release 24 hr TAKE 1 TABLET BY MOUTH EVERY NIGHT.    naproxen  (NAPROSYN ) 500 mg Oral Tablet Take 1 Tablet (500 mg total) by mouth Twice daily with food    omeprazole  (PRILOSEC) 40 mg Oral Capsule, Delayed Release(E.C.) Take 1 Capsule (40 mg total) by mouth Daily Indications: gastroesophageal reflux disease    predniSONE  (DELTASONE ) 1 mg Oral Tablet Take 4 Tablets (4 mg total) by mouth Daily    tiZANidine  (ZANAFLEX ) 4 mg Oral Tablet TAKE 1 TABLET BY MOUTH THREE TIMES A DAY AS NEEDED FOR MUSCLE CRAMPS    TRULICITY  0.75 mg/0.5 mL Subcutaneous Pen Injector INJECT THE CONTENTS OF ONE PEN  SUBCUTANEOUSLY WEEKLY AS  DIRECTED       Examination:  There were no vitals taken for this visit.       Wt Readings from Last 3  Encounters:   11/11/24 93.3 kg (205 lb 11 oz)   10/15/24 92.5 kg (204 lb)   10/13/24 92.5 kg (204 lb)       General: Resting comfortably, no acute distress.  Eyes: Conjunctiva normal, sclera non-icteric.  HENT: Atraumatic and normocephalic.   Neck:  Supple.   Lungs: Breathing comfortably.  No respiratory distress.  Abdomen: soft lax, not tender   Extremities: No cyanosis or edema  Neurologic: Alert, Oriented, Grossly normal  Psychiatric: Appears normal  Data/Chart/Labs/Imaging reviewed:  -CBC in April 2023 with WBC 9.2, hemoglobin 13.1, platelet 219  -BMP in April 2023 with creatinine 1.44, otherwise unremarkable  -liver panel in December 2022 normal    -vitamin-D in April 2023 was 41.5  -A1c in March 2023 was 5.9  -EGD in 2020 showed esophageal stricture, dilated distal middle and proximal esophagus, gastritis, lipoma of the 2nd part of duodenum, no pathology available, report not mentionening  the size of dilatation  -colonoscopy in 2018 with diverticulosis, biopsy taken to rule out microscopic colitis, no pathology available    -CT abdomen pelvis November 2023 with IV contrast showed no acute findings.      Fluoro esophagram barium swallow 04/25/2022:  Small hiatal hernia, with Schatzki ring measuring 1.0 cm in diameter.    EUS 05/10/2022:  Hypoechoic lesion 2nd portion of the duodenum.  This is in the submucosa, but, focally there is invasion of the muscularis propriae.  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  Biopsied.  Pathology:  Duodenal nodule.  Satisfactory for evaluation.  Negative for malignant cells.    EGD on 06/05/2022:  Normal upper 3rd of esophagus.  Normal middle 3rd of esophagus.  Normal lower 3rd of esophagus.  Low-grade or narrowing Schatzki ring.  Dilated from 18-19 mm with significant resistance at 19 mm.  Z-line regular, 34 cm from incisors.  No esophagitis.  3 cm hiatal hernia.  Erythematous mucosa in the antrum.  No specimens collected.  Hill grade 2 hiatal hernia.  Normal duodenal bulb.  Duodenal subepithelial  lesion.  This was evaluated by EUS pending CT abdomen scheduled 06/18/2022.    EGD October 2023 showed normal upper middle esophagus, normal lower 3rd of esophagus, regular Z-line at 34 cm no esophagitis.  Low-grade narrowing of Schatzki's ring dilated from 18 mm to 20 mm with mild resistance at 20 mm, the ring was also instructed using cold forceps cm hiatal hernia Hill grade 2.  Bile in the stomach was aspirated, normal gastric body and antrum, normal duodenal bulb, submucosal nodule in the 2nd part of the duodenum    EUS FNA April 15, 2023 for duodenal submucosal lesion showed 18 x 14 mm submucosal lesion in the 2nd portion of duodenum this is smaller than previously measured lesion biopsies were taken    Satisfactory for evaluation.  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    Comments    Immunostains for CK AE1/AE3, smooth muscle actin, S100, CD117, DOG 1, and SOX 10 have been performed, supporting the above diagnosis.  The differential diagnosis includes peripheral nerve sheath tumor, ganglioneuroma, and mucosal Schwann cell hamartoma.           _________________________________________________________________________  Assessment and Plan  Joanna Black is a 78 y.o. female with the following issues:    Dysphagia to solid and liquid improved s/p dilation. EGD on 05/2022 showed Schatzki ring.  Repeated EGD October 2023 with dilatation of Schatzki's ring from 18 mm to 20 mm, patient denies current dysphagia  3 cm hiatal hernia, Hill grade  Patient has recent worsening bloating and belching likely secondary to hiatal hernia, symptoms improved by increasing omeprazole  20 mg p.o. b.i.d.  Gastroesophageal reflux disease without esophagitis currently controlled with omeprazole  20 mg p.o. b.i.d.  Chronic iron -deficiency anemia, denies any overt GI bleeding celiac disease screening  unremarkable, denies any NSAIDs use  History of colon polyps long time ago around  7 years ago, due for surveillance.   History of postprandial diarrhea, history of cholecystectomy, possible bile salt diarrhea, partial improvement with colestipol .  Patient stopped taking cholestyramine  due to constipation and the taste of the medication.  Currently denies any diarrhea, taking Metamucil b.i.d.  Colonic diverticulosis without complication  Medium-sized subepithelial lesion with no bleeding was found in the 2nd portion of the duodenum.  This was recently evaluated by EUS which was negative for malignant cells.    CT abdomen pelvis November 2023 showed no acute findings.  Repeated EUS April 2024 showed 18 x 14 mm submucosal lesions FNA showed hamartomatous neural origin tumor, benign    My recommendations are as follows:    -recommended patient to complete colonoscopy for iron -deficiency anemia and history of colon polyps, she would like to have her colonoscopy with local GI doctor as she lives 2-3 hours from here, recommended to be completed  -will continue oral iron  daily for 3 months  -check CBC, iron  studies, ordered  -unremarkable B12 and folic acid  May 2024  -CT abdomen pelvis November 2023 was unremarkable for acute findings  -celiac disease screening unremarkable  -patient currently not taking cholestyramine  or colestipol , denies any diarrhea  --Avoid NSAIDs  -Anti-reflux measures discussed with patient.   -Avoid triggering foods  -Elevate head of bed at night or use more pillows  -Avoid eating 2-3 hours prior to bedtime   -continue omeprazole  20 mg p.o. once to twice daily.  Take it 30 minutes before breakfast.  -Recommend weight loss of at least 10 % of current body weight   -Small bites, Small sips,  Swallow each bite/sip before taking next swallow and Remain upright after meals for  30 minutes after eating, slow rate of intake. Recommend to take his medication with  puree or pudding consistency if having  difficulty swallowing medications   -Recommended to eat a high-fiber diet with fresh fruits and vegetables.  Suggested to increase fluid intake and to drink more water  when eating more fiber.  Also suggested regular exercise.  -recommend Metamucil BID  -will give refill for omeprazole     The patient was given the opportunity to ask questions and those questions were appropriately answered. The patient agreed with the treatment plan and is encouraged to call with any additional questions or concerns.       Return to the clinic in 1 year or sooner as needed        Camille Hobby, MD, MPH   [facility] - Gastroenterology  [place], [place]  [address]          Note: A portion of this documentation was generated using MMODAL (voice recognition software) and may contain syntax/voice recognition errors.

## 2024-12-11 ENCOUNTER — Encounter (HOSPITAL_BASED_OUTPATIENT_CLINIC_OR_DEPARTMENT_OTHER): Payer: Self-pay | Admitting: Student in an Organized Health Care Education/Training Program

## 2024-12-12 DIAGNOSIS — R1012 Left upper quadrant pain: Secondary | ICD-10-CM | POA: Insufficient documentation

## 2024-12-14 ENCOUNTER — Other Ambulatory Visit (HOSPITAL_BASED_OUTPATIENT_CLINIC_OR_DEPARTMENT_OTHER): Payer: Self-pay | Admitting: Student in an Organized Health Care Education/Training Program

## 2024-12-14 DIAGNOSIS — M940 Chondrocostal junction syndrome [Tietze]: Secondary | ICD-10-CM

## 2024-12-14 DIAGNOSIS — R0789 Other chest pain: Secondary | ICD-10-CM

## 2024-12-24 ENCOUNTER — Other Ambulatory Visit (INDEPENDENT_AMBULATORY_CARE_PROVIDER_SITE_OTHER): Payer: Self-pay | Admitting: Family Medicine

## 2024-12-24 DIAGNOSIS — I152 Hypertension secondary to endocrine disorders: Secondary | ICD-10-CM

## 2024-12-24 NOTE — Progress Notes (Signed)
 FAMILY MEDICINE, [facility] BUILDING  [address] [place] [address]  Operated by [facility]  Medicare Annual Wellness Visit    Name: Joanna Black MRN:  [id number]   Date: 12/27/2024 Age: 79 y.o.       SUBJECTIVE:   Joanna Black is a 79 y.o. female for presenting for Medicare Wellness exam.   I have reviewed and reconciled the medication list with the patient today.    Comprehensive Health Assessment:  Patient declines to answer health assessment questionnaire  Patient declines verbal completion of health assessment questionnaire.    I have reviewed and updated as appropriate the past medical, family and social history. 12/27/2024 as summarized below:  Past Medical History:   Diagnosis Date    Arthritis     Asthma     Back problem     BMI 35.0-35.9,adult 02/16/2020    CAD (coronary artery disease)     mild    Cataract     Cataracts, bilateral     CHF (congestive heart failure)     Chronic bronchitis with emphysema     Chronic low back pain     Claustrophobia     Depression     Diabetes     Diabetes mellitus, type 2     Encounter for support and coordination of transition of care 11/16/2022    Esophageal reflux     Essential hypertension 01/28/2019    H/O urinary tract infection     HH (hiatus hernia)     History of kidney disease     states, low kidney functions.    Hypercholesterolemia     Hypertension     Hypertriglyceridemia     Polymyalgia rheumatica (CMS HCC)     Type 2 diabetes mellitus      Past Surgical History:   Procedure Laterality Date    Cataract extraction, bilateral Bilateral     Colonoscopy      Endoscopic ultrasound esophgeal  04/15/2023    Esophagogastroduodenoscopy      Hx carpal tunnel release      Hx cataract removal Right 12/30/2019    Hx cataract removal Left 01/06/2020    Hx cholecystectomy      Hx exposure to metal shavings      Hx heart catheterization      Hx hysterectomy      Hx oophorectomy      Hx tah and bso      Hx tubal ligation      Hx vein stripping        Current Outpatient Medications   Medication Sig    acetaminophen  (TYLENOL ) 500 mg Oral Tablet Take 1 Tablet (500 mg total) by mouth Every night as needed Takes 2 at night    albuterol  sulfate (PROVENTIL  OR VENTOLIN  OR PROAIR ) 90 mcg/actuation Inhalation oral inhaler Take 1-2 Puffs by inhalation Every 6 hours as needed    albuterol  sulfate (PROVENTIL ) 2.5 mg /3 mL (0.083 %) Inhalation nebulizer solution Take 3 mL (2.5 mg total) by nebulization Three times a day as needed for Wheezing    Bisacodyl  5 mg Oral Tablet Take 4 Tablets (20 mg total) by mouth One time for 1 dose For colonoscopy as instructed    calcium carbonate/vitamin D3 (VITAMIN D -3 ORAL) Take 2,000 Int'l Units/day by mouth Daily    ezetimibe  (ZETIA ) 10 mg Oral Tablet Take 1 Tablet (10 mg total) by mouth Every evening    FLUoxetine  (PROZAC ) 40 mg Oral Capsule Take  1 Capsule (40 mg total) by mouth Daily    furosemide  (LASIX ) 20 mg Oral Tablet TAKE 1 TABLET (20 MG TOTAL) BY MOUTH TWICE PER DAY AS NEEDED    metoprolol  succinate (TOPROL -XL) 25 mg Oral Tablet Sustained Release 24 hr TAKE 1 TABLET BY MOUTH EVERY NIGHT.    naproxen  (NAPROSYN ) 500 mg Oral Tablet Take 1 Tablet (500 mg total) by mouth Twice daily with food    omeprazole  (PRILOSEC) 40 mg Oral Capsule, Delayed Release(E.C.) Take 1 Capsule (40 mg total) by mouth Daily Indications: gastroesophageal reflux disease    PEG 3350 -Electrolytes 236-22.74-6.74 -5.86 gram Oral Recon Soln Take 4,000 mL by mouth One time for 1 dose For Colonoscopy as instructed.    predniSONE  (DELTASONE ) 1 mg Oral Tablet Take 4 Tablets (4 mg total) by mouth Daily    tiZANidine  (ZANAFLEX ) 4 mg Oral Tablet TAKE 1 TABLET BY MOUTH THREE TIMES A DAY AS NEEDED FOR MUSCLE CRAMPS    TRULICITY  0.75 mg/0.5 mL Subcutaneous Pen Injector INJECT THE CONTENTS OF ONE PEN  SUBCUTANEOUSLY WEEKLY AS  DIRECTED     Family Medical History:       Problem Relation (Age of Onset)    Bone cancer Father    Diabetes Multiple family members     Hypertension (High Blood Pressure) Multiple family members    No Known Problems Mother    Stomach Cancer Paternal Grandmother            Social History     Socioeconomic History    Marital status: Married     Spouse name: Joanna Black    Number of children: 3    Years of education: 13    Highest education level: High school graduate   Occupational History    Occupation: Retired   Tobacco Use    Smoking status: Never     Passive exposure: Never    Smokeless tobacco: Never   Vaping Use    Vaping status: Never Used   Substance and Sexual Activity    Alcohol use: No     Alcohol/week: 0.0 standard drinks of alcohol    Drug use: No    Sexual activity: Not Currently     Partners: Male   Social History Narrative    MARRIED.  Lives in single story home.  Heats home with electric and has well water .        April Haney, LPN  7/75/7978, 14:01     Social Determinants of Health     Financial Resource Strain: Low Risk (12/27/2024)    Financial Resource Strain     SDOH Financial: No   Transportation Needs: Low Risk (12/27/2024)    Transportation Needs     SDOH Transportation: No   Social Connections: Low Risk (12/27/2024)    Social Connections     SDOH Social Isolation: 5 or more times a week   Intimate Partner Violence: Low Risk (12/27/2024)    Intimate Partner Violence     SDOH Domestic Violence: No   Housing Stability: Low Risk (12/27/2024)    Housing Stability     SDOH Housing Situation: I have housing.     SDOH Housing Worry: No   Health Literacy: Medium Risk (12/27/2024)    Health Literacy     SDOH Health Literacy: Occasionally   Employment Status: Low Risk (12/27/2024)    Employment Status     SDOH Employment: Otherwise unemployed but not seeking work (ex. consulting civil engineer, retired, disabled, unpaid primary care giver)  List of Current Health Care Providers   Care Team       PCP       Name Type Specialty Phone Number    Laylani Pudwill, [facility] [phone]              Care Team       No care team found                       Health Maintenance   Topic Date Due    Tetanus-Diptheria Vaccines (1 - Tdap) Never done    RSV Adult 60+ or Pregnancy (1 - 1-dose 75+ series) Never done    Diabetic Retinal Exam  10/25/2020    Covid-19 Vaccine (Shared decision making) (5 - 2025-26 season) 08/23/2024    Diabetic A1C  11/24/2024    Diabetic Kidney Health Microalbumin/Cr Ratio  12/23/2024    Diabetic Kidney Health eGFR  10/09/2025    Osteoporosis screening  09/22/2026    Hepatitis C screening  Completed    Influenza Vaccine  Completed    Shingles Vaccine  Completed    Medicare Annual Wellness Visit - Calendar Year Insurers  Completed    Pneumococcal Vaccination, Age 51+  Completed     Medicare Wellness Assessment   Medicare initial or wellness physical in the last year?: No  Advance Directives   Does patient have a living will or MPOA: No           Advance directive information given to the patient today?: Patient Declined      Activities of Daily Living   Do you need help with dressing, bathing, or walking?: No   Do you need help with shopping, housekeeping, medications, or finances?: Yes   Do you have rugs in hallways, broken steps, or poor lighting?: No   Do you have grab bars in your bathroom, non-slip strips in your tub, and hand rails on your stairs?: Yes   Cognitive Function Screen (1=Yes, 0=No)   What is you age?: Correct   What is the time to the nearest hour?: Correct   What is the year?: Correct   What is the name of this clinic?: Correct   Can the patient recognize two persons (the doctor, the nurse, home help, etc.)?: Correct   What is the date of your birth? (day and month sufficient) : Correct   In what year did World War II end?: Incorrect   Who is the current president of the [place] ?: Correct   Count from 20 down to 1?: Correct   What address did I give you earlier?: Incorrect   Total Score: 8   Interpretation of Total Score: Greater than 6 Normal   Fall Risk Screen   Do you feel unsteady when standing or walking?: Yes  Do  you worry about falling?: Yes  Have you fallen in the past year?: Yes  How many times have you fallen?: 2 or more times  Were you ever injured from falling?: Yes  Timed up and go test (in seconds): 18   Depression Screen     Little interest or pleasure in doing things.: Not at all  Feeling down, depressed, or hopeless: Not at all  PHQ 2 Total: 0     Pain Score   Pain Score:   5    Substance Use-Abuse Screening     Tobacco Use     In Past 12 MONTHS, how often have you used any  tobacco product (for example, cigarettes, e-cigarettes, cigars, pipes, or smokeless tobacco)?: Never     Alcohol use     In the PAST 12 MONTHS, how often have you had 5 (men)/4 (women) or more drinks containing alcohol in one day?: Never     Prescription Drug Use     In the PAST 12 months, how often have you used any prescription medications just for the feeling, more than prescribed, or that were not prescribed for you? Prescriptions may include: opioids, benzodiazepines, medications for ADHD: Never           Illicit Drug Use   In the PAST 12 MONTHS, how often have you used any drugs, including marijuana, cocaine or crack, heroin, methamphetamine, hallucinogens, ecstasy/MDMA?: Never            Urine Incontinence Screen   Urinary Incontinence Screen  Do you ever leak urine when you don't want to?: YES                        OBJECTIVE:   BP 132/80 (Site: Left Arm, Patient Position: Sitting, Cuff Size: Adult)   Pulse 74   Temp 36.1 C (97 F) (Temporal)   Resp 18   Ht 1.626 m (5' 4)   Wt 89.5 kg (197 lb 6.4 oz)   SpO2 94%   BMI 33.88 kg/m        Physical Exam  Vitals reviewed.   Constitutional:       General: She is not in acute distress.     Appearance: Normal appearance. She is obese. She is not ill-appearing, toxic-appearing or diaphoretic.   HENT:      Head: Normocephalic and atraumatic.      Nose: No congestion or rhinorrhea.   Eyes:      General: No scleral icterus.        Right eye: No discharge.         Left eye: No discharge.       Conjunctiva/sclera: Conjunctivae normal.   Cardiovascular:      Rate and Rhythm: Normal rate and regular rhythm.      Pulses: Normal pulses.   Pulmonary:      Effort: Pulmonary effort is normal. No respiratory distress.      Breath sounds: No wheezing, rhonchi or rales.   Musculoskeletal:         General: No swelling or tenderness. Normal range of motion.      Cervical back: Normal range of motion.      Right lower leg: No edema.      Left lower leg: No edema.   Skin:     General: Skin is warm and dry.      Coloration: Skin is not jaundiced or pale.      Findings: No bruising, erythema, lesion or rash.   Neurological:      General: No focal deficit present.      Mental Status: She is alert. Mental status is at baseline.      Cranial Nerves: No cranial nerve deficit.      Motor: No weakness.      Gait: Gait normal.   Psychiatric:         Mood and Affect: Mood normal.         Behavior: Behavior normal.         Thought Content: Thought content normal.         Judgment: Judgment normal.  Health Maintenance Due   Topic Date Due    Tetanus-Diptheria Vaccines (1 - Tdap) Never done    RSV Adult 60+ or Pregnancy (1 - 1-dose 75+ series) Never done    Diabetic Retinal Exam  10/25/2020    Covid-19 Vaccine (Shared decision making) (5 - 2025-26 season) 08/23/2024    Diabetic A1C  11/24/2024    Diabetic Kidney Health Microalbumin/Cr Ratio  12/23/2024        ASSESSMENT & PLAN:     1. Encounter for Medicare annual wellness exam (Primary)  - Medicare Wellness Template Complete   - Health Risk Assessment Performed and Reviewed  - Advanced Directives Discussed and Reviewed with patient   - Health Maintenance Forecast given to the patient within instruction section     2. Type 2 diabetes mellitus treated without insulin  (CMS HCC)  Diabetes Monitors  A1C: 7.2  A1C Date: 05/25/2024  Kidney Health:   Urine Microalbumin/Cr Ratio--5.5 Ur Microalb/Cr Ratio date--05/05/2024   eGFR --43 eGFR date--10/09/2024    Last Lipid Panel  (Last  result in the past 2 years)        Cholesterol   HDL   LDL   Direct LDL   Triglycerides      05/05/24 1016 211   46   124  Comment: LDL is calculated using Windell Graft developed at [facility] Murleen HERO, et al. JAMA Cardiol. 2020. PMID: 67898740).    <100 mg/dL, Optimal  899-870 mg/dL, Near/Above Optimal  869-840 mg/dL, Borderline High  839-810 mg/dL, High  >=809 mg/dL, Very high     768            Retinal Exam Date: Not Found    Last Foot Exam: Not Found  Last A1c controlled at 7.2   Continue Trulicity  at 0.75 mg under the skin once weekly  - CBC/DIFF; Future  - THYROID  STIMULATING HORMONE (SENSITIVE TSH); Future  - Comp Metabolic Panel-Non Fasting; Future  - Hemoglobin A1C; Future  - Urine Microalbumin/Creatinine Ratio, Random; Future    3. 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 nightly along with Lasix  20 mg p.o. b.i.d. PRN    4. Hyperlipidemia associated with type 2 diabetes mellitus (CMS HCC)  (CMS HCC)  Lab Results   Component Value Date    CHOLESTEROL 211 (H) 05/05/2024    HDLCHOL 46 (L) 05/05/2024    LDLCHOL 124 (H) 05/05/2024    TRIG 231 (H) 05/05/2024   Continue Zetia  10 mg p.o. once every evening  - Lipid Panel; Future    5. Obesity (BMI 30.0-34.9)  BMI 33.88 kg/m2        Identified Risk Factors/ Recommended Actions             Patient declined Advanced Directives information.         Orders Placed This Encounter    CBC/DIFF    THYROID  STIMULATING HORMONE (SENSITIVE TSH)    Comp Metabolic Panel-Non Fasting    Lipid Panel    Hemoglobin A1C    Urine Microalbumin/Creatinine Ratio, Random          The patient has been educated about risk factors and recommended preventive care. Written Prevention Plan completed/ updated and given to  patient (see After Visit Summary).  Medication reconciliation was performed during today's encounter.     Return in about 4 months (around 04/26/2025).      Bessye Stith, DO  FAMILY MEDICINE, [facility] BUILDING  [address] [place] [address]  Phone: [phone]  Fax: [phone]

## 2024-12-27 ENCOUNTER — Encounter (INDEPENDENT_AMBULATORY_CARE_PROVIDER_SITE_OTHER): Payer: Self-pay | Admitting: Family Medicine

## 2024-12-27 ENCOUNTER — Ambulatory Visit: Payer: Self-pay | Attending: Family Medicine | Admitting: Family Medicine

## 2024-12-27 ENCOUNTER — Other Ambulatory Visit: Payer: Self-pay

## 2024-12-27 VITALS — BP 132/80 | HR 74 | Temp 97.0°F | Resp 18 | Ht 64.0 in | Wt 197.4 lb

## 2024-12-27 DIAGNOSIS — Z0000 Encounter for general adult medical examination without abnormal findings: Secondary | ICD-10-CM | POA: Insufficient documentation

## 2024-12-27 DIAGNOSIS — E119 Type 2 diabetes mellitus without complications: Secondary | ICD-10-CM | POA: Insufficient documentation

## 2024-12-27 DIAGNOSIS — E1169 Type 2 diabetes mellitus with other specified complication: Secondary | ICD-10-CM | POA: Insufficient documentation

## 2024-12-27 DIAGNOSIS — E785 Hyperlipidemia, unspecified: Secondary | ICD-10-CM | POA: Insufficient documentation

## 2024-12-27 DIAGNOSIS — I152 Hypertension secondary to endocrine disorders: Secondary | ICD-10-CM | POA: Insufficient documentation

## 2024-12-27 DIAGNOSIS — N183 Chronic kidney disease, stage 3 unspecified: Secondary | ICD-10-CM | POA: Insufficient documentation

## 2024-12-27 DIAGNOSIS — E66811 Obesity, class 1: Secondary | ICD-10-CM | POA: Insufficient documentation

## 2024-12-27 DIAGNOSIS — E1159 Type 2 diabetes mellitus with other circulatory complications: Secondary | ICD-10-CM | POA: Insufficient documentation

## 2024-12-27 NOTE — Patient Instructions (Signed)
 Medicare Preventive Services  Medicare coverage information Recommendation for YOU   Heart Disease and Diabetes   Lipid profile Every 5 years or more often if at risk for cardiovascular disease     Lab Results   Component Value Date    CHOLESTEROL 211 (H) 05/05/2024    HDLCHOL 46 (L) 05/05/2024    LDLCHOL 124 (H) 05/05/2024    TRIG 231 (H) 05/05/2024         Diabetes Screening    Yearly for those at risk for diabetes, 2 tests per year for those with prediabetes Last Glucose: 213    Diabetes Self Management Training or Medical Nutrition Therapy  For those with diabetes, up to 10 hrs initial training within a year, subsequent years up to 2 hrs of follow up training Optional for those with diabetes     Medical Nutrition Therapy  Three hours of one-on-one counseling in first year, two hours in subsequent years Optional for those with diabetes, kidney disease   Intensive Behavioral Therapy for Obesity  Face-to-face counseling, first month every week, month 2-6 every other week, month 7-12 every month if continued progress is documented Optional for those with Body Mass Index 30 or higher  Your Body mass index is 33.88 kg/m.   Tobacco Cessation (Quitting) Counseling   Covers up to 8 smoking and tobacco-use cessation counseling sessions in a 62-month period.    Optional for those that use tobacco   Cancer Screening Last Completion Date   Colorectal screening   For anyone age 33 to 58 or any age if high risk:  Screening Colonoscopy every 10 yrs if low risk,  more frequent if higher risk  OR  Cologuard Stool DNA test once every 3 years OR  Fecal Occult Blood Testing yearly OR  Flexible  Sigmoidoscopy  every 5 yr OR  CT Colonography every 5 yrs      See below for due date if applicable.   Screening Pap Test   Recommended every 3 years for all women age 50 to 8, or every five years if combined with HPV test (routine screening not needed after total hysterectomy).  Medicare covers every 2 years or yearly if high  risk.  Screening Pelvic Exam   Medicare covers every 2 years, yearly if high risk or childbearing age with abnormal Pap in last 3 yrs.     See below for due date if applicable.   Screening Mammogram   Recommended every 2 years for women age 59 to 45, or more frequent if you have a higher risk. Selectively recommended for women between 40-49 based on shared decisions about risk. Covered by Medicare up to every year for women age 40 or older   See below for due date if applicable.         Lung Cancer Screening  Annual low dose computed tomography (LDCT scan) is recommended for those age 81-80 who smoked 20 pack-years and are current smokers or quit smoking within past 15 years, after counseling by your doctor or nurse clinician about the possible benefits or harms.     See below for due date if applicable.   Vaccinations   Respiratory syncytial virus (RSV)  Age 48 years or older: Based on shared clinical decision-making with your provider.  Pneumococcal Vaccine  Recommended routinely age 25+ with one or two separate vaccines based on your risk. Recommended before age 59 if medical conditions with increased risk  Seasonal Influenza Vaccine  Once every flu season  Hepatitis B Vaccine  3 doses if risk (including anyone with diabetes or liver disease)  Shingles Vaccine  Two doses at age 47 or older  Diphtheria Tetanus Pertussis Vaccine  ONCE as adult, booster every 10 years     Immunization History   Administered Date(s) Administered    Covid-19 Vaccine,Moderna,12 Years+ 01/20/2020, 02/17/2020, 11/01/2020    Covid-19 Vaccine,Pfizer Bivalent,47mcg/0.3ml,12 yrs+ 11/14/2021    FLUZONE HD VACCINE (ADMIN) 09/25/2020    High-Dose Influenza Vaccine, 65+ (FLUZONE HD) 11/25/2017, 10/26/2018, 09/25/2020, 11/14/2021, 12/02/2023    INFLUENZA VIRUS VACCINE (ADMIN) 10/20/2015    Influenza Vaccine, 6 month-adult 09/09/2008    Influenza Vaccine, 65+ (FLUAD ) 09/07/2019, 10/03/2022, 10/13/2024    PREVNAR 13 01/06/2013    Pneumovax  01/06/2014    Shingrix - Zoster Vaccine 08/18/2023, 01/31/2024    ZOSTAVAX (VARICELLA ZOSTER VACCINE) 10/21/2013    Zoster Family 10/21/2013     Shingles vaccine and Diphtheria Tetanus Pertussis vaccines are available at pharmacies or local health department without a prescription.   Other Preventative Screening  Last Completion Date   Bone Densitometry   Screening: All females ages 34 and older every 10 years if initial screening normal. Postmenopausal women ages 81-64 need screening with one or more risk factor: previous fracture, parental hip fracture, current smoker, low body weight, excessive alcohol use, Rheumatoid Arthritis   For women with diagnosed Osteoporosis, follow up is recommended every 2 years or a frequency recommended by your provider.     --09/22/2024  See below for due date if applicable.     Glaucoma Screening   Yearly if in high risk group such as diabetes, family history, African American age 53+ or Hispanic American age 22+   See your eye care provider for screening.   Hepatitis C Screening   Recommended  for those born between ages 18-79 years.   --09/17/2023  See below for due date if applicable.     HIV Testing  Recommended routinely at least ONCE, covered every year for age 29 to 89 regardless of risk, and every year for age over 79 who ask for the test or higher risk. Yearly or up to 3 times in pregnancy         See below for due date if applicable.   Abdominal Aortic Aneurysm Screening Ultrasound   Once with a family history of abdominal aortic aneurysms OR a female between 6-75 and have smoked at least 100 cigarettes in your lifetime.         See below for due date if applicable.       Your Personalized Schedule for Preventive Tests     Health Maintenance: Pending and Last Completed         Date Due Completion Date    Tetanus-Diptheria Vaccines (1 - Tdap) Never done ---    RSV Adult 60+ or Pregnancy (1 - 1-dose 75+ series) Never done ---    Diabetic Retinal Exam 10/25/2020 10/26/2019     Covid-19 Vaccine (Shared decision making) (5 - 2025-26 season) 08/23/2024 11/14/2021    Diabetic A1C 11/24/2024 05/25/2024    Diabetic Kidney Health Microalbumin/Cr Ratio 12/23/2024 05/05/2024    Diabetic Kidney Health eGFR 10/09/2025 10/09/2024    Osteoporosis screening 09/22/2026 09/22/2024                  For Information on Advanced Directives for Health Care:  [place]:  [email or web address]  [place], [place], [place], [place] General Information: [email or web address]

## 2024-12-27 NOTE — Progress Notes (Signed)
 I discussed they need the following vaccines which we do not provide at our clinic but they can get a the local pharmacy.  Rsv, tdap and covid 19

## 2024-12-27 NOTE — Nursing Note (Signed)
 12/27/24 1300   Medicare Wellness Assessment   Medicare initial or wellness physical in the last year? No   Advance Directives   Does patient have a living will or MPOA No   Advance directive information given to the patient today? Patient Declined   Activities of Daily Living   Do you need help with dressing, bathing, or walking? No   Do you need help with shopping, housekeeping, medications, or finances? Yes   Do you have rugs in hallways, broken steps, or poor lighting? No   Do you have grab bars in your bathroom, non-slip strips in your tub, and hand rails on your stairs? Yes   Cognitive Function Screen   What is you age? 1   What is the time to the nearest hour? 1   What is the year? 1   What is the name of this clinic? 1   Can the patient recognize two persons (the doctor, the nurse, home help, etc.)? 1   What is the date of your birth? (day and month sufficient)  1   In what year did World War II end? 0   Who is the current president of the [place] ? 1   Count from 20 down to 1? 1   What address did I give you earlier? 0   Total Score 8   Interpretation of Total Score Greater than 6 Normal   Depression Screen   Little interest or pleasure in doing things. 0   Feeling down, depressed, or hopeless 0   PHQ 2 Total 0   Pain Score   Pain Score Five   Substance Use Screening   In Past 12 MONTHS, how often have you used any tobacco product (for example, cigarettes, e-cigarettes, cigars, pipes, or smokeless tobacco)? Never   In the PAST 12 MONTHS, how often have you had 5 (men)/4 (women) or more drinks containing alcohol in one day? Never   In the PAST 12 months, how often have you used any prescription medications just for the feeling, more than prescribed, or that were not prescribed for you? Prescriptions may include: opioids, benzodiazepines, medications for ADHD Never   In the PAST 12 MONTHS, how often have you used any drugs, including marijuana, cocaine or crack, heroin, methamphetamine, hallucinogens,  ecstasy/MDMA? Never   Fall Risk Assessment   Do you feel unsteady when standing or walking? Yes   Do you worry about falling? Yes   Have you fallen in the past year? Yes   How many times have you fallen? 2 or more times   Were you ever injured from falling? Yes   Timed up and go test (in seconds) 18   Urinary Incontinence Screen   Do you ever leak urine when you don't want to? YES   OTHER   Reported to Encounter Provider Yes

## 2024-12-27 NOTE — Nursing Note (Signed)
 12/27/24 1300   Please answer the following questions on a scale of 0 to 10   Overall Pain Rating (Numeric/Faces) 5   Activity- during the past 24 hrs, pain has interfered with your usual activity 8   Sleep- during the past 24 hrs, pain has interfered with your sleep 0   Mood- during the past 24 hours, pain has affected your mood 0   Stress- during the past 24 hours, pain has contributed to your stress 0

## 2025-01-12 ENCOUNTER — Ambulatory Visit
Admission: RE | Admit: 2025-01-12 | Discharge: 2025-01-12 | Disposition: A | Discharge status: Home / Self Care | Source: Ambulatory Visit | Attending: Student in an Organized Health Care Education/Training Program | Admitting: Student in an Organized Health Care Education/Training Program

## 2025-01-12 ENCOUNTER — Other Ambulatory Visit: Payer: Self-pay

## 2025-01-12 DIAGNOSIS — M940 Chondrocostal junction syndrome [Tietze]: Secondary | ICD-10-CM | POA: Insufficient documentation

## 2025-01-12 DIAGNOSIS — R0789 Other chest pain: Secondary | ICD-10-CM | POA: Insufficient documentation

## 2025-01-13 ENCOUNTER — Ambulatory Visit (HOSPITAL_BASED_OUTPATIENT_CLINIC_OR_DEPARTMENT_OTHER): Payer: Self-pay | Admitting: Student in an Organized Health Care Education/Training Program

## 2025-01-13 NOTE — Progress Notes (Deleted)
 PAIN MANAGEMENT, [facility] OFFICE BUILDING  [address]  [place] [address]  Operated by [facility]    Name: Joanna Black MRN:  [id number]   Date: 01/17/2025 DOB:  Apr 26, 1946 (78 y.o.)       CC: No chief complaint on file.    Obtained history from: patient    SUBJECTIVE:  Joanna Black is a 79 y.o. female  who RETURNS to Pain Management for follow up evaluation.  Last seen 11/11/24 with excellent relief following L4-5 ESI per Dr. Court.  Today, she reports     5% relief which is ongoing.  Overall, is very pleased with results of injection.  Currently rates pain 3/10 and is tolerable.  Denies interval changes in health and medications.  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  Also follows with Dr. 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  As previously documented, patient has participated in conservative treatment in the past such as physical therapy and medication management without adequate relief.    Procedures:  09/23/23 Caudal ESI with catheter- No relief  02/24/24 Right L4-5, L5-S1 RFA- 70% relief ongoing  03/23/24 Left L4-5, L5-S1 RFA- No relief  06/08/24 L4-5 ESI- 95% relief x about 3 months  10/15/24 L4-5 ESI- 95% relief ongoing    Patient's past history and current information (including medical, surgical, family, social, allergy, medication) were reviewed personally today.     REVIEW OF SYSTEMS:  Other than ROS in the HPI, all other review of systems were negative except:  bladder leakage (ongoing), bowel accidents, intermittent SOB, and DM.  Denies new numbness/weakness, saddle anesthesia.    PHYSICAL EXAMINATION:    Vitals: There were no vitals taken for this visit.SABRA        GENERAL EXAMINATION:  resting in seated position. No acute distress.   SKIN:  warm and dry  EYES:  Conjunctiva clear  HEAD/EARS/NOSE/THROAT:   normocephalic, atraumatic  NECK:   symmetric, trachea midline  CHEST WALL AND LUNGS:   Unlabored breathing,  symmetrical chest excursions.  PSYCHOLOGICAL:  Affect normal. Behavior appropriate.  NEUROLOGICAL/MUSCULOSKELETAL:  Alert and oriented x3,  memory grossly intact  Visual Inspection: No appreciable muscular atrophy  Motor is 5/5 across all joints of the BLE  Sensation: normal  Coordination: Normal  Gait:Nonantalgic with use of no assistive device      RADIOLOGICAL/ LAB FINDINGS:    Lab A1C Results:  HEMOGLOBIN A1C   Date Value Ref Range Status   08/18/2023 7.1 (H) 4.8 - 6.2 % Final   01/23/2023 6.4 (H) 4.8 - 6.2 % Final   12/10/2022 7.6  Final   03/13/2022 5.9  Final   11/29/2021 6.3 (H) 4.8 - 6.2 % Final       POCT A1C Results:      03/13/2022    11:00 AM 10/03/2022     4:00 PM 12/10/2022     9:00 AM 05/25/2024    11:00 AM   POCT A1c   Time Performed   09:27 11:40   A1C 5.9 6.3 7.6 7.2         MRI SPINE LUMBOSACRAL WO CONTRAST     Collection Time: 05/21/23  1:53 PM     Narrative     Shiah R Mercer     PROCEDURE DESCRIPTION: MRI SPINE LUMBOSACRAL WO CONTRAST     CLINICAL INDICATION: R26.2: Ambulatory dysfunction     COMPARISON: No prior studies were compared.  FINDINGS: Multiplanar multisequence images lumbar spine obtained without IV contrast enhancement. There is grade 1 anterolisthesis L4 on L5. There is no bone edema. There is disc desiccation at multiple levels throughout the lumbar spine. Mild disc space narrowing is seen. Spinal cord is normal with conus terminating at L1.     There is mild disc bulging L1-2 but no significant spinal stenosis.     Mild disc bulging and degenerative facet arthropathy L2-3 without significant spinal stenosis.     Degenerative facet changes and mild disc bulging L3-4 with mild central canal stenosis and foraminal narrowing.     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     Degenerative facet changes L5-S1 with disc bulging more prominent towards  the left side. There is a central disc protrusion or herniation superimposed. There is moderate left foraminal narrowing. There is mild central canal stenosis.     Paraspinal soft tissues appear normal.     No findings to suggest infectious or inflammatory process of the lumbar spine.        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  She is advised to call with any questions or concerns.  Also advised to call if pain returns/worsens for further injection therapy.      Follows with:       Rheumatology, Dr. Froylan, Hx; polymyalgia rheumatica and giant cell arteritis     Dr. 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    Blood thinner: None  A1c per Dr. Tracie, PCP       Consider:  Repeat L4-5 ESI (rightward) at [facility].  L4-5, L5-S1 TFESIs  SIJ injection  Return to PT    Our impression, treatment recommendations and plan from today's visit were reviewed in detail with the patient in the office. All of the patients questions were answered.  The patient verbalized understanding and wishes to move forward with the above noted plan.  Patient was seen independently.

## 2025-01-14 ENCOUNTER — Ambulatory Visit (INDEPENDENT_AMBULATORY_CARE_PROVIDER_SITE_OTHER): Payer: Self-pay | Admitting: Family Medicine

## 2025-01-14 ENCOUNTER — Ambulatory Visit

## 2025-01-14 DIAGNOSIS — J069 Acute upper respiratory infection, unspecified: Secondary | ICD-10-CM

## 2025-01-14 MED ORDER — AZITHROMYCIN 250 MG TABLET: ORAL_TABLET | ORAL | 0 refills | Status: DC

## 2025-01-14 MED ORDER — BENZONATATE 200 MG CAPSULE: 200.0000 mg | ORAL_CAPSULE | Freq: Three times a day (TID) | ORAL | 3 refills | Status: AC | PRN

## 2025-01-14 MED ORDER — METHYLPREDNISOLONE 4 MG TABLETS IN A DOSE PACK: ORAL_TABLET | ORAL | 0 refills | Status: DC

## 2025-01-14 NOTE — Telephone Encounter (Signed)
 Patient called and left a message wanting to know if PCP could call in an antibiotic, she has been coughing and has a sore throat. She would like medication before bad weather comes.    Geofm Chow, [place] 01/14/2025 09:19

## 2025-01-14 NOTE — Telephone Encounter (Signed)
 Called and informed patient of doctors notes, patient voiced understanding.    Geofm Chow, [place] 01/14/2025 10:14

## 2025-01-14 NOTE — Telephone Encounter (Signed)
 Please make patient aware order placed for Abx. and steroids.

## 2025-01-17 ENCOUNTER — Ambulatory Visit (INDEPENDENT_AMBULATORY_CARE_PROVIDER_SITE_OTHER): Payer: Self-pay | Admitting: Neurological Surgery

## 2025-01-18 ENCOUNTER — Other Ambulatory Visit: Payer: Self-pay

## 2025-01-18 ENCOUNTER — Encounter (HOSPITAL_COMMUNITY): Payer: Self-pay

## 2025-01-18 ENCOUNTER — Emergency Department
Admission: EM | Admit: 2025-01-18 | Discharge: 2025-01-18 | Disposition: A | Discharge status: Home / Self Care | Attending: Emergency Medicine | Admitting: Emergency Medicine

## 2025-01-18 DIAGNOSIS — J101 Influenza due to other identified influenza virus with other respiratory manifestations: Secondary | ICD-10-CM | POA: Insufficient documentation

## 2025-01-18 DIAGNOSIS — J441 Chronic obstructive pulmonary disease with (acute) exacerbation: Secondary | ICD-10-CM | POA: Insufficient documentation

## 2025-01-18 LAB — COVID-19, FLU A/B, RSV RAPID BY PCR
INFLUENZA VIRUS TYPE A: DETECTED — AB
INFLUENZA VIRUS TYPE B: NOT DETECTED
RESPIRATORY SYNCYTIAL VIRUS (RSV): NOT DETECTED
SARS-CoV-2: NOT DETECTED

## 2025-01-18 MED ORDER — DOXYCYCLINE HYCLATE 100 MG CAPSULE: 100.0000 mg | ORAL_CAPSULE | Freq: Two times a day (BID) | ORAL | 0 refills | Status: AC

## 2025-01-18 MED ORDER — CEFDINIR 300 MG CAPSULE: 300.0000 mg | ORAL_CAPSULE | Freq: Two times a day (BID) | ORAL | 0 refills | Status: AC

## 2025-01-18 MED ORDER — LIDOCAINE (PF) 10 MG/ML (1 %) INJECTION SOLUTION
1.0000 g | INTRAMUSCULAR | Status: AC
  Administered 2025-01-18: 1 g via INTRAMUSCULAR
  Filled 2025-01-18: qty 10

## 2025-01-18 MED ORDER — OSELTAMIVIR 75 MG CAPSULE
75.0000 mg | ORAL_CAPSULE | ORAL | Status: AC
  Administered 2025-01-18: 75 mg via ORAL
  Filled 2025-01-18: qty 1

## 2025-01-18 MED ORDER — METHYLPREDNISOLONE 4 MG TABLETS IN A DOSE PACK: ORAL_TABLET | ORAL | 0 refills | Status: AC

## 2025-01-18 MED ORDER — IPRATROPIUM 0.5 MG-ALBUTEROL 3 MG (2.5 MG BASE)/3 ML NEBULIZATION SOLN
3.0000 mL | INHALATION_SOLUTION | RESPIRATORY_TRACT | Status: AC
  Administered 2025-01-18: 3 mL via RESPIRATORY_TRACT
  Filled 2025-01-18: qty 3

## 2025-01-18 MED ORDER — BUDESONIDE 0.5 MG/2 ML SUSPENSION FOR NEBULIZATION
0.5000 mg | INHALATION_SUSPENSION | Freq: Once | RESPIRATORY_TRACT | Status: AC
  Administered 2025-01-18: 0.5 mg via RESPIRATORY_TRACT
  Filled 2025-01-18: qty 2

## 2025-01-18 MED ORDER — DEXAMETHASONE SODIUM PHOSPHATE (PF) 10 MG/ML INJECTION SOLUTION
10.0000 mg | INTRAMUSCULAR | Status: AC
  Administered 2025-01-18: 10 mg via INTRAMUSCULAR
  Filled 2025-01-18: qty 1

## 2025-01-18 MED ORDER — OSELTAMIVIR 75 MG CAPSULE: 75.0000 mg | ORAL_CAPSULE | Freq: Two times a day (BID) | ORAL | 0 refills | Status: AC

## 2025-01-18 NOTE — Discharge Instructions (Addendum)
 THE PATIENT IS TO FOLLOW UP WITH THE PRIMARY CARE PROVIDER AS SOON AS POSSIBLE BUT NO LATER THAN 3 DAYS FROM LEAVING THE EMERGENCY DEPARTMENT.  IF NO PRIMARY CARE PROVIDER EXISTS, THEN THE PATIENT IS INSTRUCTED TO ESTABLISH CARE WITH A PRIMARY CARE PROVIDER AS SOON AS POSSIBLE BUT NO LATER THAN 3 DAYS FROM LEAVING THE EMERGENCY DEPARTMENT.  FOLLOW-UP WITH ANY SPECIALIST PROVIDER AS INDICATED AS SOON AS POSSIBLE BUT NO LATER THAN 3 DAYS, IF APPLICABLE.  NOTIFY THE PRIMARY CARE PROVIDER THAT YOU WERE IN THE EMERGENCY DEPARTMENT WITHIN 24 HOURS OF DISCHARGE TO FOLLOW-UP ON YOUR RESULTS AND/OR TREATMENTS.  RETURN TO THE EMERGENCY DEPARTMENT IMMEDIATELY IF NEEDED, NO BETTER, WORSE, NEW SYMPTOMS ARISE, OR YOU CANNOT FOLLOW-UP WITH YOUR PRIMARY CARE PROVIDER AND/OR APPLICABLE SPECIALIST IN THE PRESCRIBED TIMEFRAME.

## 2025-01-18 NOTE — ED Triage Notes (Signed)
 Pt states she's been coughing with congestion in her chest since Friday. I have been having a hard time breathing.

## 2025-01-18 NOTE — ED Provider Notes (Signed)
 Emergency Department  Attending Provider Note      CHIEF COMPLAINT  Chief Complaint   Patient presents with    Flu Like Symptoms     Pt states she's been coughing with congestion in her chest since Friday. I have been having a hard time breathing.     HISTORY OF PRESENT ILLNESS  Joanna Black, date of birth 08-Nov-1946, is a 79 y.o. female who presented to the Emergency Department.    PATIENT PRESENTS EMERGENCY DEPARTMENT TODAY WITH A STATED COMPLAINT OF SOME FLU-LIKE SYMPTOMS WITH COUGHING AND CONGESTION FOR LAST 3 DAYS OR SO.  HISTORY OF CHRONIC OBSTRUCTIVE PULMONARY DISEASE.  NOTHING REPORTED BETTER EXACERBATE OVERALL MILD TO TIMES MODERATE CONDITION.  NO FEVERS CHILLS DYSURIA HEMATURIA.  SOME NAUSEA BUT NO VOMITING.  NO CHEST PAIN OR PALPITATIONS.  A COMPREHENSIVE 10+ REVIEW OF SYSTEMS OTHERWISE NEGATIVE.  NO OTHER ACUTE COMPLAINTS REPORTED TO ME BY THE PATIENT.    PAST MEDICAL/SURGICAL/FAMILY/SOCIAL HISTORY  Past Medical History:   Diagnosis Date    Arthritis     Asthma     Back problem     BMI 35.0-35.9,adult 02/16/2020    CAD (coronary artery disease)     mild    Cataract     Cataracts, bilateral     CHF (congestive heart failure)     Chronic bronchitis with emphysema     Chronic low back pain     Claustrophobia     Depression     Diabetes     Diabetes mellitus, type 2     Encounter for support and coordination of transition of care 11/16/2022    Esophageal reflux     Essential hypertension 01/28/2019    H/O urinary tract infection     HH (hiatus hernia)     History of kidney disease     states, low kidney functions.    Hypercholesterolemia     Hypertension     Hypertriglyceridemia     Polymyalgia rheumatica (CMS HCC)     Type 2 diabetes mellitus        Past Surgical History:   Procedure Laterality Date    CATARACT EXTRACTION, BILATERAL Bilateral     bilateral lens implant    COLONOSCOPY      ENDOSCOPIC ULTRASOUND ESOPHGEAL  04/15/2023    ESOPHAGOGASTRODUODENOSCOPY      HX CARPAL TUNNEL RELEASE      HX  CATARACT REMOVAL Right 12/30/2019    HX CATARACT REMOVAL Left 01/06/2020    HX CHOLECYSTECTOMY      HX EXPOSURE TO METAL SHAVINGS      HX HEART CATHETERIZATION      HX HYSTERECTOMY      HX OOPHORECTOMY      HX TAH AND BSO      HX TUBAL LIGATION      HX VEIN STRIPPING      Left leg       Family Medical History:       Problem Relation (Age of Onset)    Bone cancer Father    Diabetes Multiple family members    Hypertension (High Blood Pressure) Multiple family members    No Known Problems Mother    Stomach Cancer Paternal Grandmother          Social History     Socioeconomic History    Marital status: Married     Spouse name: [name]    Number of children: 3    Years of education: 13    Highest  education level: High school graduate   Occupational History    Occupation: Retired   Tobacco Use    Smoking status: Never     Passive exposure: Never    Smokeless tobacco: Never   Vaping Use    Vaping status: Never Used   Substance and Sexual Activity    Alcohol use: No     Alcohol/week: 0.0 standard drinks of alcohol    Drug use: No    Sexual activity: Not Currently     Partners: Male   Other Topics Concern    Right hand dominant Yes    Ability to Walk 1 Flight of Steps without SOB/CP No    Ability To Do Own ADL's Yes   Social History Narrative    MARRIED.  Lives in single story home.  Heats home with electric and has well water .        April Haney, LPN  7/75/7978, 14:01     Social Determinants of Health     Financial Resource Strain: Low Risk (12/27/2024)    Financial Resource Strain     SDOH Financial: No   Transportation Needs: Low Risk (12/27/2024)    Transportation Needs     SDOH Transportation: No   Social Connections: Low Risk (12/27/2024)    Social Connections     SDOH Social Isolation: 5 or more times a week   Intimate Partner Violence: Low Risk (12/27/2024)    Intimate Partner Violence     SDOH Domestic Violence: No   Housing Stability: Low Risk (12/27/2024)    Housing Stability     SDOH Housing Situation: I have housing.      SDOH Housing Worry: No      ALLERGIES  Allergies[1]    PHYSICAL EXAM  VITAL SIGNS:  Filed Vitals:    01/18/25 1209 01/18/25 1213 01/18/25 1408   BP: 138/80  132/74   Pulse: 82  81   Resp: (!) 22 (!) 22 18   Temp: 36.6 C (97.9 F)  36.3 C (97.3 F)   SpO2: 91%  91%     GENERAL: PATIENT IS ALERT AND ORIENTED TO PERSON, PLACE, AND TIME.  HEAD: NORMOCEPHALIC AND ATRAUMATIC.  EYES: PUPILS EQUALLY ROUND AND REACT TO LIGHT. EXTRAOCULAR MOVEMENTS INTACT.  EARS: GROSS HEARING INTACT. EXTERNAL EARS WITHIN NORMAL LIMITS.  NOSE: NO SEPTAL DEVIATION. NASAL PASSAGES CLEAR.  THROAT: MOIST ORAL MUCOSA.   NECK: SUPPLE. TRACHEA MIDLINE.  HEART: REGULAR, RATE, AND RHYTHM.  LUNGS:  DECREASED BILATERAL BREATH SOUNDS WITH FAINT SCATTERED WHEEZE.  ABDOMEN: SOFT, NON-TENDER, NON-DISTENDED, AND BOWEL SOUNDS ARE PRESENT.  GENITOURINARY: DEFERRED.  RECTAL: DEFERRED.  EXTREMITIES: NO CYANOSIS, CLUBBING, OR EDEMA.  SKIN: WARM AND DRY.  MUSCULOSKELETAL: DEFERRED.  NEUROLOGIC: CRANIAL NERVES II THROUGH XII ARE GROSSLY INTACT. SENSATION TO LIGHT TOUCH IS INTACT.  PSYCHIATRIC: JUDGMENT AND INSIGHT ARE SEEMINGLY INTACT. MOOD AND AFFECT ARE APPROPRIATE FOR THE SITUATION.    DIAGNOSTICS  Labs:  Labs listed below were reviewed and interpreted by me.  Results for orders placed or performed during the hospital encounter of 01/18/25   COVID-19, FLU A/B, RSV RAPID BY PCR   Result Value Ref Range    SARS-CoV-2 Not Detected Not Detected    INFLUENZA VIRUS TYPE A Detected (A) Not Detected    INFLUENZA VIRUS TYPE B Not Detected Not Detected    RESPIRATORY SYNCYTIAL VIRUS (RSV) Not Detected Not Detected     Radiology:       ED COURSE/MEDICAL DECISION MAKING  Medications Ordered/Administered in the  ED   cefTRIAXone  (ROCEPHIN ) 1 g in lidocaine  (PF) 1% (10 mg/mL) 2.86 mL IM injection (1 g IntraMUSCULAR Given 01/18/25 1344)   dexAMETHasone  (PF) 10 mg/mL injection (10 mg IntraMUSCULAR Given 01/18/25 1339)   ipratropium-albuterol  0.5 mg-3 mg(2.5 mg base)/3 mL  Solution for Nebulization (3 mL Nebulization Given 01/18/25 1330)   budesonide  (PULMICORT  RESPULES) 0.5 mg/2 mL nebulizer suspension (0.5 mg Nebulization Given 01/18/25 1330)   oseltamivir  (TAMIFLU ) capsule (75 mg Oral Given 01/18/25 1341)      ED Course as of 01/20/25 2343   Tue Jan 18, 2025   1304 INFLUENZA VIRUS TYPE A(!): Detected  ABNORMAL   1304 SARS CORONAVIRUS 2 (SARS-CoV-2): Not Detected  WNL   1304 RSV PCR: Not Detected  WNL      Medical Decision Making  Problems Addressed:  COPD with acute exacerbation (CMS HCC): acute illness or injury  Influenza A: acute illness or injury    Amount and/or Complexity of Data Reviewed  Labs: ordered. Decision-making details documented in ED Course.    Risk  Prescription drug management.  Diagnosis or treatment significantly limited by social determinants of health.      CLINICAL IMPRESSION  Clinical Impression   Influenza A (Primary)   COPD with acute exacerbation (CMS HCC)     DISPOSITION  Discharged       DISCHARGE MEDICATIONS  Discharge Medication List as of 01/18/2025  1:22 PM        START taking these medications    Details   cefdinir  (OMNICEF ) 300 mg Oral Capsule Take 1 Capsule (300 mg total) by mouth Twice daily for 10 days, Disp-20 Capsule, R-0, E-Rx      doxycycline  hyclate (VIBRAMYCIN ) 100 mg Oral Capsule Take 1 Capsule (100 mg total) by mouth Twice daily for 10 days, Disp-20 Capsule, R-0, E-Rx      oseltamivir  (TAMIFLU ) 75 mg Oral Capsule Take 1 Capsule (75 mg total) by mouth Twice daily for 5 days, Disp-10 Capsule, R-0, E-Rx             /Mikaylee Arseneau A. Nicholaus HAS, MBA, MHA, CPE, [place], [place], [facility]   01/18/2025, 13:22   Department of Emergency Medicine  [facility]  [place]    This note was partially generated using MModal Fluency Direct system, and there may be some incorrect words, spellings, and punctuation that were not noted in checking the note before saving.                 [1]   Allergies  Allergen Reactions    Amoxicillin  Other Adverse Reaction (Add  comment)     Unknown reaction    Augmentin [Amoxicillin-Pot Clavulanate] Nausea/ Vomiting    Statins-Hmg-Coa Reductase Inhibitors Myalgia

## 2025-01-18 NOTE — ED Nurses Note (Signed)
 Reviewed discharge instructions and medications with pt. Pt had no questions and states understanding. Pt ambulated with no difficulty and declined a wheelchair, pt walked to waiting room/vehicle.

## 2025-01-19 ENCOUNTER — Telehealth (HOSPITAL_COMMUNITY): Payer: Self-pay | Admitting: Family Medicine

## 2025-01-19 NOTE — Progress Notes (Signed)
 01/19/25-0922-   Post Ed Follow-Up    Post ED Follow-Up:   Document completed and/or attempted interactive contact(s) after transition to home after emergency department stay.:   Transition Facility and relevant Date:   Discharge Date: 01/18/25  Discharge from [facility] Emergency Department?: Yes  Discharge Facility: [facility]  Contacted by: Carle Jansky RN BSN  Contact method: Patient/Caregiver Telephone, MyChart Patient Portal  Contact first attempt: 01/19/2025  9:21 AM  Contact second attempt: 01/19/2025  9:22 AM  MyChart message sent?: Yes  Follow Up Visit: No answer/Unable to leave message  01/19/25-0921- Carle Jansky RN BSN

## 2025-01-27 ENCOUNTER — Ambulatory Visit (INDEPENDENT_AMBULATORY_CARE_PROVIDER_SITE_OTHER): Payer: Self-pay | Admitting: Family Medicine

## 2025-01-27 DIAGNOSIS — J111 Influenza due to unidentified influenza virus with other respiratory manifestations: Secondary | ICD-10-CM

## 2025-01-27 MED ORDER — OSELTAMIVIR 75 MG CAPSULE: 75.0000 mg | ORAL_CAPSULE | Freq: Two times a day (BID) | ORAL | 0 refills | Status: AC

## 2025-01-27 MED ORDER — AZITHROMYCIN 250 MG TABLET: ORAL_TABLET | ORAL | 0 refills | Status: AC

## 2025-01-27 NOTE — Telephone Encounter (Signed)
 Patient called and said that she had tested positive for the flu and tried taking doxycycline  and cefdinir  but it made patient sick and she can't take it. Patient stated she is still coughing and wanted to know if PCP could call in another antibiotic.     Geofm Chow, [place] 01/27/2025 09:34

## 2025-01-27 NOTE — Telephone Encounter (Signed)
 Please let patient know orders been placed for Z-Pak along with Tamiflu .  Patient to continue supportive care and stay hydrated.  Patient to contact office within the next week for update on her condition.  Thank you.

## 2025-01-27 NOTE — Telephone Encounter (Signed)
 Called and informed patient of doctors notes, patient voiced understanding.    Geofm Chow, [place] 01/27/2025 09:45

## 2025-03-01 ENCOUNTER — Ambulatory Visit: Admit: 2025-03-01 | Admitting: Student in an Organized Health Care Education/Training Program

## 2025-03-11 ENCOUNTER — Ambulatory Visit: Admitting: Student in an Organized Health Care Education/Training Program

## 2025-04-01 ENCOUNTER — Encounter (HOSPITAL_COMMUNITY): Payer: Self-pay

## 2025-04-27 ENCOUNTER — Ambulatory Visit (INDEPENDENT_AMBULATORY_CARE_PROVIDER_SITE_OTHER): Payer: Self-pay | Admitting: Family Medicine

## 2025-06-14 ENCOUNTER — Ambulatory Visit (HOSPITAL_COMMUNITY): Payer: Self-pay | Admitting: Physician Assistant

## 2025-07-18 ENCOUNTER — Ambulatory Visit (HOSPITAL_BASED_OUTPATIENT_CLINIC_OR_DEPARTMENT_OTHER): Payer: Self-pay | Admitting: Student in an Organized Health Care Education/Training Program
